# Patient Record
Sex: Female | Born: 1940 | Race: White | Hispanic: No | Smoking: Never smoker
Health system: Southern US, Community
[De-identification: ages and names within clinical notes are randomized; demographics above are authoritative.]

## PROBLEM LIST (undated history)

## (undated) DIAGNOSIS — R112 Nausea with vomiting, unspecified: Secondary | ICD-10-CM

## (undated) DIAGNOSIS — T4145XA Adverse effect of unspecified anesthetic, initial encounter: Secondary | ICD-10-CM

## (undated) DIAGNOSIS — Z8489 Family history of other specified conditions: Secondary | ICD-10-CM

## (undated) DIAGNOSIS — C541 Malignant neoplasm of endometrium: Secondary | ICD-10-CM

## (undated) DIAGNOSIS — E039 Hypothyroidism, unspecified: Secondary | ICD-10-CM

## (undated) DIAGNOSIS — E079 Disorder of thyroid, unspecified: Secondary | ICD-10-CM

## (undated) DIAGNOSIS — T8859XA Other complications of anesthesia, initial encounter: Secondary | ICD-10-CM

## (undated) DIAGNOSIS — G709 Myoneural disorder, unspecified: Secondary | ICD-10-CM

## (undated) DIAGNOSIS — C801 Malignant (primary) neoplasm, unspecified: Secondary | ICD-10-CM

## (undated) DIAGNOSIS — Z9889 Other specified postprocedural states: Secondary | ICD-10-CM

## (undated) DIAGNOSIS — K219 Gastro-esophageal reflux disease without esophagitis: Secondary | ICD-10-CM

## (undated) DIAGNOSIS — M1990 Unspecified osteoarthritis, unspecified site: Secondary | ICD-10-CM

## (undated) DIAGNOSIS — H409 Unspecified glaucoma: Secondary | ICD-10-CM

## (undated) HISTORY — PX: CHOLECYSTECTOMY: SHX55

## (undated) HISTORY — PX: ABDOMINAL HYSTERECTOMY: SHX81

## (undated) HISTORY — DX: Unspecified glaucoma: H40.9

## (undated) HISTORY — PX: BREAST EXCISIONAL BIOPSY: SUR124

## (undated) HISTORY — PX: APPENDECTOMY: SHX54

## (undated) HISTORY — DX: Disorder of thyroid, unspecified: E07.9

## (undated) HISTORY — PX: BREAST SURGERY: SHX581

## (undated) HISTORY — DX: Unspecified osteoarthritis, unspecified site: M19.90

## (undated) HISTORY — DX: Malignant (primary) neoplasm, unspecified: C80.1

---
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
## 2001-02-05 HISTORY — PX: ROTATOR CUFF REPAIR: SHX139

## 2013-06-05 HISTORY — PX: FEMUR SURGERY: SHX943

## 2014-09-29 ENCOUNTER — Encounter: Payer: Self-pay | Admitting: Family Medicine

## 2014-12-13 ENCOUNTER — Encounter: Payer: Self-pay | Admitting: Family Medicine

## 2015-02-21 DIAGNOSIS — I10 Essential (primary) hypertension: Secondary | ICD-10-CM | POA: Diagnosis not present

## 2015-03-24 DIAGNOSIS — M79675 Pain in left toe(s): Secondary | ICD-10-CM | POA: Diagnosis not present

## 2015-03-24 DIAGNOSIS — I70203 Unspecified atherosclerosis of native arteries of extremities, bilateral legs: Secondary | ICD-10-CM | POA: Diagnosis not present

## 2015-03-24 DIAGNOSIS — L84 Corns and callosities: Secondary | ICD-10-CM | POA: Diagnosis not present

## 2015-03-24 DIAGNOSIS — L600 Ingrowing nail: Secondary | ICD-10-CM | POA: Diagnosis not present

## 2015-03-24 DIAGNOSIS — M79674 Pain in right toe(s): Secondary | ICD-10-CM | POA: Diagnosis not present

## 2015-03-24 DIAGNOSIS — L03032 Cellulitis of left toe: Secondary | ICD-10-CM | POA: Diagnosis not present

## 2015-03-24 DIAGNOSIS — B351 Tinea unguium: Secondary | ICD-10-CM | POA: Diagnosis not present

## 2015-05-19 DIAGNOSIS — K1370 Unspecified lesions of oral mucosa: Secondary | ICD-10-CM | POA: Diagnosis not present

## 2015-05-24 DIAGNOSIS — Z9181 History of falling: Secondary | ICD-10-CM | POA: Diagnosis not present

## 2015-05-24 DIAGNOSIS — E785 Hyperlipidemia, unspecified: Secondary | ICD-10-CM | POA: Diagnosis not present

## 2015-05-24 DIAGNOSIS — R197 Diarrhea, unspecified: Secondary | ICD-10-CM | POA: Diagnosis not present

## 2015-05-24 DIAGNOSIS — I10 Essential (primary) hypertension: Secondary | ICD-10-CM | POA: Diagnosis not present

## 2015-05-26 ENCOUNTER — Encounter: Payer: Self-pay | Admitting: Family Medicine

## 2015-05-26 DIAGNOSIS — L84 Corns and callosities: Secondary | ICD-10-CM | POA: Diagnosis not present

## 2015-05-26 DIAGNOSIS — B351 Tinea unguium: Secondary | ICD-10-CM | POA: Diagnosis not present

## 2015-05-26 DIAGNOSIS — I70203 Unspecified atherosclerosis of native arteries of extremities, bilateral legs: Secondary | ICD-10-CM | POA: Diagnosis not present

## 2015-05-26 DIAGNOSIS — M79675 Pain in left toe(s): Secondary | ICD-10-CM | POA: Diagnosis not present

## 2015-05-26 DIAGNOSIS — M79674 Pain in right toe(s): Secondary | ICD-10-CM | POA: Diagnosis not present

## 2015-05-26 DIAGNOSIS — L03032 Cellulitis of left toe: Secondary | ICD-10-CM | POA: Diagnosis not present

## 2015-05-26 DIAGNOSIS — L600 Ingrowing nail: Secondary | ICD-10-CM | POA: Diagnosis not present

## 2015-05-26 DIAGNOSIS — I10 Essential (primary) hypertension: Secondary | ICD-10-CM | POA: Diagnosis not present

## 2015-06-02 DIAGNOSIS — H401132 Primary open-angle glaucoma, bilateral, moderate stage: Secondary | ICD-10-CM | POA: Diagnosis not present

## 2015-07-08 DIAGNOSIS — K1321 Leukoplakia of oral mucosa, including tongue: Secondary | ICD-10-CM | POA: Diagnosis not present

## 2015-07-08 DIAGNOSIS — L439 Lichen planus, unspecified: Secondary | ICD-10-CM | POA: Diagnosis not present

## 2015-07-14 DIAGNOSIS — Y842 Radiological procedure and radiotherapy as the cause of abnormal reaction of the patient, or of later complication, without mention of misadventure at the time of the procedure: Secondary | ICD-10-CM | POA: Diagnosis not present

## 2015-07-14 DIAGNOSIS — N952 Postmenopausal atrophic vaginitis: Secondary | ICD-10-CM | POA: Diagnosis not present

## 2015-07-14 DIAGNOSIS — Z8542 Personal history of malignant neoplasm of other parts of uterus: Secondary | ICD-10-CM | POA: Diagnosis not present

## 2015-07-14 DIAGNOSIS — N393 Stress incontinence (female) (male): Secondary | ICD-10-CM | POA: Diagnosis not present

## 2015-07-18 DIAGNOSIS — Z803 Family history of malignant neoplasm of breast: Secondary | ICD-10-CM | POA: Diagnosis not present

## 2015-07-18 DIAGNOSIS — Z1231 Encounter for screening mammogram for malignant neoplasm of breast: Secondary | ICD-10-CM | POA: Diagnosis not present

## 2015-07-29 DIAGNOSIS — Z13820 Encounter for screening for osteoporosis: Secondary | ICD-10-CM | POA: Diagnosis not present

## 2015-07-29 DIAGNOSIS — M810 Age-related osteoporosis without current pathological fracture: Secondary | ICD-10-CM | POA: Diagnosis not present

## 2015-08-02 DIAGNOSIS — L03032 Cellulitis of left toe: Secondary | ICD-10-CM | POA: Diagnosis not present

## 2015-08-02 DIAGNOSIS — L84 Corns and callosities: Secondary | ICD-10-CM | POA: Diagnosis not present

## 2015-08-02 DIAGNOSIS — I70203 Unspecified atherosclerosis of native arteries of extremities, bilateral legs: Secondary | ICD-10-CM | POA: Diagnosis not present

## 2015-08-02 DIAGNOSIS — B351 Tinea unguium: Secondary | ICD-10-CM | POA: Diagnosis not present

## 2015-08-02 DIAGNOSIS — M79675 Pain in left toe(s): Secondary | ICD-10-CM | POA: Diagnosis not present

## 2015-08-02 DIAGNOSIS — L600 Ingrowing nail: Secondary | ICD-10-CM | POA: Diagnosis not present

## 2015-08-02 DIAGNOSIS — M79674 Pain in right toe(s): Secondary | ICD-10-CM | POA: Diagnosis not present

## 2015-08-16 ENCOUNTER — Encounter: Payer: Self-pay | Admitting: Family Medicine

## 2015-08-16 DIAGNOSIS — C569 Malignant neoplasm of unspecified ovary: Secondary | ICD-10-CM | POA: Diagnosis not present

## 2015-08-16 DIAGNOSIS — E785 Hyperlipidemia, unspecified: Secondary | ICD-10-CM | POA: Diagnosis not present

## 2015-08-16 DIAGNOSIS — E039 Hypothyroidism, unspecified: Secondary | ICD-10-CM | POA: Diagnosis not present

## 2015-08-23 DIAGNOSIS — E039 Hypothyroidism, unspecified: Secondary | ICD-10-CM | POA: Diagnosis not present

## 2015-08-23 DIAGNOSIS — E785 Hyperlipidemia, unspecified: Secondary | ICD-10-CM | POA: Diagnosis not present

## 2015-08-23 DIAGNOSIS — I10 Essential (primary) hypertension: Secondary | ICD-10-CM | POA: Diagnosis not present

## 2015-09-07 DIAGNOSIS — Z23 Encounter for immunization: Secondary | ICD-10-CM | POA: Diagnosis not present

## 2015-10-04 DIAGNOSIS — M79674 Pain in right toe(s): Secondary | ICD-10-CM | POA: Diagnosis not present

## 2015-10-04 DIAGNOSIS — L600 Ingrowing nail: Secondary | ICD-10-CM | POA: Diagnosis not present

## 2015-10-04 DIAGNOSIS — I70203 Unspecified atherosclerosis of native arteries of extremities, bilateral legs: Secondary | ICD-10-CM | POA: Diagnosis not present

## 2015-10-04 DIAGNOSIS — L84 Corns and callosities: Secondary | ICD-10-CM | POA: Diagnosis not present

## 2015-10-04 DIAGNOSIS — B351 Tinea unguium: Secondary | ICD-10-CM | POA: Diagnosis not present

## 2015-10-04 DIAGNOSIS — M79675 Pain in left toe(s): Secondary | ICD-10-CM | POA: Diagnosis not present

## 2015-10-04 DIAGNOSIS — L03032 Cellulitis of left toe: Secondary | ICD-10-CM | POA: Diagnosis not present

## 2015-10-19 DIAGNOSIS — M1711 Unilateral primary osteoarthritis, right knee: Secondary | ICD-10-CM | POA: Diagnosis not present

## 2015-10-26 DIAGNOSIS — M170 Bilateral primary osteoarthritis of knee: Secondary | ICD-10-CM | POA: Diagnosis not present

## 2015-11-02 DIAGNOSIS — M1711 Unilateral primary osteoarthritis, right knee: Secondary | ICD-10-CM | POA: Diagnosis not present

## 2015-11-04 DIAGNOSIS — C541 Malignant neoplasm of endometrium: Secondary | ICD-10-CM | POA: Diagnosis not present

## 2015-11-04 DIAGNOSIS — Z8542 Personal history of malignant neoplasm of other parts of uterus: Secondary | ICD-10-CM | POA: Diagnosis not present

## 2015-11-17 DIAGNOSIS — H524 Presbyopia: Secondary | ICD-10-CM | POA: Diagnosis not present

## 2015-11-17 DIAGNOSIS — H401132 Primary open-angle glaucoma, bilateral, moderate stage: Secondary | ICD-10-CM | POA: Diagnosis not present

## 2015-11-17 DIAGNOSIS — H2513 Age-related nuclear cataract, bilateral: Secondary | ICD-10-CM | POA: Diagnosis not present

## 2015-11-23 ENCOUNTER — Encounter: Payer: Self-pay | Admitting: Family Medicine

## 2015-11-23 DIAGNOSIS — I10 Essential (primary) hypertension: Secondary | ICD-10-CM | POA: Diagnosis not present

## 2015-11-30 DIAGNOSIS — M545 Low back pain: Secondary | ICD-10-CM | POA: Diagnosis not present

## 2015-11-30 DIAGNOSIS — E039 Hypothyroidism, unspecified: Secondary | ICD-10-CM | POA: Diagnosis not present

## 2015-11-30 DIAGNOSIS — Z8543 Personal history of malignant neoplasm of ovary: Secondary | ICD-10-CM | POA: Diagnosis not present

## 2015-11-30 DIAGNOSIS — E785 Hyperlipidemia, unspecified: Secondary | ICD-10-CM | POA: Diagnosis not present

## 2015-11-30 DIAGNOSIS — I10 Essential (primary) hypertension: Secondary | ICD-10-CM | POA: Diagnosis not present

## 2015-12-06 DIAGNOSIS — L84 Corns and callosities: Secondary | ICD-10-CM | POA: Diagnosis not present

## 2015-12-06 DIAGNOSIS — M79675 Pain in left toe(s): Secondary | ICD-10-CM | POA: Diagnosis not present

## 2015-12-06 DIAGNOSIS — L600 Ingrowing nail: Secondary | ICD-10-CM | POA: Diagnosis not present

## 2015-12-06 DIAGNOSIS — L03032 Cellulitis of left toe: Secondary | ICD-10-CM | POA: Diagnosis not present

## 2015-12-06 DIAGNOSIS — I70203 Unspecified atherosclerosis of native arteries of extremities, bilateral legs: Secondary | ICD-10-CM | POA: Diagnosis not present

## 2015-12-06 DIAGNOSIS — B351 Tinea unguium: Secondary | ICD-10-CM | POA: Diagnosis not present

## 2015-12-06 DIAGNOSIS — M79674 Pain in right toe(s): Secondary | ICD-10-CM | POA: Diagnosis not present

## 2015-12-13 DIAGNOSIS — H524 Presbyopia: Secondary | ICD-10-CM | POA: Diagnosis not present

## 2015-12-13 DIAGNOSIS — H2513 Age-related nuclear cataract, bilateral: Secondary | ICD-10-CM | POA: Diagnosis not present

## 2015-12-13 DIAGNOSIS — H401133 Primary open-angle glaucoma, bilateral, severe stage: Secondary | ICD-10-CM | POA: Diagnosis not present

## 2016-01-03 DIAGNOSIS — L439 Lichen planus, unspecified: Secondary | ICD-10-CM | POA: Diagnosis not present

## 2016-01-03 DIAGNOSIS — K1321 Leukoplakia of oral mucosa, including tongue: Secondary | ICD-10-CM | POA: Diagnosis not present

## 2016-02-14 DIAGNOSIS — L84 Corns and callosities: Secondary | ICD-10-CM | POA: Diagnosis not present

## 2016-02-14 DIAGNOSIS — I70203 Unspecified atherosclerosis of native arteries of extremities, bilateral legs: Secondary | ICD-10-CM | POA: Diagnosis not present

## 2016-02-14 DIAGNOSIS — L03032 Cellulitis of left toe: Secondary | ICD-10-CM | POA: Diagnosis not present

## 2016-02-14 DIAGNOSIS — M79674 Pain in right toe(s): Secondary | ICD-10-CM | POA: Diagnosis not present

## 2016-02-14 DIAGNOSIS — B351 Tinea unguium: Secondary | ICD-10-CM | POA: Diagnosis not present

## 2016-02-14 DIAGNOSIS — M79675 Pain in left toe(s): Secondary | ICD-10-CM | POA: Diagnosis not present

## 2016-02-14 DIAGNOSIS — L600 Ingrowing nail: Secondary | ICD-10-CM | POA: Diagnosis not present

## 2016-02-23 ENCOUNTER — Encounter: Payer: Self-pay | Admitting: Family Medicine

## 2016-02-23 DIAGNOSIS — I10 Essential (primary) hypertension: Secondary | ICD-10-CM | POA: Diagnosis not present

## 2016-03-01 ENCOUNTER — Encounter: Payer: Self-pay | Admitting: Family Medicine

## 2016-03-01 DIAGNOSIS — I10 Essential (primary) hypertension: Secondary | ICD-10-CM | POA: Diagnosis not present

## 2016-03-01 DIAGNOSIS — M546 Pain in thoracic spine: Secondary | ICD-10-CM | POA: Diagnosis not present

## 2016-03-01 DIAGNOSIS — Z8543 Personal history of malignant neoplasm of ovary: Secondary | ICD-10-CM | POA: Diagnosis not present

## 2016-03-01 DIAGNOSIS — M545 Low back pain: Secondary | ICD-10-CM | POA: Diagnosis not present

## 2016-03-01 DIAGNOSIS — E785 Hyperlipidemia, unspecified: Secondary | ICD-10-CM | POA: Diagnosis not present

## 2016-03-01 DIAGNOSIS — M542 Cervicalgia: Secondary | ICD-10-CM | POA: Diagnosis not present

## 2016-03-01 DIAGNOSIS — M4313 Spondylolisthesis, cervicothoracic region: Secondary | ICD-10-CM | POA: Diagnosis not present

## 2016-03-01 DIAGNOSIS — M5185 Other intervertebral disc disorders, thoracolumbar region: Secondary | ICD-10-CM | POA: Diagnosis not present

## 2016-03-01 DIAGNOSIS — E039 Hypothyroidism, unspecified: Secondary | ICD-10-CM | POA: Diagnosis not present

## 2016-03-01 DIAGNOSIS — M549 Dorsalgia, unspecified: Secondary | ICD-10-CM | POA: Diagnosis not present

## 2016-04-17 DIAGNOSIS — L84 Corns and callosities: Secondary | ICD-10-CM | POA: Diagnosis not present

## 2016-04-17 DIAGNOSIS — I70203 Unspecified atherosclerosis of native arteries of extremities, bilateral legs: Secondary | ICD-10-CM | POA: Diagnosis not present

## 2016-04-17 DIAGNOSIS — L600 Ingrowing nail: Secondary | ICD-10-CM | POA: Diagnosis not present

## 2016-04-17 DIAGNOSIS — L03032 Cellulitis of left toe: Secondary | ICD-10-CM | POA: Diagnosis not present

## 2016-04-17 DIAGNOSIS — M79674 Pain in right toe(s): Secondary | ICD-10-CM | POA: Diagnosis not present

## 2016-04-17 DIAGNOSIS — B351 Tinea unguium: Secondary | ICD-10-CM | POA: Diagnosis not present

## 2016-04-17 DIAGNOSIS — M79675 Pain in left toe(s): Secondary | ICD-10-CM | POA: Diagnosis not present

## 2016-04-23 DIAGNOSIS — M1711 Unilateral primary osteoarthritis, right knee: Secondary | ICD-10-CM | POA: Diagnosis not present

## 2016-05-30 ENCOUNTER — Encounter: Payer: Self-pay | Admitting: Family Medicine

## 2016-05-30 DIAGNOSIS — Z8543 Personal history of malignant neoplasm of ovary: Secondary | ICD-10-CM | POA: Diagnosis not present

## 2016-05-30 DIAGNOSIS — I10 Essential (primary) hypertension: Secondary | ICD-10-CM | POA: Diagnosis not present

## 2016-06-06 ENCOUNTER — Encounter: Payer: Self-pay | Admitting: Family Medicine

## 2016-06-06 DIAGNOSIS — E039 Hypothyroidism, unspecified: Secondary | ICD-10-CM | POA: Diagnosis not present

## 2016-06-06 DIAGNOSIS — E785 Hyperlipidemia, unspecified: Secondary | ICD-10-CM | POA: Diagnosis not present

## 2016-06-06 DIAGNOSIS — Z8543 Personal history of malignant neoplasm of ovary: Secondary | ICD-10-CM | POA: Diagnosis not present

## 2016-06-06 DIAGNOSIS — I10 Essential (primary) hypertension: Secondary | ICD-10-CM | POA: Diagnosis not present

## 2016-06-06 DIAGNOSIS — R079 Chest pain, unspecified: Secondary | ICD-10-CM | POA: Diagnosis not present

## 2016-06-06 DIAGNOSIS — Z9181 History of falling: Secondary | ICD-10-CM | POA: Diagnosis not present

## 2016-06-14 DIAGNOSIS — H401133 Primary open-angle glaucoma, bilateral, severe stage: Secondary | ICD-10-CM | POA: Diagnosis not present

## 2016-06-21 DIAGNOSIS — Z23 Encounter for immunization: Secondary | ICD-10-CM | POA: Diagnosis not present

## 2016-06-26 DIAGNOSIS — L84 Corns and callosities: Secondary | ICD-10-CM | POA: Diagnosis not present

## 2016-06-26 DIAGNOSIS — M79674 Pain in right toe(s): Secondary | ICD-10-CM | POA: Diagnosis not present

## 2016-06-26 DIAGNOSIS — L03032 Cellulitis of left toe: Secondary | ICD-10-CM | POA: Diagnosis not present

## 2016-06-26 DIAGNOSIS — B351 Tinea unguium: Secondary | ICD-10-CM | POA: Diagnosis not present

## 2016-06-26 DIAGNOSIS — M79675 Pain in left toe(s): Secondary | ICD-10-CM | POA: Diagnosis not present

## 2016-06-26 DIAGNOSIS — L600 Ingrowing nail: Secondary | ICD-10-CM | POA: Diagnosis not present

## 2016-06-26 DIAGNOSIS — I70203 Unspecified atherosclerosis of native arteries of extremities, bilateral legs: Secondary | ICD-10-CM | POA: Diagnosis not present

## 2016-07-05 DIAGNOSIS — Z8542 Personal history of malignant neoplasm of other parts of uterus: Secondary | ICD-10-CM | POA: Diagnosis not present

## 2016-07-05 DIAGNOSIS — N952 Postmenopausal atrophic vaginitis: Secondary | ICD-10-CM | POA: Diagnosis not present

## 2016-07-05 DIAGNOSIS — N393 Stress incontinence (female) (male): Secondary | ICD-10-CM | POA: Diagnosis not present

## 2016-07-23 ENCOUNTER — Encounter: Payer: Self-pay | Admitting: Obstetrics & Gynecology

## 2016-07-23 DIAGNOSIS — R922 Inconclusive mammogram: Secondary | ICD-10-CM | POA: Diagnosis not present

## 2016-07-23 DIAGNOSIS — Z1231 Encounter for screening mammogram for malignant neoplasm of breast: Secondary | ICD-10-CM | POA: Diagnosis not present

## 2016-08-16 ENCOUNTER — Encounter: Payer: Self-pay | Admitting: Family Medicine

## 2016-08-16 DIAGNOSIS — M5134 Other intervertebral disc degeneration, thoracic region: Secondary | ICD-10-CM | POA: Diagnosis not present

## 2016-08-16 DIAGNOSIS — M48061 Spinal stenosis, lumbar region without neurogenic claudication: Secondary | ICD-10-CM | POA: Diagnosis not present

## 2016-08-16 DIAGNOSIS — M5186 Other intervertebral disc disorders, lumbar region: Secondary | ICD-10-CM | POA: Diagnosis not present

## 2016-08-16 DIAGNOSIS — M419 Scoliosis, unspecified: Secondary | ICD-10-CM | POA: Diagnosis not present

## 2016-08-16 DIAGNOSIS — M4802 Spinal stenosis, cervical region: Secondary | ICD-10-CM | POA: Diagnosis not present

## 2016-08-16 DIAGNOSIS — M5124 Other intervertebral disc displacement, thoracic region: Secondary | ICD-10-CM | POA: Diagnosis not present

## 2016-08-16 DIAGNOSIS — M47816 Spondylosis without myelopathy or radiculopathy, lumbar region: Secondary | ICD-10-CM | POA: Diagnosis not present

## 2016-08-28 DIAGNOSIS — M79675 Pain in left toe(s): Secondary | ICD-10-CM | POA: Diagnosis not present

## 2016-08-28 DIAGNOSIS — L600 Ingrowing nail: Secondary | ICD-10-CM | POA: Diagnosis not present

## 2016-08-28 DIAGNOSIS — L03032 Cellulitis of left toe: Secondary | ICD-10-CM | POA: Diagnosis not present

## 2016-08-28 DIAGNOSIS — B351 Tinea unguium: Secondary | ICD-10-CM | POA: Diagnosis not present

## 2016-08-28 DIAGNOSIS — I70203 Unspecified atherosclerosis of native arteries of extremities, bilateral legs: Secondary | ICD-10-CM | POA: Diagnosis not present

## 2016-08-28 DIAGNOSIS — L84 Corns and callosities: Secondary | ICD-10-CM | POA: Diagnosis not present

## 2016-08-28 DIAGNOSIS — M79674 Pain in right toe(s): Secondary | ICD-10-CM | POA: Diagnosis not present

## 2016-09-11 ENCOUNTER — Encounter: Payer: Self-pay | Admitting: Family Medicine

## 2016-09-11 DIAGNOSIS — Z8543 Personal history of malignant neoplasm of ovary: Secondary | ICD-10-CM | POA: Diagnosis not present

## 2016-09-11 DIAGNOSIS — I10 Essential (primary) hypertension: Secondary | ICD-10-CM | POA: Diagnosis not present

## 2016-09-13 DIAGNOSIS — H524 Presbyopia: Secondary | ICD-10-CM | POA: Diagnosis not present

## 2016-09-13 DIAGNOSIS — H401133 Primary open-angle glaucoma, bilateral, severe stage: Secondary | ICD-10-CM | POA: Diagnosis not present

## 2016-09-25 DIAGNOSIS — M542 Cervicalgia: Secondary | ICD-10-CM | POA: Diagnosis not present

## 2016-09-25 DIAGNOSIS — M546 Pain in thoracic spine: Secondary | ICD-10-CM | POA: Diagnosis not present

## 2016-09-28 DIAGNOSIS — Z23 Encounter for immunization: Secondary | ICD-10-CM | POA: Diagnosis not present

## 2016-09-29 DIAGNOSIS — R21 Rash and other nonspecific skin eruption: Secondary | ICD-10-CM | POA: Diagnosis not present

## 2016-10-01 DIAGNOSIS — M542 Cervicalgia: Secondary | ICD-10-CM | POA: Diagnosis not present

## 2016-10-03 DIAGNOSIS — M542 Cervicalgia: Secondary | ICD-10-CM | POA: Diagnosis not present

## 2016-10-09 DIAGNOSIS — M542 Cervicalgia: Secondary | ICD-10-CM | POA: Diagnosis not present

## 2016-10-11 DIAGNOSIS — M542 Cervicalgia: Secondary | ICD-10-CM | POA: Diagnosis not present

## 2016-10-16 DIAGNOSIS — M542 Cervicalgia: Secondary | ICD-10-CM | POA: Diagnosis not present

## 2016-10-18 DIAGNOSIS — M542 Cervicalgia: Secondary | ICD-10-CM | POA: Diagnosis not present

## 2016-10-22 DIAGNOSIS — M542 Cervicalgia: Secondary | ICD-10-CM | POA: Diagnosis not present

## 2016-10-30 DIAGNOSIS — M79675 Pain in left toe(s): Secondary | ICD-10-CM | POA: Diagnosis not present

## 2016-10-30 DIAGNOSIS — L03032 Cellulitis of left toe: Secondary | ICD-10-CM | POA: Diagnosis not present

## 2016-10-30 DIAGNOSIS — M79674 Pain in right toe(s): Secondary | ICD-10-CM | POA: Diagnosis not present

## 2016-10-30 DIAGNOSIS — L600 Ingrowing nail: Secondary | ICD-10-CM | POA: Diagnosis not present

## 2016-10-30 DIAGNOSIS — L84 Corns and callosities: Secondary | ICD-10-CM | POA: Diagnosis not present

## 2016-10-30 DIAGNOSIS — I70203 Unspecified atherosclerosis of native arteries of extremities, bilateral legs: Secondary | ICD-10-CM | POA: Diagnosis not present

## 2016-10-30 DIAGNOSIS — B351 Tinea unguium: Secondary | ICD-10-CM | POA: Diagnosis not present

## 2016-10-30 DIAGNOSIS — M542 Cervicalgia: Secondary | ICD-10-CM | POA: Diagnosis not present

## 2016-11-01 DIAGNOSIS — M542 Cervicalgia: Secondary | ICD-10-CM | POA: Diagnosis not present

## 2016-11-06 DIAGNOSIS — M542 Cervicalgia: Secondary | ICD-10-CM | POA: Diagnosis not present

## 2016-11-08 DIAGNOSIS — M542 Cervicalgia: Secondary | ICD-10-CM | POA: Diagnosis not present

## 2016-11-12 DIAGNOSIS — M542 Cervicalgia: Secondary | ICD-10-CM | POA: Diagnosis not present

## 2016-11-14 DIAGNOSIS — M542 Cervicalgia: Secondary | ICD-10-CM | POA: Diagnosis not present

## 2016-11-14 DIAGNOSIS — M1711 Unilateral primary osteoarthritis, right knee: Secondary | ICD-10-CM | POA: Diagnosis not present

## 2016-11-14 DIAGNOSIS — M25571 Pain in right ankle and joints of right foot: Secondary | ICD-10-CM | POA: Diagnosis not present

## 2016-11-19 DIAGNOSIS — M542 Cervicalgia: Secondary | ICD-10-CM | POA: Diagnosis not present

## 2016-11-21 DIAGNOSIS — M542 Cervicalgia: Secondary | ICD-10-CM | POA: Diagnosis not present

## 2016-11-26 DIAGNOSIS — M542 Cervicalgia: Secondary | ICD-10-CM | POA: Diagnosis not present

## 2016-11-28 ENCOUNTER — Encounter: Payer: Self-pay | Admitting: Family Medicine

## 2016-11-28 DIAGNOSIS — M542 Cervicalgia: Secondary | ICD-10-CM | POA: Diagnosis not present

## 2016-11-28 DIAGNOSIS — C569 Malignant neoplasm of unspecified ovary: Secondary | ICD-10-CM | POA: Diagnosis not present

## 2016-11-28 DIAGNOSIS — E785 Hyperlipidemia, unspecified: Secondary | ICD-10-CM | POA: Diagnosis not present

## 2016-11-28 DIAGNOSIS — E039 Hypothyroidism, unspecified: Secondary | ICD-10-CM | POA: Diagnosis not present

## 2016-12-04 DIAGNOSIS — M542 Cervicalgia: Secondary | ICD-10-CM | POA: Diagnosis not present

## 2016-12-05 DIAGNOSIS — E785 Hyperlipidemia, unspecified: Secondary | ICD-10-CM | POA: Diagnosis not present

## 2016-12-05 DIAGNOSIS — Z6826 Body mass index (BMI) 26.0-26.9, adult: Secondary | ICD-10-CM | POA: Diagnosis not present

## 2016-12-05 DIAGNOSIS — E039 Hypothyroidism, unspecified: Secondary | ICD-10-CM | POA: Diagnosis not present

## 2016-12-05 DIAGNOSIS — I10 Essential (primary) hypertension: Secondary | ICD-10-CM | POA: Diagnosis not present

## 2016-12-06 DIAGNOSIS — M542 Cervicalgia: Secondary | ICD-10-CM | POA: Diagnosis not present

## 2017-01-10 ENCOUNTER — Ambulatory Visit: Payer: Self-pay | Admitting: Family Medicine

## 2017-01-14 ENCOUNTER — Ambulatory Visit: Payer: Self-pay | Admitting: Family Medicine

## 2017-01-23 ENCOUNTER — Encounter: Payer: Self-pay | Admitting: Family Medicine

## 2017-01-23 ENCOUNTER — Ambulatory Visit (INDEPENDENT_AMBULATORY_CARE_PROVIDER_SITE_OTHER): Payer: Medicare Other | Admitting: Family Medicine

## 2017-01-23 VITALS — BP 102/78 | HR 68 | Temp 97.7°F | Ht 60.0 in | Wt 143.4 lb

## 2017-01-23 DIAGNOSIS — Z8542 Personal history of malignant neoplasm of other parts of uterus: Secondary | ICD-10-CM | POA: Insufficient documentation

## 2017-01-23 DIAGNOSIS — E039 Hypothyroidism, unspecified: Secondary | ICD-10-CM

## 2017-01-23 DIAGNOSIS — M810 Age-related osteoporosis without current pathological fracture: Secondary | ICD-10-CM

## 2017-01-23 DIAGNOSIS — I952 Hypotension due to drugs: Secondary | ICD-10-CM

## 2017-01-23 MED ORDER — ALENDRONATE SODIUM 70 MG PO TABS: 70.0000 mg | ORAL_TABLET | ORAL | 11 refills | Status: DC

## 2017-01-23 NOTE — Progress Notes (Signed)
Subjective:  Patient ID: Katie [name], female    DOB: 1940-05-22  Age: 76 y.o. MRN: [id number]  CC: Establish Care   HPI Katie [name] presents for establishment of care.  She had a DEXA scan earlier this year that did show osteoporosis.  She has fallen in the past and broken her left femur and has a sliding screw.  She is not taking a bisphosphonate, calcium or vitamin D.  She was placed on spironolactone a marked tone in the past for swelling in her ankles.  She admits that when she stands she often gets lightheaded.  Her blood pressure typically runs in the low teens over 70s.  She has no history of hypertension.  She has an endometrial cancer survivor.  Her last CA 125 was low normal at 11.  History Katie [name] has a past medical history of Arthritis, Cancer (Katie [name]), Glaucoma, and Thyroid disease.   She has a past surgical history that includes Cholecystectomy; Abdominal hysterectomy; Appendectomy; and Breast surgery.   Her family history includes Cancer in her paternal grandfather and sister; Hearing loss in her maternal grandfather and maternal grandmother; Heart disease in her father.She reports that  has never smoked. she has never used smokeless tobacco. She reports that she does not drink alcohol or use drugs.  Outpatient Medications Prior to Visit  Medication Sig Dispense Refill  . bimatoprost (LUMIGAN) 0.01 % SOLN 1 drop at bedtime.    Marland Kitchen BIOTIN PO Take 1 tablet by mouth daily.    . brimonidine (ALPHAGAN P) 0.1 % SOLN Use in the morning and evening.    Marland Kitchen esomeprazole (NEXIUM) 20 MG capsule Take 20 mg by mouth daily at 12 noon.    Marland Kitchen levothyroxine (SYNTHROID, LEVOTHROID) 50 MCG tablet Take 50 mcg by mouth daily before breakfast.    . pyridOXINE (VITAMIN B-6) 100 MG tablet Take 100 mg by mouth daily.    Marland Kitchen spironolactone (ALDACTONE) 25 MG tablet Take 25 mg by mouth daily.     No facility-administered medications prior to visit.     ROS Review of Systems  Constitutional:  Negative.   HENT: Negative.   Eyes: Negative.   Respiratory: Negative.   Cardiovascular: Negative.   Gastrointestinal: Negative.   Endocrine: Negative for polyphagia and polyuria.  Genitourinary: Negative.   Musculoskeletal: Negative.  Negative for arthralgias and gait problem.  Skin: Negative for rash.  Allergic/Immunologic: Negative for immunocompromised state.  Neurological: Negative for weakness, light-headedness and headaches.  Hematological: Does not bruise/bleed easily.  Psychiatric/Behavioral: Negative for dysphoric mood.    Objective:  BP 102/78 (BP Location: Right Arm, Patient Position: Sitting, Cuff Size: Normal)   Pulse 68   Temp 97.7 F (36.5 C) (Oral)   Ht 5' (1.524 m)   Wt 143 lb 6 oz (65 kg)   SpO2 98%   BMI 28.00 kg/m   Physical Exam  Constitutional: She is oriented to person, place, and time. She appears well-developed and well-nourished. No distress.  HENT:  Head: Normocephalic and atraumatic.  Right Ear: External ear normal.  Left Ear: External ear normal.  Mouth/Throat: Oropharynx is clear and moist. No oropharyngeal exudate.  Eyes: Conjunctivae are normal. Pupils are equal, round, and reactive to light. Right eye exhibits no discharge. Left eye exhibits no discharge. No scleral icterus.  Neck: Normal range of motion. Neck supple. No JVD present. No tracheal deviation present. No thyromegaly present.  Cardiovascular: Normal rate, regular rhythm and normal heart sounds.  Pulmonary/Chest: Effort normal and breath sounds normal. No stridor.  Abdominal: Bowel sounds are normal.  Lymphadenopathy:    She has no cervical adenopathy.  Neurological: She is alert and oriented to person, place, and time.  Skin: Skin is warm and dry. She is not diaphoretic.  Psychiatric: She has a normal mood and affect. Her behavior is normal.      Assessment & Plan:   Katie [name] was seen today for establish care.  Diagnoses and all orders for this visit:  Hypotension due  to drugs  Age-related osteoporosis without current pathological fracture -     alendronate (FOSAMAX) 70 MG tablet; Take 1 tablet (70 mg total) by mouth every 7 (seven) days. Take with a full glass of water on an empty stomach.  Acquired hypothyroidism  History of endometrial cancer   I have discontinued Katie [name]'s spironolactone. I am also having her start on alendronate. Additionally, I am having her maintain her brimonidine, bimatoprost, levothyroxine, esomeprazole, pyridOXINE, and BIOTIN PO.  Meds ordered this encounter  Medications  . alendronate (FOSAMAX) 70 MG tablet    Sig: Take 1 tablet (70 mg total) by mouth every 7 (seven) days. Take with a full glass of water on an empty stomach.    Dispense:  4 tablet    Refill:  11   She will start the past Fosamax and restart calcium citrate.  We discussed the potential side effect of osteonecrosis of the jaw.  She is not worried about this is there are multiple missing teeth.  We both agreed that we need to do everything we can to prevent her falling from breaking her hip.  She will stop the spironolactone.  She will follow-up in 1 month for recheck of her blood pressure.  Follow-up: Return in about 1 month (around 02/23/2017).  Libby Maw, MD

## 2017-02-25 ENCOUNTER — Ambulatory Visit (INDEPENDENT_AMBULATORY_CARE_PROVIDER_SITE_OTHER): Payer: Medicare Other | Admitting: Family Medicine

## 2017-02-25 ENCOUNTER — Ambulatory Visit: Payer: Medicare Other | Admitting: Family Medicine

## 2017-02-25 ENCOUNTER — Encounter: Payer: Self-pay | Admitting: Family Medicine

## 2017-02-25 VITALS — BP 120/62 | HR 67 | Ht 60.0 in | Wt 147.1 lb

## 2017-02-25 DIAGNOSIS — Z8542 Personal history of malignant neoplasm of other parts of uterus: Secondary | ICD-10-CM | POA: Diagnosis not present

## 2017-02-25 DIAGNOSIS — M810 Age-related osteoporosis without current pathological fracture: Secondary | ICD-10-CM | POA: Diagnosis not present

## 2017-02-25 DIAGNOSIS — E039 Hypothyroidism, unspecified: Secondary | ICD-10-CM

## 2017-02-25 LAB — TSH: TSH: 3.61 u[IU]/mL (ref 0.35–4.50)

## 2017-02-25 MED ORDER — ALENDRONATE SODIUM 70 MG PO TABS: 70.0000 mg | ORAL_TABLET | ORAL | 11 refills | Status: DC

## 2017-02-25 NOTE — Progress Notes (Addendum)
Subjective:  Patient ID: Katie [name], female    DOB: 1940-09-03  Age: 77 y.o. MRN: [id number]  CC: Follow-up   HPI [name] presents for blood pressure continues to remain in the normal range after discontinuation of spironolactone.  She is she is taking 2 Os-Cal a day.  She tells me that she was only given a months worth of the Fosamax.  She has done well since her diagnosis of endometrial cancer some years ago.  Her weight is remaining stable and she feels well.  Things are going well for her but her husband continues to annoy her.  Outpatient Medications Prior to Visit  Medication Sig Dispense Refill  . bimatoprost (LUMIGAN) 0.01 % SOLN 1 drop at bedtime.    Marland Kitchen BIOTIN PO Take 1 tablet by mouth daily.    . brimonidine (ALPHAGAN P) 0.1 % SOLN Use in the morning and evening.    Marland Kitchen esomeprazole (NEXIUM) 20 MG capsule Take 20 mg by mouth daily at 12 noon.    . pyridOXINE (VITAMIN B-6) 100 MG tablet Take 100 mg by mouth daily.    Marland Kitchen alendronate (FOSAMAX) 70 MG tablet Take 1 tablet (70 mg total) by mouth every 7 (seven) days. Take with a full glass of water on an empty stomach. 4 tablet 11  . levothyroxine (SYNTHROID, LEVOTHROID) 50 MCG tablet Take 50 mcg by mouth daily before breakfast.     No facility-administered medications prior to visit.     ROS Review of Systems  Constitutional: Negative.  Negative for unexpected weight change.  HENT: Negative.   Eyes: Negative.   Respiratory: Negative.   Cardiovascular: Negative.   Gastrointestinal: Negative.   Endocrine: Negative for polyphagia and polyuria.  Musculoskeletal: Negative for arthralgias and myalgias.  Skin: Negative.   Neurological: Negative for headaches.  Hematological: Negative.   Psychiatric/Behavioral: Negative.     Objective:  BP 120/62 (BP Location: Right Arm, Patient Position: Sitting, Cuff Size: Normal)   Pulse 67   Ht 5' (1.524 m)   Wt 147 lb 2 oz (66.7 kg)   SpO2 93%   BMI 28.73 kg/m   BP  Readings from Last 3 Encounters:  02/25/17 120/62  01/23/17 102/78    Wt Readings from Last 3 Encounters:  02/25/17 147 lb 2 oz (66.7 kg)  01/23/17 143 lb 6 oz (65 kg)    Physical Exam  Constitutional: She is oriented to person, place, and time. She appears well-developed and well-nourished. No distress.  HENT:  Head: Normocephalic and atraumatic.  Right Ear: External ear normal.  Left Ear: External ear normal.  Mouth/Throat: No oropharyngeal exudate.  Eyes: Conjunctivae are normal. Right eye exhibits no discharge. Left eye exhibits no discharge. No scleral icterus.  Neck: Neck supple. No JVD present. No tracheal deviation present.  Pulmonary/Chest: Effort normal. No stridor.  Neurological: She is alert and oriented to person, place, and time.  Skin: She is not diaphoretic.  Psychiatric: She has a normal mood and affect. Her behavior is normal.    Lab Results  Component Value Date   TSH 3.61 02/25/2017    Patient was never admitted.  Assessment & Plan:   [name] was seen today for follow-up.  Diagnoses and all orders for this visit:  Acquired hypothyroidism -     TSH -     Discontinue: levothyroxine (SYNTHROID, LEVOTHROID) 50 MCG tablet; Take 1 tablet (50 mcg total) by mouth daily before breakfast. -     levothyroxine (SYNTHROID, LEVOTHROID) 50 MCG tablet; Take  1 tablet (50 mcg total) by mouth daily before breakfast.  Age-related osteoporosis without current pathological fracture -     alendronate (FOSAMAX) 70 MG tablet; Take 1 tablet (70 mg total) by mouth every 7 (seven) days. Take with a full glass of water on an empty stomach.  History of endometrial cancer   I have discontinued [name]'s levothyroxine and levothyroxine. I am also having her start on levothyroxine. Additionally, I am having her maintain her brimonidine, bimatoprost, esomeprazole, pyridOXINE, BIOTIN PO, and alendronate.  Meds ordered this encounter  Medications  . alendronate  (FOSAMAX) 70 MG tablet    Sig: Take 1 tablet (70 mg total) by mouth every 7 (seven) days. Take with a full glass of water on an empty stomach.    Dispense:  4 tablet    Refill:  11  . DISCONTD: levothyroxine (SYNTHROID, LEVOTHROID) 50 MCG tablet    Sig: Take 1 tablet (50 mcg total) by mouth daily before breakfast.    Dispense:  100 tablet    Refill:  1  . levothyroxine (SYNTHROID, LEVOTHROID) 50 MCG tablet    Sig: Take 1 tablet (50 mcg total) by mouth daily before breakfast.    Dispense:  100 tablet    Refill:  0     Follow-up: Return in about 6 months (around 08/25/2017).  Libby Maw, MD

## 2017-02-26 MED ORDER — LEVOTHYROXINE SODIUM 50 MCG PO TABS: 50.0000 ug | ORAL_TABLET | Freq: Every day | ORAL | 1 refills | Status: DC

## 2017-02-26 NOTE — Addendum Note (Signed)
Addended by: Jon Billings on: 02/26/2017 11:53 AM   Modules accepted: Orders

## 2017-02-27 ENCOUNTER — Telehealth: Payer: Self-pay

## 2017-02-27 MED ORDER — LEVOTHYROXINE SODIUM 50 MCG PO TABS: 50.0000 ug | ORAL_TABLET | Freq: Every day | ORAL | 0 refills | Status: DC

## 2017-02-27 NOTE — Telephone Encounter (Signed)
Received a fax from [facility] stating that patient's insurance does not cover Levothyroxine any longer. They will cover: Levoxyl tab mcg, unithroid tab, or levot tab.

## 2017-02-27 NOTE — Telephone Encounter (Signed)
Patient is aware Rx was sent in - see lab note, follow up scheduled.

## 2017-02-27 NOTE — Telephone Encounter (Signed)
rx for generic was sent to pharmacy. Fu in 3 mos.

## 2017-02-27 NOTE — Addendum Note (Signed)
Addended by: Jon Billings on: 02/27/2017 12:52 PM   Modules accepted: Orders

## 2017-02-28 ENCOUNTER — Telehealth: Payer: Self-pay | Admitting: Family Medicine

## 2017-02-28 NOTE — Telephone Encounter (Signed)
Spoke with Katie [name] regarding AWV. She stated that she will make wellness appt after her visit with her PCP. SF

## 2017-03-05 ENCOUNTER — Telehealth: Payer: Self-pay

## 2017-03-05 DIAGNOSIS — Z419 Encounter for procedure for purposes other than remedying health state, unspecified: Secondary | ICD-10-CM

## 2017-03-05 NOTE — Telephone Encounter (Signed)
Copied from [place] [id number]. Topic: Referral - Request >> Mar 05, 2017 10:29 AM Valla Leaver wrote: Reason for CRM: Patient calling for referral to podiatry.  Okay to refer?

## 2017-03-06 NOTE — Telephone Encounter (Signed)
Referral entered  

## 2017-03-06 NOTE — Telephone Encounter (Signed)
Yes, okay to refer. Not sure why though.

## 2017-03-06 NOTE — Addendum Note (Signed)
Addended by: Kateri Mc E on: 03/06/2017 11:21 AM   Modules accepted: Orders

## 2017-03-29 DIAGNOSIS — H401133 Primary open-angle glaucoma, bilateral, severe stage: Secondary | ICD-10-CM | POA: Diagnosis not present

## 2017-04-09 ENCOUNTER — Ambulatory Visit (INDEPENDENT_AMBULATORY_CARE_PROVIDER_SITE_OTHER): Payer: Medicare Other | Admitting: Podiatry

## 2017-04-09 ENCOUNTER — Encounter: Payer: Self-pay | Admitting: Podiatry

## 2017-04-09 VITALS — BP 167/99 | HR 70

## 2017-04-09 DIAGNOSIS — B351 Tinea unguium: Secondary | ICD-10-CM | POA: Diagnosis not present

## 2017-04-09 DIAGNOSIS — M79675 Pain in left toe(s): Secondary | ICD-10-CM

## 2017-04-09 DIAGNOSIS — M79674 Pain in right toe(s): Secondary | ICD-10-CM | POA: Diagnosis not present

## 2017-04-09 NOTE — Progress Notes (Signed)
   Subjective:    Patient ID: Katie [name], female    DOB: 1940-03-12, 77 y.o.   MRN: [id number]  HPIthis patient presents to the office with chief complaint of long painful ingrowing toenails, both great toes.  She says her toes are painful walking and  wearing her shoes.  She says that she is unable to self treat.  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  She says she is also removed from [place] down to [name] and this is her initial office visit.   She presents the office for preventative foot care services    Review of Systems  All other systems reviewed and are negative.      Objective:   Physical Exam General Appearance  Alert, conversant and in no acute stress.  Vascular  Dorsalis pedis and posterior tibial  pulses are palpable  bilaterally.  Capillary return is within normal limits  bilaterally. Temperature is within normal limits  bilaterally.  Neurologic  Senn-Weinstein monofilament wire test within normal limits  bilaterally. Muscle power within normal limits bilaterally.  Nails Thick disfigured discolored nails with subungual debris  from hallux  bilaterally. No evidence of bacterial infection or drainage bilaterally.  Orthopedic  No limitations of motion of motion feet .  No crepitus or effusions noted.  HAV 1st MPJ left foot  Skin  normotropic skin with no porokeratosis noted bilaterally.  No signs of infections or ulcers noted.          Assessment & Plan:  Onychomycosis  Hallux  B/L  Debridement and grinding of long mycotic nails.  RTC 3 months   Gardiner Barefoot DPM

## 2017-04-17 DIAGNOSIS — R42 Dizziness and giddiness: Secondary | ICD-10-CM | POA: Diagnosis not present

## 2017-04-18 ENCOUNTER — Encounter: Payer: Self-pay | Admitting: Family Medicine

## 2017-04-18 ENCOUNTER — Ambulatory Visit (INDEPENDENT_AMBULATORY_CARE_PROVIDER_SITE_OTHER): Payer: Medicare Other | Admitting: Family Medicine

## 2017-04-18 VITALS — BP 138/72 | HR 62 | Wt 146.0 lb

## 2017-04-18 DIAGNOSIS — H8309 Labyrinthitis, unspecified ear: Secondary | ICD-10-CM

## 2017-04-18 DIAGNOSIS — R2689 Other abnormalities of gait and mobility: Secondary | ICD-10-CM

## 2017-04-18 NOTE — Progress Notes (Signed)
Subjective:  Patient ID: Katie [name], female    DOB: 06-10-1940  Age: 77 y.o. MRN: [id number]  CC: Dizziness   HPI [name] presents for follow-up of an acute episode of vertigo 4 days ago.  She felt a spinning sensation when she went to get up to go to bed.  EMS was notified and responded.  EK EKG taken in her home showed first-degree heart block but was otherwise normal.  She has had no further episodes of spinning sensations.  She has had this happen before.  She has had no recent URI symptoms.  She expresses ongoing frustration and caring for her husband.  She feels unsteady on her feet and uses her cane constantly.  She is rightfully concerned to think that her husband may not be able to help her if she were to fall and could not get back up on her own.  She requests physical therapy to improve her strength, conditioning and ability to ambulate.  Outpatient Medications Prior to Visit  Medication Sig Dispense Refill  . alendronate (FOSAMAX) 70 MG tablet Take 1 tablet (70 mg total) by mouth every 7 (seven) days. Take with a full glass of water on an empty stomach. 4 tablet 11  . bimatoprost (LUMIGAN) 0.01 % SOLN 1 drop at bedtime.    Marland Kitchen BIOTIN PO Take 1 tablet by mouth daily.    . brimonidine (ALPHAGAN P) 0.1 % SOLN Use in the morning and evening.    . Calcium Citrate-Vitamin D (CALCIUM CITRATE + D PO) Take by mouth 2 (two) times daily.    Marland Kitchen esomeprazole (NEXIUM) 20 MG capsule Take 20 mg by mouth daily at 12 noon.    Marland Kitchen levothyroxine (SYNTHROID, LEVOTHROID) 50 MCG tablet Take 1 tablet (50 mcg total) by mouth daily before breakfast. 100 tablet 0  . pyridOXINE (VITAMIN B-6) 100 MG tablet Take 100 mg by mouth daily.     No facility-administered medications prior to visit.     ROS Review of Systems  Constitutional: Negative for chills, fatigue, fever and unexpected weight change.  HENT: Positive for hearing loss. Negative for congestion, ear discharge, ear pain, postnasal  drip, rhinorrhea and sore throat.   Eyes: Negative for photophobia and visual disturbance.  Respiratory: Negative.   Cardiovascular: Negative.   Gastrointestinal: Negative.   Endocrine: Negative for cold intolerance and heat intolerance.  Musculoskeletal: Positive for arthralgias.  Neurological: Positive for dizziness and weakness. Negative for speech difficulty and headaches.  Hematological: Does not bruise/bleed easily.  Psychiatric/Behavioral: Negative.     Objective:  BP 138/72 (BP Location: Left Arm, Patient Position: Sitting, Cuff Size: Normal)   Pulse 62   Wt 146 lb (66.2 kg)   BMI 28.51 kg/m   BP Readings from Last 3 Encounters:  04/18/17 138/72  04/09/17 (!) 167/99  02/25/17 120/62    Wt Readings from Last 3 Encounters:  04/18/17 146 lb (66.2 kg)  02/25/17 147 lb 2 oz (66.7 kg)  01/23/17 143 lb 6 oz (65 kg)    Physical Exam  Constitutional: She is oriented to person, place, and time. She appears well-developed and well-nourished. No distress.  HENT:  Head: Normocephalic and atraumatic.  Right Ear: External ear normal.  Left Ear: External ear normal.  Mouth/Throat: Oropharynx is clear and moist. No oropharyngeal exudate.  Eyes: Conjunctivae are normal. Pupils are equal, round, and reactive to light. Right eye exhibits no discharge. Left eye exhibits no discharge. No scleral icterus.  Neck: Neck supple. No JVD present. No  tracheal deviation present. No thyromegaly present.  Cardiovascular: Normal rate, regular rhythm and normal heart sounds.  Pulmonary/Chest: Effort normal and breath sounds normal. No stridor.  Abdominal: Bowel sounds are normal.  Musculoskeletal:       Right shoulder: She exhibits decreased range of motion.       Left shoulder: She exhibits normal range of motion.  Lymphadenopathy:    She has no cervical adenopathy.  Neurological: She is alert and oriented to person, place, and time.  Skin: Skin is warm and dry. She is not diaphoretic.    Psychiatric: She has a normal mood and affect. Her behavior is normal.   General musculoskeletal exam shows limited range of motion to her right shoulder.  Range of motion of the left shoulder is intact.  I would grade her at 4 out of 5 strength in her upper extremities.  With regards to her lower extremities.  She is unable to walk on her heels.  She was able to stand up on her toes but could not walk on her toes.  She was unable to squat. Lab Results  Component Value Date   TSH 3.61 02/25/2017    Patient was never admitted.  Assessment & Plan:   [name] was seen today for dizziness.  Diagnoses and all orders for this visit:  Labyrinthitis, unspecified laterality  Need for assistance due to unsteady gait -     Ambulatory referral to Physical Therapy   I am having [name] maintain her brimonidine, bimatoprost, esomeprazole, pyridOXINE, BIOTIN PO, alendronate, levothyroxine, and Calcium Citrate-Vitamin D (CALCIUM CITRATE + D PO).  No orders of the defined types were placed in this encounter.    Follow-up: No Follow-up on file.  Libby Maw, MD

## 2017-04-18 NOTE — Patient Instructions (Addendum)
Dizziness Dizziness is a common problem. It is a feeling of unsteadiness or light-headedness. You may feel like you are about to faint. Dizziness can lead to injury if you stumble or fall. Anyone can become dizzy, but dizziness is more common in older adults. This condition can be caused by a number of things, including medicines, dehydration, or illness. Follow these instructions at home: Eating and drinking  Drink enough fluid to keep your urine clear or pale yellow. This helps to keep you from becoming dehydrated. Try to drink more clear fluids, such as water.  Do not drink alcohol.  Limit your caffeine intake if told to do so by your health care provider. Check ingredients and nutrition facts to see if a food or beverage contains caffeine.  Limit your salt (sodium) intake if told to do so by your health care provider. Check ingredients and nutrition facts to see if a food or beverage contains sodium. Activity  Avoid making quick movements. ? Rise slowly from chairs and steady yourself until you feel okay. ? In the morning, first sit up on the side of the bed. When you feel okay, stand slowly while you hold onto something until you know that your balance is fine.  If you need to stand in one place for a long time, move your legs often. Tighten and relax the muscles in your legs while you are standing.  Do not drive or use heavy machinery if you feel dizzy.  Avoid bending down if you feel dizzy. Place items in your home so that they are easy for you to reach without leaning over. Lifestyle  Do not use any products that contain nicotine or tobacco, such as cigarettes and e-cigarettes. If you need help quitting, ask your health care provider.  Try to reduce your stress level by using methods such as yoga or meditation. Talk with your health care provider if you need help to manage your stress. General instructions  Watch your dizziness for any changes.  Take over-the-counter and  prescription medicines only as told by your health care provider. Talk with your health care provider if you think that your dizziness is caused by a medicine that you are taking.  Tell a friend or a family member that you are feeling dizzy. If he or she notices any changes in your behavior, have this person call your health care provider.  Keep all follow-up visits as told by your health care provider. This is important. Contact a health care provider if:  Your dizziness does not go away.  Your dizziness or light-headedness gets worse.  You feel nauseous.  You have reduced hearing.  You have new symptoms.  You are unsteady on your feet or you feel like the room is spinning. Get help right away if:  You vomit or have diarrhea and are unable to eat or drink anything.  You have problems talking, walking, swallowing, or using your arms, hands, or legs.  You feel generally weak.  You are not thinking clearly or you have trouble forming sentences. It may take a friend or family member to notice this.  You have chest pain, abdominal pain, shortness of breath, or sweating.  Your vision changes.  You have any bleeding.  You have a severe headache.  You have neck pain or a stiff neck.  You have a fever. These symptoms may represent a serious problem that is an emergency. Do not wait to see if the symptoms will go away. Get medical help   right away. Call your local emergency services (911 in the [place] Do not drive yourself to the hospital. Summary  Dizziness is a feeling of unsteadiness or light-headedness. This condition can be caused by a number of things, including medicines, dehydration, or illness.  Anyone can become dizzy, but dizziness is more common in older adults.  Drink enough fluid to keep your urine clear or pale yellow. Do not drink alcohol.  Avoid making quick movements if you feel dizzy. Monitor your dizziness for any changes. This information is not intended to  replace advice given to you by your health care provider. Make sure you discuss any questions you have with your health care provider. Document Released: 07/18/2000 Document Revised: 02/25/2016 Document Reviewed: 02/25/2016 Elsevier Interactive Patient Education  2018 [place] Prevention in the Home Falls can cause injuries and can affect people from all age groups. There are many simple things that you can do to make your home safe and to help prevent falls. What can I do on the outside of my home?  Regularly repair the edges of walkways and driveways and fix any cracks.  Remove high doorway thresholds.  Trim any shrubbery on the main path into your home.  Use bright outdoor lighting.  Clear walkways of debris and clutter, including tools and rocks.  Regularly check that handrails are securely fastened and in good repair. Both sides of any steps should have handrails.  Install guardrails along the edges of any raised decks or porches.  Have leaves, snow, and ice cleared regularly.  Use sand or salt on walkways during winter months.  In the garage, clean up any spills right away, including grease or oil spills. What can I do in the bathroom?  Use night lights.  Install grab bars by the toilet and in the tub and shower. Do not use towel bars as grab bars.  Use non-skid mats or decals on the floor of the tub or shower.  If you need to sit down while you are in the shower, use a plastic, non-slip stool.  Keep the floor dry. Immediately clean up any water that spills on the floor.  Remove soap buildup in the tub or shower on a regular basis.  Attach bath mats securely with double-sided non-slip rug tape.  Remove throw rugs and other tripping hazards from the floor. What can I do in the bedroom?  Use night lights.  Make sure that a bedside light is easy to reach.  Do not use oversized bedding that drapes onto the floor.  Have a firm chair that has side arms to  use for getting dressed.  Remove throw rugs and other tripping hazards from the floor. What can I do in the kitchen?  Clean up any spills right away.  Avoid walking on wet floors.  Place frequently used items in easy-to-reach places.  If you need to reach for something above you, use a sturdy step stool that has a grab bar.  Keep electrical cables out of the way.  Do not use floor polish or wax that makes floors slippery. If you have to use wax, make sure that it is non-skid floor wax.  Remove throw rugs and other tripping hazards from the floor. What can I do in the stairways?  Do not leave any items on the stairs.  Make sure that there are handrails on both sides of the stairs. Fix handrails that are broken or loose. Make sure that handrails are as long as the  stairways.  Check any carpeting to make sure that it is firmly attached to the stairs. Fix any carpet that is loose or worn.  Avoid having throw rugs at the top or bottom of stairways, or secure the rugs with carpet tape to prevent them from moving.  Make sure that you have a light switch at the top of the stairs and the bottom of the stairs. If you do not have them, have them installed. What are some other fall prevention tips?  Wear closed-toe shoes that fit well and support your feet. Wear shoes that have rubber soles or low heels.  When you use a stepladder, make sure that it is completely opened and that the sides are firmly locked. Have someone hold the ladder while you are using it. Do not climb a closed stepladder.  Add color or contrast paint or tape to grab bars and handrails in your home. Place contrasting color strips on the first and last steps.  Use mobility aids as needed, such as canes, walkers, scooters, and crutches.  Turn on lights if it is dark. Replace any light bulbs that burn out.  Set up furniture so that there are clear paths. Keep the furniture in the same spot.  Fix any uneven floor  surfaces.  Choose a carpet design that does not hide the edge of steps of a stairway.  Be aware of any and all pets.  Review your medicines with your healthcare provider. Some medicines can cause dizziness or changes in blood pressure, which increase your risk of falling. Talk with your health care provider about other ways that you can decrease your risk of falls. This may include working with a physical therapist or trainer to improve your strength, balance, and endurance. This information is not intended to replace advice given to you by your health care provider. Make sure you discuss any questions you have with your health care provider. Document Released: 01/12/2002 Document Revised: 06/21/2015 Document Reviewed: 02/26/2014 Elsevier Interactive Patient Education  2018 [employer].  [employer] are used to help with walking. Using a cane makes you more stable, reduces pain, and eases strain on certain muscle groups. It is important to use a cane that fits properly. A cane fits properly if the top of the cane comes to your wrist joint when you are standing upright with your arm relaxed at your side. How to use your cane Hold your cane in the hand opposite the injured or weaker side. Move the cane with the weaker foot at all times. Walking  Put as much weight on the cane as necessary to make walking comfortable, stable, and smooth.  Stand tall with good posture and look ahead, not down at your feet. Going Up Steps  Step first with your stronger foot.  Move the cane and the weaker foot up the step at the same time. Going Down Steps  Step first with the cane and your weaker foot.  Always use the railing with your free hand. Contact a health care provider if:  You still feel unsteady on your feet.  You develop new pain, for example in your back, shoulder, wrist, or hip.  You develop any numbness or tingling. Get help right away if: You fall. This information is not  intended to replace advice given to you by your health care provider. Make sure you discuss any questions you have with your health care provider. Document Released: 01/22/2005 Document Revised: 07/06/2015 Document Reviewed: 09/25/2012 Elsevier Interactive Patient  Education  2017 Else[employer] Inc[employer]  Labyrinthitis Labyrinthitis is an infection of the inner ear. Your inner ear is a system of tubes and canals (labyrinth). These are filled with fluid. Nerve cells in your inner ear send signals for hearing and balance to your brain. When tiny germs get inside the tubes and canals, they harm the cells that send messages to the brain. This can cause changes in hearing and balance. Follow these instructions at home:  Take medicines only as told by your doctor.  If you were prescribed an antibiotic medicine, finish all of it even if you start to feel better.  Rest as much as possible.  Avoid loud noises and bright lights.  Do not make sudden movements until any dizziness goes away.  Do not drive until your doctor says that you can.  Drink enough fluid to keep your pee (urine) clear or pale yellow.  Work with a physical therapist if you still feel dizzy after several weeks. A therapist can teach you exercises to help you deal with your dizziness.  Keep all follow-up visits as told by your doctor. This is important. Contact a doctor if:  Your symptoms do not get better with medicines.  You do not get better after two weeks.  You have a fever. Get help right away if:  You are very dizzy.  You keep throwing up (vomiting) or keep feeling sick to your stomach (nauseous).  Your hearing gets a lot worse very quickly. This information is not intended to replace advice given to you by your health care provider. Make sure you discuss any questions you have with your health care provider. Document Released: 01/22/2005 Document Revised: 06/30/2015 Document Reviewed: 11/03/2013 Elsevier Interactive  Patient Education  [employer].

## 2017-05-07 ENCOUNTER — Ambulatory Visit: Payer: Medicare Other | Admitting: Physical Therapy

## 2017-05-09 ENCOUNTER — Encounter: Payer: Self-pay | Admitting: Physical Therapy

## 2017-05-09 ENCOUNTER — Ambulatory Visit: Payer: Medicare Other | Attending: Family Medicine | Admitting: Physical Therapy

## 2017-05-09 ENCOUNTER — Other Ambulatory Visit: Payer: Self-pay

## 2017-05-09 DIAGNOSIS — R2689 Other abnormalities of gait and mobility: Secondary | ICD-10-CM | POA: Diagnosis not present

## 2017-05-09 DIAGNOSIS — R296 Repeated falls: Secondary | ICD-10-CM | POA: Diagnosis not present

## 2017-05-09 DIAGNOSIS — R2681 Unsteadiness on feet: Secondary | ICD-10-CM | POA: Insufficient documentation

## 2017-05-09 DIAGNOSIS — R208 Other disturbances of skin sensation: Secondary | ICD-10-CM | POA: Insufficient documentation

## 2017-05-09 DIAGNOSIS — M6281 Muscle weakness (generalized): Secondary | ICD-10-CM | POA: Insufficient documentation

## 2017-05-09 DIAGNOSIS — R42 Dizziness and giddiness: Secondary | ICD-10-CM | POA: Insufficient documentation

## 2017-05-09 NOTE — Therapy (Signed)
[facility] [address] [place], [place], [address] Phone: [phone]   Fax:  [phone]  Physical Therapy Evaluation  Patient Details  Name: Katie [name] MRN: [id number] Date of Birth: [birth year] Referring Provider: Libby Maw, MD   Encounter Date: 05/09/2017  PT End of Session - 05/09/17 1652    Visit Number  1    Number of Visits  17    Date for PT Re-Evaluation  08/07/17    Authorization Type  Medicare and [place]    PT Start Time  6967    PT Stop Time  1620    PT Time Calculation (min)  45 min    Activity Tolerance  Patient tolerated treatment well    Behavior During Therapy  [facility] for tasks assessed/performed       Past Medical History:  Diagnosis Date  . Arthritis   . Cancer ([place])   . Glaucoma   . Thyroid disease     Past Surgical History:  Procedure Laterality Date  . ABDOMINAL HYSTERECTOMY    . APPENDECTOMY    . BREAST SURGERY    . CHOLECYSTECTOMY      There were no vitals filed for this visit.   Subjective Assessment - 05/09/17 1545    Subjective  Pt moved to [place] from [place] in November to be closer to her daughter after her husband had a CVA.  Primary caregiver for husband.  Pt's daughter requested for pt to have PT because she is "wobbly and weak".  Has a cane but does not use it in the house.  One major fall with L hip fracture; one minor fall when pulling refrigerator door open too fast and fell backwards onto buttocks.        Pertinent History  hypothyroidism, vertigo with questionable labyrinthitis, osteoporosis, endometrial cancer with chemo and radiation, peripheral neuropathy, OA, chronic back pain and fall with L hip fracture requiring fixation    Limitations  Standing;Walking    How long can you stand comfortably?  has to sit on stool in the kitchen    Patient Stated Goals  get a little stronger and decrease back pain    Currently in Pain?  Yes also has neuropathic pain  in bilat LE    Pain Location  Back    Pain Orientation  Lower    Pain Descriptors / Indicators  Sharp;Discomfort    Pain Type  Chronic pain         [facility] PT Assessment - 05/09/17 1553      Assessment   Medical Diagnosis  balance and gait abnormality    Referring Provider  Libby Maw, MD    Onset Date/Surgical Date  04/18/17    Prior Therapy  yes      Precautions   Precautions  Fall;Other (comment)    Precaution Comments  hypothyroidism, vertigo with questionable labyrinthitis, osteoporosis, endometrial cancer with chemo and radiation, peripheral neuropathy, OA, chronic back pain and fall with L hip fracture requiring fixation      Balance Screen   Has the patient fallen in the past 6 months  Yes    How many times?  2    Has the patient had a decrease in activity level because of a fear of falling?   No    Is the patient reluctant to leave their home because of a fear of falling?   No      Home Film/video editor  residence    Living Arrangements  Spouse/significant other    Type of [place] to enter    Entrance Stairs-Number of Steps  1    [place]  One level    [place] - single point;Tub bench    Additional Comments  one step/curb      Prior Function   Level of Independence  Requires assistive device for independence;Independent with transfers;Independent with gait;Independent with household mobility with device;Independent with community mobility with device      Observation/Other Assessments   Focus on Therapeutic Outcomes (FOTO)   Not indicated      Observation/Other Assessments-Edema    Edema  -- bilat ankle dependent edema      Sensation   Light Touch  Impaired Detail    Light Touch Impaired Details  Impaired RLE;Impaired LLE      ROM / Strength   AROM / PROM / Strength  Strength      Strength   Overall Strength  Deficits    Overall Strength Comments  R ankle instability due to  severe sprain when younger; severe ligament damage.  LLE: 3/5 hip flexion, 4-/5 knee flexion, extension and ankle DF.  Wears knee brace on R knee due to OA.  3/5 hip flexion, knee extension.  4-/5 knee flexion and ankle DF      Ambulation/Gait   Ambulation/Gait  Yes    Ambulation/Gait Assistance  6: Modified independent (Device/Increase time)    Ambulation Distance (Feet)  100 Feet    Assistive device  Straight cane    Gait Pattern  Step-through pattern;Lateral hip instability;Antalgic    Ambulation Surface  Level;Indoor      Standardized Balance Assessment   Standardized Balance Assessment  Five Times Sit to Stand;Timed Up and Go Test;Berg Balance Test    Five times sit to stand comments   21.25 seconds with use of UE      Berg Balance Test   Sit to Stand  Able to stand  independently using hands    Standing Unsupported  Able to stand 2 minutes with supervision    Sitting with Back Unsupported but Feet Supported on Floor or Stool  Able to sit safely and securely 2 minutes    Stand to Sit  Controls descent by using hands    Transfers  Able to transfer safely, minor use of hands    Standing Unsupported with Eyes Closed  Able to stand 10 seconds with supervision    Standing Ubsupported with Feet Together  Able to place feet together independently and stand for 1 minute with supervision    From Standing, Reach Forward with Outstretched Arm  Can reach confidently >25 cm (10")    From Standing Position, Pick up Object from [place] to pick up shoe safely and easily    From Standing Position, Turn to Look Behind Over each Shoulder  Looks behind one side only/other side shows less weight shift    Turn 360 Degrees  Able to turn 360 degrees safely but slowly    Standing Unsupported, Alternately Place Feet on Step/Stool  Able to complete >2 steps/needs minimal assist    Standing Unsupported, One Foot in Front  Able to plae foot ahead of the other independently and hold 30 seconds    Standing on  One Leg  Tries to lift leg/unable to hold 3 seconds but remains standing independently    Total Score  41  Berg comment:  41/56      Timed Up and Go Test   TUG  Normal TUG    Normal TUG (seconds)  14.1    TUG Comments  without cane and knee brace         Objective measurements completed on examination: See above findings.        PT Education - 05/09/17 1652    Education provided  Yes    Education Details  clinical findings, PT POC and goals    Person(s) Educated  Patient    Methods  Explanation    Comprehension  Verbalized understanding       PT Short Term Goals - 05/09/17 1659      PT SHORT TERM GOAL #1   Title  Pt will demonstrate independence with initial HEP    Time  4    Period  Weeks    Status  New    Target Date  06/08/17      PT SHORT TERM GOAL #2   Title  Pt will improve five time sit to stand to </= 17 seconds with use of UE    Baseline  21.25    Time  4    Period  Weeks    Status  New    Target Date  06/08/17      PT SHORT TERM GOAL #3   Title  Pt will improve BERG score to >/= 46/56    Baseline  41/56 significant falls risk    Time  4    Period  Weeks    Status  New    Target Date  06/08/17      PT SHORT TERM GOAL #4   Title  Pt will improve TUG, without use of cane) to </= 12 seconds    Baseline  14.1 seconds without cane    Time  4    Period  Weeks    Status  New    Target Date  06/08/17      PT SHORT TERM GOAL #5   Title  Pt will negotiate curb with cane and supervision    Time  4    Period  Weeks    Status  New    Target Date  06/08/17        PT Long Term Goals - 05/09/17 1705      PT LONG TERM GOAL #1   Title  Pt will demonstrate independence with balance, strengthening HEP    Time  12    Period  Weeks    Status  New    Target Date  08/07/17      PT LONG TERM GOAL #2   Title  Pt will improve BERG to >/= 50/56    Time  12    Period  Weeks    Status  New    Target Date  08/07/17      PT LONG TERM GOAL #3   Title   Pt will improve five time sit to stand </= 13 seconds with UE support    Time  12    Period  Weeks    Status  New    Target Date  08/07/17      PT LONG TERM GOAL #4   Title  Pt will ambulate x 200' over paved surfaces and up/down community curb with cane MOD I    Time  12    Period  Weeks    Status  New  Target Date  08/07/17      PT LONG TERM GOAL #5   Title  Pt will report being able to stand in kitchen to fix a meal x 10-15 minutes without using stool    Time  12    Period  Weeks    Status  New    Target Date  08/07/17             Plan - 05/09/17 1653    Clinical Impression Statement  Pt is a 77 year old female referred to Neuro OPPT for evaluation of LE weakness and gait impairments.  Pt's PMH is significant for the following: hypothyroidism, vertigo with questionable labyrinthitis, osteoporosis, endometrial cancer with chemo and radiation, peripheral neuropathy, OA, chronic back pain and fall with L hip fracture requiring fixation. The following deficits were noted during pt's exam: impaired sensation, impaired LE strength, low back pain, impaired balance and gait.   Pt's five time sit to stand, BERG and TUG scores indicate pt is at risk for falls. Pt reports recent episode of acute vertigo which resolved spontaneously but due to increased falls risk with vertigo, will incorporate into treatment plan in case of reoccurence of vertigo symptoms.  Pt would benefit from skilled PT to address these impairments and functional limitations to maximize functional mobility independence and reduce falls risk.    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, hypothyroidism, vertigo with questionable labyrinthitis, osteoporosis, endometrial cancer with chemo and radiation, peripheral neuropathy, OA, chronic back pain and fall with L hip fracture requiring fixation    Clinical Decision Making  Moderate    Rehab Potential  Good    PT Frequency  2x / week    PT Duration  12 weeks will be on hold for a few weeks, return to [place]    PT Treatment/Interventions  ADLs/Self Care Home Management;Canalith Repostioning;Moist Heat;Cryotherapy;Electrical Stimulation;DME Instruction;Gait training;Stair training;Functional mobility training;Therapeutic activities;Therapeutic exercise;Balance training;Neuromuscular re-education;Patient/family education;Manual techniques;Passive range of motion;Taping;Vestibular    PT Next Visit Plan  initiate strengthening HEP: L proximal hip strengthening (ABD/Glute), standing OTAGO with UE support, balance focusing on compensation due to peripheral neuropathy    PT Home Exercise Plan  also needs core/back stabilization exercises, decompression exercises    Consulted and Agree with Plan of Care  Patient       Patient will benefit from skilled therapeutic intervention in order to improve the following deficits and impairments:  Abnormal gait, Decreased balance, Decreased strength, Difficulty walking, Dizziness, Increased edema, Impaired sensation, Pain  Visit Diagnosis: Muscle weakness (generalized)  Other abnormalities of gait and mobility  Unsteadiness on feet  Other disturbances of skin sensation  Repeated falls  Dizziness and giddiness     Problem List Patient Active Problem List   Diagnosis Date Noted  . Labyrinthitis 04/18/2017  . Need for assistance due to unsteady gait 04/18/2017  . Age-related osteoporosis without current pathological fracture 01/23/2017  . Acquired hypothyroidism 01/23/2017  . History of endometrial cancer  01/23/2017   Rico Junker, PT, DPT 05/09/17    5:11 PM    [facility] [phone]  [place], [place], [address] Phone: [phone]   Fax:  [phone]  Name: Katie [name] MRN: [id number] Date of Birth: [birth year]

## 2017-05-10 ENCOUNTER — Ambulatory Visit: Payer: Medicare Other | Admitting: Physical Therapy

## 2017-05-10 ENCOUNTER — Telehealth: Payer: Self-pay | Admitting: Family Medicine

## 2017-05-10 ENCOUNTER — Encounter: Payer: Self-pay | Admitting: Physical Therapy

## 2017-05-10 DIAGNOSIS — R296 Repeated falls: Secondary | ICD-10-CM | POA: Diagnosis not present

## 2017-05-10 DIAGNOSIS — R2689 Other abnormalities of gait and mobility: Secondary | ICD-10-CM | POA: Diagnosis not present

## 2017-05-10 DIAGNOSIS — R42 Dizziness and giddiness: Secondary | ICD-10-CM | POA: Diagnosis not present

## 2017-05-10 DIAGNOSIS — R2681 Unsteadiness on feet: Secondary | ICD-10-CM | POA: Diagnosis not present

## 2017-05-10 DIAGNOSIS — M6281 Muscle weakness (generalized): Secondary | ICD-10-CM | POA: Diagnosis not present

## 2017-05-10 DIAGNOSIS — R208 Other disturbances of skin sensation: Secondary | ICD-10-CM | POA: Diagnosis not present

## 2017-05-10 NOTE — Telephone Encounter (Signed)
Pt came in on 4/3 concerning the bill she received. I called Dawn with [place] coding to look into patient account. Dawn discovered that our office/ billing department do have all 3 insurances for her. Dawn also stated [place] ins is waiting on the patient for more info before they pay the claim. I spoke with patient to make her aware to call [employer] to ask what information do they need to get the claim process. Pt will call.

## 2017-05-10 NOTE — Patient Instructions (Addendum)
Pelvic Tilt: Posterior - Legs Bent (Supine)    Tighten stomach and flatten back by rolling pelvis down. Hold __5__ seconds. Relax. Repeat _10_ times per set. Do _1_ sets per session. Do _1-2_ sessions per day.  http://orth.exer.us/203   Copyright  VHI. All rights reserved.     Hip fallout  With knees both knees bent and holding the one knee in position, rotate one leg out to an angle and come back to the neutral position. Perform 10 reps with each leg. 1-2 times a day.  Bent Leg Lift (Hook-Lying)    With red band around legs above knees: tighten stomach and slowly raise one leg 3-4 inches from floor then slowly lower back down, repeat with other leg. Keep trunk rigid.  Repeat _10_ times per set. Do _1_ sets per session. Do _1-2_ sessions per day.  http://orth.exer.us/1090   Copyright  VHI. All rights reserved.   Perform these at the counter for balance assistance as needed:  Side-Stepping    Walk to left side with eyes open. Take even steps, leading with same foot. Make sure each foot lifts off the floor. Repeat in opposite direction. Repeat for 3 laps each way per session. Do _1-2_ sessions per day. Copyright  VHI. All rights reserved.   Feet Heel-Toe "Tandem"    Holding onto the counter, walk a straight line bringing one foot directly in front of the other along the length of the counter top. Turn around and walk a straight line back to starting position. Repeat for another lap down and back walking the straight line.   Do _1-2_ sessions per day.  Copyright  VHI. All rights reserved.

## 2017-05-11 NOTE — Therapy (Signed)
[facility] [address] [place], [place], [address] Phone: [phone]   Fax:  [phone]  Physical Therapy Treatment  Patient Details  Name: Katie [name] MRN: [id number] Date of Birth: [birth year] Referring Provider: Libby Maw, MD   Encounter Date: 05/10/2017  PT End of Session - 05/10/17 1627    Visit Number  2    Number of Visits  17    Date for PT Re-Evaluation  08/07/17    Authorization Type  Medicare and [employer]    PT Start Time  1620    PT Stop Time  1702    PT Time Calculation (min)  42 min    Activity Tolerance  Patient tolerated treatment well    Behavior During Therapy  [facility] for tasks assessed/performed       Past Medical History:  Diagnosis Date  . Arthritis   . Cancer ([place])   . Glaucoma   . Thyroid disease     Past Surgical History:  Procedure Laterality Date  . ABDOMINAL HYSTERECTOMY    . APPENDECTOMY    . BREAST SURGERY    . CHOLECYSTECTOMY      There were no vitals filed for this visit.  Subjective Assessment - 05/10/17 1625    Subjective  No new complaints. No falls. "Always" with pain in back.     Pertinent History  hypothyroidism, vertigo with questionable labyrinthitis, osteoporosis, endometrial cancer with chemo and radiation, peripheral neuropathy, OA, chronic back pain and fall with L hip fracture requiring fixation    Limitations  Standing;Walking    How long can you stand comfortably?  has to sit on stool in the kitchen    Patient Stated Goals  get a little stronger and decrease back pain    Currently in Pain?  Yes    Pain Score  6     Pain Location  Back    Pain Orientation  Lower    Pain Descriptors / Indicators  Aching    Pain Type  Chronic pain    Pain Onset  More than a month ago    Pain Frequency  Constant    Aggravating Factors   standing, doing things    Pain Relieving Factors  nothing      treatment: issued the following to HEP today with  min guard assist for balance. Cues needed for ex form and technique.  Pelvic Tilt: Posterior - Legs Bent (Supine)    Tighten stomach and flatten back by rolling pelvis down. Hold __5__ seconds. Relax. Repeat _10_ times per set. Do _1_ sets per session. Do _1-2_ sessions per day.  http://orth.exer.us/203   Copyright  VHI. All rights reserved.     Hip fallout  With knees both knees bent and holding the one knee in position, rotate one leg out to an angle and come back to the neutral position. Perform 10 reps with each leg. 1-2 times a day.  Bent Leg Lift (Hook-Lying)    With red band around legs above knees: tighten stomach and slowly raise one leg 3-4 inches from floor then slowly lower back down, repeat with other leg. Keep trunk rigid.  Repeat _10_ times per set. Do _1_ sets per session. Do _1-2_ sessions per day.  http://orth.exer.us/1090   Copyright  VHI. All rights reserved.   Perform these at the counter for balance assistance as needed:  Side-Stepping    Walk to left side with eyes open. Take even steps, leading with same foot.  Make sure each foot lifts off the floor. Repeat in opposite direction. Repeat for 3 laps each way per session. Do _1-2_ sessions per day. Copyright  VHI. All rights reserved.   Feet Heel-Toe "Tandem"    Holding onto the counter, walk a straight line bringing one foot directly in front of the other along the length of the counter top. Turn around and walk a straight line back to starting position. Repeat for another lap down and back walking the straight line.   Do _1-2_ sessions per day.  Copyright  VHI. All rights reserved.       PT Education - 05/10/17 1655    Education provided  Yes    Education Details  HEP for strenthening and balance    Person(s) Educated  Patient    Methods  Explanation;Demonstration;Verbal cues;Handout    Comprehension  Verbalized understanding;Returned demonstration;Verbal cues required;Need further  instruction       PT Short Term Goals - 05/09/17 1659      PT SHORT TERM GOAL #1   Title  Pt will demonstrate independence with initial HEP    Time  4    Period  Weeks    Status  New    Target Date  06/08/17      PT SHORT TERM GOAL #2   Title  Pt will improve five time sit to stand to </= 17 seconds with use of UE    Baseline  21.25    Time  4    Period  Weeks    Status  New    Target Date  06/08/17      PT SHORT TERM GOAL #3   Title  Pt will improve BERG score to >/= 46/56    Baseline  41/56 significant falls risk    Time  4    Period  Weeks    Status  New    Target Date  06/08/17      PT SHORT TERM GOAL #4   Title  Pt will improve TUG, without use of cane) to </= 12 seconds    Baseline  14.1 seconds without cane    Time  4    Period  Weeks    Status  New    Target Date  06/08/17      PT SHORT TERM GOAL #5   Title  Pt will negotiate curb with cane and supervision    Time  4    Period  Weeks    Status  New    Target Date  06/08/17        PT Long Term Goals - 05/09/17 1705      PT LONG TERM GOAL #1   Title  Pt will demonstrate independence with balance, strengthening HEP    Time  12    Period  Weeks    Status  New    Target Date  08/07/17      PT LONG TERM GOAL #2   Title  Pt will improve BERG to >/= 50/56    Time  12    Period  Weeks    Status  New    Target Date  08/07/17      PT LONG TERM GOAL #3   Title  Pt will improve five time sit to stand </= 13 seconds with UE support    Time  12    Period  Weeks    Status  New    Target Date  08/07/17  PT LONG TERM GOAL #4   Title  Pt will ambulate x 200' over paved surfaces and up/down community curb with cane MOD I    Time  12    Period  Weeks    Status  New    Target Date  08/07/17      PT LONG TERM GOAL #5   Title  Pt will report being able to stand in kitchen to fix a meal x 10-15 minutes without using stool    Time  12    Period  Weeks    Status  New    Target Date  08/07/17          Plan - 05/10/17 1627    Clinical Impression Statement  Today's skilled session focused on establishment of an HEP for strengthening and balance. No issues reported with performance in session except with bridging which increased back pain, therefore did not send home and stopped after 3 reps. Pt is progressing toward goals and should benefit from continued PT to progress toward unmet goals.     Rehab Potential  Good    PT Frequency  2x / week    PT Duration  12 weeks will be on hold for a few weeks, return to [place]    PT Treatment/Interventions  ADLs/Self Care Home Management;Canalith Repostioning;Moist Heat;Cryotherapy;Electrical Stimulation;DME Instruction;Gait training;Stair training;Functional mobility training;Therapeutic activities;Therapeutic exercise;Balance training;Neuromuscular re-education;Patient/family education;Manual techniques;Passive range of motion;Taping;Vestibular    PT Next Visit Plan   balance focusing on compensation due to peripheral neuropathy, strengthening of core/LE's (avoid extension due to stenosis/disc bulging)    PT Home Exercise Plan  also needs core/back stabilization exercises, decompression exercises    Consulted and Agree with Plan of Care  Patient       Patient will benefit from skilled therapeutic intervention in order to improve the following deficits and impairments:  Abnormal gait, Decreased balance, Decreased strength, Difficulty walking, Dizziness, Increased edema, Impaired sensation, Pain  Visit Diagnosis: Muscle weakness (generalized)  Other abnormalities of gait and mobility  Unsteadiness on feet     Problem List Patient Active Problem List   Diagnosis Date Noted  . Labyrinthitis 04/18/2017  . Need for assistance due to unsteady gait 04/18/2017  . Age-related osteoporosis without current pathological fracture 01/23/2017  . Acquired hypothyroidism 01/23/2017  . History of endometrial cancer 01/23/2017   Willow Ora, PTA,  [facility] [address], [place], [place] [address] [phone] 05/11/17, 4:21 PM   Name: Katie [name] MRN: [id number] Date of Birth: 19-Apr-1940

## 2017-05-14 ENCOUNTER — Ambulatory Visit: Payer: Medicare Other

## 2017-05-14 DIAGNOSIS — R2689 Other abnormalities of gait and mobility: Secondary | ICD-10-CM | POA: Diagnosis not present

## 2017-05-14 DIAGNOSIS — R296 Repeated falls: Secondary | ICD-10-CM | POA: Diagnosis not present

## 2017-05-14 DIAGNOSIS — R42 Dizziness and giddiness: Secondary | ICD-10-CM | POA: Diagnosis not present

## 2017-05-14 DIAGNOSIS — R2681 Unsteadiness on feet: Secondary | ICD-10-CM

## 2017-05-14 DIAGNOSIS — M6281 Muscle weakness (generalized): Secondary | ICD-10-CM | POA: Diagnosis not present

## 2017-05-14 DIAGNOSIS — R208 Other disturbances of skin sensation: Secondary | ICD-10-CM

## 2017-05-14 NOTE — Therapy (Signed)
[facility] [address] [place], [place], [address] Phone: [phone]   Fax:  [phone]  Physical Therapy Treatment  Patient Details  Name: Katie [name] MRN: [id number] Date of Birth: September 27, 1940 Referring Provider: Libby Maw, MD   Encounter Date: 05/14/2017  PT End of Session - 05/14/17 1553    Visit Number  3    Number of Visits  17    Date for PT Re-Evaluation  08/07/17    Authorization Type  Medicare and [employer]    PT Start Time  1448    PT Stop Time  1530    PT Time Calculation (min)  42 min    Equipment Utilized During Treatment  -- min guard to S prn    Activity Tolerance  Patient tolerated treatment well    Behavior During Therapy  [facility] for tasks assessed/performed       Past Medical History:  Diagnosis Date  . Arthritis   . Cancer ([place])   . Glaucoma   . Thyroid disease     Past Surgical History:  Procedure Laterality Date  . ABDOMINAL HYSTERECTOMY    . APPENDECTOMY    . BREAST SURGERY    . CHOLECYSTECTOMY      There were no vitals filed for this visit.  Subjective Assessment - 05/14/17 1453    Subjective  Pt denied falls or changes since last visit. pt brought her tablet to show [place] her "hardware" in her L hip. Pt reports she's performing HEP at home. Pt thinks she's allergic to the pollen.     Pertinent History  hypothyroidism, vertigo with questionable labyrinthitis, osteoporosis, endometrial cancer with chemo and radiation, peripheral neuropathy, OA, chronic back pain and fall with L hip fracture requiring fixation    Patient Stated Goals  get a little stronger and decrease back pain    Currently in Pain?  Yes    Pain Score  4     Pain Location  Back    Pain Orientation  Lower    Pain Descriptors / Indicators  Aching;Dull    Pain Type  Chronic pain    Pain Onset  More than a month ago    Pain Frequency  Constant    Aggravating Factors   standing for prolonged periods  of time    Pain Relieving Factors  sitting                        [facility] Adult PT Treatment/Exercise - 05/14/17 1543      High Level Balance   High Level Balance Activities  Side stepping;Other (comment) tandem gait    High Level Balance Comments  Performed in // bars with S and intermittent UE support for safety. 4x10'/activity. Cues for improve weight shifting. Reviewed as pt wanted to review standing balance HEP.           Balance Exercises - 05/14/17 1541      Balance Exercises: Standing   Standing Eyes Opened  Head turns;Wide (BOA);Narrow base of support (BOS);Foam/compliant surface;Other reps (comment);3 reps;10 secs;30 secs 10 reps    Standing Eyes Closed  Narrow base of support (BOS);Wide (BOA);Foam/compliant surface;3 reps;10 secs    Tandem Stance  Eyes open;Intermittent upper extremity support;Foam/compliant surface;2 reps;30 secs    SLS  Eyes open;Solid surface;Intermittent upper extremity support;4 reps;10 secs    Other Standing Exercises  Pt performed balance activities in // bars with min guard to S to ensure safety.  Cues and demo for technique. Please see pt instructions for HEP details.         PT Education - 05/14/17 1540    Education provided  Yes    Education Details  PT reviewed balance HEP and added static standing balance activities to improve visual and vestibular input. PT discussed purchase of a body pillow, as pt sleeps on her side and stated a throw pillow was too thick.     Person(s) Educated  Patient    Methods  Explanation;Demonstration;Verbal cues;Handout    Comprehension  Returned demonstration;Verbalized understanding       PT Short Term Goals - 05/09/17 1659      PT SHORT TERM GOAL #1   Title  Pt will demonstrate independence with initial HEP    Time  4    Period  Weeks    Status  New    Target Date  06/08/17      PT SHORT TERM GOAL #2   Title  Pt will improve five time sit to stand to </= 17 seconds with use of UE     Baseline  21.25    Time  4    Period  Weeks    Status  New    Target Date  06/08/17      PT SHORT TERM GOAL #3   Title  Pt will improve BERG score to >/= 46/56    Baseline  41/56 significant falls risk    Time  4    Period  Weeks    Status  New    Target Date  06/08/17      PT SHORT TERM GOAL #4   Title  Pt will improve TUG, without use of cane) to </= 12 seconds    Baseline  14.1 seconds without cane    Time  4    Period  Weeks    Status  New    Target Date  06/08/17      PT SHORT TERM GOAL #5   Title  Pt will negotiate curb with cane and supervision    Time  4    Period  Weeks    Status  New    Target Date  06/08/17        PT Long Term Goals - 05/09/17 1705      PT LONG TERM GOAL #1   Title  Pt will demonstrate independence with balance, strengthening HEP    Time  12    Period  Weeks    Status  New    Target Date  08/07/17      PT LONG TERM GOAL #2   Title  Pt will improve BERG to >/= 50/56    Time  12    Period  Weeks    Status  New    Target Date  08/07/17      PT LONG TERM GOAL #3   Title  Pt will improve five time sit to stand </= 13 seconds with UE support    Time  12    Period  Weeks    Status  New    Target Date  08/07/17      PT LONG TERM GOAL #4   Title  Pt will ambulate x 200' over paved surfaces and up/down community curb with cane MOD I    Time  12    Period  Weeks    Status  New    Target Date  08/07/17  PT LONG TERM GOAL #5   Title  Pt will report being able to stand in kitchen to fix a meal x 10-15 minutes without using stool    Time  12    Period  Weeks    Status  New    Target Date  08/07/17            Plan - 05/14/17 1554    Clinical Impression Statement  Today's skilled session focused on improving balance and compensation for peripheral neuropathy. Pt experienced incr. postural sway during activities which require incr. vestibular input, therefore, PT incorporated balance activities to improve vestibular input. Pt  would continue to benefit from skilled PT to improve safety during functional mobilty.     Rehab Potential  Good    PT Frequency  2x / week    PT Duration  12 weeks will be on hold for a few weeks, return to [place]    PT Treatment/Interventions  ADLs/Self Care Home Management;Canalith Repostioning;Moist Heat;Cryotherapy;Electrical Stimulation;DME Instruction;Gait training;Stair training;Functional mobility training;Therapeutic activities;Therapeutic exercise;Balance training;Neuromuscular re-education;Patient/family education;Manual techniques;Passive range of motion;Taping;Vestibular    PT Next Visit Plan  Continue balance training focusing on compensation due to peripheral neuropathy, strengthening of core/LE's (avoid extension due to stenosis/disc bulging)    PT Home Exercise Plan  also needs core/back stabilization exercises, decompression exercises    Consulted and Agree with Plan of Care  Patient       Patient will benefit from skilled therapeutic intervention in order to improve the following deficits and impairments:  Abnormal gait, Decreased balance, Decreased strength, Difficulty walking, Dizziness, Increased edema, Impaired sensation, Pain  Visit Diagnosis: Unsteadiness on feet  Other abnormalities of gait and mobility  Repeated falls  Other disturbances of skin sensation     Problem List Patient Active Problem List   Diagnosis Date Noted  . Labyrinthitis 04/18/2017  . Need for assistance due to unsteady gait 04/18/2017  . Age-related osteoporosis without current pathological fracture 01/23/2017  . Acquired hypothyroidism 01/23/2017  . History of endometrial cancer 01/23/2017    Tareka Jhaveri L 05/14/2017, 4:03 PM  [facility] [address] [place], [place], [address] Phone: [phone]   Fax:  [phone]  Name: Katie [name] MRN: [id number] Date of Birth: 1940-02-23  Geoffry Paradise,  PT,DPT 05/14/17 4:04 PM Phone: [phone] Fax: [phone]

## 2017-05-14 NOTE — Patient Instructions (Addendum)
Perform in corner with a chair in front of you for OR at kitchen sink with chair behind you:  Feet Together (Compliant Surface) Varied Arm Positions - Eyes Closed    Stand on compliant surface: ___pillow/cushion_____ with feet together and arms at your side. Close eyes and visualize upright position. Hold__10-30__ seconds. Repeat _3___ times per session. Do ___1_ sessions per day.  Copyright  VHI. All rights reserved.   Feet Together (Compliant Surface) Head Motion - Eyes Open    With eyes open, standing on compliant surface: _pillow/cushion_______, feet together, move head slowly: up and down 10 times and side to side 10 times. Repeat __3__ times per session. Do __1__ sessions per day.  Copyright  VHI. All rights reserved.  Single Leg - Eyes Open    Holding support, lift right leg while maintaining balance over other leg. Progress to removing hands from support surface for longer periods of time. Hold__10__ seconds. Repeat with other leg.  Repeat __3__ times per session per leg. Do __1__ sessions per day.  Copyright  VHI. All rights reserved.

## 2017-05-15 ENCOUNTER — Ambulatory Visit: Payer: Self-pay | Admitting: *Deleted

## 2017-05-15 NOTE — Telephone Encounter (Signed)
Pt given home remedy advice .Advised to  speak to the  Pharmacist when getting the meds . Protocall reviewed with patient who verbalizes knowledge.    Reason for Disposition . [1] Nasal allergies AND [2] only certain times of year (hay fever)  Answer Assessment - Initial Assessment Questions 1. SYMPTOM: "What's the main symptom you're concerned about?" (e.g., runny nose, stuffiness, sneezing, itching)     Runny  Nose  Itchy  Eyes   Sneezing   2. SEVERITY: "How bad is it?" "What does it keep you from doing?" (e.g., sleeping, working)        Mild    3. EYES: "Are the eyes also red, watery, and itchy?"         Red  Watery  And itchy   4. TRIGGER: "What pollen or other allergic substance do you think is causing the symptoms?"           Pollen    5. TREATMENT: "What medicine are you using?" "What medicine worked best in the past?"           No   6. OTHER SYMPTOMS: "Do you have any other symptoms?" (e.g., coughing, difficulty breathing, wheezing)            no 7. PREGNANCY: "Is there any chance you are pregnant?" "When was your last menstrual period?"     n/a  Protocols used: NASAL ALLERGIES (HAY FEVER)-A-AH

## 2017-05-21 ENCOUNTER — Ambulatory Visit: Payer: Medicare Other | Admitting: Physical Therapy

## 2017-05-21 ENCOUNTER — Encounter: Payer: Self-pay | Admitting: Physical Therapy

## 2017-05-21 DIAGNOSIS — R2689 Other abnormalities of gait and mobility: Secondary | ICD-10-CM

## 2017-05-21 DIAGNOSIS — R42 Dizziness and giddiness: Secondary | ICD-10-CM

## 2017-05-21 DIAGNOSIS — R2681 Unsteadiness on feet: Secondary | ICD-10-CM | POA: Diagnosis not present

## 2017-05-21 DIAGNOSIS — R296 Repeated falls: Secondary | ICD-10-CM | POA: Diagnosis not present

## 2017-05-21 DIAGNOSIS — M6281 Muscle weakness (generalized): Secondary | ICD-10-CM

## 2017-05-21 DIAGNOSIS — R208 Other disturbances of skin sensation: Secondary | ICD-10-CM | POA: Diagnosis not present

## 2017-05-21 NOTE — Therapy (Signed)
[facility] [address] [place], [place], [address] Phone: [phone]   Fax:  [phone]  Physical Therapy Treatment  Patient Details  Name: Katie [name] MRN: [id number] Date of Birth: [birth year] Referring Provider: Libby Maw, MD   Encounter Date: 05/21/2017  PT End of Session - 05/21/17 1159    Visit Number  4    Number of Visits  17    Date for PT Re-Evaluation  08/07/17    Authorization Type  Medicare and [employer]    PT Start Time  1149    PT Stop Time  1230    PT Time Calculation (min)  41 min    Equipment Utilized During Treatment  Gait belt    Activity Tolerance  Patient tolerated treatment well    Behavior During Therapy  WFL for tasks assessed/performed       Past Medical History:  Diagnosis Date  . Arthritis   . Cancer ([place])   . Glaucoma   . Thyroid disease     Past Surgical History:  Procedure Laterality Date  . ABDOMINAL HYSTERECTOMY    . APPENDECTOMY    . BREAST SURGERY    . CHOLECYSTECTOMY      There were no vitals filed for this visit.  Subjective Assessment - 05/21/17 1154    Subjective  No new falls. Does need a new copy of HEP issued as last session, has misplaced her's.     Limitations  Standing;Walking    How long can you stand comfortably?  has to sit on stool in the kitchen    Patient Stated Goals  get a little stronger and decrease back pain    Currently in Pain?  Yes    Pain Score  4     Pain Location  Back    Pain Orientation  Lower    Pain Descriptors / Indicators  Aching;Dull    Pain Type  Chronic pain    Pain Onset  More than a month ago    Pain Frequency  Constant    Aggravating Factors   increased standing for long periods of time    Pain Relieving Factors  sitting              [facility] Adult PT Treatment/Exercise - 05/21/17 1200      Neuro Re-ed    Neuro Re-ed Details   reviewed HEP issued at previous session with min cues needed on  correct performance, min guard assist with occasional touch to walls/chair. most balance losses were anteior needing chair support.           Balance Exercises - 05/21/17 1213      Balance Exercises: Standing   Balance Beam  standing across blue foam beam: alternating forward stepping to floor/back onto beam, progressing to alternating backward stepping to floor/back onto beam, 10 reps each with no UE support. min guard to min assist for balance with cues for step length, step height and weight shifitng     Tandem Gait  Forward;Retro;Foam/compliant surface;3 reps;Limitations    Sidestepping  Foam/compliant support;3 reps;Limitations      Balance Exercises: Standing   Tandem Gait Limitations  on blue foam beam x 3 laps fwd/bwd with light UE support, cues on posture and step placement for heel to toe stepping    Sidestepping Limitations  on blue foam beam x 3 laps with light UE support on bars, cues on posture and ex form/technique  PT Short Term Goals - 05/09/17 1659      PT SHORT TERM GOAL #1   Title  Pt will demonstrate independence with initial HEP    Time  4    Period  Weeks    Status  New    Target Date  06/08/17      PT SHORT TERM GOAL #2   Title  Pt will improve five time sit to stand to </= 17 seconds with use of UE    Baseline  21.25    Time  4    Period  Weeks    Status  New    Target Date  06/08/17      PT SHORT TERM GOAL #3   Title  Pt will improve BERG score to >/= 46/56    Baseline  41/56 significant falls risk    Time  4    Period  Weeks    Status  New    Target Date  06/08/17      PT SHORT TERM GOAL #4   Title  Pt will improve TUG, without use of cane) to </= 12 seconds    Baseline  14.1 seconds without cane    Time  4    Period  Weeks    Status  New    Target Date  06/08/17      PT SHORT TERM GOAL #5   Title  Pt will negotiate curb with cane and supervision    Time  4    Period  Weeks    Status  New    Target Date  06/08/17         PT Long Term Goals - 05/09/17 1705      PT LONG TERM GOAL #1   Title  Pt will demonstrate independence with balance, strengthening HEP    Time  12    Period  Weeks    Status  New    Target Date  08/07/17      PT LONG TERM GOAL #2   Title  Pt will improve BERG to >/= 50/56    Time  12    Period  Weeks    Status  New    Target Date  08/07/17      PT LONG TERM GOAL #3   Title  Pt will improve five time sit to stand </= 13 seconds with UE support    Time  12    Period  Weeks    Status  New    Target Date  08/07/17      PT LONG TERM GOAL #4   Title  Pt will ambulate x 200' over paved surfaces and up/down community curb with cane MOD I    Time  12    Period  Weeks    Status  New    Target Date  08/07/17      PT LONG TERM GOAL #5   Title  Pt will report being able to stand in kitchen to fix a meal x 10-15 minutes without using stool    Time  12    Period  Weeks    Status  New    Target Date  08/07/17            Plan - 05/21/17 1159    Clinical Impression Statement  Today's skilled session focused on review (and re-issue of) HEP from last session. Remainder of session continued to address balance reactions and stepping strategies. Pt contineus to  be challenged with complaint surfaces and/or with vision removed. Pt is progressing toward goals and should benefit from continued PT to progress toward unmet goals.     Rehab Potential  Good    PT Frequency  2x / week    PT Duration  12 weeks will be on hold for a few weeks, return to [place]    PT Treatment/Interventions  ADLs/Self Care Home Management;Canalith Repostioning;Moist Heat;Cryotherapy;Electrical Stimulation;DME Instruction;Gait training;Stair training;Functional mobility training;Therapeutic activities;Therapeutic exercise;Balance training;Neuromuscular re-education;Patient/family education;Manual techniques;Passive range of motion;Taping;Vestibular    PT Next Visit Plan  Continue balance training focusing on  compensation due to peripheral neuropathy, strengthening of core/LE's (avoid extension due to stenosis/disc bulging)    PT Home Exercise Plan  also needs core/back stabilization exercises, decompression exercises    Consulted and Agree with Plan of Care  Patient       Patient will benefit from skilled therapeutic intervention in order to improve the following deficits and impairments:  Abnormal gait, Decreased balance, Decreased strength, Difficulty walking, Dizziness, Increased edema, Impaired sensation, Pain  Visit Diagnosis: Unsteadiness on feet  Other abnormalities of gait and mobility  Repeated falls  Muscle weakness (generalized)  Dizziness and giddiness     Problem List Patient Active Problem List   Diagnosis Date Noted  . Labyrinthitis 04/18/2017  . Need for assistance due to unsteady gait 04/18/2017  . Age-related osteoporosis without current pathological fracture 01/23/2017  . Acquired hypothyroidism 01/23/2017  . History of endometrial cancer 01/23/2017    Willow Ora, PTA, [facility] [address] [place], [place] [address] [phone] 05/21/17, 9:16 PM   Name: Katie [name] MRN: [id number] Date of Birth: 01/30/1941

## 2017-05-21 NOTE — Patient Instructions (Signed)
Perform in corner with a chair in front of you for OR at kitchen sink with chair behind you:  Feet Together (Compliant Surface) Varied Arm Positions - Eyes Closed    Stand on compliant surface: ___pillow/cushion_____ with feet together and arms at your side. Close eyes and visualize upright position. Hold__10-30__ seconds. Repeat _3___ times per session. Do ___1_ sessions per day.  Copyright  VHI. All rights reserved.   Feet Together (Compliant Surface) Head Motion - Eyes Open    With eyes open, standing on compliant surface: _pillow/cushion_______, feet together, move head slowly: up and down 10 times and side to side 10 times. Repeat __3__ times per session. Do __1__ sessions per day.  Copyright  VHI. All rights reserved.  Single Leg - Eyes Open    Holding support, lift right leg while maintaining balance over other leg. Progress to removing hands from support surface for longer periods of time. Hold__10__ seconds. Repeat with other leg.  Repeat __3__ times per session per leg. Do __1__ sessions per day.  Copyright  VHI. All rights reserved.

## 2017-05-23 ENCOUNTER — Encounter: Payer: Self-pay | Admitting: Physical Therapy

## 2017-05-23 ENCOUNTER — Ambulatory Visit: Payer: Medicare Other | Admitting: Physical Therapy

## 2017-05-23 DIAGNOSIS — R296 Repeated falls: Secondary | ICD-10-CM

## 2017-05-23 DIAGNOSIS — R2689 Other abnormalities of gait and mobility: Secondary | ICD-10-CM

## 2017-05-23 DIAGNOSIS — R2681 Unsteadiness on feet: Secondary | ICD-10-CM | POA: Diagnosis not present

## 2017-05-23 DIAGNOSIS — R42 Dizziness and giddiness: Secondary | ICD-10-CM | POA: Diagnosis not present

## 2017-05-23 DIAGNOSIS — R208 Other disturbances of skin sensation: Secondary | ICD-10-CM

## 2017-05-23 DIAGNOSIS — M6281 Muscle weakness (generalized): Secondary | ICD-10-CM | POA: Diagnosis not present

## 2017-05-23 NOTE — Therapy (Signed)
[facility] [address] [place], [place], [address] Phone: [phone]   Fax:  [phone]  Physical Therapy Treatment  Patient Details  Name: Katie [name] MRN: [id number] Date of Birth: 22-Apr-1940 Referring Provider: Libby Maw, MD   Encounter Date: 05/23/2017  PT End of Session - 05/23/17 2027    Visit Number  5    Number of Visits  17    Date for PT Re-Evaluation  08/07/17    Authorization Type  Medicare and [employer]    PT Start Time  1324 patient arrived late    PT Stop Time  1400    PT Time Calculation (min)  36 min    Equipment Utilized During Treatment  Gait belt    Activity Tolerance  Patient tolerated treatment well    Behavior During Therapy  WFL for tasks assessed/performed       Past Medical History:  Diagnosis Date  . Arthritis   . Cancer ([place])   . Glaucoma   . Thyroid disease     Past Surgical History:  Procedure Laterality Date  . ABDOMINAL HYSTERECTOMY    . APPENDECTOMY    . BREAST SURGERY    . CHOLECYSTECTOMY      There were no vitals filed for this visit.  Subjective Assessment - 05/23/17 1328    Subjective  Has been busy and wasn't able to perform HEP multiple times.  Brought in body pillow she began using last Thursday to review placement at night.    Limitations  Standing;Walking    How long can you stand comfortably?  has to sit on stool in the kitchen    Patient Stated Goals  get a little stronger and decrease back pain    Currently in Pain?  Yes    Pain Score  4     Pain Location  Back    Pain Orientation  Lower    Pain Descriptors / Indicators  Aching    Pain Type  Chronic pain    Pain Onset  More than a month ago                       [facility] Adult PT Treatment/Exercise - 05/23/17 1332      Self-Care   Self-Care  Other Self-Care Comments    Other Self-Care Comments   Reviewed pt positioning in bed with body pillow between LE and UE;  reports low back pain in the AM.  Recommended tucking a small pillow under mid and low back > pelvis for increased support to allow muscles to relax during sleep.  Pt reported improvement in comfort      Exercises   Exercises  Lumbar      Lumbar Exercises: Supine   Ab Set  10 reps;5 seconds    AB Set Limitations  supine on wedge due to back pain; TA activation training with diaphragmatic breathing; maintaining activation isometrically with breath and then adding in unilateral dynamic UE flexion<>extension to neutral, alternating UE with cues for sequencing             PT Education - 05/23/17 2026    Education provided  Yes    Education Details  placement of body pillow and extra pillow at low back for support    Person(s) Educated  Patient    Methods  Explanation;Demonstration    Comprehension  Verbalized understanding;Returned demonstration       PT Short Term Goals - 05/09/17  [place] #1   Title  Pt will demonstrate independence with initial HEP    Time  4    Period  Weeks    Status  New    Target Date  06/08/17      PT SHORT TERM GOAL #2   Title  Pt will improve five time sit to stand to </= 17 seconds with use of UE    Baseline  21.25    Time  4    Period  Weeks    Status  New    Target Date  06/08/17      PT SHORT TERM GOAL #3   Title  Pt will improve BERG score to >/= 46/56    Baseline  41/56 significant falls risk    Time  4    Period  Weeks    Status  New    Target Date  06/08/17      PT SHORT TERM GOAL #4   Title  Pt will improve TUG, without use of cane) to </= 12 seconds    Baseline  14.1 seconds without cane    Time  4    Period  Weeks    Status  New    Target Date  06/08/17      PT SHORT TERM GOAL #5   Title  Pt will negotiate curb with cane and supervision    Time  4    Period  Weeks    Status  New    Target Date  06/08/17        PT Long Term Goals - 05/09/17 1705      PT LONG TERM GOAL #1   Title  Pt will  demonstrate independence with balance, strengthening HEP    Time  12    Period  Weeks    Status  New    Target Date  08/07/17      PT LONG TERM GOAL #2   Title  Pt will improve BERG to >/= 50/56    Time  12    Period  Weeks    Status  New    Target Date  08/07/17      PT LONG TERM GOAL #3   Title  Pt will improve five time sit to stand </= 13 seconds with UE support    Time  12    Period  Weeks    Status  New    Target Date  08/07/17      PT LONG TERM GOAL #4   Title  Pt will ambulate x 200' over paved surfaces and up/down community curb with cane MOD I    Time  12    Period  Weeks    Status  New    Target Date  08/07/17      PT LONG TERM GOAL #5   Title  Pt will report being able to stand in kitchen to fix a meal x 10-15 minutes without using stool    Time  12    Period  Weeks    Status  New    Target Date  08/07/17            Plan - 05/23/17 2028    Clinical Impression Statement  Continued to discuss use of body pillow for improved hip and lumbar spine alignment during side sleeping; also advised placing small pillow tucked under low back and pelvis to provide further support and  decrease pain.  With pt in supine on wedge, initiated isometric core activation and TA activation training.  Will continue to address and incorporate with seated and standing activities as pt progresses.    Rehab Potential  Good    PT Frequency  2x / week    PT Duration  12 weeks will be on hold for a few weeks, return to [place]    PT Treatment/Interventions  ADLs/Self Care Home Management;Canalith Repostioning;Moist Heat;Cryotherapy;Electrical Stimulation;DME Instruction;Gait training;Stair training;Functional mobility training;Therapeutic activities;Therapeutic exercise;Balance training;Neuromuscular re-education;Patient/family education;Manual techniques;Passive range of motion;Taping;Vestibular    PT Next Visit Plan  Continue balance training focusing on compensation due to peripheral  neuropathy, strengthening of core/LE's (avoid extension due to stenosis/disc bulging), curb with cane    PT Home Exercise Plan  also needs core/back stabilization exercises, decompression exercises    Consulted and Agree with Plan of Care  Patient       Patient will benefit from skilled therapeutic intervention in order to improve the following deficits and impairments:  Abnormal gait, Decreased balance, Decreased strength, Difficulty walking, Dizziness, Increased edema, Impaired sensation, Pain  Visit Diagnosis: Unsteadiness on feet  Other abnormalities of gait and mobility  Repeated falls  Muscle weakness (generalized)  Other disturbances of skin sensation     Problem List Patient Active Problem List   Diagnosis Date Noted  . Labyrinthitis 04/18/2017  . Need for assistance due to unsteady gait 04/18/2017  . Age-related osteoporosis without current pathological fracture 01/23/2017  . Acquired hypothyroidism 01/23/2017  . History of endometrial cancer 01/23/2017    Rico Junker, PT, DPT 05/23/17    8:35 PM    [facility] [address] [place], [place], [address] Phone: [phone]   Fax:  [phone]  Name: Katie [name] MRN: [id number] Date of Birth: Dec 07, 1940

## 2017-05-27 ENCOUNTER — Encounter: Payer: Self-pay | Admitting: Family Medicine

## 2017-05-27 ENCOUNTER — Ambulatory Visit (INDEPENDENT_AMBULATORY_CARE_PROVIDER_SITE_OTHER): Payer: Medicare Other | Admitting: Family Medicine

## 2017-05-27 VITALS — BP 140/78 | HR 77 | Ht 60.0 in | Wt 145.4 lb

## 2017-05-27 DIAGNOSIS — M1711 Unilateral primary osteoarthritis, right knee: Secondary | ICD-10-CM | POA: Diagnosis not present

## 2017-05-27 DIAGNOSIS — Z8542 Personal history of malignant neoplasm of other parts of uterus: Secondary | ICD-10-CM

## 2017-05-27 DIAGNOSIS — K520 Gastroenteritis and colitis due to radiation: Secondary | ICD-10-CM

## 2017-05-27 NOTE — Progress Notes (Signed)
Subjective:  Patient ID: Katie [name], female    DOB: [birth year]  Age: 77 y.o. MRN: [id number]  CC: Follow-up   HPI Katie [name] presents for for a referral for Synvisc injections.  She has a history of severe osteoarthritis involving her right knee.  She is the sole caregiver of her husband who is in the early stages of dementia and is reluctant to have her knee replaced as has been recommended.  She has had 3 series of Synvisc injections and requests another series.  She has had an ongoing history of disruption of her bowel habits status post extensive radiation therapy for her endometrial cancer that was completed back in 2009.  She denies crampy abdominal pain relieved with stooling.  She denies weight loss blood blood or pus in her stool.  Her colonoscopies have been without polyps she says.  She tells that it takes her almost an hour to complete stooling each morning.  She adamantly denies constipation and tells that when she defecates and believe that she is finished, she then has to come back to the toilet to defecate again.  This is an ongoing pattern for her.  She wonders if there is anything to do about this.  Her stools are formed first but then turned loose by the time that she is finally finished stooling.  She is under a great deal of perceived stress in dealing with her husband's memory issues.  She is compliant with her Synthroid and her last TSH measured few weeks ago was 3.6.  Outpatient Medications Prior to Visit  Medication Sig Dispense Refill  . alendronate (FOSAMAX) 70 MG tablet Take 1 tablet (70 mg total) by mouth every 7 (seven) days. Take with a full glass of water on an empty stomach. 4 tablet 11  . bimatoprost (LUMIGAN) 0.01 % SOLN 1 drop at bedtime.    Marland Kitchen BIOTIN PO Take 1 tablet by mouth daily.    . brimonidine (ALPHAGAN P) 0.1 % SOLN Use in the morning and evening.    . Calcium Citrate-Vitamin D (CALCIUM CITRATE + D PO) Take by mouth 2 (two) times daily.    Marland Kitchen  esomeprazole (NEXIUM) 20 MG capsule Take 20 mg by mouth daily at 12 noon.    Marland Kitchen levothyroxine (SYNTHROID, LEVOTHROID) 50 MCG tablet Take 1 tablet (50 mcg total) by mouth daily before breakfast. 100 tablet 0  . pyridOXINE (VITAMIN B-6) 100 MG tablet Take 100 mg by mouth daily.     No facility-administered medications prior to visit.     ROS Review of Systems  Constitutional: Negative.   HENT: Negative.   Eyes: Negative.   Respiratory: Negative.   Cardiovascular: Negative.   Gastrointestinal: Negative for abdominal distention, abdominal pain, anal bleeding, blood in stool, constipation, diarrhea, nausea, rectal pain and vomiting.  Skin: Negative.   Neurological: Negative.   Hematological: Negative.   Psychiatric/Behavioral: Negative.     Objective:  BP 140/78 (BP Location: Right Arm, Patient Position: Sitting, Cuff Size: Normal)   Pulse 77   Ht 5' (1.524 m)   Wt 145 lb 6 oz (65.9 kg)   SpO2 96%   BMI 28.39 kg/m   BP Readings from Last 3 Encounters:  05/27/17 140/78  04/18/17 138/72  04/09/17 (!) 167/99    Wt Readings from Last 3 Encounters:  05/27/17 145 lb 6 oz (65.9 kg)  04/18/17 146 lb (66.2 kg)  02/25/17 147 lb 2 oz (66.7 kg)    Physical Exam  Constitutional: She is oriented  to person, place, and time. She appears well-developed and well-nourished. No distress.  HENT:  Head: Normocephalic and atraumatic.  Right Ear: External ear normal.  Left Ear: External ear normal.  Eyes: Right eye exhibits no discharge. Left eye exhibits no discharge. No scleral icterus.  Neck: No JVD present. No tracheal deviation present.  Pulmonary/Chest: Effort normal.  Neurological: She is alert and oriented to person, place, and time.  Skin: She is not diaphoretic.  Psychiatric: She has a normal mood and affect. Her behavior is normal.    Lab Results  Component Value Date   TSH 3.61 02/25/2017    Patient was never admitted.  Assessment & Plan:   Katie [name] was seen today for  follow-up.  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  No orders of the defined types were placed in this encounter.    Follow-up: Return in about 3 months (around 08/26/2017), or if symptoms worsen or fail to improve.  Katie Maw, MD

## 2017-05-28 ENCOUNTER — Encounter: Payer: Self-pay | Admitting: Nurse Practitioner

## 2017-05-30 ENCOUNTER — Encounter: Payer: Self-pay | Admitting: Physical Therapy

## 2017-05-30 ENCOUNTER — Encounter: Payer: Self-pay | Admitting: Gastroenterology

## 2017-05-30 ENCOUNTER — Ambulatory Visit (INDEPENDENT_AMBULATORY_CARE_PROVIDER_SITE_OTHER): Payer: Medicare Other | Admitting: Gastroenterology

## 2017-05-30 ENCOUNTER — Ambulatory Visit: Payer: Medicare Other | Admitting: Physical Therapy

## 2017-05-30 VITALS — BP 124/80 | HR 74 | Ht 60.0 in | Wt 145.0 lb

## 2017-05-30 DIAGNOSIS — K529 Noninfective gastroenteritis and colitis, unspecified: Secondary | ICD-10-CM | POA: Insufficient documentation

## 2017-05-30 DIAGNOSIS — R195 Other fecal abnormalities: Secondary | ICD-10-CM

## 2017-05-30 DIAGNOSIS — M6281 Muscle weakness (generalized): Secondary | ICD-10-CM | POA: Diagnosis not present

## 2017-05-30 DIAGNOSIS — R2681 Unsteadiness on feet: Secondary | ICD-10-CM

## 2017-05-30 DIAGNOSIS — R208 Other disturbances of skin sensation: Secondary | ICD-10-CM

## 2017-05-30 DIAGNOSIS — R296 Repeated falls: Secondary | ICD-10-CM

## 2017-05-30 DIAGNOSIS — R42 Dizziness and giddiness: Secondary | ICD-10-CM | POA: Diagnosis not present

## 2017-05-30 DIAGNOSIS — R2689 Other abnormalities of gait and mobility: Secondary | ICD-10-CM

## 2017-05-30 MED ORDER — CHOLESTYRAMINE 4 G PO PACK: 4.0000 g | PACK | Freq: Two times a day (BID) | ORAL | 2 refills | Status: DC

## 2017-05-30 NOTE — Patient Instructions (Signed)
Sit to Stand: Easy    SIT IN DINING ROOM CHAIR, BOTH HANDS ON CANE IN FRONT OF YOU, erect posture, feet on floor. Leaning at hips, shift weight forward. Push on CANE to stand. LEAN FORWARD TO SIT BACK DOWN.  Repeat _10-12__ times. Do __2_ sessions per day.

## 2017-05-30 NOTE — Progress Notes (Signed)
05/30/2017 Katie [name] [id number] 02-17-1940   HISTORY OF PRESENT ILLNESS:  This is a pleasant 77 year old female who is new to our practice.  She was referred here by her PCP, Dr. Ethelene Hal, for evaluation regarding chronic loose stools.  She really has no chronic medical problems, but did have endometrial cancer for which she underwent extensive surgery, intense radiation therapy, and chemotherapy in 2010-2011.  She said that starting in 2011 she began having loose stools with urgency and intermittent incontinence.  She blames the symptoms on consequences of her radiation, chemo, and surgery.  She says that she had a colonoscopy in 2010 that was normal and that she actually asked for repeat in 2011 when she started having the symptoms and they told her that there was some "damage" from her radiation.  She has just lived with the symptoms since that time.  She tells me that she has actually had 3 or 4 colonoscopies total and has never had any polyps.  She had a cholecystectomy at the same time as her gynecologic surgery.  Anyway, they just relocated to [facility] in November 2018 from [place], [place] area.  She just wanted to see a gastroenterologist and see if there is anything else that we could do to help her manage her symptoms.  She was questioning if she does have irritable bowel syndrome.  She had a recent TSH that was normal.  She brought her labs to show myself and her new PCP from October 2018 at which time CBC and CMP were normal.  She tells me that her symptoms are not really diarrhea, just loose stools sometimes just very soft stools.  This is usually mostly in the mornings and it will start out with a formed stool and then be very loose or soft.  Most the time then she does not have any bowel movements the rest of the day, but other times she will have a handful of very soft or loose stools during the day.  She has rare nocturnal stools.  She does have urgency when she has to go and  has incontinence a lot of times and does not even know she has had incontinence at times.  She knows that her pelvic muscles and pelvic floor along the sphincter tone are probably nonexistent.  She denies any blood in her stool.  She denies any significant pain or discomfort.    Past Medical History:  Diagnosis Date  . Arthritis   . Cancer ([place])   . Glaucoma   . Thyroid disease    Past Surgical History:  Procedure Laterality Date  . ABDOMINAL HYSTERECTOMY    . APPENDECTOMY    . BREAST SURGERY    . CHOLECYSTECTOMY      reports that she has never smoked. She has never used smokeless tobacco. She reports that she does not drink alcohol or use drugs. family history includes Cancer in her paternal grandfather and sister; Hearing loss in her maternal grandfather and maternal grandmother; Heart disease in her father. Allergies  Allergen Reactions  . Gabapentin   . Lyrica [Pregabalin]   . Sulfa Antibiotics       Outpatient Encounter Medications as of 05/30/2017  Medication Sig  . alendronate (FOSAMAX) 70 MG tablet Take 1 tablet (70 mg total) by mouth every 7 (seven) days. Take with a full glass of water on an empty stomach.  . bimatoprost (LUMIGAN) 0.01 % SOLN 1 drop at bedtime.  Marland Kitchen BIOTIN PO Take 1 tablet by  mouth daily.  . brimonidine (ALPHAGAN P) 0.1 % SOLN Use in the morning and evening.  . Calcium Citrate-Vitamin D (CALCIUM CITRATE + D PO) Take by mouth 2 (two) times daily.  . cetirizine (ZYRTEC) 10 MG chewable tablet Chew 10 mg by mouth daily.  Marland Kitchen esomeprazole (NEXIUM) 20 MG capsule Take 20 mg by mouth daily at 12 noon.  Marland Kitchen levothyroxine (SYNTHROID, LEVOTHROID) 50 MCG tablet Take 1 tablet (50 mcg total) by mouth daily before breakfast.  . pyridOXINE (VITAMIN B-6) 100 MG tablet Take 100 mg by mouth daily.   No facility-administered encounter medications on file as of 05/30/2017.      REVIEW OF SYSTEMS  : All other systems reviewed and negative except where noted in the History of  Present Illness.   PHYSICAL EXAM: BP 124/80   Pulse 74   Ht 5' (1.524 m)   Wt 145 lb (65.8 kg)   BMI 28.32 kg/m  General: Well developed white female in no acute distress Head: Normocephalic and atraumatic Eyes:  Sclerae anicteric, conjunctiva pink. Ears: Normal auditory acuity Lungs: Clear throughout to auscultation; no increased WOB. Heart: Regular rate and rhythm; no M/R/G. Abdomen: Soft, non-distended.  BS present.  Non-tender. Musculoskeletal: Symmetrical with no gross deformities  Skin: No lesions on visible extremities Extremities: No edema  Neurological: Alert oriented x 4, grossly non-focal Psychological:  Alert and cooperative. Normal mood and affect  ASSESSMENT AND PLAN: *Chronic loose stools:  Not diarrhea, just loose and usually with urgency and sometimes incontinence.  She had extensive surgery, chemo, and radiation for endometrial cancer in 2010-2011.  Symptoms have been present since 2011 and she plans all of that for causing damage to her intestines and pelvic muscles/pelvic floor.  I think that is very likely contributing (may have radiation colitis or enteritis).  May also have a component of bile salt related diarrhea.  Will obtain previous colonoscopy records form 2010 and 2011.  Will try questran one packet daily and increase to twice daily if needed.  Regardless of the diagnosis I think that it will just be a matter of trying to help her manage her symptoms.  She will call back in about 4 weeks with an update on her symptoms.  Will obtain previous colonoscopy records.   CC:  Libby Maw,*

## 2017-05-30 NOTE — Therapy (Signed)
[facility] [address] [place], [place], [address] Phone: [phone]   Fax:  [phone]  Physical Therapy Treatment  Patient Details  Name: Katie [name] MRN: [id number] Date of Birth: [birth year] Referring Provider: Libby Maw, MD   Encounter Date: 05/30/2017  PT End of Session - 05/30/17 1648    Visit Number  6    Number of Visits  17    Date for PT Re-Evaluation  08/07/17    Authorization Type  Medicare and [employer]    PT Start Time  1449    PT Stop Time  1530    PT Time Calculation (min)  41 min    Activity Tolerance  Patient tolerated treatment well    Behavior During Therapy  [facility] for tasks assessed/performed       Past Medical History:  Diagnosis Date  . Arthritis   . Cancer (HCC)    Endometrial  . Glaucoma   . Thyroid disease     Past Surgical History:  Procedure Laterality Date  . ABDOMINAL HYSTERECTOMY    . APPENDECTOMY    . BREAST SURGERY    . CHOLECYSTECTOMY      There were no vitals filed for this visit.  Subjective Assessment - 05/30/17 1454    Subjective  Body pillow is working well, no discomfort when she wakes up now.  Has had more opportunities to perform exercises    Pertinent History  hypothyroidism, vertigo with questionable labyrinthitis, osteoporosis, endometrial cancer with chemo and radiation, peripheral neuropathy, OA, chronic back pain and fall with L hip fracture requiring fixation    Limitations  Standing;Walking    How long can you stand comfortably?  has to sit on stool in the kitchen    Patient Stated Goals  get a little stronger and decrease back pain    Pain Score  4     Pain Location  Back    Pain Orientation  Lower    Pain Descriptors / Indicators  Aching    Pain Onset  More than a month ago                            Balance Exercises - 05/30/17 1508      Balance Exercises: Standing   Wall Bumps  Hip    Wall  Bumps-Hips  Eyes opened;Anterior/posterior;10 reps solid surface, ankle and hip    Rockerboard  Anterior/posterior;EO;EC;UE support Ankle/hip strategy; perturbations without UE support    Sit to Stand Time  10 reps from chair with UE support on cane in front of her leaning cane forwards during sit > stand >sit for increased forward lean and weight shift        PT Education - 05/30/17 1648    Education provided  Yes    Education Details  added sit <> stand to HEP, ankle and hip balance reactions    Person(s) Educated  Patient    Methods  Explanation;Demonstration;Handout    Comprehension  Verbalized understanding;Returned demonstration       PT Short Term Goals - 05/09/17 1659      PT SHORT TERM GOAL #1   Title  Pt will demonstrate independence with initial HEP    Time  4    Period  Weeks    Status  New    Target Date  06/08/17      PT SHORT TERM GOAL #2   Title  Pt will  improve five time sit to stand to </= 17 seconds with use of UE    Baseline  21.25    Time  4    Period  Weeks    Status  New    Target Date  06/08/17      PT SHORT TERM GOAL #3   Title  Pt will improve BERG score to >/= 46/56    Baseline  41/56 significant falls risk    Time  4    Period  Weeks    Status  New    Target Date  06/08/17      PT SHORT TERM GOAL #4   Title  Pt will improve TUG, without use of cane) to </= 12 seconds    Baseline  14.1 seconds without cane    Time  4    Period  Weeks    Status  New    Target Date  06/08/17      PT SHORT TERM GOAL #5   Title  Pt will negotiate curb with cane and supervision    Time  4    Period  Weeks    Status  New    Target Date  06/08/17        PT Long Term Goals - 05/09/17 1705      PT LONG TERM GOAL #1   Title  Pt will demonstrate independence with balance, strengthening HEP    Time  12    Period  Weeks    Status  New    Target Date  08/07/17      PT LONG TERM GOAL #2   Title  Pt will improve BERG to >/= 50/56    Time  12    Period   Weeks    Status  New    Target Date  08/07/17      PT LONG TERM GOAL #3   Title  Pt will improve five time sit to stand </= 13 seconds with UE support    Time  12    Period  Weeks    Status  New    Target Date  08/07/17      PT LONG TERM GOAL #4   Title  Pt will ambulate x 200' over paved surfaces and up/down community curb with cane MOD I    Time  12    Period  Weeks    Status  New    Target Date  08/07/17      PT LONG TERM GOAL #5   Title  Pt will report being able to stand in kitchen to fix a meal x 10-15 minutes without using stool    Time  12    Period  Weeks    Status  New    Target Date  08/07/17            Plan - 05/30/17 1649    Clinical Impression Statement  To improve functional weight shift and LE strength educated pt on sit <> stand exercise with bilat UE support on cane rocking cane forwards to facilitate increased forward weight shift.  Added to HEP.  Continued with dynamic balance and ankl;e/hip strategy training for balance reactions on unstable surface and then at wall for pt to practice at home.  Pt tolerated well with no increase in knee pain.  Will continue to progress towards LTG.    Rehab Potential  Good    PT Frequency  2x / week    PT  Duration  12 weeks will be on hold for a few weeks, return to [place]    PT Treatment/Interventions  ADLs/Self Care Home Management;Canalith Repostioning;Moist Heat;Cryotherapy;Electrical Stimulation;DME Instruction;Gait training;Stair training;Functional mobility training;Therapeutic activities;Therapeutic exercise;Balance training;Neuromuscular re-education;Patient/family education;Manual techniques;Passive range of motion;Taping;Vestibular    PT Next Visit Plan  progress wall bumps to eyes closed/compliant surface, balance beam/compliant surfaces, progress corner balance, strengthening of core/LE's (avoid extension due to stenosis/disc bulging), curb with cane    PT Home Exercise Plan  also needs core/back stabilization  exercises, decompression exercises    Consulted and Agree with Plan of Care  Patient       Patient will benefit from skilled therapeutic intervention in order to improve the following deficits and impairments:  Abnormal gait, Decreased balance, Decreased strength, Difficulty walking, Dizziness, Increased edema, Impaired sensation, Pain  Visit Diagnosis: Unsteadiness on feet  Other abnormalities of gait and mobility  Repeated falls  Other disturbances of skin sensation  Muscle weakness (generalized)     Problem List Patient Active Problem List   Diagnosis Date Noted  . Loose stools 05/30/2017  . Chronic diarrhea 05/30/2017  . 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 Osteoarthritis of right knee 05/27/2017  . Labyrinthitis 04/18/2017  . Need for assistance due to unsteady gait 04/18/2017  . Age-related osteoporosis without current pathological fracture 01/23/2017  . Acquired hypothyroidism 01/23/2017  . History of endometrial cancer 01/23/2017    Rico Junker, PT, DPT 05/30/17    4:55 PM    [facility] [address] [place], [place], [address] Phone: [phone]   Fax:  [phone]  Name: Katie [name] MRN: [id number] Date of Birth: 04-May-1940

## 2017-05-30 NOTE — Patient Instructions (Signed)
We have sent the following medications to your pharmacy for you to pick up at your convenience: Questran 4 mg once a day and build up to twice a day.   Please call the office with an update of your symptoms in the next four week and ask for Katina Degree, RN.

## 2017-05-30 NOTE — Progress Notes (Signed)
Reviewed and agree with documentation and assessment and plan. K. Veena Mariya Mottley , MD   

## 2017-05-31 ENCOUNTER — Ambulatory Visit: Payer: Medicare Other | Admitting: Physical Therapy

## 2017-05-31 DIAGNOSIS — R2689 Other abnormalities of gait and mobility: Secondary | ICD-10-CM

## 2017-05-31 DIAGNOSIS — M6281 Muscle weakness (generalized): Secondary | ICD-10-CM | POA: Diagnosis not present

## 2017-05-31 DIAGNOSIS — R208 Other disturbances of skin sensation: Secondary | ICD-10-CM

## 2017-05-31 DIAGNOSIS — R296 Repeated falls: Secondary | ICD-10-CM | POA: Diagnosis not present

## 2017-05-31 DIAGNOSIS — R2681 Unsteadiness on feet: Secondary | ICD-10-CM

## 2017-05-31 DIAGNOSIS — R42 Dizziness and giddiness: Secondary | ICD-10-CM | POA: Diagnosis not present

## 2017-05-31 NOTE — Patient Instructions (Signed)
Weight Shift: Anterior / Posterior (Righting / Equilibrium)    With feet closer to wall; using ankles lean slowly away from the wall and then back to the wall.  10 times.  Move feet further from wall: Slowly shift weight forward, arms back and hips forward over toes, until heels rise off floor. Return to starting position. Shift weight backward, arms forward and hips back over heels, until toes rise off floor. Repeat __10__ times per session. Do _2___ sessions per day.  Copyright  VHI. All rights reserved.

## 2017-05-31 NOTE — Therapy (Signed)
[facility] [address] [place], [place], [address] Phone: [phone]   Fax:  [phone]  Physical Therapy Treatment  Patient Details  Name: Katie [name] MRN: [id number] Date of Birth: Aug 21, 1940 Referring Provider: Libby Maw, MD   Encounter Date: 05/31/2017  PT End of Session - 05/31/17 1558    Visit Number  7    Number of Visits  17    Date for PT Re-Evaluation  08/07/17    Authorization Type  Medicare and [employer]    PT Start Time  1452    PT Stop Time  1537    PT Time Calculation (min)  45 min    Activity Tolerance  Patient tolerated treatment well    Behavior During Therapy  [facility] for tasks assessed/performed       Past Medical History:  Diagnosis Date  . Arthritis   . Cancer (HCC)    Endometrial  . Glaucoma   . Thyroid disease     Past Surgical History:  Procedure Laterality Date  . ABDOMINAL HYSTERECTOMY    . APPENDECTOMY    . BREAST SURGERY    . CHOLECYSTECTOMY      There were no vitals filed for this visit.  Subjective Assessment - 05/31/17 1500    Subjective  Gets injection in knee on Monday.  Cane exercise for sit <> stand is working well.    Pertinent History  hypothyroidism, vertigo with questionable labyrinthitis, osteoporosis, endometrial cancer with chemo and radiation, peripheral neuropathy, OA, chronic back pain and fall with L hip fracture requiring fixation    Limitations  Standing;Walking    How long can you stand comfortably?  has to sit on stool in the kitchen    Patient Stated Goals  get a little stronger and decrease back pain    Currently in Pain?  Yes    Pain Score  4     Pain Location  Back    Pain Orientation  Lower    Pain Onset  More than a month ago                            Balance Exercises - 05/31/17 1521      Balance Exercises: Standing   Wall Bumps  Hip    Wall Bumps-Hips  Eyes opened;Eyes  closed;Anterior/posterior;Foam/compliant surface;10 reps EC on solid surface, EO on compliant surface, ankle and hip    Balance Beam  standing across blue foam beam side stepping to L and R x 2 reps each direction; forwards step up onto beam, down forwards.  Stepping retro back up onto beam and then retro back down x 10 reps with min A        PT Education - 05/31/17 1558    Education provided  Yes    Education Details  wall bumps added to HEP    Person(s) Educated  Patient    Methods  Explanation    Comprehension  Verbalized understanding       PT Short Term Goals - 05/09/17 1659      PT SHORT TERM GOAL #1   Title  Pt will demonstrate independence with initial HEP    Time  4    Period  Weeks    Status  New    Target Date  06/08/17      PT SHORT TERM GOAL #2   Title  Pt will improve five time sit to stand  to </= 17 seconds with use of UE    Baseline  21.25    Time  4    Period  Weeks    Status  New    Target Date  06/08/17      PT SHORT TERM GOAL #3   Title  Pt will improve BERG score to >/= 46/56    Baseline  41/56 significant falls risk    Time  4    Period  Weeks    Status  New    Target Date  06/08/17      PT SHORT TERM GOAL #4   Title  Pt will improve TUG, without use of cane) to </= 12 seconds    Baseline  14.1 seconds without cane    Time  4    Period  Weeks    Status  New    Target Date  06/08/17      PT SHORT TERM GOAL #5   Title  Pt will negotiate curb with cane and supervision    Time  4    Period  Weeks    Status  New    Target Date  06/08/17        PT Long Term Goals - 05/09/17 1705      PT LONG TERM GOAL #1   Title  Pt will demonstrate independence with balance, strengthening HEP    Time  12    Period  Weeks    Status  New    Target Date  08/07/17      PT LONG TERM GOAL #2   Title  Pt will improve BERG to >/= 50/56    Time  12    Period  Weeks    Status  New    Target Date  08/07/17      PT LONG TERM GOAL #3   Title  Pt will  improve five time sit to stand </= 13 seconds with UE support    Time  12    Period  Weeks    Status  New    Target Date  08/07/17      PT LONG TERM GOAL #4   Title  Pt will ambulate x 200' over paved surfaces and up/down community curb with cane MOD I    Time  12    Period  Weeks    Status  New    Target Date  08/07/17      PT LONG TERM GOAL #5   Title  Pt will report being able to stand in kitchen to fix a meal x 10-15 minutes without using stool    Time  12    Period  Weeks    Status  New    Target Date  08/07/17            Plan - 05/31/17 1558    Clinical Impression Statement  Continued to focus on ankle and hip strategies for balance reaction training with wall bumps adding in compliant surface and removing visual input.  With eyes closed and on compliant surface pt presented with veering to the R.  Continued balance training on compliant blue beam during side stepping and forwards and retro stepping.  Pt tolerated well with no increase in pain.  Will continue to progress towards LTG.    Rehab Potential  Good    PT Frequency  2x / week    PT Duration  12 weeks will be on hold for a few weeks,  return to [place]    PT Treatment/Interventions  ADLs/Self Care Home Management;Canalith Repostioning;Moist Heat;Cryotherapy;Electrical Stimulation;DME Instruction;Gait training;Stair training;Functional mobility training;Therapeutic activities;Therapeutic exercise;Balance training;Neuromuscular re-education;Patient/family education;Manual techniques;Passive range of motion;Taping;Vestibular    PT Next Visit Plan  STG due 5/4; multi-sensory balance training on balance beam/compliant surfaces, progress corner balance, strengthening of core/LE's (avoid extension due to stenosis/disc bulging), curb with cane    PT Home Exercise Plan  also needs core/back stabilization exercises, decompression exercises    Consulted and Agree with Plan of Care  Patient       Patient will benefit from skilled  therapeutic intervention in order to improve the following deficits and impairments:  Abnormal gait, Decreased balance, Decreased strength, Difficulty walking, Dizziness, Increased edema, Impaired sensation, Pain  Visit Diagnosis: Unsteadiness on feet  Other abnormalities of gait and mobility  Repeated falls  Other disturbances of skin sensation  Muscle weakness (generalized)     Problem List Patient Active Problem List   Diagnosis Date Noted  . Loose stools 05/30/2017  . Chronic diarrhea 05/30/2017  . 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 Osteoarthritis of right knee 05/27/2017  . Labyrinthitis 04/18/2017  . Need for assistance due to unsteady gait 04/18/2017  . Age-related osteoporosis without current pathological fracture 01/23/2017  . Acquired hypothyroidism 01/23/2017  . History of endometrial cancer 01/23/2017    Rico Junker, PT, DPT 05/31/17    4:09 PM    [facility] [address] [place], [place], [address] Phone: [phone]   Fax:  [phone]  Name: Katie [name] MRN: [id number] Date of Birth: 02-Dec-1940

## 2017-06-03 ENCOUNTER — Ambulatory Visit: Payer: Medicare Other | Admitting: Physical Therapy

## 2017-06-03 ENCOUNTER — Ambulatory Visit (INDEPENDENT_AMBULATORY_CARE_PROVIDER_SITE_OTHER): Payer: Medicare Other

## 2017-06-03 ENCOUNTER — Encounter: Payer: Self-pay | Admitting: Family Medicine

## 2017-06-03 ENCOUNTER — Ambulatory Visit (INDEPENDENT_AMBULATORY_CARE_PROVIDER_SITE_OTHER): Payer: Medicare Other | Admitting: Family Medicine

## 2017-06-03 VITALS — BP 128/64 | HR 66 | Temp 97.8°F | Ht 60.0 in | Wt 145.0 lb

## 2017-06-03 DIAGNOSIS — M25561 Pain in right knee: Secondary | ICD-10-CM

## 2017-06-03 DIAGNOSIS — M179 Osteoarthritis of knee, unspecified: Secondary | ICD-10-CM | POA: Insufficient documentation

## 2017-06-03 DIAGNOSIS — M1710 Unilateral primary osteoarthritis, unspecified knee: Secondary | ICD-10-CM | POA: Insufficient documentation

## 2017-06-03 NOTE — Progress Notes (Signed)
Katie [name] - 77 y.o. female MRN [id number]  Date of birth: 07/13/1940  SUBJECTIVE:  Including CC & ROS.  Chief Complaint  Patient presents with  . Right knee pain    Katie [name] is a 77 y.o. female that is presenting with right knee pain. Pain has been acute on chronic. She has been receiving Synvisc injections previously in [place]. She did have improvement in her pain. She moved here recently from [place] in November. Flexion and extension trigger mild to severe pain. She has been wearing a knee brace daily. She uses a cane to ambulate. She has been completing physical therapy for balance issues. She has not been taking anything for the pain.  Denies injury or surgeries. Admits to swelling and tenderness. Denies any injury or surgery. Pain is localized to the knee.   She finished her Synvisc injections of October of last year.  Reports that her x-rays have shown bone-on-bone arthritis.  She is a candidate for total knee arthroplasty but is putting that off while she takes care of her husband.   Review of Systems  Constitutional: Negative for fever.  HENT: Negative for congestion.   Respiratory: Negative for cough.   Cardiovascular: Negative for chest pain.  Gastrointestinal: Negative for abdominal pain.  Musculoskeletal: Positive for back pain and gait problem.  Skin: Negative for color change.  Neurological: Negative for weakness.  Hematological: Negative for adenopathy.  Psychiatric/Behavioral: Negative for agitation.    HISTORY: Past Medical, Surgical, Social, and Family History Reviewed & Updated per EMR.   Pertinent Historical Findings include:  Past Medical History:  Diagnosis Date  . Arthritis   . Cancer (HCC)    Endometrial  . Glaucoma   . Thyroid disease     Past Surgical History:  Procedure Laterality Date  . ABDOMINAL HYSTERECTOMY    . APPENDECTOMY    . BREAST SURGERY    . CHOLECYSTECTOMY    . FEMUR SURGERY  06/2013    Allergies    Allergen Reactions  . Gabapentin   . Lyrica [Pregabalin]   . Sulfa Antibiotics     Family History  Problem Relation Age of Onset  . Heart disease Father   . Hearing loss Maternal Grandmother   . Hearing loss Maternal Grandfather   . Cancer Paternal Grandfather   . Cancer Sister      Social History   Socioeconomic History  . Marital status: Married    Spouse name: Not on file  . Number of children: Not on file  . Years of education: Not on file  . Highest education level: Not on file  Occupational History  . Not on file  Social Needs  . Financial resource strain: Not on file  . Food insecurity:    Worry: Not on file    Inability: Not on file  . Transportation needs:    Medical: Not on file    Non-medical: Not on file  Tobacco Use  . Smoking status: Never Smoker  . Smokeless tobacco: Never Used  Substance and Sexual Activity  . Alcohol use: No    Frequency: Never  . Drug use: No  . Sexual activity: Not on file  Lifestyle  . Physical activity:    Days per week: Not on file    Minutes per session: Not on file  . Stress: Not on file  Relationships  . Social connections:    Talks on phone: Not on file    Gets together: Not on file    Attends  religious service: Not on file    Active member of club or organization: Not on file    Attends meetings of clubs or organizations: Not on file    Relationship status: Not on file  . Intimate partner violence:    Fear of current or ex partner: Not on file    Emotionally abused: Not on file    Physically abused: Not on file    Forced sexual activity: Not on file  Other Topics Concern  . Not on file  Social History Narrative  . Not on file     PHYSICAL EXAM:  VS: BP 128/64 (BP Location: Left Arm, Patient Position: Sitting, Cuff Size: Normal)   Pulse 66   Temp 97.8 F (36.6 C) (Oral)   Ht 5' (1.524 m)   Wt 145 lb (65.8 kg)   SpO2 97%   BMI 28.32 kg/m  Physical Exam Gen: NAD, alert, cooperative with exam,  well-appearing ENT: normal lips, normal nasal mucosa,  Eye: normal EOM, normal conjunctiva and lids CV:  no edema, +2 pedal pulses   Resp: no accessory muscle use, non-labored,  Skin: no rashes, no areas of induration  Neuro: normal tone, normal sensation to touch Psych:  normal insight, alert and oriented MSK:  Right knee   Mild effusion  Normal knee flexion and extension  TTP of the medial and lateral joint line  No instability  Negative McMurray's test  Neurovascularly intact    Aspiration/Injection Procedure Note Katie [name] October 21, 1940  Procedure: Injection Indications: right knee pain   Procedure Details Consent: Risks of procedure as well as the alternatives and risks of each were explained to the (patient/caregiver).  Consent for procedure obtained. Time Out: Verified patient identification, verified procedure, site/side was marked, verified correct patient position, special equipment/implants available, medications/allergies/relevent history reviewed, required imaging and test results available.  Performed.  The area was cleaned with iodine and alcohol swabs.    The right knee superiolateral SPP was injected using 1 cc's of 40 mg/mL Kenalog and 4 cc's of 0.25% benzocaine with a 25 1 1/2" needle.  Ultrasound was used. Images were obtained in Transverse and Long views showing the injection.    A sterile dressing was applied.  Patient did tolerate procedure well.      ASSESSMENT & PLAN:   Acute pain of right knee Pain likely related to OA. She reports a history of bone on bone.  - injection today  - pennsaid samples - counseled on HEP  - if no improvement consider medial brace unloader, gel injections and imaging.

## 2017-06-03 NOTE — Assessment & Plan Note (Signed)
Pain likely related to OA. She reports a history of bone on bone.  - injection today  - pennsaid samples - counseled on HEP  - if no improvement consider medial brace unloader, gel injections and imaging.

## 2017-06-03 NOTE — Patient Instructions (Signed)
Good to see you!  Please try to ice and rub pennsaid your knee  Take tylenol 650 mg three times a day is the best evidence based medicine we have for arthritis.  Glucosamine sulfate 750mg  twice a day is a supplement that has been shown to help moderate to severe arthritis. Vitamin D 2000 IU daily Fish oil 2 grams daily.  Tumeric 500mg  twice daily.  Capsaicin topically up to four times a day may also help with pain.  It's important that you continue to stay active. Controlling your weight is important.  Consider physical therapy to strengthen muscles around the joint that hurts to take pressure off of the joint itself. Please follow up with me in 4-6 weeks if her symptoms don't improve

## 2017-06-04 ENCOUNTER — Ambulatory Visit: Payer: Private Health Insurance - Indemnity | Admitting: Nurse Practitioner

## 2017-06-04 ENCOUNTER — Encounter: Payer: Self-pay | Admitting: Rehabilitation

## 2017-06-04 ENCOUNTER — Ambulatory Visit: Payer: Medicare Other | Admitting: Rehabilitation

## 2017-06-04 DIAGNOSIS — R296 Repeated falls: Secondary | ICD-10-CM

## 2017-06-04 DIAGNOSIS — R2681 Unsteadiness on feet: Secondary | ICD-10-CM

## 2017-06-04 DIAGNOSIS — R42 Dizziness and giddiness: Secondary | ICD-10-CM | POA: Diagnosis not present

## 2017-06-04 DIAGNOSIS — R2689 Other abnormalities of gait and mobility: Secondary | ICD-10-CM | POA: Diagnosis not present

## 2017-06-04 DIAGNOSIS — M6281 Muscle weakness (generalized): Secondary | ICD-10-CM

## 2017-06-04 DIAGNOSIS — R208 Other disturbances of skin sensation: Secondary | ICD-10-CM | POA: Diagnosis not present

## 2017-06-04 NOTE — Therapy (Signed)
[facility] [name] [place], [place], [address] Phone: [phone]   Fax:  [phone]  Physical Therapy Treatment  Patient Details  Name: Katie [name] MRN: [id number] Date of Birth: 09/12/1940 Referring Provider: Libby Maw, MD   Encounter Date: 06/04/2017  PT End of Session - 06/04/17 1412    Visit Number  8    Number of Visits  17    Date for PT Re-Evaluation  08/07/17    Authorization Type  Medicare and [employer]    PT Start Time  1405    PT Stop Time  1445    PT Time Calculation (min)  40 min    Activity Tolerance  Patient tolerated treatment well    Behavior During Therapy  [facility] for tasks assessed/performed       Past Medical History:  Diagnosis Date  . Arthritis   . Cancer (HCC)    Endometrial  . Glaucoma   . Thyroid disease     Past Surgical History:  Procedure Laterality Date  . ABDOMINAL HYSTERECTOMY    . APPENDECTOMY    . BREAST SURGERY    . CHOLECYSTECTOMY    . FEMUR SURGERY  06/2013    There were no vitals filed for this visit.  Subjective Assessment - 06/04/17 1409    Subjective  Got gel injection yesterday, not wearing brace today due to feeling better.     Pertinent History  hypothyroidism, vertigo with questionable labyrinthitis, osteoporosis, endometrial cancer with chemo and radiation, peripheral neuropathy, OA, chronic back pain and fall with L hip fracture requiring fixation    Limitations  Standing;Walking    How long can you stand comfortably?  has to sit on stool in the kitchen    Patient Stated Goals  get a little stronger and decrease back pain    Currently in Pain?  Yes    Pain Score  2     Pain Location  Back    Pain Orientation  Lower    Pain Descriptors / Indicators  Aching    Pain Type  Chronic pain    Pain Onset  More than a month ago    Pain Frequency  Constant    Aggravating Factors   increased standing, walking    Pain Relieving Factors   sitting                        [facility] Adult PT Treatment/Exercise - 06/04/17 1420      Transfers   Transfers  Sit to Stand;Stand to Sit    Sit to Stand  7: Independent    Five time sit to stand comments   11.90 secs without UE support    Stand to Sit  7: Independent      Standardized Balance Assessment   Standardized Balance Assessment  Berg Balance Test;Timed Up and Go Test      Berg Balance Test   Sit to Stand  Able to stand without using hands and stabilize independently    Standing Unsupported  Able to stand safely 2 minutes    Sitting with Back Unsupported but Feet Supported on Floor or Stool  Able to sit safely and securely 2 minutes    Stand to Sit  Sits safely with minimal use of hands    Transfers  Able to transfer safely, minor use of hands    Standing Unsupported with Eyes Closed  Able to stand 10 seconds with supervision  Standing Ubsupported with Feet Together  Able to place feet together independently and stand 1 minute safely    From Standing, Reach Forward with Outstretched Arm  Can reach forward >12 cm safely (5")    From Standing Position, Pick up Object from Zeb to pick up shoe safely and easily    From Standing Position, Turn to Look Behind Over each Shoulder  Needs supervision when turning    Turn 360 Degrees  Able to turn 360 degrees safely but slowly    Standing Unsupported, Alternately Place Feet on Step/Stool  Able to complete >2 steps/needs minimal assist    Standing Unsupported, One Foot in Front  Able to plae foot ahead of the other independently and hold 30 seconds    Standing on One Leg  Tries to lift leg/unable to hold 3 seconds but remains standing independently    Total Score  42      Timed Up and Go Test   TUG  Normal TUG    Normal TUG (seconds)  12.84 w/o cane first trial, 12.13 w/o cane 2nd trial      Self-Care   Self-Care  Other Self-Care Comments    Other Self-Care Comments   Educated on re-assessment results and  importance of compliance with HEP in order to see bigger gains with balance.  Pt verbalized understanding.        Neuro Re-ed    Neuro Re-ed Details   Performed lateral and forward stepping to targets on floor with emphasis on large step for modified SLS and cues for return to midline with single step.  Performed x 5 reps each side with min A due to intermittent LOB>              PT Education - 06/04/17 1411    Education provided  Yes    Person(s) Educated  Patient    Methods  Explanation    Comprehension  Verbalized understanding       PT Short Term Goals - 06/04/17 1412      PT SHORT TERM GOAL #1   Title  Pt will demonstrate independence with initial HEP    Time  4    Period  Weeks    Status  New      PT SHORT TERM GOAL #2   Title  Pt will improve five time sit to stand to </= 17 seconds with use of UE    Baseline  21.25 to 11.90 secs without UE support on 06/04/17    Time  4    Period  Weeks    Status  Achieved      PT SHORT TERM GOAL #3   Title  Pt will improve BERG score to >/= 46/56    Baseline  41/56 significant falls risk to 42/56 on 06/04/17    Time  4    Period  Weeks    Status  Partially Met      PT SHORT TERM GOAL #4   Title  Pt will improve TUG, without use of cane) to </= 12 seconds    Baseline  14.1 seconds without cane, 12.84 without cane 12.18 secs without cane with cues for improved turning on 06/04/17    Time  4    Period  Weeks    Status  Partially Met      PT SHORT TERM GOAL #5   Title  Pt will negotiate curb with cane and supervision    Time  4  Period  Weeks    Status  New        PT Long Term Goals - 05/09/17 1705      PT LONG TERM GOAL #1   Title  Pt will demonstrate independence with balance, strengthening HEP    Time  12    Period  Weeks    Status  New    Target Date  08/07/17      PT LONG TERM GOAL #2   Title  Pt will improve BERG to >/= 50/56    Time  12    Period  Weeks    Status  New    Target Date  08/07/17       PT LONG TERM GOAL #3   Title  Pt will improve five time sit to stand </= 13 seconds with UE support    Time  12    Period  Weeks    Status  New    Target Date  08/07/17      PT LONG TERM GOAL #4   Title  Pt will ambulate x 200' over paved surfaces and up/down community curb with cane MOD I    Time  12    Period  Weeks    Status  New    Target Date  08/07/17      PT LONG TERM GOAL #5   Title  Pt will report being able to stand in kitchen to fix a meal x 10-15 minutes without using stool    Time  12    Period  Weeks    Status  New    Target Date  08/07/17            Plan - 06/04/17 1501    Clinical Impression Statement  Skilled session began to look at [place].  Pt has met 1/5 STGs, partially meeting STG 3 with one point improvement on BERG balance test and STG 4 improving TUG to 12.13 secs without cane.  Pt reports she has not been very compliant with HEP esp balance exercises.  Provided education on importance of exercises and how they will improve mobility and decrease fall risk.  Pt verbalized understanding.     Rehab Potential  Good    PT Frequency  2x / week    PT Duration  12 weeks will be on hold for a few weeks, return to [place]    PT Treatment/Interventions  ADLs/Self Care Home Management;Canalith Repostioning;Moist Heat;Cryotherapy;Electrical Stimulation;DME Instruction;Gait training;Stair training;Functional mobility training;Therapeutic activities;Therapeutic exercise;Balance training;Neuromuscular re-education;Patient/family education;Manual techniques;Passive range of motion;Taping;Vestibular    PT Next Visit Plan  Finish STGs (esp HEP, does she have counter top marching? she may benefit from this) ,multi-sensory balance training on balance beam/compliant surfaces, progress corner balance, strengthening of core/LE's (avoid extension due to stenosis/disc bulging), curb with cane    PT Home Exercise Plan  also needs core/back stabilization exercises, decompression exercises     Consulted and Agree with Plan of Care  Patient       Patient will benefit from skilled therapeutic intervention in order to improve the following deficits and impairments:  Abnormal gait, Decreased balance, Decreased strength, Difficulty walking, Dizziness, Increased edema, Impaired sensation, Pain  Visit Diagnosis: Unsteadiness on feet  Other abnormalities of gait and mobility  Repeated falls  Other disturbances of skin sensation  Muscle weakness (generalized)     Problem List Patient Active Problem List   Diagnosis Date Noted  . Acute pain of right knee 06/03/2017  . Loose  stools 05/30/2017  . Chronic diarrhea 05/30/2017  . 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 Osteoarthritis of right knee 05/27/2017  . Labyrinthitis 04/18/2017  . Need for assistance due to unsteady gait 04/18/2017  . Age-related osteoporosis without current pathological fracture 01/23/2017  . Acquired hypothyroidism 01/23/2017  . History of endometrial cancer 01/23/2017   Cameron Sprang, PT, MPT [facility] [address] [place], [place], [address] Phone: [phone]   Fax:  [phone] 06/04/17, 3:04 PM  Name: Katie [name] MRN: [id number] Date of Birth: 18-Apr-1940

## 2017-06-05 ENCOUNTER — Encounter: Payer: Self-pay | Admitting: Physical Therapy

## 2017-06-05 ENCOUNTER — Ambulatory Visit: Payer: Medicare Other | Attending: Family Medicine | Admitting: Physical Therapy

## 2017-06-05 DIAGNOSIS — R208 Other disturbances of skin sensation: Secondary | ICD-10-CM | POA: Diagnosis not present

## 2017-06-05 DIAGNOSIS — R296 Repeated falls: Secondary | ICD-10-CM | POA: Diagnosis not present

## 2017-06-05 DIAGNOSIS — M6281 Muscle weakness (generalized): Secondary | ICD-10-CM | POA: Diagnosis not present

## 2017-06-05 DIAGNOSIS — R42 Dizziness and giddiness: Secondary | ICD-10-CM | POA: Diagnosis not present

## 2017-06-05 DIAGNOSIS — R2689 Other abnormalities of gait and mobility: Secondary | ICD-10-CM | POA: Diagnosis not present

## 2017-06-05 DIAGNOSIS — R2681 Unsteadiness on feet: Secondary | ICD-10-CM | POA: Insufficient documentation

## 2017-06-05 NOTE — Patient Instructions (Addendum)
At counter top for balance assistance as needed:   While seated in chair put tied red band around both legs, just above knees. Stand up and face counter top with hand support as needed on counter. Step sideways along counter stretching the band. Lift foot each time and control the steps. When at the end, side step the other direction back to starting position. Perform 3-5 laps toward each direction. 1 time a day.   "[employer] love a [employer] worker onto counter for balance: high knee marching forward along counter, then backwards along counter. Hold the knee up in air for 5 seconds each time.  Perform 3-5 laps each way along counter top.  1 time a day  http://gt2.exer.us/345  Copyright  VHI. All rights reserved.    Feet Heel-Toe "Tandem"    Arms holding counter as needed: walk a straight line forward by bringing one foot directly in front of the other, then walk a straight line backwards by bringing one foot directly behind the other one.  Perform 3-5 laps each way. 1 time a day   Perform these in the corner with a chair in front for safety as needed:  Copyright  VHI. All rights reserved.  Feet Together (Compliant Surface) Varied Arm Positions - Eyes Closed    Stand on compliant surface: _pillow/s_ with feet together and arms at sides. Close eyes and visualize upright position. Hold_30_ seconds. Repeat _2-3_ times per session. Do _1_ sessions per day.  Copyright  VHI. All rights reserved.      Feet Apart (Compliant Surface) Head Motion - Eyes Closed    Stand on compliant surface: _pillow/s_ with feet shoulder width apart. Close eyes and move head slowly: 1. Up and down x 10 reps 2. Left and right x 10 reps  Do _1-2_ sessions per day.  Copyright  VHI. All rights reserved.

## 2017-06-06 NOTE — Therapy (Signed)
[facility] [address] [place], [place], [address] Phone: [phone]   Fax:  [phone]  Physical Therapy Treatment  Patient Details  Name: Katie [name] MRN: [id number] Date of Birth: 03-21-1940 Referring Provider: Libby Maw, MD   Encounter Date: 06/05/2017  PT End of Session - 06/05/17 1453    Visit Number  9    Number of Visits  17    Date for PT Re-Evaluation  08/07/17    Authorization Type  Medicare and [employer]    PT Start Time  1450    PT Stop Time  1530    PT Time Calculation (min)  40 min    Activity Tolerance  Patient tolerated treatment well    Behavior During Therapy  [facility] for tasks assessed/performed       Past Medical History:  Diagnosis Date  . Arthritis   . Cancer (HCC)    Endometrial  . Glaucoma   . Thyroid disease     Past Surgical History:  Procedure Laterality Date  . ABDOMINAL HYSTERECTOMY    . APPENDECTOMY    . BREAST SURGERY    . CHOLECYSTECTOMY    . FEMUR SURGERY  06/2013    There were no vitals filed for this visit.  Subjective Assessment - 06/05/17 1451    Subjective  Still not using brace after gel injection, however does report some increase in discomfort. No falls.     Pertinent History  hypothyroidism, vertigo with questionable labyrinthitis, osteoporosis, endometrial cancer with chemo and radiation, peripheral neuropathy, OA, chronic back pain and fall with L hip fracture requiring fixation    Limitations  Standing;Walking    How long can you stand comfortably?  has to sit on stool in the kitchen    Patient Stated Goals  get a little stronger and decrease back pain    Currently in Pain?  Yes    Pain Score  4     Pain Location  Back    Pain Orientation  Lower;Left    Pain Descriptors / Indicators  Aching    Pain Type  Chronic pain    Pain Frequency  Constant    Aggravating Factors   increased standing, walking    Pain Relieving Factors  sitting  down      treatment Issued the following to HEP today with min guard assist for balance. Cues on ex form and technique.  At counter top for balance assistance as needed:   While seated in chair put tied red band around both legs, just above knees. Stand up and face counter top with hand support as needed on counter. Step sideways along counter stretching the band. Lift foot each time and control the steps. When at the end, side step the other direction back to starting position. Perform 3-5 laps toward each direction. 1 time a day.   "[employer] love a [employer] worker onto counter for balance: high knee marching forward along counter, then backwards along counter. Hold the knee up in air for 5 seconds each time.  Perform 3-5 laps each way along counter top.  1 time a day  http://gt2.exer.us/345  Copyright  VHI. All rights reserved.    Feet Heel-Toe "Tandem"    Arms holding counter as needed: walk a straight line forward by bringing one foot directly in front of the other, then walk a straight line backwards by bringing one foot directly behind the other one.  Perform 3-5 laps  each way. 1 time a day   Perform these in the corner with a chair in front for safety as needed:  Copyright  VHI. All rights reserved.  Feet Together (Compliant Surface) Varied Arm Positions - Eyes Closed    Stand on compliant surface: _pillow/s_ with feet together and arms at sides. Close eyes and visualize upright position. Hold_30_ seconds. Repeat _2-3_ times per session. Do _1_ sessions per day.  Copyright  VHI. All rights reserved.      Feet Apart (Compliant Surface) Head Motion - Eyes Closed    Stand on compliant surface: _pillow/s_ with feet shoulder width apart. Close eyes and move head slowly: 1. Up and down x 10 reps 2. Left and right x 10 reps  Do _1-2_ sessions per day.  Copyright  VHI. All rights reserved.       PT Education - 06/06/17 1253    Education  provided  Yes    Education Details  consolidated and advanced HEP    Person(s) Educated  Patient    Methods  Explanation;Demonstration;Verbal cues;Handout    Comprehension  Verbalized understanding;Returned demonstration;Verbal cues required;Need further instruction       PT Short Term Goals - 06/06/17 1257      PT SHORT TERM GOAL #1   Title  Pt will demonstrate independence with initial HEP    Baseline  06/05/17: HEP advanced today     Status  Achieved      PT SHORT TERM GOAL #2   Title  Pt will improve five time sit to stand to </= 17 seconds with use of UE    Baseline  21.25 to 11.90 secs without UE support on 06/04/17    Status  Achieved      PT SHORT TERM GOAL #3   Title  Pt will improve BERG score to >/= 46/56    Baseline  41/56 significant falls risk to 42/56 on 06/04/17    Status  Partially Met      PT SHORT TERM GOAL #4   Title  Pt will improve TUG, without use of cane) to </= 12 seconds    Baseline  14.1 seconds without cane, 12.84 without cane 12.18 secs without cane with cues for improved turning on 06/04/17    Status  Partially Met      PT SHORT TERM GOAL #5   Title  Pt will negotiate curb with cane and supervision    Time  4    Period  Weeks    Status  On-going        PT Long Term Goals - 05/09/17 1705      PT LONG TERM GOAL #1   Title  Pt will demonstrate independence with balance, strengthening HEP    Time  12    Period  Weeks    Status  New    Target Date  08/07/17      PT LONG TERM GOAL #2   Title  Pt will improve BERG to >/= 50/56    Time  12    Period  Weeks    Status  New    Target Date  08/07/17      PT LONG TERM GOAL #3   Title  Pt will improve five time sit to stand </= 13 seconds with UE support    Time  12    Period  Weeks    Status  New    Target Date  08/07/17      PT LONG  TERM GOAL #4   Title  Pt will ambulate x 200' over paved surfaces and up/down community curb with cane MOD I    Time  12    Period  Weeks    Status  New     Target Date  08/07/17      PT LONG TERM GOAL #5   Title  Pt will report being able to stand in kitchen to fix a meal x 10-15 minutes without using stool    Time  12    Period  Weeks    Status  New    Target Date  08/07/17          Plan - 06/05/17 1453    Clinical Impression Statement  Today's skilled session focused on remaining STG regarding pt's HEP. Pt reports intermittent complaince with HEP, she does some a lot and some not at all. Does report not enough time to do them all. Today's session focused on narrowing down to 5 exercies that address strengthening and balance combined. Pt able to perform then today without any issues. Pt is progressing toward goals and should benefit from continued PT to progress toward unmet goals.     Rehab Potential  Good    PT Frequency  2x / week    PT Duration  12 weeks will be on hold for a few weeks, return to [place]    PT Treatment/Interventions  ADLs/Self Care Home Management;Canalith Repostioning;Moist Heat;Cryotherapy;Electrical Stimulation;DME Instruction;Gait training;Stair training;Functional mobility training;Therapeutic activities;Therapeutic exercise;Balance training;Neuromuscular re-education;Patient/family education;Manual techniques;Passive range of motion;Taping;Vestibular    PT Next Visit Plan  multi-sensory balance training on balance beam/compliant surfaces, progress corner balance, strengthening of core/LE's (avoid extension due to stenosis/disc bulging), curb with cane    Consulted and Agree with Plan of Care  Patient       Patient will benefit from skilled therapeutic intervention in order to improve the following deficits and impairments:  Abnormal gait, Decreased balance, Decreased strength, Difficulty walking, Dizziness, Increased edema, Impaired sensation, Pain  Visit Diagnosis: Unsteadiness on feet  Other abnormalities of gait and mobility  Repeated falls  Muscle weakness (generalized)  Dizziness and  giddiness     Problem List Patient Active Problem List   Diagnosis Date Noted  . Acute pain of right knee 06/03/2017  . Loose stools 05/30/2017  . Chronic diarrhea 05/30/2017  . 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 Osteoarthritis of right knee 05/27/2017  . Labyrinthitis 04/18/2017  . Need for assistance due to unsteady gait 04/18/2017  . Age-related osteoporosis without current pathological fracture 01/23/2017  . Acquired hypothyroidism 01/23/2017  . History of endometrial cancer 01/23/2017    Willow Ora, PTA, [place] [address] [place], [place] [address] [phone] 06/06/17, 12:59 PM   Name: Katie [name] MRN: [id number] Date of Birth: Dec 17, 1940

## 2017-06-07 ENCOUNTER — Ambulatory Visit: Payer: Private Health Insurance - Indemnity | Admitting: Physical Therapy

## 2017-06-12 ENCOUNTER — Ambulatory Visit: Payer: Private Health Insurance - Indemnity | Admitting: Physical Therapy

## 2017-06-14 ENCOUNTER — Ambulatory Visit: Payer: Private Health Insurance - Indemnity | Admitting: Physical Therapy

## 2017-06-19 ENCOUNTER — Encounter: Payer: Self-pay | Admitting: Physical Therapy

## 2017-06-19 ENCOUNTER — Ambulatory Visit: Payer: Medicare Other | Admitting: Physical Therapy

## 2017-06-19 ENCOUNTER — Encounter: Payer: Self-pay | Admitting: Podiatry

## 2017-06-19 ENCOUNTER — Ambulatory Visit (INDEPENDENT_AMBULATORY_CARE_PROVIDER_SITE_OTHER): Payer: Medicare Other | Admitting: Podiatry

## 2017-06-19 DIAGNOSIS — R296 Repeated falls: Secondary | ICD-10-CM | POA: Diagnosis not present

## 2017-06-19 DIAGNOSIS — M79675 Pain in left toe(s): Secondary | ICD-10-CM

## 2017-06-19 DIAGNOSIS — M6281 Muscle weakness (generalized): Secondary | ICD-10-CM | POA: Diagnosis not present

## 2017-06-19 DIAGNOSIS — M79674 Pain in right toe(s): Secondary | ICD-10-CM | POA: Diagnosis not present

## 2017-06-19 DIAGNOSIS — B351 Tinea unguium: Secondary | ICD-10-CM

## 2017-06-19 DIAGNOSIS — R2689 Other abnormalities of gait and mobility: Secondary | ICD-10-CM | POA: Diagnosis not present

## 2017-06-19 DIAGNOSIS — R208 Other disturbances of skin sensation: Secondary | ICD-10-CM | POA: Diagnosis not present

## 2017-06-19 DIAGNOSIS — R2681 Unsteadiness on feet: Secondary | ICD-10-CM

## 2017-06-19 DIAGNOSIS — R42 Dizziness and giddiness: Secondary | ICD-10-CM | POA: Diagnosis not present

## 2017-06-19 NOTE — Progress Notes (Signed)
Complaint:  Visit Type: Patient returns to my office for continued preventative foot care services. Complaint: Patient states" my nails have grown long and thick and become painful to walk and wear shoes" Patient has been diagnosed with neuropathy due to chemotherapy. The patient presents for preventative foot care services. No changes to ROS  Podiatric Exam: Vascular: dorsalis pedis and posterior tibial pulses are palpable bilateral. Capillary return is immediate. Temperature gradient is WNL. Skin turgor WNL  Sensorium: Normal Semmes Weinstein monofilament test. Normal tactile sensation bilaterally. Nail Exam: Pt has thick disfigured discolored nails with subungual debris noted bilateral entire nail hallux through fifth toenails Ulcer Exam: There is no evidence of ulcer or pre-ulcerative changes or infection. Orthopedic Exam: Muscle tone and strength are WNL. No limitations in general ROM. No crepitus or effusions noted. Foot type and digits show no abnormalities. HAV 1st MPJ left foot.  Skin: No Porokeratosis. No infection or ulcers  Diagnosis:  Onychomycosis, , Pain in right toe, pain in left toes  Treatment & Plan Procedures and Treatment: Consent by patient was obtained for treatment procedures.   Debridement of mycotic and hypertrophic toenails, 1 through 5 bilateral and clearing of subungual debris. No ulceration, no infection noted.  Return Visit-Office Procedure: Patient instructed to return to the office for a follow up visit 3 months for continued evaluation and treatment.    Gardiner Barefoot DPM

## 2017-06-19 NOTE — Therapy (Signed)
[facility] [address] [place], [place], [address] Phone: [phone]   Fax:  [phone]  Physical Therapy Treatment  Patient Details  Name: Katie [name] MRN: [id number] Date of Birth: 1940-05-24 Referring Provider: Libby Maw, MD   Encounter Date: 06/19/2017  PT End of Session - 06/19/17 2004    Visit Number  10    Number of Visits  17    Date for PT Re-Evaluation  08/07/17    Authorization Type  Medicare and [employer]    PT Start Time  1448    PT Stop Time  1532    PT Time Calculation (min)  44 min    Activity Tolerance  Patient tolerated treatment well    Behavior During Therapy  [facility] for tasks assessed/performed       Past Medical History:  Diagnosis Date  . Arthritis   . Cancer (HCC)    Endometrial  . Glaucoma   . Thyroid disease     Past Surgical History:  Procedure Laterality Date  . ABDOMINAL HYSTERECTOMY    . APPENDECTOMY    . BREAST SURGERY    . CHOLECYSTECTOMY    . FEMUR SURGERY  06/2013    There were no vitals filed for this visit.  Subjective Assessment - 06/19/17 1451    Subjective  Flew to [place] to bring her car back; drove for 2 days back.  Fatigued today and has some neck stiff.  Pt is back to wearing the brace on the right knee.      Pertinent History  hypothyroidism, vertigo with questionable labyrinthitis, osteoporosis, endometrial cancer with chemo and radiation, peripheral neuropathy, OA, chronic back pain and fall with L hip fracture requiring fixation    Limitations  Standing;Walking    How long can you stand comfortably?  has to sit on stool in the kitchen    Patient Stated Goals  get a little stronger and decrease back pain    Currently in Pain?  Yes    Pain Score  5     Pain Location  Back    Pain Orientation  Left;Lower    Pain Descriptors / Indicators  Aching    Pain Type  Chronic pain                       [facility] Adult PT  Treatment/Exercise - 06/19/17 1508      Ambulation/Gait   Curb  5: Supervision    Curb Details (indicate cue type and reason)  verbal cues for sequence when ascending leading with LLE and cane, descending with RLE and cane x 3 reps      FOR THE MARCHING EXERCISE:  - At the end of your counter top hold one leg up for 10 seconds.  At the other end of the counter hold the other leg up for 10 seconds   Trunk Rotation    Stand with both hands touching the counter.  Twist trunk and hips to one side reaching back with one hand. Slowly return to starting position. Twist to other side reaching with other hand. Repeat __5__ times, each side.        PT Education - 06/19/17 2003    Education provided  Yes    Education Details  areas to continue to focus on in therapy    Person(s) Educated  Patient    Methods  Explanation    Comprehension  Verbalized understanding  PT Short Term Goals - 06/19/17 2017      PT SHORT TERM GOAL #1   Title  Pt will demonstrate independence with initial HEP    Baseline  06/05/17: HEP advanced today     Status  Achieved      PT SHORT TERM GOAL #2   Title  Pt will improve five time sit to stand to </= 17 seconds with use of UE    Baseline  21.25 to 11.90 secs without UE support on 06/04/17    Status  Achieved      PT SHORT TERM GOAL #3   Title  Pt will improve BERG score to >/= 46/56    Baseline  41/56 significant falls risk to 42/56 on 06/04/17    Status  Partially Met      PT SHORT TERM GOAL #4   Title  Pt will improve TUG, without use of cane) to </= 12 seconds    Baseline  12.18 secs without cane with cues for improved turning on 06/04/17    Status  Partially Met      PT SHORT TERM GOAL #5   Title  Pt will negotiate curb with cane and supervision    Time  4    Period  Weeks    Status  Achieved        PT Long Term Goals - 06/19/17 1458      PT LONG TERM GOAL #1   Title  Pt will demonstrate independence with balance, strengthening HEP     Time  12    Period  Weeks    Status  New    Target Date  08/07/17      PT LONG TERM GOAL #2   Title  Pt will improve BERG to >/= 50/56    Time  12    Period  Weeks    Status  New    Target Date  08/07/17      PT LONG TERM GOAL #3   Title  Pt will improve five time sit to stand </= 13 seconds with UE support    Baseline  11    Time  12    Period  Weeks    Status  Achieved      PT LONG TERM GOAL #4   Title  Pt will ambulate x 200' over paved surfaces and up/down community curb with cane MOD I    Time  12    Period  Weeks    Status  New    Target Date  08/07/17      PT LONG TERM GOAL #5   Title  Pt will report being able to stand in kitchen to fix a meal x 10-15 minutes without using stool    Time  12    Period  Weeks    Status  New    Target Date  08/07/17            Plan - 06/19/17 2004    Clinical Impression Statement  Treatment session today focused on assessment of final STG - curb negotiation with cane.  Pt initially ascended with RLE and required min A for balance due to R knee pain and weakness; provided verbal cues for safer stepping sequence ascending with LLE, descending with RLE.  Pt able to progress to safe supervision to negotiate.  Reviewed most recent balance assessments from STG and discussed areas of continued impairment.  Added to current HEP: SLS with high knee marching  and upper trunk rotation while reaching behind.  Pt tolerated well despite increased fatigue today.  Will continue to address to progress towards LTG.    Rehab Potential  Good    PT Frequency  2x / week    PT Duration  12 weeks will be on hold for a few weeks, return to [place]    PT Treatment/Interventions  ADLs/Self Care Home Management;Canalith Repostioning;Moist Heat;Cryotherapy;Electrical Stimulation;DME Instruction;Gait training;Stair training;Functional mobility training;Therapeutic activities;Therapeutic exercise;Balance training;Neuromuscular re-education;Patient/family  education;Manual techniques;Passive range of motion;Taping;Vestibular    PT Next Visit Plan  SLS, pivoting/changing direction, multi-sensory balance training on balance beam/compliant surfaces, progress corner balance, strengthening of core/LE's (avoid extension due to stenosis/disc bulging)    Consulted and Agree with Plan of Care  Patient       Patient will benefit from skilled therapeutic intervention in order to improve the following deficits and impairments:  Abnormal gait, Decreased balance, Decreased strength, Difficulty walking, Dizziness, Increased edema, Impaired sensation, Pain  Visit Diagnosis: Unsteadiness on feet  Other abnormalities of gait and mobility  Repeated falls  Muscle weakness (generalized)  Other disturbances of skin sensation     Problem List Patient Active Problem List   Diagnosis Date Noted  . Acute pain of right knee 06/03/2017  . Loose stools 05/30/2017  . Chronic diarrhea 05/30/2017  . 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 Osteoarthritis of right knee 05/27/2017  . Labyrinthitis 04/18/2017  . Need for assistance due to unsteady gait 04/18/2017  . Age-related osteoporosis without current pathological fracture 01/23/2017  . Acquired hypothyroidism 01/23/2017  . History of endometrial cancer 01/23/2017    Rico Junker, PT, DPT 06/19/17    8:20 PM    [facility] [address] [place], [place], [address] Phone: [phone]   Fax:  [phone]  Name: Katie [name] MRN: [id number] Date of Birth: 1940-11-21

## 2017-06-19 NOTE — Patient Instructions (Signed)
FOR THE MARCHING EXERCISE:  - At the end of your counter top hold one leg up for 10 seconds.  At the other end of the counter hold the other leg up for 10 seconds   Trunk Rotation    Stand with both hands touching the counter.  Twist trunk and hips to one side reaching back with one hand. Slowly return to starting position. Twist to other side reaching with other hand. Repeat __5__ times, each side.

## 2017-06-21 ENCOUNTER — Encounter: Payer: Self-pay | Admitting: Physical Therapy

## 2017-06-21 ENCOUNTER — Ambulatory Visit: Payer: Medicare Other | Admitting: Physical Therapy

## 2017-06-21 DIAGNOSIS — R2689 Other abnormalities of gait and mobility: Secondary | ICD-10-CM

## 2017-06-21 DIAGNOSIS — M6281 Muscle weakness (generalized): Secondary | ICD-10-CM

## 2017-06-21 DIAGNOSIS — R296 Repeated falls: Secondary | ICD-10-CM

## 2017-06-21 DIAGNOSIS — R208 Other disturbances of skin sensation: Secondary | ICD-10-CM

## 2017-06-21 DIAGNOSIS — R2681 Unsteadiness on feet: Secondary | ICD-10-CM | POA: Diagnosis not present

## 2017-06-21 DIAGNOSIS — R42 Dizziness and giddiness: Secondary | ICD-10-CM | POA: Diagnosis not present

## 2017-06-23 NOTE — Therapy (Signed)
[facility] [address] [place], [place], [address] Phone: [phone]   Fax:  [phone]  Physical Therapy Treatment  Patient Details  Name: Katie [name] MRN: [id number] Date of Birth: 1941-01-18 Referring Provider: Libby Maw, MD   Encounter Date: 06/21/2017   06/21/17 1452  PT Visits / Re-Eval  Visit Number 11  Number of Visits 17  Date for PT Re-Evaluation 08/07/17  Authorization  Authorization Type Medicare and [place]  PT Time Calculation  PT Start Time 1448  PT Stop Time 1530  PT Time Calculation (min) 42 min  PT - End of Session  Equipment Utilized During Treatment Gait belt  Activity Tolerance Patient tolerated treatment well  Behavior During Therapy [facility] for tasks assessed/performed     Past Medical History:  Diagnosis Date  . Arthritis   . Cancer (HCC)    Endometrial  . Glaucoma   . Thyroid disease     Past Surgical History:  Procedure Laterality Date  . ABDOMINAL HYSTERECTOMY    . APPENDECTOMY    . BREAST SURGERY    . CHOLECYSTECTOMY    . FEMUR SURGERY  06/2013    There were no vitals filed for this visit.     06/21/17 1450  Symptoms/Limitations  Subjective Knee is better today, does not have her brace on. Does report some back and neck pain today.   Pertinent History hypothyroidism, vertigo with questionable labyrinthitis, osteoporosis, endometrial cancer with chemo and radiation, peripheral neuropathy, OA, chronic back pain and fall with L hip fracture requiring fixation  Limitations Standing;Walking  How long can you stand comfortably? has to sit on stool in the kitchen  Patient Stated Goals get a little stronger and decrease back pain  Pain Assessment  Currently in Pain? Yes  Pain Score 5  Pain Location Generalized (neck and back)  Pain Orientation Lower;Left  Pain Descriptors / Indicators Aching  Pain Type Chronic pain  Pain Onset More than a month ago   Pain Frequency Constant  Aggravating Factors  increased standing, walking  Pain Relieving Factors sitting down      06/21/17 1453  High Level Balance  High Level Balance Activities Side stepping;Tandem walking;Marching backwards;Marching forwards (tandem fwd/bwd)  High Level Balance Comments red mats next to counter: intermittent touch to counter for balance, 3 laps each with min guard to min assist for balance.        06/21/17 1509  Balance Exercises: Standing  Rockerboard Anterior/posterior;Lateral;Head turns;EO;EC;30 seconds;10 reps  Balance Exercises: Standing  Rebounder Limitations performed both ways on balance board with no UE support, occasional touch to bars for balance: min guard assist to min assist for rocking board with emphasis on tall posture with EO, progressing to EC; then holding board steady for EC no head movements, progressing to EC head movements left<>right, up<>down. min to mod assist for balance with cues on posture/weight shifting to assist with balance correction.                            PT Short Term Goals - 06/19/17 2017      PT SHORT TERM GOAL #1   Title  Pt will demonstrate independence with initial HEP    Baseline  06/05/17: HEP advanced today     Status  Achieved      PT SHORT TERM GOAL #2   Title  Pt will improve five time sit to stand to </= 17 seconds with  use of UE    Baseline  21.25 to 11.90 secs without UE support on 06/04/17    Status  Achieved      PT SHORT TERM GOAL #3   Title  Pt will improve BERG score to >/= 46/56    Baseline  41/56 significant falls risk to 42/56 on 06/04/17    Status  Partially Met      PT SHORT TERM GOAL #4   Title  Pt will improve TUG, without use of cane) to </= 12 seconds    Baseline  12.18 secs without cane with cues for improved turning on 06/04/17    Status  Partially Met      PT SHORT TERM GOAL #5   Title  Pt will negotiate curb with cane and supervision    Time  4    Period  Weeks    Status   Achieved        PT Long Term Goals - 06/19/17 1458      PT LONG TERM GOAL #1   Title  Pt will demonstrate independence with balance, strengthening HEP    Time  12    Period  Weeks    Status  New    Target Date  08/07/17      PT LONG TERM GOAL #2   Title  Pt will improve BERG to >/= 50/56    Time  12    Period  Weeks    Status  New    Target Date  08/07/17      PT LONG TERM GOAL #3   Title  Pt will improve five time sit to stand </= 13 seconds with UE support    Baseline  11    Time  12    Period  Weeks    Status  Achieved      PT LONG TERM GOAL #4   Title  Pt will ambulate x 200' over paved surfaces and up/down community curb with cane MOD I    Time  12    Period  Weeks    Status  New    Target Date  08/07/17      PT LONG TERM GOAL #5   Title  Pt will report being able to stand in kitchen to fix a meal x 10-15 minutes without using stool    Time  12    Period  Weeks    Status  New    Target Date  08/07/17         06/21/17 1453  Plan  Clinical Impression Statement Today's skilled session focused on balance reactions with an emphasis on complaint surfaces and/or vision removed with no issued reported by patient. Pt continues to progress toward goals with less assistance needed as activities progressed. Pt should benefit from continued PT to progress toward unmet goals.   Pt will benefit from skilled therapeutic intervention in order to improve on the following deficits Abnormal gait;Decreased balance;Decreased strength;Difficulty walking;Dizziness;Increased edema;Impaired sensation;Pain  Rehab Potential Good  PT Frequency 2x / week  PT Duration 12 weeks (will be on hold for a few weeks, return to [place])  PT Treatment/Interventions ADLs/Self Care Home Management;Canalith Repostioning;Moist Heat;Cryotherapy;Electrical Stimulation;DME Instruction;Gait training;Stair training;Functional mobility training;Therapeutic activities;Therapeutic exercise;Balance  training;Neuromuscular re-education;Patient/family education;Manual techniques;Passive range of motion;Taping;Vestibular  PT Next Visit Plan SLS, pivoting/changing direction, multi-sensory balance training on balance beam/compliant surfaces, progress corner balance, strengthening of core/LE's (avoid extension due to stenosis/disc bulging)  Consulted and Agree with Plan of Care Patient  Patient will benefit from skilled therapeutic intervention in order to improve the following deficits and impairments:  Abnormal gait, Decreased balance, Decreased strength, Difficulty walking, Dizziness, Increased edema, Impaired sensation, Pain  Visit Diagnosis: Unsteadiness on feet  Other abnormalities of gait and mobility  Repeated falls  Muscle weakness (generalized)  Dizziness and giddiness  Other disturbances of skin sensation     Problem List Patient Active Problem List   Diagnosis Date Noted  . Acute pain of right knee 06/03/2017  . Loose stools 05/30/2017  . Chronic diarrhea 05/30/2017  . 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 Osteoarthritis of right knee 05/27/2017  . Labyrinthitis 04/18/2017  . Need for assistance due to unsteady gait 04/18/2017  . Age-related osteoporosis without current pathological fracture 01/23/2017  . Acquired hypothyroidism 01/23/2017  . History of endometrial cancer 01/23/2017    Willow Ora, PTA, [facility] [address], [place], [place] [address] [phone] 06/23/17, 3:07 PM   Name: Katie [name] MRN: [id number] Date of Birth: May 17, 1940

## 2017-06-24 ENCOUNTER — Encounter: Payer: Self-pay | Admitting: Physical Therapy

## 2017-06-24 ENCOUNTER — Ambulatory Visit: Payer: Medicare Other | Admitting: Physical Therapy

## 2017-06-24 DIAGNOSIS — R2681 Unsteadiness on feet: Secondary | ICD-10-CM | POA: Diagnosis not present

## 2017-06-24 DIAGNOSIS — R296 Repeated falls: Secondary | ICD-10-CM | POA: Diagnosis not present

## 2017-06-24 DIAGNOSIS — R42 Dizziness and giddiness: Secondary | ICD-10-CM | POA: Diagnosis not present

## 2017-06-24 DIAGNOSIS — M6281 Muscle weakness (generalized): Secondary | ICD-10-CM

## 2017-06-24 DIAGNOSIS — R2689 Other abnormalities of gait and mobility: Secondary | ICD-10-CM | POA: Diagnosis not present

## 2017-06-24 DIAGNOSIS — R208 Other disturbances of skin sensation: Secondary | ICD-10-CM | POA: Diagnosis not present

## 2017-06-24 NOTE — Therapy (Signed)
[facility] [address] [place], [place], [address] Phone: [phone]   Fax:  [phone]  Physical Therapy Treatment  Patient Details  Name: Katie [name] MRN: [id number] Date of Birth: 1941-01-01 Referring Provider: Libby Maw, MD   Encounter Date: 06/24/2017  PT End of Session - 06/24/17 1152    Visit Number  12    Number of Visits  17    Date for PT Re-Evaluation  08/07/17    Authorization Type  Medicare and [employer]    PT Start Time  1150    PT Stop Time  1230    PT Time Calculation (min)  40 min    Equipment Utilized During Treatment  Gait belt    Activity Tolerance  Patient tolerated treatment well    Behavior During Therapy  WFL for tasks assessed/performed       Past Medical History:  Diagnosis Date  . Arthritis   . Cancer (HCC)    Endometrial  . Glaucoma   . Thyroid disease     Past Surgical History:  Procedure Laterality Date  . ABDOMINAL HYSTERECTOMY    . APPENDECTOMY    . BREAST SURGERY    . CHOLECYSTECTOMY    . FEMUR SURGERY  06/2013    There were no vitals filed for this visit.  Subjective Assessment - 06/24/17 1150    Subjective  No new complaints. No falls. Some back pain today. No knee pain, no brace on a this time (in the car "just in case").    Pertinent History  hypothyroidism, vertigo with questionable labyrinthitis, osteoporosis, endometrial cancer with chemo and radiation, peripheral neuropathy, OA, chronic back pain and fall with L hip fracture requiring fixation    Limitations  Standing;Walking    How long can you stand comfortably?  has to sit on stool in the kitchen    Patient Stated Goals  get a little stronger and decrease back pain    Currently in Pain?  Yes    Pain Score  4     Pain Location  Back    Pain Orientation  Lower    Pain Descriptors / Indicators  Aching    Pain Type  Chronic pain    Pain Onset  More than a month ago    Pain Frequency   Constant    Aggravating Factors   increased standing, walking    Pain Relieving Factors  sitting down            [facility] Adult PT Treatment/Exercise - 06/24/17 1153      Ambulation/Gait   Ambulation/Gait  Yes    Ambulation/Gait Assistance  4: Min guard    Ambulation/Gait Assistance Details  dynamic gait activities on track: head turns in random direcitons, direction changes at random times and speed changes with min gauard assist    Ambulation Distance (Feet)  -- around track with activity, around gym with activity    Assistive device  Straight cane    Gait Pattern  Step-through pattern;Lateral hip instability;Antalgic    Ambulation Surface  Level;Indoor      High Level Balance   High Level Balance Activities  Side stepping;Tandem walking;Marching backwards;Marching forwards    High Level Balance Comments  red mats next to counter: intermittent touch to counter for balance, 3 laps each with min guard to min assist for balance.           Balance Exercises - 06/24/17 1211      Balance  Exercises: Standing   Standing Eyes Closed  Narrow base of support (BOS);Head turns;Foam/compliant surface;Other reps (comment);30 secs;Limitations    SLS with Vectors  Foam/compliant surface;Other reps (comment);Limitations      Balance Exercises: Standing   Standing Eyes Closed Limitations  on airex with chair in front for safety, feet as close as knees allowed: EC no head movements, progressing to EC head movements left<>right, up<>down and diagonal both ways (limited range due to cervical spine issues), cues on posture and weight shifting to assist balance. min guard to min assist for balance with no UE support.               SLS with Vectors Limitations  on airex with 2 tall cones in front: alternating fwd toe taps to cones x 10 reps, then cross toe taps to cones x 10 reps. min to mod assist for balance with cues on stance position, weight shifting and to slow down for improved control/balance.                       PT Short Term Goals - 06/19/17 2017      PT SHORT TERM GOAL #1   Title  Pt will demonstrate independence with initial HEP    Baseline  06/05/17: HEP advanced today     Status  Achieved      PT SHORT TERM GOAL #2   Title  Pt will improve five time sit to stand to </= 17 seconds with use of UE    Baseline  21.25 to 11.90 secs without UE support on 06/04/17    Status  Achieved      PT SHORT TERM GOAL #3   Title  Pt will improve BERG score to >/= 46/56    Baseline  41/56 significant falls risk to 42/56 on 06/04/17    Status  Partially Met      PT SHORT TERM GOAL #4   Title  Pt will improve TUG, without use of cane) to </= 12 seconds    Baseline  12.18 secs without cane with cues for improved turning on 06/04/17    Status  Partially Met      PT SHORT TERM GOAL #5   Title  Pt will negotiate curb with cane and supervision    Time  4    Period  Weeks    Status  Achieved        PT Long Term Goals - 06/19/17 1458      PT LONG TERM GOAL #1   Title  Pt will demonstrate independence with balance, strengthening HEP    Time  12    Period  Weeks    Status  New    Target Date  08/07/17      PT LONG TERM GOAL #2   Title  Pt will improve BERG to >/= 50/56    Time  12    Period  Weeks    Status  New    Target Date  08/07/17      PT LONG TERM GOAL #3   Title  Pt will improve five time sit to stand </= 13 seconds with UE support    Baseline  11    Time  12    Period  Weeks    Status  Achieved      PT LONG TERM GOAL #4   Title  Pt will ambulate x 200' over paved surfaces and up/down community curb with cane MOD  I    Time  12    Period  Weeks    Status  New    Target Date  08/07/17      PT LONG TERM GOAL #5   Title  Pt will report being able to stand in kitchen to fix a meal x 10-15 minutes without using stool    Time  12    Period  Weeks    Status  New    Target Date  08/07/17            Plan - 06/24/17 1152    Clinical Impression Statement   Today's skilled session continued to focus on dynamic gait and balance reactions. No issues reported with session today. Pt continues to be challenged when having to rely on her vestibular system for balance. Pt is progressing toward goals and should benefit from continued PT to progress toward unmet goals.     Rehab Potential  Good    PT Frequency  2x / week    PT Duration  12 weeks will be on hold for a few weeks, return to [place]    PT Treatment/Interventions  ADLs/Self Care Home Management;Canalith Repostioning;Moist Heat;Cryotherapy;Electrical Stimulation;DME Instruction;Gait training;Stair training;Functional mobility training;Therapeutic activities;Therapeutic exercise;Balance training;Neuromuscular re-education;Patient/family education;Manual techniques;Passive range of motion;Taping;Vestibular    PT Next Visit Plan  SLS, pivoting/changing direction, multi-sensory balance training on balance beam/compliant surfaces, progress corner balance, strengthening of core/LE's (avoid extension due to stenosis/disc bulging)    Consulted and Agree with Plan of Care  Patient       Patient will benefit from skilled therapeutic intervention in order to improve the following deficits and impairments:  Abnormal gait, Decreased balance, Decreased strength, Difficulty walking, Dizziness, Increased edema, Impaired sensation, Pain  Visit Diagnosis: Unsteadiness on feet  Other abnormalities of gait and mobility  Repeated falls  Muscle weakness (generalized)  Dizziness and giddiness     Problem List Patient Active Problem List   Diagnosis Date Noted  . Acute pain of right knee 06/03/2017  . Loose stools 05/30/2017  . Chronic diarrhea 05/30/2017  . 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 Osteoarthritis of right knee 05/27/2017  . Labyrinthitis 04/18/2017  . Need for assistance due to unsteady gait 04/18/2017  . Age-related osteoporosis without current pathological fracture 01/23/2017  . Acquired  hypothyroidism 01/23/2017  . History of endometrial cancer 01/23/2017    Willow Ora, PTA, [facility] [address] [place], [place] [address] [phone] 06/25/17, 7:29 PM   Name: Quentina Fronek MRN: [id number] Date of Birth: 1940/10/15

## 2017-06-28 ENCOUNTER — Ambulatory Visit: Payer: Medicare Other

## 2017-06-28 DIAGNOSIS — R296 Repeated falls: Secondary | ICD-10-CM | POA: Diagnosis not present

## 2017-06-28 DIAGNOSIS — R42 Dizziness and giddiness: Secondary | ICD-10-CM | POA: Diagnosis not present

## 2017-06-28 DIAGNOSIS — R2681 Unsteadiness on feet: Secondary | ICD-10-CM

## 2017-06-28 DIAGNOSIS — R208 Other disturbances of skin sensation: Secondary | ICD-10-CM | POA: Diagnosis not present

## 2017-06-28 DIAGNOSIS — R2689 Other abnormalities of gait and mobility: Secondary | ICD-10-CM

## 2017-06-28 DIAGNOSIS — M6281 Muscle weakness (generalized): Secondary | ICD-10-CM | POA: Diagnosis not present

## 2017-06-28 NOTE — Therapy (Signed)
[facility] [address] [place], [place], [address] Phone: [phone]   Fax:  [phone]  Physical Therapy Treatment  Patient Details  Name: Katie [name] MRN: [id number] Date of Birth: 12/24/1940 Referring Provider: Libby Maw, MD   Encounter Date: 06/28/2017  PT End of Session - 06/28/17 1544    Visit Number  13    Number of Visits  17    Date for PT Re-Evaluation  08/07/17    Authorization Type  Medicare and [employer]    PT Start Time  1454    PT Stop Time  1534    PT Time Calculation (min)  40 min    Equipment Utilized During Treatment  Gait belt    Activity Tolerance  Patient tolerated treatment well    Behavior During Therapy  WFL for tasks assessed/performed       Past Medical History:  Diagnosis Date  . Arthritis   . Cancer (HCC)    Endometrial  . Glaucoma   . Thyroid disease     Past Surgical History:  Procedure Laterality Date  . ABDOMINAL HYSTERECTOMY    . APPENDECTOMY    . BREAST SURGERY    . CHOLECYSTECTOMY    . FEMUR SURGERY  06/2013    There were no vitals filed for this visit.  Subjective Assessment - 06/28/17 1456    Subjective  Pt denied falls or changes since last visit. Pt did not sleep well last night, as she was upset she missed an appt. yesterday.     Pertinent History  hypothyroidism, vertigo with questionable labyrinthitis, osteoporosis, endometrial cancer with chemo and radiation, peripheral neuropathy, OA, chronic back pain and fall with L hip fracture requiring fixation    Patient Stated Goals  get a little stronger and decrease back pain    Currently in Pain?  Yes    Pain Score  5     Pain Location  -- general aches all over    Pain Descriptors / Indicators  Aching    Pain Type  Chronic pain    Pain Onset  More than a month ago    Pain Frequency  Constant    Aggravating Factors   not sure    Pain Relieving Factors  rest                        [facility] Adult PT Treatment/Exercise - 06/28/17 1539      Ambulation/Gait   Ambulation/Gait  Yes    Ambulation/Gait Assistance  4: Min guard;5: Supervision    Ambulation/Gait Assistance Details  Min guard during 2 LOB episodes, which pt was able to self correct with stepping strategy.     Ambulation Distance (Feet)  75 Feet x3    Assistive device  Straight cane    Gait Pattern  Step-through pattern;Lateral hip instability;Antalgic    Ambulation Surface  Level;Indoor      High Level Balance   High Level Balance Activities  Side stepping;Tandem walking tandem amb. backwards    High Level Balance Comments  In // bars with 0-2 UE support, performed 4-6x/activity. Cues and demo for improved weight shifting.           Balance Exercises - 06/28/17 1542      Balance Exercises: Standing   Rockerboard  Head turns;EO;EC;10 reps;Intermittent UE support in // bars, min guard to min A for safety.    Other Standing Exercises  In // bars on  blue foam: SLS cone taps 4x5 cones/LE along blue foam beam with min guard to min A with cues to improve lateral weigth shifting and for technique. pt also performed cone taps with B LEs on rockerboard x20 reps with intermittent UE support. PT provided external perbuations with pt standing on blue foam beam to improve stepping strategy in ant/lat/post directions x30 reps.           PT Short Term Goals - 06/19/17 2017      PT SHORT TERM GOAL #1   Title  Pt will demonstrate independence with initial HEP    Baseline  06/05/17: HEP advanced today     Status  Achieved      PT SHORT TERM GOAL #2   Title  Pt will improve five time sit to stand to </= 17 seconds with use of UE    Baseline  21.25 to 11.90 secs without UE support on 06/04/17    Status  Achieved      PT SHORT TERM GOAL #3   Title  Pt will improve BERG score to >/= 46/56    Baseline  41/56 significant falls risk to 42/56 on 06/04/17    Status  Partially Met      PT  SHORT TERM GOAL #4   Title  Pt will improve TUG, without use of cane) to </= 12 seconds    Baseline  12.18 secs without cane with cues for improved turning on 06/04/17    Status  Partially Met      PT SHORT TERM GOAL #5   Title  Pt will negotiate curb with cane and supervision    Time  4    Period  Weeks    Status  Achieved        PT Long Term Goals - 06/19/17 1458      PT LONG TERM GOAL #1   Title  Pt will demonstrate independence with balance, strengthening HEP    Time  12    Period  Weeks    Status  New    Target Date  08/07/17      PT LONG TERM GOAL #2   Title  Pt will improve BERG to >/= 50/56    Time  12    Period  Weeks    Status  New    Target Date  08/07/17      PT LONG TERM GOAL #3   Title  Pt will improve five time sit to stand </= 13 seconds with UE support    Baseline  11    Time  12    Period  Weeks    Status  Achieved      PT LONG TERM GOAL #4   Title  Pt will ambulate x 200' over paved surfaces and up/down community curb with cane MOD I    Time  12    Period  Weeks    Status  New    Target Date  08/07/17      PT LONG TERM GOAL #5   Title  Pt will report being able to stand in kitchen to fix a meal x 10-15 minutes without using stool    Time  12    Period  Weeks    Status  New    Target Date  08/07/17            Plan - 06/28/17 1545    Clinical Impression Statement  Today's skilled session focused on high level  balance activities to improve vestibular and visual input, and stepping strategies. Pt continues to experience incr. postural sway and LOB during activities which require incr. vestibular input. Continue with POC.     Rehab Potential  Good    PT Frequency  2x / week    PT Duration  12 weeks will be on hold for a few weeks, return to [place]    PT Treatment/Interventions  ADLs/Self Care Home Management;Canalith Repostioning;Moist Heat;Cryotherapy;Electrical Stimulation;DME Instruction;Gait training;Stair training;Functional mobility  training;Therapeutic activities;Therapeutic exercise;Balance training;Neuromuscular re-education;Patient/family education;Manual techniques;Passive range of motion;Taping;Vestibular    PT Next Visit Plan  Continue SLS, pivoting/changing direction, multi-sensory balance training on balance beam/compliant surfaces, progress corner balance, strengthening of core/LE's (avoid extension due to stenosis/disc bulging)    Consulted and Agree with Plan of Care  Patient       Patient will benefit from skilled therapeutic intervention in order to improve the following deficits and impairments:  Abnormal gait, Decreased balance, Decreased strength, Difficulty walking, Dizziness, Increased edema, Impaired sensation, Pain  Visit Diagnosis: Unsteadiness on feet  Other abnormalities of gait and mobility  Repeated falls     Problem List Patient Active Problem List   Diagnosis Date Noted  . Acute pain of right knee 06/03/2017  . Loose stools 05/30/2017  . Chronic diarrhea 05/30/2017  . 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 Osteoarthritis of right knee 05/27/2017  . Labyrinthitis 04/18/2017  . Need for assistance due to unsteady gait 04/18/2017  . Age-related osteoporosis without current pathological fracture 01/23/2017  . Acquired hypothyroidism 01/23/2017  . History of endometrial cancer 01/23/2017    Sienna Stonehocker L 06/28/2017, 3:47 PM  [facility] [address] [place], [place], [address] Phone: [phone]   Fax:  [phone]  Name: Bobbijo Holst MRN: [id number] Date of Birth: 1940-11-22  Geoffry Paradise, PT,DPT 06/28/17 3:47 PM Phone: [phone] Fax: [phone]

## 2017-07-02 ENCOUNTER — Encounter: Payer: Self-pay | Admitting: Physical Therapy

## 2017-07-02 ENCOUNTER — Ambulatory Visit: Payer: Medicare Other | Admitting: Physical Therapy

## 2017-07-02 DIAGNOSIS — M6281 Muscle weakness (generalized): Secondary | ICD-10-CM | POA: Diagnosis not present

## 2017-07-02 DIAGNOSIS — R2681 Unsteadiness on feet: Secondary | ICD-10-CM

## 2017-07-02 DIAGNOSIS — R208 Other disturbances of skin sensation: Secondary | ICD-10-CM

## 2017-07-02 DIAGNOSIS — R2689 Other abnormalities of gait and mobility: Secondary | ICD-10-CM

## 2017-07-02 DIAGNOSIS — R42 Dizziness and giddiness: Secondary | ICD-10-CM | POA: Diagnosis not present

## 2017-07-02 DIAGNOSIS — R296 Repeated falls: Secondary | ICD-10-CM | POA: Diagnosis not present

## 2017-07-02 NOTE — Therapy (Signed)
[facility] [address] [place], [place], [address] Phone: [phone]   Fax:  [phone]  Physical Therapy Treatment  Patient Details  Name: Katie [name] MRN: [id number] Date of Birth: 12-Sep-1940 Referring Provider: Libby Maw, MD   Encounter Date: 07/02/2017  PT End of Session - 07/02/17 2236    Visit Number  14    Number of Visits  17    Date for PT Re-Evaluation  08/07/17    Authorization Type  Medicare and [place]    PT Start Time  1538    PT Stop Time  1625    PT Time Calculation (min)  47 min    Activity Tolerance  Patient tolerated treatment well    Behavior During Therapy  [facility] for tasks assessed/performed       Past Medical History:  Diagnosis Date  . Arthritis   . Cancer (HCC)    Endometrial  . Glaucoma   . Thyroid disease     Past Surgical History:  Procedure Laterality Date  . ABDOMINAL HYSTERECTOMY    . APPENDECTOMY    . BREAST SURGERY    . CHOLECYSTECTOMY    . FEMUR SURGERY  06/2013    There were no vitals filed for this visit.  Subjective Assessment - 07/02/17 1541    Subjective  Developed severe low back pain (left side). Started 2 days ago. Getting out of the car. ?s if it is due to newer medicine that helps control her diarrhea (notes this is the only recent change she has made and recalls no movement or injury to this area)    Pertinent History  hypothyroidism, vertigo with questionable labyrinthitis, osteoporosis, endometrial cancer with chemo and radiation, peripheral neuropathy, OA, chronic back pain and fall with L hip fracture requiring fixation    Patient Stated Goals  get a little stronger and decrease back pain    Currently in Pain?  Yes    Pain Score  3     Pain Location  Back    Pain Orientation  Lower;Left    Pain Descriptors / Indicators  Aching    Pain Type  -- acute on chronic    Pain Onset  In the past 7 days    Pain Frequency  Intermittent     Aggravating Factors   not sure, certain movements but has not been able to pinpoint                       [facility] Adult PT Treatment/Exercise - 07/02/17 2230      Ambulation/Gait   Ambulation/Gait Assistance  5: Supervision;6: Modified independent (Device/Increase time)    Ambulation Distance (Feet)  230 Feet plus distances between areas of gym    Assistive device  Straight cane    Gait Pattern  Step-through pattern;Lateral hip instability;Antalgic    Ambulation Surface  Indoor          Balance Exercises - 07/02/17 2232      Balance Exercises: Standing   Standing Eyes Opened  Narrow base of support (BOS);Head turns;Foam/compliant surface on green theraband oval foam    Standing Eyes Closed  Narrow base of support (BOS);Head turns;Foam/compliant surface on green theraband oval foam    SLS with Vectors  Foam/compliant surface;Upper extremity assist 1 heel taps, toe taps to foam bubbles on top of blue mat    Rockerboard  Anterior/posterior;Lateral;Head turns;EO;EC;Intermittent UE support    Balance Beam  blue foam beam walking  tandem fwd, backward; tandem with tap to floor then step fwd, backward; sidestepping    Tandem Gait  Forward;Retro;Upper extremity support on blue mat    Sidestepping  Foam/compliant support blue mat    Step Over Hurdles / Cones  6" hurdles forward, sideways on blue mat          PT Short Term Goals - 06/19/17 2017      PT SHORT TERM GOAL #1   Title  Pt will demonstrate independence with initial HEP    Baseline  06/05/17: HEP advanced today     Status  Achieved      PT SHORT TERM GOAL #2   Title  Pt will improve five time sit to stand to </= 17 seconds with use of UE    Baseline  21.25 to 11.90 secs without UE support on 06/04/17    Status  Achieved      PT SHORT TERM GOAL #3   Title  Pt will improve BERG score to >/= 46/56    Baseline  41/56 significant falls risk to 42/56 on 06/04/17    Status  Partially Met      PT SHORT TERM GOAL #4    Title  Pt will improve TUG, without use of cane) to </= 12 seconds    Baseline  12.18 secs without cane with cues for improved turning on 06/04/17    Status  Partially Met      PT SHORT TERM GOAL #5   Title  Pt will negotiate curb with cane and supervision    Time  4    Period  Weeks    Status  Achieved        PT Long Term Goals - 06/19/17 1458      PT LONG TERM GOAL #1   Title  Pt will demonstrate independence with balance, strengthening HEP    Time  12    Period  Weeks    Status  New    Target Date  08/07/17      PT LONG TERM GOAL #2   Title  Pt will improve BERG to >/= 50/56    Time  12    Period  Weeks    Status  New    Target Date  08/07/17      PT LONG TERM GOAL #3   Title  Pt will improve five time sit to stand </= 13 seconds with UE support    Baseline  11    Time  12    Period  Weeks    Status  Achieved      PT LONG TERM GOAL #4   Title  Pt will ambulate x 200' over paved surfaces and up/down community curb with cane MOD I    Time  12    Period  Weeks    Status  New    Target Date  08/07/17      PT LONG TERM GOAL #5   Title  Pt will report being able to stand in kitchen to fix a meal x 10-15 minutes without using stool    Time  12    Period  Weeks    Status  New    Target Date  08/07/17            Plan - 07/02/17 2237    Clinical Impression Statement  Session focused on balance training for improved use of hip and ankle strategies, hip and ankle strengthening, and use of vestibular  system to maintain balance. Patient with continued imbalance with visual input removed and somatosensory input diminished through use of compliant surfaces. Continue with POC to work towards achieving LTGs    Rehab Potential  Good    PT Frequency  2x / week    PT Duration  12 weeks will be on hold for a few weeks, return to [place]    PT Treatment/Interventions  ADLs/Self Care Home Management;Canalith Repostioning;Moist Heat;Cryotherapy;Electrical Stimulation;DME  Instruction;Gait training;Stair training;Functional mobility training;Therapeutic activities;Therapeutic exercise;Balance training;Neuromuscular re-education;Patient/family education;Manual techniques;Passive range of motion;Taping;Vestibular    PT Next Visit Plan  Continue SLS, pivoting/changing direction, multi-sensory balance training on balance beam/compliant surfaces, progress corner balance, strengthening of core/LE's (avoid extension due to stenosis/disc bulging)    Consulted and Agree with Plan of Care  Patient       Patient will benefit from skilled therapeutic intervention in order to improve the following deficits and impairments:  Abnormal gait, Decreased balance, Decreased strength, Difficulty walking, Dizziness, Increased edema, Impaired sensation, Pain  Visit Diagnosis: Unsteadiness on feet  Other abnormalities of gait and mobility  Muscle weakness (generalized)  Other disturbances of skin sensation     Problem List Patient Active Problem List   Diagnosis Date Noted  . Acute pain of right knee 06/03/2017  . Loose stools 05/30/2017  . Chronic diarrhea 05/30/2017  . 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 Osteoarthritis of right knee 05/27/2017  . Labyrinthitis 04/18/2017  . Need for assistance due to unsteady gait 04/18/2017  . Age-related osteoporosis without current pathological fracture 01/23/2017  . Acquired hypothyroidism 01/23/2017  . History of endometrial cancer 01/23/2017    Rexanne Mano, PT 07/02/2017, 10:41 PM  [facility] [address] [place], [place], [address] Phone: [phone]   Fax:  [phone]  Name: Katie [name] MRN: [id number] Date of Birth: 1940/06/30

## 2017-07-03 ENCOUNTER — Ambulatory Visit: Payer: Self-pay | Admitting: *Deleted

## 2017-07-03 ENCOUNTER — Telehealth: Payer: Self-pay | Admitting: Gastroenterology

## 2017-07-03 NOTE — Telephone Encounter (Signed)
The pt is calling to update on her condition.  The pt states she has improved on cholestyramine once daily.  Stools are formed.

## 2017-07-03 NOTE — Telephone Encounter (Addendum)
Pt is taking  Fosamax  And  Questran  Pt has been fosamax  For  Several month and questrian for about 1  Month.This pain came with a sudden onset 3  Days ago  And had one subsequent pain - but at this time she has no pain. Appointment made for tomorrow with Dr Ethelene Hal.    Reason for Disposition . MODERATE pain (e.g., interferes with normal activities or awakens from sleep)  Answer Assessment - Initial Assessment Questions 1. LOCATION: "Where does it hurt?" (e.g., left, right)     Left flank   2. ONSET: "When did the pain start?"      3  Days ago   3. SEVERITY: "How bad is the pain?" (e.g., Scale 1-10; mild, moderate, or severe)   - MILD (1-3): doesn't interfere with normal activities    - MODERATE (4-7): interferes with normal activities or awakens from sleep    - SEVERE (8-10): excruciating pain and patient unable to do normal activities (stays in bed)        No pain at this time  - when the pain occurred it was  Severe   4. PATTERN: "Does the pain come and go, or is it constant?"       Comes  And  Goes   5. CAUSE: "What do you think is causing the pain?"       Possible medication   6. OTHER SYMPTOMS:  "Do you have any other symptoms?" (e.g., fever, abdominal pain, vomiting, leg weakness, burning with urination, blood in urine)      no 7. PREGNANCY:  "Is there any chance you are pregnant?" "When was your last menstrual period?"     n/a  Protocols used: FLANK PAIN-A-AH

## 2017-07-03 NOTE — Telephone Encounter (Signed)
Great to hear!

## 2017-07-03 NOTE — Telephone Encounter (Signed)
The pt was on the other line and will call back to discuss condition.

## 2017-07-04 ENCOUNTER — Ambulatory Visit: Payer: Medicare Other | Admitting: Physical Therapy

## 2017-07-04 ENCOUNTER — Encounter: Payer: Self-pay | Admitting: Family Medicine

## 2017-07-04 ENCOUNTER — Ambulatory Visit (INDEPENDENT_AMBULATORY_CARE_PROVIDER_SITE_OTHER): Payer: Medicare Other | Admitting: Family Medicine

## 2017-07-04 ENCOUNTER — Encounter: Payer: Self-pay | Admitting: Physical Therapy

## 2017-07-04 VITALS — BP 128/80 | HR 71 | Ht 60.0 in | Wt 143.2 lb

## 2017-07-04 DIAGNOSIS — R2681 Unsteadiness on feet: Secondary | ICD-10-CM | POA: Diagnosis not present

## 2017-07-04 DIAGNOSIS — R2689 Other abnormalities of gait and mobility: Secondary | ICD-10-CM

## 2017-07-04 DIAGNOSIS — E039 Hypothyroidism, unspecified: Secondary | ICD-10-CM

## 2017-07-04 DIAGNOSIS — R296 Repeated falls: Secondary | ICD-10-CM | POA: Diagnosis not present

## 2017-07-04 DIAGNOSIS — Z8542 Personal history of malignant neoplasm of other parts of uterus: Secondary | ICD-10-CM

## 2017-07-04 DIAGNOSIS — R1012 Left upper quadrant pain: Secondary | ICD-10-CM

## 2017-07-04 DIAGNOSIS — M6281 Muscle weakness (generalized): Secondary | ICD-10-CM

## 2017-07-04 DIAGNOSIS — R109 Unspecified abdominal pain: Secondary | ICD-10-CM | POA: Diagnosis not present

## 2017-07-04 DIAGNOSIS — R208 Other disturbances of skin sensation: Secondary | ICD-10-CM | POA: Diagnosis not present

## 2017-07-04 DIAGNOSIS — R42 Dizziness and giddiness: Secondary | ICD-10-CM | POA: Diagnosis not present

## 2017-07-04 LAB — URINALYSIS, ROUTINE W REFLEX MICROSCOPIC
Bilirubin Urine: NEGATIVE
Hgb urine dipstick: NEGATIVE
Ketones, ur: NEGATIVE
Leukocytes, UA: NEGATIVE
Nitrite: NEGATIVE
RBC / HPF: NONE SEEN (ref 0–?)
Specific Gravity, Urine: 1.025 (ref 1.000–1.030)
Total Protein, Urine: NEGATIVE
Urine Glucose: NEGATIVE
Urobilinogen, UA: 0.2 (ref 0.0–1.0)
WBC, UA: NONE SEEN (ref 0–?)
pH: 5.5 (ref 5.0–8.0)

## 2017-07-04 LAB — BASIC METABOLIC PANEL
BUN: 15 mg/dL (ref 6–23)
CO2: 27 mEq/L (ref 19–32)
Calcium: 9.4 mg/dL (ref 8.4–10.5)
Chloride: 100 mEq/L (ref 96–112)
Creatinine, Ser: 0.84 mg/dL (ref 0.40–1.20)
GFR: 69.57 mL/min (ref >60.00)
Glucose, Bld: 96 mg/dL (ref 70–99)
Potassium: 4.8 mEq/L (ref 3.5–5.1)
Sodium: 134 mEq/L — ABNORMAL LOW (ref 135–145)

## 2017-07-04 LAB — CBC
HCT: 37 % (ref 36.0–46.0)
Hemoglobin: 12.3 g/dL (ref 12.0–15.0)
MCHC: 33.3 g/dL (ref 30.0–36.0)
MCV: 90.5 fl (ref 78.0–100.0)
Platelets: 315 10*3/uL (ref 150.0–400.0)
RBC: 4.09 Mil/uL (ref 3.87–5.11)
RDW: 14.5 % (ref 11.5–15.5)
WBC: 4.5 10*3/uL (ref 4.0–10.5)

## 2017-07-04 LAB — TSH: TSH: 4.97 u[IU]/mL — ABNORMAL HIGH (ref 0.35–4.50)

## 2017-07-04 MED ORDER — TAMSULOSIN HCL 0.4 MG PO CAPS: 0.4000 mg | ORAL_CAPSULE | Freq: Every day | ORAL | 3 refills | Status: DC

## 2017-07-04 MED ORDER — KETOROLAC TROMETHAMINE 10 MG PO TABS: 10.0000 mg | ORAL_TABLET | Freq: Four times a day (QID) | ORAL | 0 refills | Status: DC | PRN

## 2017-07-04 NOTE — Progress Notes (Addendum)
Subjective:  Patient ID: Katie [name], female    DOB: [birth year]  Age: 77 y.o. MRN: [id number]  CC: Flank Pain   HPI Katie [name] presents for evaluation of 2 brief episodes of sharp stabbing left lower flank pain that was nonradiating.  She denies a history of kidney stones.  There is been no hematuria.  Denies fevers chills nausea vomiting weight loss or night sweats.  He has a past medical history of endometrial cancer and was released from care in 2010.  She is status post cholecystectomy for single large gallstone she tells me.  She is compliant with her thyroid medicine.  She is taking the Fosamax without side effects.  Outpatient Medications Prior to Visit  Medication Sig Dispense Refill  . alendronate (FOSAMAX) 70 MG tablet Take 1 tablet (70 mg total) by mouth every 7 (seven) days. Take with a full glass of water on an empty stomach. 4 tablet 11  . bimatoprost (LUMIGAN) 0.01 % SOLN 1 drop at bedtime.    Marland Kitchen BIOTIN PO Take 1 tablet by mouth daily.    . brimonidine (ALPHAGAN P) 0.1 % SOLN Use in the morning and evening.    . Calcium Citrate-Vitamin D (CALCIUM CITRATE + D PO) Take by mouth 2 (two) times daily.    . cholestyramine (QUESTRAN) 4 g packet Take 1 packet (4 g total) by mouth 2 (two) times daily. 60 each 2  . esomeprazole (NEXIUM) 20 MG capsule Take 20 mg by mouth daily at 12 noon.    . pyridOXINE (VITAMIN B-6) 100 MG tablet Take 100 mg by mouth daily.    . cetirizine (ZYRTEC) 10 MG chewable tablet Chew 10 mg by mouth daily.    Marland Kitchen levothyroxine (SYNTHROID, LEVOTHROID) 50 MCG tablet Take 1 tablet (50 mcg total) by mouth daily before breakfast. 100 tablet 0   No facility-administered medications prior to visit.     ROS Review of Systems  Constitutional: Negative for activity change, appetite change, chills, fatigue, fever and unexpected weight change.  HENT: Negative.   Eyes: Negative.   Respiratory: Negative.   Cardiovascular: Negative.   Gastrointestinal:  Positive for abdominal pain. Negative for abdominal distention, anal bleeding, blood in stool, constipation, diarrhea, nausea, rectal pain and vomiting.  Endocrine: Negative for polyphagia and polyuria.  Genitourinary: Positive for flank pain. Negative for difficulty urinating, dysuria, frequency, hematuria and urgency.  Musculoskeletal: Negative for arthralgias and myalgias.  Skin: Negative for rash.  Allergic/Immunologic: Negative for immunocompromised state.  Neurological: Negative for weakness and headaches.  Hematological: Does not bruise/bleed easily.  Psychiatric/Behavioral: Negative.     Objective:  BP 128/80   Pulse 71   Ht 5' (1.524 m)   Wt 143 lb 4 oz (65 kg)   SpO2 98%   BMI 27.98 kg/m   BP Readings from Last 3 Encounters:  07/04/17 128/80  06/03/17 128/64  05/30/17 124/80    Wt Readings from Last 3 Encounters:  07/04/17 143 lb 4 oz (65 kg)  06/03/17 145 lb (65.8 kg)  05/30/17 145 lb (65.8 kg)    Physical Exam  Constitutional: She is oriented to person, place, and time. She appears well-developed and well-nourished. No distress.  HENT:  Head: Normocephalic and atraumatic.  Right Ear: External ear normal.  Left Ear: External ear normal.  Nose: Nose normal.  Mouth/Throat: Oropharynx is clear and moist. No oropharyngeal exudate.  Eyes: Pupils are equal, round, and reactive to light. Conjunctivae and EOM are normal. Right eye exhibits no discharge. Left eye  exhibits no discharge. No scleral icterus.  Neck: Normal range of motion. Neck supple. No JVD present. No tracheal deviation present. No thyromegaly present.  Cardiovascular: Normal rate, regular rhythm and normal heart sounds.  Pulmonary/Chest: Effort normal and breath sounds normal.  Abdominal: Soft. Bowel sounds are normal. She exhibits no distension and no mass. There is no hepatosplenomegaly, splenomegaly or hepatomegaly. There is no tenderness. There is CVA tenderness (mild left lower flank.). There is no  rebound and no guarding.  Musculoskeletal: She exhibits no edema.  Lymphadenopathy:    She has no cervical adenopathy.  Neurological: She is alert and oriented to person, place, and time.  Skin: Skin is warm and dry. She is not diaphoretic.  Psychiatric: She has a normal mood and affect. Her behavior is normal.    Lab Results  Component Value Date   WBC 4.5 07/04/2017   HGB 12.3 07/04/2017   HCT 37.0 07/04/2017   PLT 315.0 07/04/2017   GLUCOSE 96 07/04/2017   NA 134 (L) 07/04/2017   K 4.8 07/04/2017   CL 100 07/04/2017   CREATININE 0.84 07/04/2017   BUN 15 07/04/2017   CO2 27 07/04/2017   TSH 4.97 (H) 07/04/2017    Patient was never admitted.  Assessment & Plan:   Katie was seen today for flank pain.  Diagnoses and all orders for this visit:  Left flank pain -     Basic metabolic panel -     CBC -     Urinalysis, Routine w reflex microscopic -     CT Abdomen Pelvis W Contrast; Future -     Discontinue: tamsulosin (FLOMAX) 0.4 MG CAPS capsule; Take 1 capsule (0.4 mg total) by mouth daily. -     ketorolac (TORADOL) 10 MG tablet; Take 1 tablet (10 mg total) by mouth every 6 (six) hours as needed.  History of endometrial cancer -     CT Abdomen Pelvis W Contrast; Future  Acquired hypothyroidism -     TSH -     levothyroxine (SYNTHROID, LEVOTHROID) 75 MCG tablet; Take 1 tablet (75 mcg total) by mouth daily.  Left upper quadrant pain -     CT Abdomen Pelvis W Contrast; Future   I have discontinued Katie [name]'s levothyroxine, cetirizine, and tamsulosin. I am also having her start on ketorolac and levothyroxine. Additionally, I am having her maintain her brimonidine, bimatoprost, esomeprazole, pyridOXINE, BIOTIN PO, alendronate, Calcium Citrate-Vitamin D (CALCIUM CITRATE + D PO), and cholestyramine.  Meds ordered this encounter  Medications  . DISCONTD: tamsulosin (FLOMAX) 0.4 MG CAPS capsule    Sig: Take 1 capsule (0.4 mg total) by mouth daily.     Dispense:  30 capsule    Refill:  3  . ketorolac (TORADOL) 10 MG tablet    Sig: Take 1 tablet (10 mg total) by mouth every 6 (six) hours as needed.    Dispense:  20 tablet    Refill:  0  . levothyroxine (SYNTHROID, LEVOTHROID) 75 MCG tablet    Sig: Take 1 tablet (75 mcg total) by mouth daily.    Dispense:  90 tablet    Refill:  1     Follow-up: Return if symptoms worsen or fail to improve.  Libby Maw, MD

## 2017-07-04 NOTE — Patient Instructions (Signed)
Flank Pain, Adult Flank pain is pain that is located on the side of the body between the upper abdomen and the back. This area is called the flank. The pain may occur over a short period of time (acute), or it may be long-term or recurring (chronic). It may be mild or severe. Flank pain can be caused by many things, including:  Muscle soreness or injury.  Kidney stones or kidney disease.  Stress.  A disease of the spine (vertebral disk disease).  A lung infection (pneumonia).  Fluid around the lungs (pulmonary edema).  A skin rash caused by the chickenpox virus (shingles).  Tumors that affect the back of the abdomen.  Gallbladder disease.  Follow these instructions at home:  Drink enough fluid to keep your urine clear or pale yellow.  Rest as told by your health care provider.  Take over-the-counter and prescription medicines only as told by your health care provider.  Keep a journal to track what has caused your flank pain and what has made it feel better.  Keep all follow-up visits as told by your health care provider. This is important. Contact a health care provider if:  Your pain is not controlled with medicine.  You have new symptoms.  Your pain gets worse.  You have a fever.  Your symptoms last longer than 2-3 days.  You have trouble urinating or you are urinating very frequently. Get help right away if:  You have trouble breathing or you are short of breath.  Your abdomen hurts or it is swollen or red.  You have nausea or vomiting.  You feel faint or you pass out.  You have blood in your urine. Summary  Flank pain is pain that is located on the side of the body between the upper abdomen and the back.  The pain may occur over a short period of time (acute), or it may be long-term or recurring (chronic). It may be mild or severe.  Flank pain can be caused by many things.  Contact your health care provider if your symptoms get worse or they last  longer than 2-3 days. This information is not intended to replace advice given to you by your health care provider. Make sure you discuss any questions you have with your health care provider. Document Released: 03/15/2005 Document Revised: 04/06/2016 Document Reviewed: 04/06/2016 Elsevier Interactive Patient Education  2018 [employer]  

## 2017-07-04 NOTE — Addendum Note (Signed)
Addended by: Kateri Mc E on: 07/04/2017 10:13 AM   Modules accepted: Orders

## 2017-07-05 DIAGNOSIS — H401133 Primary open-angle glaucoma, bilateral, severe stage: Secondary | ICD-10-CM | POA: Diagnosis not present

## 2017-07-05 MED ORDER — LEVOTHYROXINE SODIUM 75 MCG PO TABS
75.0000 ug | ORAL_TABLET | Freq: Every day | ORAL | 1 refills | Status: DC
Start: 2017-07-05 — End: 2017-08-26

## 2017-07-05 NOTE — Therapy (Signed)
[facility] [address] [place], [place], [address] Phone: [phone]   Fax:  [phone]  Physical Therapy Treatment  Patient Details  Name: Katie [name] MRN: [id number] Date of Birth: [birth year] Referring Provider: Libby Maw, MD   Encounter Date: 07/04/2017  PT End of Session - 07/04/17 1623    Visit Number  15    Number of Visits  17    Date for PT Re-Evaluation  08/07/17    Authorization Type  Medicare and [employer]    PT Start Time  1620    PT Stop Time  1700    PT Time Calculation (min)  40 min    Equipment Utilized During Treatment  Gait belt    Activity Tolerance  Patient tolerated treatment well    Behavior During Therapy  [facility] for tasks assessed/performed       Past Medical History:  Diagnosis Date  . Arthritis   . Cancer (HCC)    Endometrial  . Glaucoma   . Thyroid disease     Past Surgical History:  Procedure Laterality Date  . ABDOMINAL HYSTERECTOMY    . APPENDECTOMY    . BREAST SURGERY    . CHOLECYSTECTOMY    . FEMUR SURGERY  06/2013    There were no vitals filed for this visit.  Subjective Assessment - 07/04/17 1622    Subjective  Still having the flank tenderness, no pain at this time, on her left side. MD is going to get a CAT scan to ensure all is good. No falls.     Pertinent History  hypothyroidism, vertigo with questionable labyrinthitis, osteoporosis, endometrial cancer with chemo and radiation, peripheral neuropathy, OA, chronic back pain and fall with L hip fracture requiring fixation    Limitations  Standing;Walking    How long can you stand comfortably?  has to sit on stool in the kitchen    Patient Stated Goals  get a little stronger and decrease back pain    Currently in Pain?  No/denies    Pain Score  0-No pain          [facility] Adult PT Treatment/Exercise - 07/04/17 1637      High Level Balance   High Level Balance Activities  Marching  forwards;Marching backwards;Tandem walking;Side stepping tandem fwd/bwd,     High Level Balance Comments  blue/red mats next to counter: light to no UE support, 3 laps each/each way. min guard to min assist for balance with cues on posture, ex form and weight shifting needed.          Balance Exercises - 07/04/17 1625      Balance Exercises: Standing   Standing Eyes Closed  Wide (BOA);Head turns;Foam/compliant surface;Other reps (comment);30 secs;Limitations    Balance Beam  standing across red beam: alternating fwd stepping to floor/back onto beam, then alternating bwd stepping to floor/back onto beam. min to mod assist for balance with cues on step length, step height and weight shifitng.       Balance Exercises: Standing   Standing Eyes Closed Limitations  on open dense blue foam in hip width stance: EC no head movements, progressing to EC head movements in all directions with up to mod assist for balance.           PT Short Term Goals - 06/19/17 2017      PT SHORT TERM GOAL #1   Title  Pt will demonstrate independence with initial HEP    Baseline  06/05/17: HEP advanced today     Status  Achieved      PT SHORT TERM GOAL #2   Title  Pt will improve five time sit to stand to </= 17 seconds with use of UE    Baseline  21.25 to 11.90 secs without UE support on 06/04/17    Status  Achieved      PT SHORT TERM GOAL #3   Title  Pt will improve BERG score to >/= 46/56    Baseline  41/56 significant falls risk to 42/56 on 06/04/17    Status  Partially Met      PT SHORT TERM GOAL #4   Title  Pt will improve TUG, without use of cane) to </= 12 seconds    Baseline  12.18 secs without cane with cues for improved turning on 06/04/17    Status  Partially Met      PT SHORT TERM GOAL #5   Title  Pt will negotiate curb with cane and supervision    Time  4    Period  Weeks    Status  Achieved        PT Long Term Goals - 06/19/17 1458      PT LONG TERM GOAL #1   Title  Pt will  demonstrate independence with balance, strengthening HEP    Time  12    Period  Weeks    Status  New    Target Date  08/07/17      PT LONG TERM GOAL #2   Title  Pt will improve BERG to >/= 50/56    Time  12    Period  Weeks    Status  New    Target Date  08/07/17      PT LONG TERM GOAL #3   Title  Pt will improve five time sit to stand </= 13 seconds with UE support    Baseline  11    Time  12    Period  Weeks    Status  Achieved      PT LONG TERM GOAL #4   Title  Pt will ambulate x 200' over paved surfaces and up/down community curb with cane MOD I    Time  12    Period  Weeks    Status  New    Target Date  08/07/17      PT LONG TERM GOAL #5   Title  Pt will report being able to stand in kitchen to fix a meal x 10-15 minutes without using stool    Time  12    Period  Weeks    Status  New    Target Date  08/07/17            Plan - 07/04/17 1623    Clinical Impression Statement  Today's skilled session continued to focus on high level balance reactions with pt continuing to be challenged on complaint surfaces. Pt is making slow progress toward goals and should benefit from continued PT to progress toward unmet goals.     Rehab Potential  Good    PT Frequency  2x / week    PT Duration  12 weeks will be on hold for a few weeks, return to [place]    PT Treatment/Interventions  ADLs/Self Care Home Management;Canalith Repostioning;Moist Heat;Cryotherapy;Electrical Stimulation;DME Instruction;Gait training;Stair training;Functional mobility training;Therapeutic activities;Therapeutic exercise;Balance training;Neuromuscular re-education;Patient/family education;Manual techniques;Passive range of motion;Taping;Vestibular    PT Next Visit Plan  Continue SLS, pivoting/changing direction,  multi-sensory balance training on balance beam/compliant surfaces, progress corner balance, strengthening of core/LE's (avoid extension due to stenosis/disc bulging)    Consulted and Agree with Plan  of Care  Patient       Patient will benefit from skilled therapeutic intervention in order to improve the following deficits and impairments:  Abnormal gait, Decreased balance, Decreased strength, Difficulty walking, Dizziness, Increased edema, Impaired sensation, Pain  Visit Diagnosis: Unsteadiness on feet  Other abnormalities of gait and mobility  Muscle weakness (generalized)  Dizziness and giddiness     Problem List Patient Active Problem List   Diagnosis Date Noted  . Acute pain of right knee 06/03/2017  . Loose stools 05/30/2017  . Chronic diarrhea 05/30/2017  . 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 Osteoarthritis of right knee 05/27/2017  . Labyrinthitis 04/18/2017  . Need for assistance due to unsteady gait 04/18/2017  . Age-related osteoporosis without current pathological fracture 01/23/2017  . Acquired hypothyroidism 01/23/2017  . History of endometrial cancer 01/23/2017    Willow Ora, PTA, [facility] Outpatient Neuro [facility] [address] [place], [place] [address] [phone] 07/05/17, 9:08 PM   Name: Katie [name] MRN: [id number] Date of Birth: [birth year]

## 2017-07-05 NOTE — Addendum Note (Signed)
Addended by: Abelino Derrick A on: 07/05/2017 02:07 PM   Modules accepted: Orders

## 2017-07-09 ENCOUNTER — Encounter: Payer: Self-pay | Admitting: Physical Therapy

## 2017-07-09 ENCOUNTER — Ambulatory Visit: Payer: Medicare Other | Attending: Family Medicine | Admitting: Physical Therapy

## 2017-07-09 DIAGNOSIS — M6281 Muscle weakness (generalized): Secondary | ICD-10-CM | POA: Insufficient documentation

## 2017-07-09 DIAGNOSIS — R296 Repeated falls: Secondary | ICD-10-CM | POA: Diagnosis not present

## 2017-07-09 DIAGNOSIS — R2681 Unsteadiness on feet: Secondary | ICD-10-CM | POA: Insufficient documentation

## 2017-07-09 DIAGNOSIS — R2689 Other abnormalities of gait and mobility: Secondary | ICD-10-CM | POA: Insufficient documentation

## 2017-07-09 DIAGNOSIS — R208 Other disturbances of skin sensation: Secondary | ICD-10-CM | POA: Diagnosis not present

## 2017-07-10 ENCOUNTER — Ambulatory Visit: Payer: Medicare Other | Admitting: Podiatry

## 2017-07-10 NOTE — Therapy (Signed)
[facility] [address] [place], [place], [address] Phone: [phone]   Fax:  [phone]  Physical Therapy Treatment  Patient Details  Name: Katie [name] MRN: [id number] Date of Birth: [birth year] Referring Provider: Libby Maw, MD   Encounter Date: 07/09/2017  PT End of Session - 07/09/17 2329    Visit Number  16    Number of Visits  25    Date for PT Re-Evaluation  08/07/17    Authorization Type  Medicare and [employer]    PT Start Time  6384    PT Stop Time  1623    PT Time Calculation (min)  45 min    Equipment Utilized During Treatment  Gait belt    Activity Tolerance  Patient tolerated treatment well    Behavior During Therapy  [facility] for tasks assessed/performed       Past Medical History:  Diagnosis Date  . Arthritis   . Cancer (HCC)    Endometrial  . Glaucoma   . Thyroid disease     Past Surgical History:  Procedure Laterality Date  . ABDOMINAL HYSTERECTOMY    . APPENDECTOMY    . BREAST SURGERY    . CHOLECYSTECTOMY    . FEMUR SURGERY  06/2013    There were no vitals filed for this visit.  Subjective Assessment - 07/09/17 1540    Subjective  No falls. She has been half way doing her exercises.     Pertinent History  hypothyroidism, vertigo with questionable labyrinthitis, osteoporosis, endometrial cancer with chemo and radiation, peripheral neuropathy, OA, chronic back pain and fall with L hip fracture requiring fixation    Limitations  Standing;Walking    How long can you stand comfortably?  has to sit on stool in the kitchen    Patient Stated Goals  get a little stronger and decrease back pain    Currently in Pain?  No/denies         [facility] PT Assessment - 07/09/17 1540      Ambulation/Gait   Gait velocity  2.55 ft/sec      Berg Balance Test   Sit to Stand  Able to stand without using hands and stabilize independently    Standing Unsupported  Able to stand safely 2  minutes    Sitting with Back Unsupported but Feet Supported on Floor or Stool  Able to sit safely and securely 2 minutes    Stand to Sit  Sits safely with minimal use of hands    Transfers  Able to transfer safely, minor use of hands    Standing Unsupported with Eyes Closed  Able to stand 10 seconds safely    Standing Ubsupported with Feet Together  Able to place feet together independently and stand 1 minute safely    From Standing, Reach Forward with Outstretched Arm  Can reach confidently >25 cm (10")    From Standing Position, Pick up Object from Floor  Able to pick up shoe safely and easily    From Standing Position, Turn to Look Behind Over each Shoulder  Looks behind from both sides and weight shifts well    Turn 360 Degrees  Able to turn 360 degrees safely but slowly    Standing Unsupported, Alternately Place Feet on Step/Stool  Able to complete 4 steps without aid or supervision    Standing Unsupported, One Foot in Front  Able to plae foot ahead of the other independently and hold 30 seconds  Standing on One Leg  Tries to lift leg/unable to hold 3 seconds but remains standing independently    Total Score  48      Functional Gait  Assessment   Gait assessed   Yes    Gait Level Surface  Walks 20 ft, slow speed, abnormal gait pattern, evidence for imbalance or deviates 10-15 in outside of the 12 in walkway width. Requires more than 7 sec to ambulate 20 ft.    Change in Gait Speed  Makes only minor adjustments to walking speed, or accomplishes a change in speed with significant gait deviations, deviates 10-15 in outside the 12 in walkway width, or changes speed but loses balance but is able to recover and continue walking.    Gait with Horizontal Head Turns  Performs head turns with moderate changes in gait velocity, slows down, deviates 10-15 in outside 12 in walkway width but recovers, can continue to walk.    Gait with Vertical Head Turns  Performs task with moderate change in gait  velocity, slows down, deviates 10-15 in outside 12 in walkway width but recovers, can continue to walk.    Gait and Pivot Turn  Turns slowly, requires verbal cueing, or requires several small steps to catch balance following turn and stop    Step Over Obstacle  Cannot perform without assistance.    Gait with Narrow Base of Support  Ambulates less than 4 steps heel to toe or cannot perform without assistance.    Gait with Eyes Closed  Cannot walk 20 ft without assistance, severe gait deviations or imbalance, deviates greater than 15 in outside 12 in walkway width or will not attempt task.    Ambulating Backwards  Walks 20 ft, slow speed, abnormal gait pattern, evidence for imbalance, deviates 10-15 in outside 12 in walkway width.    Steps  Two feet to a stair, must use rail.    Total Score  7                   [facility] Adult PT Treatment/Exercise - 07/09/17 1540      Ambulation/Gait   Ambulation/Gait  Yes    Ambulation/Gait Assistance  4: Min assist;5: Supervision MinA FGA tasks    Ambulation Distance (Feet)  250 Feet    Assistive device  Straight cane;None    Ambulation Surface  Indoor;Level    Ramp  5: Supervision cane    Curb  5: Supervision cane      High Level Balance   High Level Balance Activities  Head turns    High Level Balance Comments  gait in hall for visual reference to maintain path & tactile cues.             PT Education - 07/09/17 1610    Education provided  Yes    Education Details  elevation of LE for edema with heart / trunk supine and LEs elevated for gravity assist & ankle activities. PT recommended 15-20 min 2x/day    Person(s) Educated  Patient    Methods  Explanation;Verbal cues    Comprehension  Verbalized understanding       PT Short Term Goals - 07/09/17 2016      PT SHORT TERM GOAL #1   Title  Berg Balance >45/56    Baseline  MET 07/09/2017    Status  Achieved    Target Date  07/09/17      PT SHORT TERM GOAL #2   Title  Gait  Velocity >  2.50 ft/sec    Baseline  MET 07/09/2017  Gait Velocity 2.55 ft/sec    Status  Achieved    Target Date  07/09/17        PT Long Term Goals - 06/19/17 1458      PT LONG TERM GOAL #1   Title  Pt will demonstrate independence with balance, strengthening HEP    Time  12    Period  Weeks    Status  New    Target Date  08/07/17      PT LONG TERM GOAL #2   Title  Pt will improve BERG to >/= 50/56    Time  12    Period  Weeks    Status  New    Target Date  08/07/17      PT LONG TERM GOAL #3   Title  Pt will improve five time sit to stand </= 13 seconds with UE support    Baseline  11    Time  12    Period  Weeks    Status  Achieved      PT LONG TERM GOAL #4   Title  Pt will ambulate x 200' over paved surfaces and up/down community curb with cane MOD I    Time  12    Period  Weeks    Status  New    Target Date  08/07/17      PT LONG TERM GOAL #5   Title  Pt will report being able to stand in kitchen to fix a meal x 10-15 minutes without using stool    Time  12    Period  Weeks    Status  New    Target Date  08/07/17            Plan - 07/09/17 2023    Clinical Impression Statement  Patient met STGs. Functional Gait Assessment 7/30 indicates high risk for falls with gait activities including scanning & negotiating obstacles.     Rehab Potential  Good    PT Frequency  2x / week    PT Duration  12 weeks will be on hold for a few weeks, return to [place]    PT Treatment/Interventions  ADLs/Self Care Home Management;Canalith Repostioning;Moist Heat;Cryotherapy;Electrical Stimulation;DME Instruction;Gait training;Stair training;Functional mobility training;Therapeutic activities;Therapeutic exercise;Balance training;Neuromuscular re-education;Patient/family education;Manual techniques;Passive range of motion;Taping;Vestibular    PT Next Visit Plan  Continue SLS, pivoting/changing direction, multi-sensory balance training on balance beam/compliant surfaces, progress corner  balance, strengthening of core/LE's (avoid extension due to stenosis/disc bulging)    Consulted and Agree with Plan of Care  Patient       Patient will benefit from skilled therapeutic intervention in order to improve the following deficits and impairments:  Abnormal gait, Decreased balance, Decreased strength, Difficulty walking, Dizziness, Increased edema, Impaired sensation, Pain  Visit Diagnosis: Unsteadiness on feet  Other abnormalities of gait and mobility  Muscle weakness (generalized)  Other disturbances of skin sensation     Problem List Patient Active Problem List   Diagnosis Date Noted  . Acute pain of right knee 06/03/2017  . Loose stools 05/30/2017  . Chronic diarrhea 05/30/2017  . 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 Osteoarthritis of right knee 05/27/2017  . Labyrinthitis 04/18/2017  . Need for assistance due to unsteady gait 04/18/2017  . Age-related osteoporosis without current pathological fracture 01/23/2017  . Acquired hypothyroidism 01/23/2017  . History of endometrial cancer 01/23/2017    Aneth Schlagel PT, DPT  07/10/2017, 8:25 PM  [facility] [address] [place], [place], [address] Phone: [phone]   Fax:  [phone]  Name: Katie [name] MRN: [id number] Date of Birth: 1940/06/20

## 2017-07-11 ENCOUNTER — Encounter (HOSPITAL_COMMUNITY): Payer: Self-pay

## 2017-07-11 ENCOUNTER — Ambulatory Visit (HOSPITAL_COMMUNITY)
Admission: RE | Admit: 2017-07-11 | Discharge: 2017-07-11 | Disposition: A | Discharge status: Home / Self Care | Payer: Medicare Other | Source: Ambulatory Visit | Attending: Family Medicine | Admitting: Family Medicine

## 2017-07-11 DIAGNOSIS — R1012 Left upper quadrant pain: Secondary | ICD-10-CM | POA: Insufficient documentation

## 2017-07-11 DIAGNOSIS — Z8542 Personal history of malignant neoplasm of other parts of uterus: Secondary | ICD-10-CM | POA: Diagnosis not present

## 2017-07-11 DIAGNOSIS — K5730 Diverticulosis of large intestine without perforation or abscess without bleeding: Secondary | ICD-10-CM | POA: Insufficient documentation

## 2017-07-11 DIAGNOSIS — R109 Unspecified abdominal pain: Secondary | ICD-10-CM | POA: Diagnosis not present

## 2017-07-11 DIAGNOSIS — K4090 Unilateral inguinal hernia, without obstruction or gangrene, not specified as recurrent: Secondary | ICD-10-CM | POA: Diagnosis not present

## 2017-07-11 MED ORDER — IOPAMIDOL (ISOVUE-300) INJECTION 61%
INTRAVENOUS | Status: AC
  Administered 2017-07-11: 100 mL
  Filled 2017-07-11: qty 100

## 2017-07-12 ENCOUNTER — Encounter: Payer: Self-pay | Admitting: Physical Therapy

## 2017-07-12 ENCOUNTER — Ambulatory Visit: Payer: Medicare Other | Admitting: Physical Therapy

## 2017-07-12 DIAGNOSIS — R2681 Unsteadiness on feet: Secondary | ICD-10-CM

## 2017-07-12 DIAGNOSIS — R2689 Other abnormalities of gait and mobility: Secondary | ICD-10-CM | POA: Diagnosis not present

## 2017-07-12 DIAGNOSIS — R208 Other disturbances of skin sensation: Secondary | ICD-10-CM | POA: Diagnosis not present

## 2017-07-12 DIAGNOSIS — M6281 Muscle weakness (generalized): Secondary | ICD-10-CM

## 2017-07-12 DIAGNOSIS — R296 Repeated falls: Secondary | ICD-10-CM | POA: Diagnosis not present

## 2017-07-12 NOTE — Therapy (Signed)
[facility] [address] [place], [place], [address] Phone: [phone]   Fax:  [phone]  Physical Therapy Treatment  Patient Details  Name: Katie [name] MRN: [id number] Date of Birth: [birth year] Referring Provider: Libby Maw, MD   Encounter Date: 07/12/2017  PT End of Session - 07/12/17 1418    Visit Number  17    Number of Visits  25    Date for PT Re-Evaluation  08/07/17    Authorization Type  Medicare and [employer]    PT Start Time  1320    PT Stop Time  1408    PT Time Calculation (min)  48 min    Activity Tolerance  Patient tolerated treatment well    Behavior During Therapy  [facility] for tasks assessed/performed       Past Medical History:  Diagnosis Date  . Arthritis   . Cancer (HCC)    Endometrial  . Glaucoma   . Thyroid disease     Past Surgical History:  Procedure Laterality Date  . ABDOMINAL HYSTERECTOMY    . APPENDECTOMY    . BREAST SURGERY    . CHOLECYSTECTOMY    . FEMUR SURGERY  06/2013    There were no vitals filed for this visit.  Subjective Assessment - 07/12/17 1326    Subjective  L flank pain is still sore, had CT yesterday of abdominal region.    Pertinent History  hypothyroidism, vertigo with questionable labyrinthitis, osteoporosis, endometrial cancer with chemo and radiation, peripheral neuropathy, OA, chronic back pain and fall with L hip fracture requiring fixation    Limitations  Standing;Walking    How long can you stand comfortably?  has to sit on stool in the kitchen    Patient Stated Goals  get a little stronger and decrease back pain    Currently in Pain?  Yes                            Balance Exercises - 07/12/17 1412      Balance Exercises: Standing   SLS with Vectors  Solid surface;Upper extremity assist 2;Other reps (comment) 10 reps, green theraband, PNF patterns R/LLE    Balance Beam  blue foam beam performing anterior  cross overs to R and L x 4 reps each direction with one UE support    Turning  Right;Left;10 reps on compliant foam, 90 deg turns, UE > no UE support    Cone Rotation  Solid surface;Right turn;Left turn    Cone Rotation Limitations  forwards x 8; no UE support but with min A from therapist for stability and cues for technique and full step through.  On last repetition added resistance of green theraband around pelvis to facilitate increased forward weight shift        PT Education - 07/12/17 1417    Education provided  Yes    Education Details  turning/pivoting safely; added more visits through end of POC    Person(s) Educated  Patient    Methods  Tactile cues;Verbal cues    Comprehension  Need further instruction       PT Short Term Goals - 07/09/17 2016      PT SHORT TERM GOAL #1   Title  Berg Balance >45/56    Baseline  MET 07/09/2017    Status  Achieved    Target Date  07/09/17      PT SHORT TERM GOAL #2  Title  Gait Velocity >2.50 ft/sec    Baseline  MET 07/09/2017  Gait Velocity 2.55 ft/sec    Status  Achieved    Target Date  07/09/17        PT Long Term Goals - 06/19/17 1458      PT LONG TERM GOAL #1   Title  Pt will demonstrate independence with balance, strengthening HEP    Time  12    Period  Weeks    Status  New    Target Date  08/07/17      PT LONG TERM GOAL #2   Title  Pt will improve BERG to >/= 50/56    Time  12    Period  Weeks    Status  New    Target Date  08/07/17      PT LONG TERM GOAL #3   Title  Pt will improve five time sit to stand </= 13 seconds with UE support    Baseline  11    Time  12    Period  Weeks    Status  Achieved      PT LONG TERM GOAL #4   Title  Pt will ambulate x 200' over paved surfaces and up/down community curb with cane MOD I    Time  12    Period  Weeks    Status  New    Target Date  08/07/17      PT LONG TERM GOAL #5   Title  Pt will report being able to stand in kitchen to fix a meal x 10-15 minutes without  using stool    Time  12    Period  Weeks    Status  New    Target Date  08/07/17            Plan - 07/12/17 1418    Clinical Impression Statement  Following assessment of STG pt continues to demonstrate increased difficulty with single limb stance and stability during pivoting/changes in direction.  Treatment session focused on balance and strength training in transverse plane with rotation on solid and compliant surfaces.  At end of session performed weaving and stepping around cones to determine carryover of pivoting technique and overall balance.  Without AD pt continues to require min A for safety due to intermittent lateral LOB, decreased step length with RLE and narrow BOS.  Visits added through end of POC to continue to work on balance.    Rehab Potential  Good    PT Frequency  2x / week    PT Duration  12 weeks will be on hold for a few weeks, return to [place]    PT Treatment/Interventions  ADLs/Self Care Home Management;Canalith Repostioning;Moist Heat;Cryotherapy;Electrical Stimulation;DME Instruction;Gait training;Stair training;Functional mobility training;Therapeutic activities;Therapeutic exercise;Balance training;Neuromuscular re-education;Patient/family education;Manual techniques;Passive range of motion;Taping;Vestibular    PT Next Visit Plan  Continue SLS, pivoting/changing direction, multi-sensory balance training on balance beam/compliant surfaces, progress corner balance, strengthening of core/LE's (avoid extension due to stenosis/disc bulging)    Consulted and Agree with Plan of Care  Patient       Patient will benefit from skilled therapeutic intervention in order to improve the following deficits and impairments:  Abnormal gait, Decreased balance, Decreased strength, Difficulty walking, Dizziness, Increased edema, Impaired sensation, Pain  Visit Diagnosis: Unsteadiness on feet  Other abnormalities of gait and mobility  Muscle weakness (generalized)  Other  disturbances of skin sensation  Repeated falls     Problem List Patient Active Problem  List   Diagnosis Date Noted  . Acute pain of right knee 06/03/2017  . Loose stools 05/30/2017  . Chronic diarrhea 05/30/2017  . 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 Osteoarthritis of right knee 05/27/2017  . Labyrinthitis 04/18/2017  . Need for assistance due to unsteady gait 04/18/2017  . Age-related osteoporosis without current pathological fracture 01/23/2017  . Acquired hypothyroidism 01/23/2017  . History of endometrial cancer 01/23/2017    Rico Junker, PT, DPT 07/12/17    2:25 PM    [facility] [address] [place], [place], [address] Phone: [phone]   Fax:  [phone]  Name: Katie [name] MRN: [id number] Date of Birth: 07/06/1940

## 2017-07-15 ENCOUNTER — Encounter: Payer: Self-pay | Admitting: Physical Therapy

## 2017-07-15 ENCOUNTER — Ambulatory Visit: Payer: Medicare Other | Admitting: Physical Therapy

## 2017-07-15 DIAGNOSIS — R2681 Unsteadiness on feet: Secondary | ICD-10-CM

## 2017-07-15 DIAGNOSIS — R208 Other disturbances of skin sensation: Secondary | ICD-10-CM | POA: Diagnosis not present

## 2017-07-15 DIAGNOSIS — M6281 Muscle weakness (generalized): Secondary | ICD-10-CM | POA: Diagnosis not present

## 2017-07-15 DIAGNOSIS — R296 Repeated falls: Secondary | ICD-10-CM | POA: Diagnosis not present

## 2017-07-15 DIAGNOSIS — R2689 Other abnormalities of gait and mobility: Secondary | ICD-10-CM

## 2017-07-16 ENCOUNTER — Ambulatory Visit: Payer: Medicare Other | Admitting: Physical Therapy

## 2017-07-16 NOTE — Therapy (Signed)
[facility] [address] [place], [place], [address] Phone: [phone]   Fax:  [phone]  Physical Therapy Treatment  Patient Details  Name: Katie [name] MRN: [id number] Date of Birth: 05/08/1940 Referring Provider: Libby Maw, MD   Encounter Date: 07/15/2017  PT End of Session - 07/15/17 1412    Visit Number  18    Number of Visits  25    Date for PT Re-Evaluation  08/07/17    Authorization Type  Medicare and [employer]    PT Start Time  1405    PT Stop Time  1445    PT Time Calculation (min)  40 min    Equipment Utilized During Treatment  Gait belt    Activity Tolerance  Patient tolerated treatment well    Behavior During Therapy  WFL for tasks assessed/performed       Past Medical History:  Diagnosis Date  . Arthritis   . Cancer (HCC)    Endometrial  . Glaucoma   . Thyroid disease     Past Surgical History:  Procedure Laterality Date  . ABDOMINAL HYSTERECTOMY    . APPENDECTOMY    . BREAST SURGERY    . CHOLECYSTECTOMY    . FEMUR SURGERY  06/2013    There were no vitals filed for this visit.  Subjective Assessment - 07/15/17 1409    Subjective  CT was negative. Now she feels it's related to her back and plan to see the MD about this. No falls.     Pertinent History  hypothyroidism, vertigo with questionable labyrinthitis, osteoporosis, endometrial cancer with chemo and radiation, peripheral neuropathy, OA, chronic back pain and fall with L hip fracture requiring fixation    Limitations  Standing;Walking    How long can you stand comfortably?  has to sit on stool in the kitchen    Patient Stated Goals  get a little stronger and decrease back pain    Pain Location  Back    Pain Orientation  Mid;Left;Lower    Pain Descriptors / Indicators  Aching;Dull    Pain Type  Chronic pain;Acute pain    Pain Onset  1 to 4 weeks ago    Pain Frequency  Intermittent    Aggravating Factors    "nothing really, it's just there"    Pain Relieving Factors  "rest is about the only thing'        [facility] Adult PT Treatment/Exercise - 07/15/17 1434      High Level Balance   High Level Balance Comments  obstacle course of red mat (bean bags scattered underneath)<>3 black bostlers in a row with space between<> 3 cones in row with space between: with cane walking over the mat, stepping over the bolsters and around the stools with a 360 degree turn after last cone to go back through/across x 2 full laps. min gaurd to min assist with cues on cane use/placement to maintain a wide base of support (not to let feet cross) to assist with balance.                                Balance Exercises - 07/15/17 1437      Balance Exercises: Standing   Standing Eyes Closed  Narrow base of support (BOS);Wide (BOA);Head turns;Foam/compliant surface;Other reps (comment);Limitations    SLS with Vectors  Foam/compliant surface;5 reps;Time;Limitations    Balance Beam  blue foam beam: alternating fwd  heel taps to floor/back onto beam, then alternating bwd toe taps to floor/back onto beam x 10 reps each side with min guard to min assist for balance. cues on posture and weight shifting.       Balance Exercises: Standing   Standing Eyes Closed Limitations  standing across blue foam beam: EC no head movements, progressing to EC head movements with min guard to min assist, no UE support and cues on posture/weight shifting to assist with balance.     SLS with Vectors Limitations  on 1 inch foam with 3 foam bubbles in a semi circle pattern: toe taps to each across/back x 3 laps each LE with no UE support. min assist for balance with cues on posture and weight shifting to assist with balance.           PT Short Term Goals - 07/09/17 2016      PT SHORT TERM GOAL #1   Title  Berg Balance >45/56    Baseline  MET 07/09/2017    Status  Achieved    Target Date  07/09/17      PT SHORT TERM GOAL #2   Title  Gait  Velocity >2.50 ft/sec    Baseline  MET 07/09/2017  Gait Velocity 2.55 ft/sec    Status  Achieved    Target Date  07/09/17        PT Long Term Goals - 06/19/17 1458      PT LONG TERM GOAL #1   Title  Pt will demonstrate independence with balance, strengthening HEP    Time  12    Period  Weeks    Status  New    Target Date  08/07/17      PT LONG TERM GOAL #2   Title  Pt will improve BERG to >/= 50/56    Time  12    Period  Weeks    Status  New    Target Date  08/07/17      PT LONG TERM GOAL #3   Title  Pt will improve five time sit to stand </= 13 seconds with UE support    Baseline  11    Time  12    Period  Weeks    Status  Achieved      PT LONG TERM GOAL #4   Title  Pt will ambulate x 200' over paved surfaces and up/down community curb with cane MOD I    Time  12    Period  Weeks    Status  New    Target Date  08/07/17      PT LONG TERM GOAL #5   Title  Pt will report being able to stand in kitchen to fix a meal x 10-15 minutes without using stool    Time  12    Period  Weeks    Status  New    Target Date  08/07/17            Plan - 07/15/17 1412    Clinical Impression Statement  Today's skilled session continued to focus on balance reactions with dynamic gait, turns and on compliant  surfaces. Pt is making steady progress and should benefit from continued PT to progress toward unmet goals.     Rehab Potential  Good    PT Frequency  2x / week    PT Duration  12 weeks will be on hold for a few weeks, return to [place]    PT Treatment/Interventions  ADLs/Self Care Home Management;Canalith Repostioning;Moist Heat;Cryotherapy;Electrical Stimulation;DME Instruction;Gait training;Stair training;Functional mobility training;Therapeutic activities;Therapeutic exercise;Balance training;Neuromuscular re-education;Patient/family education;Manual techniques;Passive range of motion;Taping;Vestibular    PT Next Visit Plan  Continue SLS, pivoting/changing direction, multi-sensory  balance training on balance beam/compliant surfaces, progress corner balance, strengthening of core/LE's (avoid extension due to stenosis/disc bulging)    Consulted and Agree with Plan of Care  Patient       Patient will benefit from skilled therapeutic intervention in order to improve the following deficits and impairments:  Abnormal gait, Decreased balance, Decreased strength, Difficulty walking, Dizziness, Increased edema, Impaired sensation, Pain  Visit Diagnosis: Unsteadiness on feet  Other abnormalities of gait and mobility  Muscle weakness (generalized)     Problem List Patient Active Problem List   Diagnosis Date Noted  . Acute pain of right knee 06/03/2017  . Loose stools 05/30/2017  . Chronic diarrhea 05/30/2017  . 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 Osteoarthritis of right knee 05/27/2017  . Labyrinthitis 04/18/2017  . Need for assistance due to unsteady gait 04/18/2017  . Age-related osteoporosis without current pathological fracture 01/23/2017  . Acquired hypothyroidism 01/23/2017  . History of endometrial cancer 01/23/2017    Willow Ora, PTA, [facility] [address], [place], [place] [address] [phone] 07/16/17, 2:42 PM   Name: Taren Dymek MRN: [id number] Date of Birth: Jun 01, 1940

## 2017-07-19 ENCOUNTER — Ambulatory Visit: Payer: Medicare Other | Admitting: Physical Therapy

## 2017-07-23 ENCOUNTER — Encounter: Payer: Self-pay | Admitting: Physical Therapy

## 2017-07-23 ENCOUNTER — Ambulatory Visit: Payer: Medicare Other | Admitting: Physical Therapy

## 2017-07-23 DIAGNOSIS — R296 Repeated falls: Secondary | ICD-10-CM

## 2017-07-23 DIAGNOSIS — M6281 Muscle weakness (generalized): Secondary | ICD-10-CM

## 2017-07-23 DIAGNOSIS — R2681 Unsteadiness on feet: Secondary | ICD-10-CM | POA: Diagnosis not present

## 2017-07-23 DIAGNOSIS — R2689 Other abnormalities of gait and mobility: Secondary | ICD-10-CM | POA: Diagnosis not present

## 2017-07-23 DIAGNOSIS — R208 Other disturbances of skin sensation: Secondary | ICD-10-CM | POA: Diagnosis not present

## 2017-07-24 NOTE — Therapy (Signed)
[facility] [address] [place], [place], [address] Phone: [phone]   Fax:  [phone]  Physical Therapy Treatment  Patient Details  Name: Katie [name] MRN: [id number] Date of Birth: [birth year] Referring Provider: Libby Maw, MD   Encounter Date: 07/23/2017     07/23/17 1107  PT Visits / Re-Eval  Visit Number 19  Number of Visits 25  Date for PT Re-Evaluation 08/07/17  Authorization  Authorization Type Medicare and [employer]  PT Time Calculation  PT Start Time 1104  PT Stop Time 1145  PT Time Calculation (min) 41 min  PT - End of Session  Equipment Utilized During Treatment Gait belt  Activity Tolerance Patient tolerated treatment well  Behavior During Therapy [facility] for tasks assessed/performed    Past Medical History:  Diagnosis Date  . Arthritis   . Cancer (HCC)    Endometrial  . Glaucoma   . Thyroid disease     Past Surgical History:  Procedure Laterality Date  . ABDOMINAL HYSTERECTOMY    . APPENDECTOMY    . BREAST SURGERY    . CHOLECYSTECTOMY    . FEMUR SURGERY  06/2013    There were no vitals filed for this visit.     07/23/17 1105  Symptoms/Limitations  Subjective Has moved to new apartment across the hallway, "it has a better view". Having issues as everything is on opposite sides of her old apartment and they also have not installed her hand held shower head.   Pertinent History hypothyroidism, vertigo with questionable labyrinthitis, osteoporosis, endometrial cancer with chemo and radiation, peripheral neuropathy, OA, chronic back pain and fall with L hip fracture requiring fixation  Limitations Standing;Walking  How long can you stand comfortably? has to sit on stool in the kitchen  Patient Stated Goals get a little stronger and decrease back pain  Pain Assessment  Currently in Pain? No/denies  Pain Score 0        07/23/17 1109  Transfers  Transfers Sit to  Stand;Stand to Sit  Sit to Stand 7: Independent  Stand to Sit 7: Independent  Ambulation/Gait  Ambulation/Gait Yes  Ambulation/Gait Assistance 4: Min guard;4: Min assist;5: Supervision  Ambulation/Gait Assistance Details cues on posture and cane placemetn. increased assistance needed on complaint surfaces (outdoors) due to unsteadiness (min guard to min assist needed for balance).                             Ambulation Distance (Feet) 350 Feet (x1, plus around gym)  Assistive device Straight cane  Gait Pattern Step-through pattern;Lateral hip instability;Antalgic  Ambulation Surface Level;Indoor;Unlevel;Paved;Outdoor;Gravel;Grass;Other (comment) (rubber mulch)  Stairs Yes  Stairs Assistance 5: Supervision  Stairs Assistance Details (indicate cue type and reason) no cues or assistance needed  Stair Management Technique One rail Right;One rail Left;Step to pattern;Forwards;With cane  Number of Stairs 4 (x 3 reps)  Height of Stairs 6      07/23/17 1121  Balance Exercises: Standing  Standing Eyes Closed Wide (BOA);Head turns;Foam/compliant surface;Other reps (comment);30 secs;Limitations  SLS with Vectors Foam/compliant surface;Other reps (comment);Limitations  Tandem Gait Forward;Retro;Upper extremity support;Foam/compliant surface;3 reps;Limitations  Sidestepping Foam/compliant support;3 reps;Limitations  Balance Exercises: Standing  Standing Eyes Closed Limitations on airex in corner with chair in front for safety: EC no head movements, progressing to EC head movements left<>right, up<>down, then diagonals both ways x 10 reps each. min guard to min assist for balance with cues needed on posture, weight  shifting to assist with balance.                       Sidestepping Limitations on blue foam beam x 3 laps with light UE support on bars, cues on posture and ex form/technique  Tandem Gait Limitations on blue foam beam x 3 laps fwd/bwd with light UE support, cues on posture and step  placement for heel to toe stepping  SLS with Vectors Time on airex with 2 tall cones in front next to counter: single UE support for fwd toe taps x 10 reps each side, min guard assist.          PT Short Term Goals - 07/09/17 2016      PT SHORT TERM GOAL #1   Title  Berg Balance >45/56    Baseline  MET 07/09/2017    Status  Achieved    Target Date  07/09/17      PT SHORT TERM GOAL #2   Title  Gait Velocity >2.50 ft/sec    Baseline  MET 07/09/2017  Gait Velocity 2.55 ft/sec    Status  Achieved    Target Date  07/09/17        PT Long Term Goals - 06/19/17 1458      PT LONG TERM GOAL #1   Title  Pt will demonstrate independence with balance, strengthening HEP    Time  12    Period  Weeks    Status  New    Target Date  08/07/17      PT LONG TERM GOAL #2   Title  Pt will improve BERG to >/= 50/56    Time  12    Period  Weeks    Status  New    Target Date  08/07/17      PT LONG TERM GOAL #3   Title  Pt will improve five time sit to stand </= 13 seconds with UE support    Baseline  11    Time  12    Period  Weeks    Status  Achieved      PT LONG TERM GOAL #4   Title  Pt will ambulate x 200' over paved surfaces and up/down community curb with cane MOD I    Time  12    Period  Weeks    Status  New    Target Date  08/07/17      PT LONG TERM GOAL #5   Title  Pt will report being able to stand in kitchen to fix a meal x 10-15 minutes without using stool    Time  12    Period  Weeks    Status  New    Target Date  08/07/17           07/23/17 1107  Plan  Clinical Impression Statement Today's skilled session continued to address gait on various surfaces and high level balance reactions with no issues reported. Pt is making steady progress toward goals and should benefit from continued PT to progress toward unmet goals.   Pt will benefit from skilled therapeutic intervention in order to improve on the following deficits Abnormal gait;Decreased balance;Decreased  strength;Difficulty walking;Dizziness;Increased edema;Impaired sensation;Pain  Rehab Potential Good  PT Frequency 2x / week  PT Duration 12 weeks (will be on hold for a few weeks, return to [place])  PT Treatment/Interventions ADLs/Self Care Home Management;Canalith Repostioning;Moist Heat;Cryotherapy;Electrical Stimulation;DME Instruction;Gait training;Stair training;Functional mobility training;Therapeutic activities;Therapeutic exercise;Balance training;Neuromuscular re-education;Patient/family  education;Manual techniques;Passive range of motion;Taping;Vestibular  PT Next Visit Plan Continue SLS, pivoting/changing direction, multi-sensory balance training on balance beam/compliant surfaces, progress corner balance, strengthening of core/LE's (avoid extension due to stenosis/disc bulging)  Consulted and Agree with Plan of Care Patient        Patient will benefit from skilled therapeutic intervention in order to improve the following deficits and impairments:  Abnormal gait, Decreased balance, Decreased strength, Difficulty walking, Dizziness, Increased edema, Impaired sensation, Pain  Visit Diagnosis: Unsteadiness on feet  Other abnormalities of gait and mobility  Muscle weakness (generalized)  Other disturbances of skin sensation  Repeated falls     Problem List Patient Active Problem List   Diagnosis Date Noted  . Acute pain of right knee 06/03/2017  . Loose stools 05/30/2017  . Chronic diarrhea 05/30/2017  . 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 Osteoarthritis of right knee 05/27/2017  . Labyrinthitis 04/18/2017  . Need for assistance due to unsteady gait 04/18/2017  . Age-related osteoporosis without current pathological fracture 01/23/2017  . Acquired hypothyroidism 01/23/2017  . History of endometrial cancer 01/23/2017    Willow Ora, PTA, [facility] [address] [place], [place] [address] [phone] 07/24/17, 10:04 PM   Name:  Katie [name] MRN: [id number] Date of Birth: Jul 26, 1940

## 2017-07-25 ENCOUNTER — Encounter: Payer: Self-pay | Admitting: Physical Therapy

## 2017-07-25 ENCOUNTER — Ambulatory Visit: Payer: Medicare Other | Admitting: Physical Therapy

## 2017-07-25 DIAGNOSIS — R296 Repeated falls: Secondary | ICD-10-CM | POA: Diagnosis not present

## 2017-07-25 DIAGNOSIS — R208 Other disturbances of skin sensation: Secondary | ICD-10-CM | POA: Diagnosis not present

## 2017-07-25 DIAGNOSIS — R2681 Unsteadiness on feet: Secondary | ICD-10-CM

## 2017-07-25 DIAGNOSIS — M6281 Muscle weakness (generalized): Secondary | ICD-10-CM

## 2017-07-25 DIAGNOSIS — R2689 Other abnormalities of gait and mobility: Secondary | ICD-10-CM

## 2017-07-26 ENCOUNTER — Encounter

## 2017-07-26 NOTE — Therapy (Signed)
[facility] [address] [place], [place], [address] Phone: [phone]   Fax:  [phone]  Physical Therapy Treatment  Patient Details  Name: Katie [name] MRN: [id number] Date of Birth: July 18, 1940 Referring Provider: Libby Maw, MD   Encounter Date: 07/25/2017     07/25/17 1325  PT Visits / Re-Eval  Visit Number 20  Number of Visits 25  Date for PT Re-Evaluation 08/07/17  Authorization  Authorization Type Medicare and [employer]  PT Time Calculation  PT Start Time 9242 (pt late for apt today)  PT Stop Time 1400  PT Time Calculation (min) 38 min  PT - End of Session  Equipment Utilized During Treatment Gait belt  Activity Tolerance Patient tolerated treatment well  Behavior During Therapy [facility] for tasks assessed/performed    Past Medical History:  Diagnosis Date  . Arthritis   . Cancer (HCC)    Endometrial  . Glaucoma   . Thyroid disease     Past Surgical History:  Procedure Laterality Date  . ABDOMINAL HYSTERECTOMY    . APPENDECTOMY    . BREAST SURGERY    . CHOLECYSTECTOMY    . FEMUR SURGERY  06/2013    There were no vitals filed for this visit.     07/25/17 1323  Symptoms/Limitations  Subjective No new complaints. Still getting settled into the new apartment so very busy at this time. No falls or pain to report, however does feel more off balance today. She also reports she was able to go up/down 18 stairs with rail/cane combo to visit her neighbor with supervision from neighbor only.   Pertinent History hypothyroidism, vertigo with questionable labyrinthitis, osteoporosis, endometrial cancer with chemo and radiation, peripheral neuropathy, OA, chronic back pain and fall with L hip fracture requiring fixation  Limitations Standing;Walking  How long can you stand comfortably? has to sit on stool in the kitchen  Pain Assessment  Currently in Pain? No/denies  Pain Score 0      07/25/17 1326  Transfers  Transfers Sit to Stand;Stand to Sit  Sit to Stand 7: Independent  Stand to Sit 7: Independent  Ambulation/Gait  Ambulation/Gait Yes  Ambulation/Gait Assistance 4: Min guard;5: Supervision  Ambulation/Gait Assistance Details min guard on gravel/grass, supervision otherwise with use of straight cane.   Ambulation Distance (Feet) 500 Feet  Assistive device Straight cane  Gait Pattern Step-through pattern;Lateral hip instability;Antalgic  Ambulation Surface Level;Indoor;Unlevel;Outdoor;Paved;Gravel;Grass  Curb 6: Modified independent (Device/increase time)  Curb Details (indicate cue type and reason) with cane on outdoor curb, no issues noted. Increased time needed.  Neuro Re-ed   Neuro Re-ed Details  for balance/coordination: standing across red beam- forward stepping to floor/back onto beam x 5 reps each leg, cues on step length, step height and weight shifting.      07/25/17 1336  Balance Exercises: Standing  Rockerboard Anterior/posterior;Lateral;Head turns;EO;EC;30 seconds;10 reps  Balance Exercises: Standing  Rebounder Limitations performed both ways on balance board with no UE support, occasional touch to chair/walls for balance: min guard assist to min assist for rocking board with emphasis on tall posture with EO, progressing to EC; then holding board steady for EC no head movements, progressing to EC head movements left<>right, up<>down. min to mod assist for balance with cues on posture/weight shifting to assist with balance correction.                           PT Short Term Goals -  07/09/17 2016      PT SHORT TERM GOAL #1   Title  Berg Balance >45/56    Baseline  MET 07/09/2017    Status  Achieved    Target Date  07/09/17      PT SHORT TERM GOAL #2   Title  Gait Velocity >2.50 ft/sec    Baseline  MET 07/09/2017  Gait Velocity 2.55 ft/sec    Status  Achieved    Target Date  07/09/17        PT Long Term Goals - 06/19/17 1458      PT LONG  TERM GOAL #1   Title  Pt will demonstrate independence with balance, strengthening HEP    Time  12    Period  Weeks    Status  New    Target Date  08/07/17      PT LONG TERM GOAL #2   Title  Pt will improve BERG to >/= 50/56    Time  12    Period  Weeks    Status  New    Target Date  08/07/17      PT LONG TERM GOAL #3   Title  Pt will improve five time sit to stand </= 13 seconds with UE support    Baseline  11    Time  12    Period  Weeks    Status  Achieved      PT LONG TERM GOAL #4   Title  Pt will ambulate x 200' over paved surfaces and up/down community curb with cane MOD I    Time  12    Period  Weeks    Status  New    Target Date  08/07/17      PT LONG TERM GOAL #5   Title  Pt will report being able to stand in kitchen to fix a meal x 10-15 minutes without using stool    Time  12    Period  Weeks    Status  New    Target Date  08/07/17          07/25/17 1325  Plan  Clinical Impression Statement Today's skilled session continued to focus on gait, activity tolerance and higher level balance activities. Pt is making steady progress toward goals and should benefit from continued PT to progress toward unmet goals.   Pt will benefit from skilled therapeutic intervention in order to improve on the following deficits Abnormal gait;Decreased balance;Decreased strength;Difficulty walking;Dizziness;Increased edema;Impaired sensation;Pain  Rehab Potential Good  PT Frequency 2x / week  PT Duration 12 weeks (will be on hold for a few weeks, return to [place])  PT Treatment/Interventions ADLs/Self Care Home Management;Canalith Repostioning;Moist Heat;Cryotherapy;Electrical Stimulation;DME Instruction;Gait training;Stair training;Functional mobility training;Therapeutic activities;Therapeutic exercise;Balance training;Neuromuscular re-education;Patient/family education;Manual techniques;Passive range of motion;Taping;Vestibular  PT Next Visit Plan Continue SLS, pivoting/changing  direction, multi-sensory balance training on balance beam/compliant surfaces, progress corner balance, strengthening of core/LE's (avoid extension due to stenosis/disc bulging)  Consulted and Agree with Plan of Care Patient         Patient will benefit from skilled therapeutic intervention in order to improve the following deficits and impairments:  Abnormal gait, Decreased balance, Decreased strength, Difficulty walking, Dizziness, Increased edema, Impaired sensation, Pain  Visit Diagnosis: Unsteadiness on feet  Other abnormalities of gait and mobility  Muscle weakness (generalized)  Other disturbances of skin sensation     Problem List Patient Active Problem List   Diagnosis Date Noted  . Acute pain of right  knee 06/03/2017  . Loose stools 05/30/2017  . Chronic diarrhea 05/30/2017  . 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 Osteoarthritis of right knee 05/27/2017  . Labyrinthitis 04/18/2017  . Need for assistance due to unsteady gait 04/18/2017  . Age-related osteoporosis without current pathological fracture 01/23/2017  . Acquired hypothyroidism 01/23/2017  . History of endometrial cancer 01/23/2017    Willow Ora, PTA, [facility] [address] [place], [place] [address] [phone] 07/27/17, 10:49 AM   Name: Jaynia Fendley MRN: [id number] Date of Birth: [birth year]

## 2017-07-29 ENCOUNTER — Encounter: Payer: Self-pay | Admitting: Rehabilitation

## 2017-07-29 ENCOUNTER — Ambulatory Visit: Payer: Medicare Other | Admitting: Rehabilitation

## 2017-07-29 DIAGNOSIS — R296 Repeated falls: Secondary | ICD-10-CM | POA: Diagnosis not present

## 2017-07-29 DIAGNOSIS — R2689 Other abnormalities of gait and mobility: Secondary | ICD-10-CM | POA: Diagnosis not present

## 2017-07-29 DIAGNOSIS — R208 Other disturbances of skin sensation: Secondary | ICD-10-CM

## 2017-07-29 DIAGNOSIS — M6281 Muscle weakness (generalized): Secondary | ICD-10-CM

## 2017-07-29 DIAGNOSIS — R2681 Unsteadiness on feet: Secondary | ICD-10-CM

## 2017-07-29 NOTE — Therapy (Signed)
[facility] [address] [place], [place], [address] Phone: [phone]   Fax:  [phone]  Physical Therapy Treatment  Patient Details  Name: Katie [name] MRN: [id number] Date of Birth: [birth year] Referring Provider: Libby Maw, MD   Encounter Date: 07/29/2017  PT End of Session - 07/29/17 1111    Visit Number  21    Number of Visits  25    Date for PT Re-Evaluation  08/07/17    Authorization Type  Medicare and [place]    PT Start Time  1104    PT Stop Time  1145    PT Time Calculation (min)  41 min    Equipment Utilized During Treatment  Gait belt    Activity Tolerance  Patient tolerated treatment well    Behavior During Therapy  WFL for tasks assessed/performed       Past Medical History:  Diagnosis Date  . Arthritis   . Cancer (HCC)    Endometrial  . Glaucoma   . Thyroid disease     Past Surgical History:  Procedure Laterality Date  . ABDOMINAL HYSTERECTOMY    . APPENDECTOMY    . BREAST SURGERY    . CHOLECYSTECTOMY    . FEMUR SURGERY  06/2013    There were no vitals filed for this visit.  Subjective Assessment - 07/29/17 1109    Subjective  Pt reports no changes, no falls.     Pertinent History  hypothyroidism, vertigo with questionable labyrinthitis, osteoporosis, endometrial cancer with chemo and radiation, peripheral neuropathy, OA, chronic back pain and fall with L hip fracture requiring fixation    Limitations  Standing;Walking    How long can you stand comfortably?  has to sit on stool in the kitchen    Patient Stated Goals  get a little stronger and decrease back pain    Currently in Pain?  Yes    Pain Score  6     Pain Location  Back    Pain Orientation  Mid;Lower    Pain Descriptors / Indicators  Aching;Dull    Pain Type  Chronic pain    Pain Onset  More than a month ago    Pain Frequency  Intermittent    Aggravating Factors   doing anything     Pain Relieving  Factors  rest, Tylenol                       [facility] Adult PT Treatment/Exercise - 07/29/17 1126      Ambulation/Gait   Ambulation/Gait  Yes    Ambulation/Gait Assistance  5: Supervision;4: Min guard    Ambulation/Gait Assistance Details  Pt with one instance of mild LOB however she was able to self correct.  Also performed uphill/downhill on grassy surfaces as she reports she has difficulty descending hillls due to knee issues.  Provided cues for slower more controlled stepping when descending small hill. Also recommended she wear knee brace if performing hill and also having close S for safety.  Pt verbalized understanding.  Pt S to mod I on paved surfaces, closer S on grassy surfaces (one instance of min/guard needed as mentioned).  Cues for posture.     Ambulation Distance (Feet)  600 Feet    Assistive device  Straight cane    Gait Pattern  Step-through pattern;Lateral hip instability;Antalgic    Ambulation Surface  Level;Indoor;Outdoor;Unlevel;Paved;Gravel;Grass    Ramp  6: Modified independent (Device) with cane  Curb  6: Modified independent (Device/increase time)    Curb Details (indicate cue type and reason)  with cane      High Level Balance   High Level Balance Comments  Continue to work on high level balance/gait with obstacle course in which pt is having to step up 2" step, tap cones, stepping over barriers, and up to 8" step with SPC.  Performed 30' x 2 reps in this manner and then varried having her side step over obstances, around obstacles with use of cane.  min/guard to close S for all tasks esp larger curb step.  Cues for safe stepping technique during turn instead of pivoting on foot.               PT Education - 07/29/17 1111    Education provided  Yes    Education Details  safety with turning, ambulation up/hill and down/hill    Person(s) Educated  Patient    Methods  Explanation    Comprehension  Verbalized understanding       PT Short Term  Goals - 07/09/17 2016      PT SHORT TERM GOAL #1   Title  Berg Balance >45/56    Baseline  MET 07/09/2017    Status  Achieved    Target Date  07/09/17      PT SHORT TERM GOAL #2   Title  Gait Velocity >2.50 ft/sec    Baseline  MET 07/09/2017  Gait Velocity 2.55 ft/sec    Status  Achieved    Target Date  07/09/17        PT Long Term Goals - 06/19/17 1458      PT LONG TERM GOAL #1   Title  Pt will demonstrate independence with balance, strengthening HEP    Time  12    Period  Weeks    Status  New    Target Date  08/07/17      PT LONG TERM GOAL #2   Title  Pt will improve BERG to >/= 50/56    Time  12    Period  Weeks    Status  New    Target Date  08/07/17      PT LONG TERM GOAL #3   Title  Pt will improve five time sit to stand </= 13 seconds with UE support    Baseline  11    Time  12    Period  Weeks    Status  Achieved      PT LONG TERM GOAL #4   Title  Pt will ambulate x 200' over paved surfaces and up/down community curb with cane MOD I    Time  12    Period  Weeks    Status  New    Target Date  08/07/17      PT LONG TERM GOAL #5   Title  Pt will report being able to stand in kitchen to fix a meal x 10-15 minutes without using stool    Time  12    Period  Weeks    Status  New    Target Date  08/07/17            Plan - 07/29/17 1309    Clinical Impression Statement  Skilled session focused on outdoor gait with SPC (esp grass and uphill/downhill) as well as obstacles for high level balance and making safe turns/pivoting with use of cane.      Rehab Potential  Good  PT Frequency  2x / week    PT Duration  12 weeks will be on hold for a few weeks, return to [place]    PT Treatment/Interventions  ADLs/Self Care Home Management;Canalith Repostioning;Moist Heat;Cryotherapy;Electrical Stimulation;DME Instruction;Gait training;Stair training;Functional mobility training;Therapeutic activities;Therapeutic exercise;Balance training;Neuromuscular  re-education;Patient/family education;Manual techniques;Passive range of motion;Taping;Vestibular    PT Next Visit Plan  work towards [place].     Consulted and Agree with Plan of Care  Patient       Patient will benefit from skilled therapeutic intervention in order to improve the following deficits and impairments:  Abnormal gait, Decreased balance, Decreased strength, Difficulty walking, Dizziness, Increased edema, Impaired sensation, Pain  Visit Diagnosis: Unsteadiness on feet  Other abnormalities of gait and mobility  Muscle weakness (generalized)  Other disturbances of skin sensation  Repeated falls     Problem List Patient Active Problem List   Diagnosis Date Noted  . Acute pain of right knee 06/03/2017  . Loose stools 05/30/2017  . Chronic diarrhea 05/30/2017  . 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 Osteoarthritis of right knee 05/27/2017  . Labyrinthitis 04/18/2017  . Need for assistance due to unsteady gait 04/18/2017  . Age-related osteoporosis without current pathological fracture 01/23/2017  . Acquired hypothyroidism 01/23/2017  . History of endometrial cancer 01/23/2017   Cameron Sprang, PT, MPT [facility] [address] [place], [place], [address] Phone: [phone]   Fax:  [phone] 07/29/17, 1:13 PM  Name: Ayme Short MRN: [id number] Date of Birth: April 06, 1940

## 2017-08-02 ENCOUNTER — Ambulatory Visit: Payer: Medicare Other | Admitting: Physical Therapy

## 2017-08-02 ENCOUNTER — Encounter: Payer: Self-pay | Admitting: Physical Therapy

## 2017-08-02 DIAGNOSIS — R2689 Other abnormalities of gait and mobility: Secondary | ICD-10-CM

## 2017-08-02 DIAGNOSIS — R208 Other disturbances of skin sensation: Secondary | ICD-10-CM | POA: Diagnosis not present

## 2017-08-02 DIAGNOSIS — M6281 Muscle weakness (generalized): Secondary | ICD-10-CM | POA: Diagnosis not present

## 2017-08-02 DIAGNOSIS — R2681 Unsteadiness on feet: Secondary | ICD-10-CM | POA: Diagnosis not present

## 2017-08-02 DIAGNOSIS — R296 Repeated falls: Secondary | ICD-10-CM | POA: Diagnosis not present

## 2017-08-02 NOTE — Therapy (Signed)
[facility] [address] [place], [place], [address] Phone: [phone]   Fax:  [phone]  Physical Therapy Treatment  Patient Details  Name: Katie [name] MRN: [id number] Date of Birth: 1940-11-14 Referring Provider: Libby Maw, MD   Encounter Date: 08/02/2017  PT End of Session - 08/02/17 1403    Visit Number  22    Number of Visits  25    Date for PT Re-Evaluation  08/07/17    Authorization Type  Medicare and [place]    PT Start Time  1319    PT Stop Time  1404    PT Time Calculation (min)  45 min    Activity Tolerance  Patient tolerated treatment well    Behavior During Therapy  [facility] for tasks assessed/performed       Past Medical History:  Diagnosis Date  . Arthritis   . Cancer (HCC)    Endometrial  . Glaucoma   . Thyroid disease     Past Surgical History:  Procedure Laterality Date  . ABDOMINAL HYSTERECTOMY    . APPENDECTOMY    . BREAST SURGERY    . CHOLECYSTECTOMY    . FEMUR SURGERY  06/2013    There were no vitals filed for this visit.  Subjective Assessment - 08/02/17 1323    Subjective  Moved to apartment across the hall, likes it much better but it is a reverse floor plan and gets left and right confused.    Pertinent History  hypothyroidism, vertigo with questionable labyrinthitis, osteoporosis, endometrial cancer with chemo and radiation, peripheral neuropathy, OA, chronic back pain and fall with L hip fracture requiring fixation    Limitations  Standing;Walking    How long can you stand comfortably?  has to sit on stool in the kitchen    Patient Stated Goals  get a little stronger and decrease back pain    Currently in Pain?  Yes    Pain Score  5     Pain Location  Back    Pain Orientation  Mid;Lower    Pain Descriptors / Indicators  Sore    Pain Onset  More than a month ago         [facility] PT Assessment - 08/02/17 1334      Ambulation/Gait   Ambulation/Gait   Yes    Ambulation/Gait Assistance  6: Modified independent (Device/Increase time)    Ambulation/Gait Assistance Details  outdoors over paved sidewalk and black top pavement     Ambulation Distance (Feet)  400 Feet    Assistive device  Straight cane    Gait Pattern  Step-through pattern;Lateral hip instability;Antalgic    Ambulation Surface  Unlevel;Outdoor;Paved    Curb  6: Modified independent (Device/increase time)    Curb Details (indicate cue type and reason)  with cane x 2 reps up/down      Standardized Balance Assessment   Standardized Balance Assessment  Berg Balance Test;Five Times Sit to Stand    Five times sit to stand comments   14 seconds without use of UE.  Met goal previously with 11 seconds five time sit to stand      [employer]   Sit to Stand  Able to stand without using hands and stabilize independently    Standing Unsupported  Able to stand safely 2 minutes    Sitting with Back Unsupported but Feet Supported on Floor or Stool  Able to sit safely and securely 2 minutes  Stand to Sit  Sits safely with minimal use of hands    Transfers  Able to transfer safely, minor use of hands    Standing Unsupported with Eyes Closed  Able to stand 10 seconds with supervision    Standing Ubsupported with Feet Together  Able to place feet together independently and stand 1 minute safely    From Standing, Reach Forward with Outstretched Arm  Can reach confidently >25 cm (10")    From Standing Position, Pick up Object from [place] to pick up shoe safely and easily    From Standing Position, Turn to Look Behind Over each Shoulder  Looks behind from both sides and weight shifts well    Turn 360 Degrees  Able to turn 360 degrees safely one side only in 4 seconds or less    Standing Unsupported, Alternately Place Feet on Step/Stool  Able to complete 4 steps without aid or supervision    Standing Unsupported, One Foot in Front  Able to plae foot ahead of the other independently and  hold 30 seconds    Standing on One Leg  Tries to lift leg/unable to hold 3 seconds but remains standing independently    Total Score  48    Berg comment:  48/56                           PT Education - 08/02/17 1402    Education provided  Yes    Education Details  progress towards goals; plan to D/C after next visit    Person(s) Educated  Patient    Methods  Explanation    Comprehension  Verbalized understanding       PT Short Term Goals - 07/09/17 2016      PT SHORT TERM GOAL #1   Title  Berg Balance >45/56    Baseline  MET 07/09/2017    Status  Achieved    Target Date  07/09/17      PT SHORT TERM GOAL #2   Title  Gait Velocity >2.50 ft/sec    Baseline  MET 07/09/2017  Gait Velocity 2.55 ft/sec    Status  Achieved    Target Date  07/09/17        PT Long Term Goals - 08/02/17 1326      PT LONG TERM GOAL #1   Title  Pt will demonstrate independence with balance, strengthening HEP    Time  12    Period  Weeks    Status  New      PT LONG TERM GOAL #2   Title  Pt will improve BERG to >/= 50/56    Baseline  48/56 improved but not to goal level    Time  12    Period  Weeks    Status  Partially Met      PT LONG TERM GOAL #3   Title  Pt will improve five time sit to stand </= 13 seconds with UE support    Baseline  11    Time  12    Period  Weeks    Status  Achieved      PT LONG TERM GOAL #4   Title  Pt will ambulate x 200' over paved surfaces and up/down community curb with cane MOD I    Baseline  400' over paved sidewalk/inclines and curb with cane MOD I    Time  12    Period  Weeks  Status  Achieved      PT LONG TERM GOAL #5   Title  Pt will report being able to stand in kitchen to fix a meal x 10-15 minutes without using stool    Baseline  can stand for 60 minutes in the kitchen now    Time  12    Period  Weeks    Status  Achieved            Plan - 08/02/17 1400    Clinical Impression Statement  Due to patient's significant  progress, initiated assessement of LTG.  Pt has met 3/5 LTG and partially met BERG goal; goal #1 to be assessed at next visit.  Pt demonstrated improvement in standing balance ongoing impairments are attributed to neuropathy and knee OA pain and instability.  Pt demonstrates improved LE functional strength and improved safety with gait with cane, improved posture and decreased antalgic gait when not wearing knee brace.  Pt requires knee brace less for standing and ambulation and pt can tolerate standing for >60 minutes without needing a seated rest break.  Plan is to finalize HEP at next visit and D/C.  Pt does not currently have options for community wellness classes but is willing to continue with HEP at D/C.    Rehab Potential  Good    PT Frequency  2x / week    PT Duration  12 weeks will be on hold for a few weeks, return to [place]    PT Treatment/Interventions  ADLs/Self Care Home Management;Canalith Repostioning;Moist Heat;Cryotherapy;Electrical Stimulation;DME Instruction;Gait training;Stair training;Functional mobility training;Therapeutic activities;Therapeutic exercise;Balance training;Neuromuscular re-education;Patient/family education;Manual techniques;Passive range of motion;Taping;Vestibular    PT Next Visit Plan  finish LTG #1: finalize HEP: UE and LE strength, balance!!  D/C after Monday, cancel Tuesday appt.    Consulted and Agree with Plan of Care  Patient       Patient will benefit from skilled therapeutic intervention in order to improve the following deficits and impairments:  Abnormal gait, Decreased balance, Decreased strength, Difficulty walking, Dizziness, Increased edema, Impaired sensation, Pain  Visit Diagnosis: Unsteadiness on feet  Other abnormalities of gait and mobility  Muscle weakness (generalized)  Other disturbances of skin sensation  Repeated falls     Problem List Patient Active Problem List   Diagnosis Date Noted  . Acute pain of right knee 06/03/2017   . Loose stools 05/30/2017  . Chronic diarrhea 05/30/2017  . 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 Osteoarthritis of right knee 05/27/2017  . Labyrinthitis 04/18/2017  . Need for assistance due to unsteady gait 04/18/2017  . Age-related osteoporosis without current pathological fracture 01/23/2017  . Acquired hypothyroidism 01/23/2017  . History of endometrial cancer 01/23/2017   Rico Junker, PT, DPT 08/02/17    2:10 PM    [facility] [address] [place], [place], [address] Phone: [phone]   Fax:  [phone]  Name: Katie [name] MRN: [id number] Date of Birth: 04-May-1940

## 2017-08-05 ENCOUNTER — Ambulatory Visit: Payer: Medicare Other | Attending: Family Medicine | Admitting: Physical Therapy

## 2017-08-05 ENCOUNTER — Encounter: Payer: Self-pay | Admitting: Physical Therapy

## 2017-08-05 DIAGNOSIS — M6281 Muscle weakness (generalized): Secondary | ICD-10-CM | POA: Insufficient documentation

## 2017-08-05 DIAGNOSIS — R2681 Unsteadiness on feet: Secondary | ICD-10-CM

## 2017-08-05 DIAGNOSIS — R2689 Other abnormalities of gait and mobility: Secondary | ICD-10-CM | POA: Diagnosis not present

## 2017-08-05 DIAGNOSIS — R208 Other disturbances of skin sensation: Secondary | ICD-10-CM | POA: Diagnosis not present

## 2017-08-05 NOTE — Patient Instructions (Signed)
Trunk Rotation    Stand with both hands touching the counter.  Twist trunk and hips to one side reaching back with one hand. Slowly return to starting position. Twist to other side reaching with other hand. Repeat __5__ times, each side.  Fitness Plan has 4 components.  1. Endurance - Recommend machines that are sitting with back support that uses both your arms and your legs.Or can do walking program Goal is 20-30 minutes. 2. Strength - weight machines enough weight to have resistance but not so much that you have to strain or cheat.  Goal is to do 15 repetitions for 2-3 sets. Rest 30-60 seconds between sets.  Do 2-4 leg machines, 2-4 arm machines & 2-4 trunk machines. Look for pictures to make sure you exercise both sides of arm, leg or trunk.  Leg machines like leg press (can do both legs or each leg by themselves),   At home can do theraband exercises for arms or legs, floor transfers, sit to stand to sit using arms as little as possible, Yoga positions 3.Flexibility - make sure you include arms, legs & trunk. Can do Yoga also 4. Balance- can work in corner with chair in front - head turns, arm motions, eyes closed; place one foot inside cabinet or on 4-6" block to work on one-legged stance,   Try to get to each component 3-5 times per week.  Going to [facility] or fitness center you want do things you can not do at home.  For example use bikes at [facility] if you don't have one at home. Then on days you don't go to [facility], do exercises at home.  At least 150 minutes a week of moderate intensity exercise. Try not to go more than 2 days in a row without being active for at least 30 minutes a day. Any activity where you are up and moving is good- Walking, bicycling, stationary bicycling, dancing.  (moderate intensity means to get  a little out of breath)   Do resistance exercise at least 2 times a week.  This can be yoga poses or strength training where you lift your own weight (think leg lifts or toe  raises)  or light weights like cans of beans with your arms.    Flexibility and balance exercise- safe stretching and practicing balance is very important to health.

## 2017-08-06 ENCOUNTER — Ambulatory Visit: Payer: Medicare Other | Admitting: Physical Therapy

## 2017-08-06 NOTE — Therapy (Signed)
[name] [address] [place], [place], [address] Phone: [phone]   Fax:  [phone]  Physical Therapy Treatment  Patient Details  Name: Katie [name] MRN: [id number] Date of Birth: 26-Dec-1940 Referring Provider: Libby Maw, MD   Encounter Date: 08/05/2017  PT End of Session - 08/05/17 1955    Visit Number  23    Number of Visits  25    Date for PT Re-Evaluation  08/07/17    Authorization Type  Medicare and [employer]    PT Start Time  1447    PT Stop Time  1530    PT Time Calculation (min)  43 min    Activity Tolerance  Patient tolerated treatment well    Behavior During Therapy  [facility] for tasks assessed/performed       Past Medical History:  Diagnosis Date  . Arthritis   . Cancer (HCC)    Endometrial  . Glaucoma   . Thyroid disease     Past Surgical History:  Procedure Laterality Date  . ABDOMINAL HYSTERECTOMY    . APPENDECTOMY    . BREAST SURGERY    . CHOLECYSTECTOMY    . FEMUR SURGERY  06/2013    There were no vitals filed for this visit.  Subjective Assessment - 08/05/17 1445    Subjective  Yesterday was an off day.     Pertinent History  hypothyroidism, vertigo with questionable labyrinthitis, osteoporosis, endometrial cancer with chemo and radiation, peripheral neuropathy, OA, chronic back pain and fall with L hip fracture requiring fixation    Limitations  Standing;Walking    How long can you stand comfortably?  has to sit on stool in the kitchen    Patient Stated Goals  get a little stronger and decrease back pain    Currently in Pain?  Yes    Pain Score  3     Pain Location  Back    Pain Orientation  Mid;Lower    Pain Descriptors / Indicators  Sore    Pain Onset  More than a month ago    Pain Frequency  Intermittent    Aggravating Factors   doing anything    Pain Relieving Factors  rest, tylenol      Therapeutic Exercise: PT instructed in well round fitness plan  includes exercises to address endurance, flexibility, strength & balance. PT recommended dividing exercises into groups by location: bed, chair, corner, countertop, gym. Then set half of exercises for Monday, Wednesday & Friday and other half Tuesday, Thursday & Saturday.  PT reviewed exercises issued during current episode of care. Pt verbalized understanding.  Pt brought exercises from previous therapies to go determine if she should continue. UE program including shoulder flexion, abduction, internal & external rotation, extension and adduction, elbow flexion, wrist extension, pronation / supination, finger extension & abduction. Pt verbalized better understanding after PT review. She also brought hip exercises: hip extension, hip abduction, knee flexion. PT recommended adding to exercises instructed during current episode of care.  Pt verbalized and return demo understanding of ongoing HEP / fitness plan.                            PT Short Term Goals - 07/09/17 2016      PT SHORT TERM GOAL #1   Title  Berg Balance >45/56    Baseline  MET 07/09/2017    Status  Achieved    Target Date  07/09/17      PT SHORT TERM GOAL #2   Title  Gait Velocity >2.50 ft/sec    Baseline  MET 07/09/2017  Gait Velocity 2.55 ft/sec    Status  Achieved    Target Date  07/09/17        PT Long Term Goals - 08/05/17 1957      PT LONG TERM GOAL #1   Title  Pt will demonstrate independence with balance, strengthening HEP    Baseline  MET 08/05/2017    Time  12    Period  Weeks    Status  Achieved      PT LONG TERM GOAL #2   Title  Pt will improve BERG to >/= 50/56    Baseline  48/56 improved but not to goal level    Time  12    Period  Weeks    Status  Partially Met      PT LONG TERM GOAL #3   Title  Pt will improve five time sit to stand </= 13 seconds with UE support    Baseline  11    Time  12    Period  Weeks    Status  Achieved      PT LONG TERM GOAL #4   Title  Pt will  ambulate x 200' over paved surfaces and up/down community curb with cane MOD I    Baseline  400' over paved sidewalk/inclines and curb with cane MOD I    Time  12    Period  Weeks    Status  Achieved      PT LONG TERM GOAL #5   Title  Pt will report being able to stand in kitchen to fix a meal x 10-15 minutes without using stool    Baseline  can stand for 60 minutes in the kitchen now    Time  12    Period  Weeks    Status  Achieved            Plan - 08/05/17 1957    Clinical Impression Statement  Patient met or partially met all LTGs. Her balance and gait has improved to level with low fall risk. Patient reports better understanding of ongoing HEP / fitness plan after today's session focusing on exercise program.     Rehab Potential  Good    PT Frequency  2x / week    PT Duration  12 weeks will be on hold for a few weeks, return to [place]    PT Treatment/Interventions  ADLs/Self Care Home Management;Canalith Repostioning;Moist Heat;Cryotherapy;Electrical Stimulation;DME Instruction;Gait training;Stair training;Functional mobility training;Therapeutic activities;Therapeutic exercise;Balance training;Neuromuscular re-education;Patient/family education;Manual techniques;Passive range of motion;Taping;Vestibular    PT Next Visit Plan  discharge PT    Consulted and Agree with Plan of Care  Patient       Patient will benefit from skilled therapeutic intervention in order to improve the following deficits and impairments:  Abnormal gait, Decreased balance, Decreased strength, Difficulty walking, Dizziness, Increased edema, Impaired sensation, Pain  Visit Diagnosis: Other abnormalities of gait and mobility  Unsteadiness on feet  Muscle weakness (generalized)  Other disturbances of skin sensation     Problem List Patient Active Problem List   Diagnosis Date Noted  . Acute pain of right knee 06/03/2017  . Loose stools 05/30/2017  . Chronic diarrhea 05/30/2017  . Colitis due to  radiation 05/27/2017  . Osteoarthritis of right knee 05/27/2017  . Labyrinthitis 04/18/2017  . Need for assistance due to  unsteady gait 04/18/2017  . Age-related osteoporosis without current pathological fracture 01/23/2017  . Acquired hypothyroidism 01/23/2017  . History of endometrial cancer 01/23/2017      PHYSICAL THERAPY DISCHARGE SUMMARY  Visits from Start of Care: 23  Current functional level related to goals / functional outcomes: See above   Remaining deficits: Berg Balance 48/56 indicates moderate fall risk.    Education / Equipment: Ongoing HEP / fitness plan. Plan: Patient agrees to discharge.  Patient goals were partially met. Patient is being discharged due to meeting the stated rehab goals.  ?????          WALDRON,ROBIN PT, DPT 08/06/2017, 8:00 PM  [facility] [address] [place], [place], [address] Phone: [phone]   Fax:  [phone]  Name: Katie [name] MRN: [id number] Date of Birth: 12/22/1940

## 2017-08-21 ENCOUNTER — Encounter: Payer: Self-pay | Admitting: Podiatry

## 2017-08-21 ENCOUNTER — Ambulatory Visit (INDEPENDENT_AMBULATORY_CARE_PROVIDER_SITE_OTHER): Payer: Medicare Other | Admitting: Podiatry

## 2017-08-21 DIAGNOSIS — M79675 Pain in left toe(s): Secondary | ICD-10-CM | POA: Diagnosis not present

## 2017-08-21 DIAGNOSIS — M79674 Pain in right toe(s): Secondary | ICD-10-CM | POA: Diagnosis not present

## 2017-08-21 DIAGNOSIS — B351 Tinea unguium: Secondary | ICD-10-CM | POA: Diagnosis not present

## 2017-08-21 NOTE — Progress Notes (Signed)
Complaint:  Visit Type: Patient returns to my office for continued preventative foot care services. Complaint: Patient states" my nails have grown long and thick and become painful to walk and wear shoes" Patient has been diagnosed with neuropathy due to chemotherapy. The patient presents for preventative foot care services. No changes to ROS  Podiatric Exam: Vascular: dorsalis pedis and posterior tibial pulses are palpable bilateral. Capillary return is immediate. Temperature gradient is WNL. Skin turgor WNL  Sensorium: Normal Semmes Weinstein monofilament test. Normal tactile sensation bilaterally. Nail Exam: Pt has thick disfigured discolored nails with subungual debris noted bilateral entire nail hallux through fifth toenails Ulcer Exam: There is no evidence of ulcer or pre-ulcerative changes or infection. Orthopedic Exam: Muscle tone and strength are WNL. No limitations in general ROM. No crepitus or effusions noted. Foot type and digits show no abnormalities. HAV 1st MPJ left foot.  Skin: No Porokeratosis. No infection or ulcers  Diagnosis:  Onychomycosis, , Pain in right toe, pain in left toes  Treatment & Plan Procedures and Treatment: Consent by patient was obtained for treatment procedures.   Debridement of mycotic and hypertrophic toenails, 1 through 5 bilateral and clearing of subungual debris. No ulceration, no infection noted.  Return Visit-Office Procedure: Patient instructed to return to the office for a follow up visit 3 months for continued evaluation and treatment.    Gardiner Barefoot DPM

## 2017-08-26 ENCOUNTER — Ambulatory Visit (INDEPENDENT_AMBULATORY_CARE_PROVIDER_SITE_OTHER): Payer: Medicare Other | Admitting: Family Medicine

## 2017-08-26 ENCOUNTER — Encounter: Payer: Self-pay | Admitting: Family Medicine

## 2017-08-26 VITALS — BP 130/70 | HR 75 | Ht 60.0 in | Wt 144.5 lb

## 2017-08-26 DIAGNOSIS — E039 Hypothyroidism, unspecified: Secondary | ICD-10-CM

## 2017-08-26 DIAGNOSIS — R109 Unspecified abdominal pain: Secondary | ICD-10-CM | POA: Diagnosis not present

## 2017-08-26 LAB — URINALYSIS, ROUTINE W REFLEX MICROSCOPIC
Bilirubin Urine: NEGATIVE
Hgb urine dipstick: NEGATIVE
Ketones, ur: NEGATIVE
Leukocytes, UA: NEGATIVE
Nitrite: NEGATIVE
RBC / HPF: NONE SEEN (ref 0–?)
Specific Gravity, Urine: 1.02 (ref 1.000–1.030)
Total Protein, Urine: NEGATIVE
Urine Glucose: NEGATIVE
Urobilinogen, UA: 0.2 (ref 0.0–1.0)
pH: 5.5 (ref 5.0–8.0)

## 2017-08-26 LAB — CBC
HCT: 37.4 % (ref 36.0–46.0)
Hemoglobin: 12.6 g/dL (ref 12.0–15.0)
MCHC: 33.6 g/dL (ref 30.0–36.0)
MCV: 91.3 fl (ref 78.0–100.0)
Platelets: 315 10*3/uL (ref 150.0–400.0)
RBC: 4.1 Mil/uL (ref 3.87–5.11)
RDW: 14.1 % (ref 11.5–15.5)
WBC: 6.2 10*3/uL (ref 4.0–10.5)

## 2017-08-26 LAB — TSH: TSH: 1.59 u[IU]/mL (ref 0.35–4.50)

## 2017-08-26 MED ORDER — LEVOTHYROXINE SODIUM 75 MCG PO TABS: 75.0000 ug | ORAL_TABLET | Freq: Every day | ORAL | 1 refills | Status: DC

## 2017-08-26 NOTE — Progress Notes (Addendum)
Subjective:  Patient ID: Katie [name], female    DOB: [birth year]  Age: 77 y.o. MRN: [id number]  CC: Follow-up   HPI [name] presents for evaluation of discomfort in her left flank area.  Discomfort grabs her time and is relieved when she stretches through her lower back in that same area.  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  Recent CT scan of this area was essentially negative.  She started her higher dose Synthroid immediately after it was written for.  She denies weight gain or cold sensitivity.  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  She is not due for follow-up DEXA scan until January.  She has been taking Os-Cal with vitamin D.  Outpatient Medications Prior to Visit  Medication Sig Dispense Refill  . alendronate (FOSAMAX) 70 MG tablet Take 1 tablet (70 mg total) by mouth every 7 (seven) days. Take with a full glass of water on an empty stomach. 4 tablet 11  . bimatoprost (LUMIGAN) 0.01 % SOLN 1 drop at bedtime.    Marland Kitchen BIOTIN PO Take 1 tablet by mouth daily.    . brimonidine (ALPHAGAN P) 0.1 % SOLN Use in the morning and evening.    . Calcium Citrate-Vitamin D (CALCIUM CITRATE + D PO) Take by mouth 2 (two) times daily.    . cholestyramine (QUESTRAN) 4 g packet Take 1 packet (4 g total) by mouth 2 (two) times daily. 60 each 2  . esomeprazole (NEXIUM) 20 MG capsule Take 20 mg by mouth daily at 12 noon.    . pyridOXINE (VITAMIN B-6) 100 MG tablet Take 100 mg by mouth daily.    Marland Kitchen ketorolac (TORADOL) 10 MG tablet Take 1 tablet (10 mg total) by mouth every 6 (six) hours as needed. 20 tablet 0  . levothyroxine (SYNTHROID, LEVOTHROID) 75 MCG tablet Take 1 tablet (75 mcg total) by mouth daily. 90 tablet 1   No facility-administered medications prior to visit.     ROS Review of Systems  Constitutional:  Negative for chills, fatigue, fever and unexpected weight change.  HENT: Negative.   Eyes: Negative.   Respiratory: Negative.  Negative for chest tightness and shortness of breath.   Cardiovascular: Negative for chest pain and palpitations.  Gastrointestinal: Negative for abdominal pain, blood in stool, constipation and diarrhea.  Endocrine: Negative for cold intolerance and heat intolerance.  Genitourinary: Negative.  Negative for decreased urine volume and frequency.  Musculoskeletal: Positive for arthralgias and gait problem.  Skin: Negative for pallor.  Allergic/Immunologic: Negative for immunocompromised state.  Neurological: Negative for weakness and headaches.  Hematological: Does not bruise/bleed easily.  Psychiatric/Behavioral: Negative.     Objective:  BP 130/70   Pulse 75   Ht 5' (1.524 m)   Wt 144 lb 8 oz (65.5 kg)   SpO2 99%   BMI 28.22 kg/m   BP Readings from Last 3 Encounters:  08/26/17 130/70  07/04/17 128/80  06/03/17 128/64    Wt Readings from Last 3 Encounters:  08/26/17 144 lb 8 oz (65.5 kg)  07/04/17 143 lb 4 oz (65 kg)  06/03/17 145 lb (65.8 kg)    Physical Exam  Constitutional: She is oriented to person, place, and time. She appears well-developed and well-nourished. No distress.  HENT:  Head: Normocephalic and atraumatic.  Right Ear: External ear normal.  Left Ear:  External ear normal.  Mouth/Throat: Oropharynx is clear and moist. No oropharyngeal exudate.  Eyes: Pupils are equal, round, and reactive to light. Conjunctivae and EOM are normal. Right eye exhibits no discharge. Left eye exhibits no discharge. No scleral icterus.  Neck: No JVD present. No tracheal deviation present. No thyromegaly present.  Cardiovascular: Normal rate, regular rhythm and normal heart sounds.  Pulmonary/Chest: Effort normal and breath sounds normal. No stridor. No respiratory distress. She has no wheezes. She has no rales.  Abdominal: Soft. Bowel sounds are normal.  She exhibits no distension and no mass. There is no tenderness. There is no rebound and no guarding. No hernia.  Musculoskeletal:       Lumbar back: She exhibits normal range of motion, no tenderness and no bony tenderness.       Back:  Neurological: She is alert and oriented to person, place, and time. She has normal strength.  Skin: Skin is warm and dry. Capillary refill takes less than 2 seconds. She is not diaphoretic.  Psychiatric: She has a normal mood and affect. Her behavior is normal.    Lab Results  Component Value Date   WBC 6.2 08/26/2017   HGB 12.6 08/26/2017   HCT 37.4 08/26/2017   PLT 315.0 08/26/2017   GLUCOSE 96 07/04/2017   NA 134 (L) 07/04/2017   K 4.8 07/04/2017   CL 100 07/04/2017   CREATININE 0.84 07/04/2017   BUN 15 07/04/2017   CO2 27 07/04/2017   TSH 1.59 08/26/2017    Ct Abdomen Pelvis W Contrast  Result Date: 07/11/2017 CLINICAL DATA:  Abdominal pain, LEFT flank pain and LEFT upper quadrant pain for 2 weeks question renal colic, history of endometrial cancer EXAM: CT ABDOMEN AND PELVIS WITH CONTRAST TECHNIQUE: Multidetector CT imaging of the abdomen and pelvis was performed using the standard protocol following bolus administration of intravenous contrast. Sagittal and coronal MPR images reconstructed from axial data set. Exam degraded by motion artifacts. CONTRAST:  123mL ISOVUE-300 IOPAMIDOL (ISOVUE-300) INJECTION 61% IV. Dilute oral contrast. COMPARISON:  None FINDINGS: Lower chest: Lung bases clear Hepatobiliary: Gallbladder surgically absent.  Liver unremarkable. Pancreas: Normal appearance Spleen: Normal appearance Adrenals/Urinary Tract: Adrenal glands normal appearance. Small RIGHT renal cyst. Kidneys, ureters, and bladder otherwise normal appearance. Stomach/Bowel: Stomach decompressed with small hiatal hernia noted. Prominent duodenal diverticulum. Appendix surgically absent by history. Few uncomplicated sigmoid diverticula. Bowel loops otherwise normal  appearance. Vascular/Lymphatic: Atherosclerotic calcifications aorta without aneurysm. Aorta normal caliber. Single upper normal sized lymph node at RIGHT inguinal canal 11 mm short axis image 59. Reproductive: Uterus surgically absent. Nonvisualization of ovaries. Other: Small RIGHT inguinal hernia containing fat. No free air or free fluid. No definite inflammatory process. Musculoskeletal: Dextroconvex lumbar scoliosis. Osseous demineralization with scattered degenerative disc and facet disease changes. Orthopedic hardware proximal LEFT femur. IMPRESSION: Minimal sigmoid diverticulosis without evidence of diverticulitis. No urinary tract calcification or dilatation. Small RIGHT inguinal hernia containing fat with a small potential lymph node at the RIGHT inguinal canal. No definite acute intra-abdominal or intrapelvic abnormalities. Electronically Signed   By: Lavonia Dana M.D.   On: 07/11/2017 17:55    Assessment & Plan:   Katie was seen today for follow-up.  Diagnoses and all orders for this visit:  Acquired hypothyroidism -     TSH -     levothyroxine (SYNTHROID, LEVOTHROID) 75 MCG tablet; Take 1 tablet (75 mcg total) by mouth daily.  Left flank pain -     Urinalysis, Routine w reflex microscopic -  CBC   I have discontinued Katie [name]'s ketorolac. I am also having her maintain her brimonidine, bimatoprost, esomeprazole, pyridOXINE, BIOTIN PO, alendronate, Calcium Citrate-Vitamin D (CALCIUM CITRATE + D PO), cholestyramine, and levothyroxine.  Meds ordered this encounter  Medications  . levothyroxine (SYNTHROID, LEVOTHROID) 75 MCG tablet    Sig: Take 1 tablet (75 mcg total) by mouth daily.    Dispense:  90 tablet    Refill:  1   Believe that it she has been having intermittent spasms in her abdominal wall.  She will continue stretching to relieve.  Follow-up in 2 weeks for reassessment.  Follow-up: Return in about 2 weeks (around 09/09/2017).  Libby Maw,  MD

## 2017-08-26 NOTE — Addendum Note (Signed)
Addended by: Jon Billings on: 08/26/2017 04:56 PM   Modules accepted: Orders

## 2017-09-09 ENCOUNTER — Ambulatory Visit (INDEPENDENT_AMBULATORY_CARE_PROVIDER_SITE_OTHER): Payer: Medicare Other | Admitting: Family Medicine

## 2017-09-09 ENCOUNTER — Encounter: Payer: Self-pay | Admitting: Family Medicine

## 2017-09-09 VITALS — BP 120/72 | HR 59 | Temp 98.3°F | Ht 60.0 in | Wt 141.8 lb

## 2017-09-09 DIAGNOSIS — E039 Hypothyroidism, unspecified: Secondary | ICD-10-CM

## 2017-09-09 DIAGNOSIS — R109 Unspecified abdominal pain: Secondary | ICD-10-CM | POA: Diagnosis not present

## 2017-09-09 NOTE — Progress Notes (Signed)
Subjective:  Patient ID: Katie [name], female    DOB: [birth year]  Age: 77 y.o. MRN: [id number]  CC: Follow-up (pt sts flank pain has improved , pt sts pain is intermittenly, pt sts no pain currently)   HPI Katie [name] presents for follow-up of her left flank pain that is totally resolved.  She is having no issues taking her levothyroxine every morning on a fasting stomach an hour before eating.  She is having no issues with any of her other medicines.  Outpatient Medications Prior to Visit  Medication Sig Dispense Refill  . alendronate (FOSAMAX) 70 MG tablet Take 1 tablet (70 mg total) by mouth every 7 (seven) days. Take with a full glass of water on an empty stomach. 4 tablet 11  . bimatoprost (LUMIGAN) 0.01 % SOLN 1 drop at bedtime.    Marland Kitchen BIOTIN PO Take 1 tablet by mouth daily.    . brimonidine (ALPHAGAN P) 0.1 % SOLN Use in the morning and evening.    . Calcium Citrate-Vitamin D (CALCIUM CITRATE + D PO) Take by mouth 2 (two) times daily.    . cholestyramine (QUESTRAN) 4 g packet Take 1 packet (4 g total) by mouth 2 (two) times daily. 60 each 2  . esomeprazole (NEXIUM) 20 MG capsule Take 20 mg by mouth daily at 12 noon.    Marland Kitchen levothyroxine (SYNTHROID, LEVOTHROID) 75 MCG tablet Take 1 tablet (75 mcg total) by mouth daily. 90 tablet 1  . pyridOXINE (VITAMIN B-6) 100 MG tablet Take 100 mg by mouth daily.     No facility-administered medications prior to visit.     ROS Review of Systems  Constitutional: Negative for chills, fever and unexpected weight change.  Respiratory: Negative.   Cardiovascular: Negative.   Gastrointestinal: Negative.   Endocrine: Negative for cold intolerance and heat intolerance.  Neurological: Negative.   Hematological: Negative.   Psychiatric/Behavioral: Negative.     Objective:  BP 120/72   Pulse (!) 59   Temp 98.3 F (36.8 C)   Ht 5' (1.524 m)   Wt 141 lb 12.8 oz (64.3 kg)   SpO2 96%   BMI 27.69 kg/m   BP Readings from Last 3  Encounters:  09/09/17 120/72  08/26/17 130/70  07/04/17 128/80    Wt Readings from Last 3 Encounters:  09/09/17 141 lb 12.8 oz (64.3 kg)  08/26/17 144 lb 8 oz (65.5 kg)  07/04/17 143 lb 4 oz (65 kg)    Physical Exam  Constitutional: She is oriented to person, place, and time. She appears well-developed and well-nourished. No distress.  HENT:  Head: Normocephalic and atraumatic.  Right Ear: External ear normal.  Left Ear: External ear normal.  Eyes: Right eye exhibits no discharge. Left eye exhibits no discharge. No scleral icterus.  Neck: No JVD present. No tracheal deviation present.  Cardiovascular: Normal rate, regular rhythm and normal heart sounds.  Pulmonary/Chest: Effort normal and breath sounds normal.  Neurological: She is alert and oriented to person, place, and time.  Skin: She is not diaphoretic.  Psychiatric: She has a normal mood and affect. Her behavior is normal.    Lab Results  Component Value Date   WBC 6.2 08/26/2017   HGB 12.6 08/26/2017   HCT 37.4 08/26/2017   PLT 315.0 08/26/2017   GLUCOSE 96 07/04/2017   NA 134 (L) 07/04/2017   K 4.8 07/04/2017   CL 100 07/04/2017   CREATININE 0.84 07/04/2017   BUN 15 07/04/2017   CO2 27 07/04/2017  TSH 1.59 08/26/2017    Ct Abdomen Pelvis W Contrast  Result Date: 07/11/2017 CLINICAL DATA:  Abdominal pain, LEFT flank pain and LEFT upper quadrant pain for 2 weeks question renal colic, history of endometrial cancer EXAM: CT ABDOMEN AND PELVIS WITH CONTRAST TECHNIQUE: Multidetector CT imaging of the abdomen and pelvis was performed using the standard protocol following bolus administration of intravenous contrast. Sagittal and coronal MPR images reconstructed from axial data set. Exam degraded by motion artifacts. CONTRAST:  161mL ISOVUE-300 IOPAMIDOL (ISOVUE-300) INJECTION 61% IV. Dilute oral contrast. COMPARISON:  None FINDINGS: Lower chest: Lung bases clear Hepatobiliary: Gallbladder surgically absent.  Liver  unremarkable. Pancreas: Normal appearance Spleen: Normal appearance Adrenals/Urinary Tract: Adrenal glands normal appearance. Small RIGHT renal cyst. Kidneys, ureters, and bladder otherwise normal appearance. Stomach/Bowel: Stomach decompressed with small hiatal hernia noted. Prominent duodenal diverticulum. Appendix surgically absent by history. Few uncomplicated sigmoid diverticula. Bowel loops otherwise normal appearance. Vascular/Lymphatic: Atherosclerotic calcifications aorta without aneurysm. Aorta normal caliber. Single upper normal sized lymph node at RIGHT inguinal canal 11 mm short axis image 59. Reproductive: Uterus surgically absent. Nonvisualization of ovaries. Other: Small RIGHT inguinal hernia containing fat. No free air or free fluid. No definite inflammatory process. Musculoskeletal: Dextroconvex lumbar scoliosis. Osseous demineralization with scattered degenerative disc and facet disease changes. Orthopedic hardware proximal LEFT femur. IMPRESSION: Minimal sigmoid diverticulosis without evidence of diverticulitis. No urinary tract calcification or dilatation. Small RIGHT inguinal hernia containing fat with a small potential lymph node at the RIGHT inguinal canal. No definite acute intra-abdominal or intrapelvic abnormalities. Electronically Signed   By: Lavonia Dana M.D.   On: 07/11/2017 17:55    Assessment & Plan:   Katie [name] was seen today for follow-up.  Diagnoses and all orders for this visit:  Acquired hypothyroidism  Left flank pain   I am having Katie [name] maintain her brimonidine, bimatoprost, esomeprazole, pyridOXINE, BIOTIN PO, alendronate, Calcium Citrate-Vitamin D (CALCIUM CITRATE + D PO), cholestyramine, and levothyroxine.  No orders of the defined types were placed in this encounter.    Follow-up: Return in about 6 months (around 03/12/2018).  Katie Maw, MD

## 2017-09-26 ENCOUNTER — Encounter: Payer: Self-pay | Admitting: Family Medicine

## 2017-09-26 ENCOUNTER — Ambulatory Visit (INDEPENDENT_AMBULATORY_CARE_PROVIDER_SITE_OTHER): Payer: Medicare Other | Admitting: Family Medicine

## 2017-09-26 VITALS — BP 122/70 | HR 58 | Ht 60.0 in

## 2017-09-26 DIAGNOSIS — L03113 Cellulitis of right upper limb: Secondary | ICD-10-CM | POA: Diagnosis not present

## 2017-09-26 MED ORDER — CEPHALEXIN 500 MG PO CAPS: 500.0000 mg | ORAL_CAPSULE | Freq: Three times a day (TID) | ORAL | 0 refills | Status: AC

## 2017-09-26 NOTE — Progress Notes (Signed)
Subjective:  Patient ID: Katie [name], female    DOB: [birth year]  Age: 77 y.o. MRN: [id number]  CC: Insect Bite   HPI Katie [name] presents for evaluation of a wasp sting to her right arm 3 days ago.  There was initial swelling but now there is an erythematous rash from the site of the sting that extends towards her elbow.  She never did develop swelling in her throat shortness of breath tightness in her chest or generalized rash.  She applied a baking soda poultice.  Outpatient Medications Prior to Visit  Medication Sig Dispense Refill  . alendronate (FOSAMAX) 70 MG tablet Take 1 tablet (70 mg total) by mouth every 7 (seven) days. Take with a full glass of water on an empty stomach. 4 tablet 11  . bimatoprost (LUMIGAN) 0.01 % SOLN 1 drop at bedtime.    Marland Kitchen BIOTIN PO Take 1 tablet by mouth daily.    . brimonidine (ALPHAGAN P) 0.1 % SOLN Use in the morning and evening.    . Calcium Citrate-Vitamin D (CALCIUM CITRATE + D PO) Take by mouth 2 (two) times daily.    . cholestyramine (QUESTRAN) 4 g packet Take 1 packet (4 g total) by mouth 2 (two) times daily. 60 each 2  . esomeprazole (NEXIUM) 20 MG capsule Take 20 mg by mouth daily at 12 noon.    Marland Kitchen levothyroxine (SYNTHROID, LEVOTHROID) 75 MCG tablet Take 1 tablet (75 mcg total) by mouth daily. 90 tablet 1  . pyridOXINE (VITAMIN B-6) 100 MG tablet Take 100 mg by mouth daily.     No facility-administered medications prior to visit.     ROS Review of Systems  Constitutional: Negative.  Negative for chills, fatigue and fever.  Respiratory: Negative for chest tightness and shortness of breath.   Cardiovascular: Negative.   Gastrointestinal: Negative.  Negative for nausea and vomiting.  Genitourinary: Negative.   Musculoskeletal: Negative for arthralgias and myalgias.  Skin: Positive for color change and rash. Negative for wound.  Neurological: Negative for dizziness, weakness and numbness.  Hematological: Does not bruise/bleed  easily.  Psychiatric/Behavioral: Negative.     Objective:  BP 122/70   Pulse (!) 58   Ht 5' (1.524 m)   SpO2 94%   BMI 27.69 kg/m   BP Readings from Last 3 Encounters:  09/26/17 122/70  09/09/17 120/72  08/26/17 130/70    Wt Readings from Last 3 Encounters:  09/09/17 141 lb 12.8 oz (64.3 kg)  08/26/17 144 lb 8 oz (65.5 kg)  07/04/17 143 lb 4 oz (65 kg)    Physical Exam  Constitutional: She is oriented to person, place, and time. She appears well-developed and well-nourished. No distress.  HENT:  Head: Normocephalic and atraumatic.  Right Ear: External ear normal.  Left Ear: External ear normal.  Mouth/Throat: Oropharynx is clear and moist.  Eyes: Pupils are equal, round, and reactive to light. Conjunctivae and EOM are normal. Right eye exhibits no discharge. Left eye exhibits no discharge. No scleral icterus.  Neck: Neck supple. No JVD present. No tracheal deviation present. No thyromegaly present.  Cardiovascular: Normal rate, regular rhythm and normal heart sounds.  Pulmonary/Chest: Effort normal and breath sounds normal.  Neurological: She is alert and oriented to person, place, and time.  Skin: Skin is warm and dry. Capillary refill takes less than 2 seconds. Rash noted. She is not diaphoretic. There is erythema.     Psychiatric: She has a normal mood and affect. Her behavior is normal.  Lab Results  Component Value Date   WBC 6.2 08/26/2017   HGB 12.6 08/26/2017   HCT 37.4 08/26/2017   PLT 315.0 08/26/2017   GLUCOSE 96 07/04/2017   NA 134 (L) 07/04/2017   K 4.8 07/04/2017   CL 100 07/04/2017   CREATININE 0.84 07/04/2017   BUN 15 07/04/2017   CO2 27 07/04/2017   TSH 1.59 08/26/2017    Ct Abdomen Pelvis W Contrast  Result Date: 07/11/2017 CLINICAL DATA:  Abdominal pain, LEFT flank pain and LEFT upper quadrant pain for 2 weeks question renal colic, history of endometrial cancer EXAM: CT ABDOMEN AND PELVIS WITH CONTRAST TECHNIQUE: Multidetector CT imaging  of the abdomen and pelvis was performed using the standard protocol following bolus administration of intravenous contrast. Sagittal and coronal MPR images reconstructed from axial data set. Exam degraded by motion artifacts. CONTRAST:  134mL ISOVUE-300 IOPAMIDOL (ISOVUE-300) INJECTION 61% IV. Dilute oral contrast. COMPARISON:  None FINDINGS: Lower chest: Lung bases clear Hepatobiliary: Gallbladder surgically absent.  Liver unremarkable. Pancreas: Normal appearance Spleen: Normal appearance Adrenals/Urinary Tract: Adrenal glands normal appearance. Small RIGHT renal cyst. Kidneys, ureters, and bladder otherwise normal appearance. Stomach/Bowel: Stomach decompressed with small hiatal hernia noted. Prominent duodenal diverticulum. Appendix surgically absent by history. Few uncomplicated sigmoid diverticula. Bowel loops otherwise normal appearance. Vascular/Lymphatic: Atherosclerotic calcifications aorta without aneurysm. Aorta normal caliber. Single upper normal sized lymph node at RIGHT inguinal canal 11 mm short axis image 59. Reproductive: Uterus surgically absent. Nonvisualization of ovaries. Other: Small RIGHT inguinal hernia containing fat. No free air or free fluid. No definite inflammatory process. Musculoskeletal: Dextroconvex lumbar scoliosis. Osseous demineralization with scattered degenerative disc and facet disease changes. Orthopedic hardware proximal LEFT femur. IMPRESSION: Minimal sigmoid diverticulosis without evidence of diverticulitis. No urinary tract calcification or dilatation. Small RIGHT inguinal hernia containing fat with a small potential lymph node at the RIGHT inguinal canal. No definite acute intra-abdominal or intrapelvic abnormalities. Electronically Signed   By: Lavonia Dana M.D.   On: 07/11/2017 17:55    Assessment & Plan:   Katie [name] was seen today for insect bite.  Diagnoses and all orders for this visit:  Cellulitis of right upper extremity -     cephALEXin (KEFLEX) 500 MG  capsule; Take 1 capsule (500 mg total) by mouth 3 (three) times daily for 10 days.   I am having Katie [name] start on cephALEXin. I am also having her maintain her brimonidine, bimatoprost, esomeprazole, pyridOXINE, BIOTIN PO, alendronate, Calcium Citrate-Vitamin D (CALCIUM CITRATE + D PO), cholestyramine, and levothyroxine.  Meds ordered this encounter  Medications  . cephALEXin (KEFLEX) 500 MG capsule    Sig: Take 1 capsule (500 mg total) by mouth 3 (three) times daily for 10 days.    Dispense:  30 capsule    Refill:  0     Follow-up: Return in about 4 days (around 09/30/2017), or if symptoms worsen or fail to improve.  Libby Maw, MD

## 2017-09-26 NOTE — Patient Instructions (Signed)

## 2017-10-03 ENCOUNTER — Telehealth: Payer: Self-pay

## 2017-10-03 ENCOUNTER — Ambulatory Visit: Payer: Self-pay

## 2017-10-03 DIAGNOSIS — H401133 Primary open-angle glaucoma, bilateral, severe stage: Secondary | ICD-10-CM | POA: Diagnosis not present

## 2017-10-03 NOTE — Telephone Encounter (Signed)
I spoke with patient and she is still taking the Keflex. She is currently experiencing diarrhea and only has two days left of the medicine. She wants to know if it is okay to stop it, please advise.

## 2017-10-04 ENCOUNTER — Telehealth: Payer: Self-pay

## 2017-10-04 NOTE — Telephone Encounter (Signed)
Copied from [place] [phone]. Topic: General - Other >> Oct 03, 2017  5:06 PM Mcneil, Ja-Kwan wrote: Reason for CRM: Pt states she did not receive a call back and she can not continue to take the medication because it upsets her stomach. Pt just wants to advise that she will not take the remainder of medication

## 2017-10-04 NOTE — Telephone Encounter (Signed)
Diarrhea is a side effect. I gather that she finished most of the course of therapy. RTC if rash returns.

## 2017-10-04 NOTE — Telephone Encounter (Signed)
I called and spoke with patient and went over information below. Patient verbalized understanding.

## 2017-10-16 DIAGNOSIS — Z23 Encounter for immunization: Secondary | ICD-10-CM | POA: Diagnosis not present

## 2017-11-08 ENCOUNTER — Telehealth: Payer: Self-pay | Admitting: Emergency Medicine

## 2017-11-08 DIAGNOSIS — Z8542 Personal history of malignant neoplasm of other parts of uterus: Secondary | ICD-10-CM

## 2017-11-08 NOTE — Telephone Encounter (Signed)
Copied from [place] [phone]. Topic: General - Other >> Nov 08, 2017  9:35 AM Carolyn Stare wrote:  Pt said she need a referral to see a gyn. She had cancer and has to fup every year

## 2017-11-11 NOTE — Telephone Encounter (Signed)
Referral entered, they will contact patient to schedule.

## 2017-11-11 NOTE — Telephone Encounter (Signed)
Okay to refer? 

## 2017-11-11 NOTE — Addendum Note (Signed)
Addended by: Kateri Mc E on: 11/11/2017 08:34 AM   Modules accepted: Orders

## 2017-11-11 NOTE — Telephone Encounter (Signed)
Okay to place referral

## 2017-11-12 ENCOUNTER — Encounter: Payer: Self-pay | Admitting: Podiatry

## 2017-11-12 ENCOUNTER — Ambulatory Visit (INDEPENDENT_AMBULATORY_CARE_PROVIDER_SITE_OTHER): Payer: Medicare Other | Admitting: Podiatry

## 2017-11-12 DIAGNOSIS — M79674 Pain in right toe(s): Secondary | ICD-10-CM | POA: Diagnosis not present

## 2017-11-12 DIAGNOSIS — B351 Tinea unguium: Secondary | ICD-10-CM

## 2017-11-12 DIAGNOSIS — M79675 Pain in left toe(s): Secondary | ICD-10-CM | POA: Diagnosis not present

## 2017-11-12 NOTE — Progress Notes (Signed)
Complaint:  Visit Type: Patient returns to my office for continued preventative foot care services. Complaint: Patient states" my nails have grown long and thick and become painful to walk and wear shoes" Patient has been diagnosed with neuropathy due to chemotherapy. The patient presents for preventative foot care services. No changes to ROS  Podiatric Exam: Vascular: dorsalis pedis and posterior tibial pulses are palpable bilateral. Capillary return is immediate. Temperature gradient is WNL. Skin turgor WNL  Sensorium: Normal Semmes Weinstein monofilament test. Normal tactile sensation bilaterally. Nail Exam: Pt has thick disfigured discolored nails with subungual debris noted bilateral entire nail hallux through fifth toenails Ulcer Exam: There is no evidence of ulcer or pre-ulcerative changes or infection. Orthopedic Exam: Muscle tone and strength are WNL. No limitations in general ROM. No crepitus or effusions noted. Foot type and digits show no abnormalities. HAV 1st MPJ left foot.  Skin: No Porokeratosis. No infection or ulcers  Diagnosis:  Onychomycosis, , Pain in right toe, pain in left toes  Treatment & Plan Procedures and Treatment: Consent by patient was obtained for treatment procedures.   Debridement of mycotic and hypertrophic toenails, 1 through 5 bilateral and clearing of subungual debris. No ulceration, no infection noted.  Return Visit-Office Procedure: Patient instructed to return to the office for a follow up visit 3 months for continued evaluation and treatment.    Gardiner Barefoot DPM

## 2017-11-22 ENCOUNTER — Other Ambulatory Visit: Payer: Self-pay | Admitting: Gastroenterology

## 2017-11-25 ENCOUNTER — Other Ambulatory Visit: Payer: Self-pay | Admitting: Gastroenterology

## 2017-12-09 ENCOUNTER — Encounter: Payer: Self-pay | Admitting: Family Medicine

## 2017-12-09 ENCOUNTER — Ambulatory Visit: Payer: Medicare Other | Admitting: Family Medicine

## 2017-12-09 ENCOUNTER — Ambulatory Visit (INDEPENDENT_AMBULATORY_CARE_PROVIDER_SITE_OTHER): Payer: Medicare Other

## 2017-12-09 ENCOUNTER — Ambulatory Visit (INDEPENDENT_AMBULATORY_CARE_PROVIDER_SITE_OTHER): Payer: Medicare Other | Admitting: Family Medicine

## 2017-12-09 VITALS — Ht 60.0 in | Wt 141.0 lb

## 2017-12-09 DIAGNOSIS — M25561 Pain in right knee: Secondary | ICD-10-CM | POA: Diagnosis not present

## 2017-12-09 DIAGNOSIS — M1711 Unilateral primary osteoarthritis, right knee: Secondary | ICD-10-CM | POA: Diagnosis not present

## 2017-12-09 NOTE — Assessment & Plan Note (Signed)
Acute on chronic exacerbation of her symptoms. Likely degenerative in nature.  - injection  - xray today  -I recommend a medial unloader brace due to instability and due to her thigh to calf ratio. - will try gel injections.

## 2017-12-09 NOTE — Patient Instructions (Signed)
Good to see you  The [employer] rep will give you call  I will call you with the results from today  Please try icing and tylenol for pain  We will call you about setting up the gel injections.

## 2017-12-09 NOTE — Progress Notes (Addendum)
Katie [name] - 77 y.o. female MRN [id number]  Date of birth: 08-08-1940  SUBJECTIVE:  Including CC & ROS.  Chief Complaint  Patient presents with  . knee injection    R knee injection and would like to get another brace for it as well    [name] is a 77 y.o. female that is  Presenting with right knee pain. Pain is localized to the knee. Denies any giving way. No inciting event. Pain is acute on chronic in nature received a steroid injection earlier this year that lasted for a few months. Has been getting worse recently. Is walking with a cane.  Pain is localized to the medial joint line.   Review of Systems  Constitutional: Negative for fever.  HENT: Negative for congestion.   Respiratory: Negative for cough.   Cardiovascular: Negative for chest pain.  Gastrointestinal: Negative for abdominal pain.  Musculoskeletal: Positive for arthralgias and gait problem.  Skin: Negative for color change.  Neurological: Negative for weakness.  Hematological: Negative for adenopathy.  Psychiatric/Behavioral: Negative for agitation.    HISTORY: Past Medical, Surgical, Social, and Family History Reviewed & Updated per EMR.   Pertinent Historical Findings include:  Past Medical History:  Diagnosis Date  . Arthritis   . Cancer (HCC)    Endometrial  . Glaucoma   . Thyroid disease     Past Surgical History:  Procedure Laterality Date  . ABDOMINAL HYSTERECTOMY    . APPENDECTOMY    . BREAST SURGERY    . CHOLECYSTECTOMY    . FEMUR SURGERY  06/2013    Allergies  Allergen Reactions  . Sulfa Antibiotics Rash  . Gabapentin   . Lyrica [Pregabalin]     Family History  Problem Relation Age of Onset  . Heart disease Father   . Hearing loss Maternal Grandmother   . Hearing loss Maternal Grandfather   . Cancer Paternal Grandfather   . Cancer Sister      Social History   Socioeconomic History  . Marital status: Married    Spouse name: Not on file  . Number of children:  Not on file  . Years of education: Not on file  . Highest education level: Not on file  Occupational History  . Not on file  Social Needs  . Financial resource strain: Not on file  . Food insecurity:    Worry: Not on file    Inability: Not on file  . Transportation needs:    Medical: Not on file    Non-medical: Not on file  Tobacco Use  . Smoking status: Never Smoker  . Smokeless tobacco: Never Used  Substance and Sexual Activity  . Alcohol use: No    Frequency: Never  . Drug use: No  . Sexual activity: Not on file  Lifestyle  . Physical activity:    Days per week: Not on file    Minutes per session: Not on file  . Stress: Not on file  Relationships  . Social connections:    Talks on phone: Not on file    Gets together: Not on file    Attends religious service: Not on file    Active member of club or organization: Not on file    Attends meetings of clubs or organizations: Not on file    Relationship status: Not on file  . Intimate partner violence:    Fear of current or ex partner: Not on file    Emotionally abused: Not on file    Physically  abused: Not on file    Forced sexual activity: Not on file  Other Topics Concern  . Not on file  Social History Narrative  . Not on file     PHYSICAL EXAM:  VS: Ht 5' (1.524 m)   Wt 141 lb (64 kg)   BMI 27.54 kg/m  Physical Exam Gen: NAD, alert, cooperative with exam, well-appearing ENT: normal lips, normal nasal mucosa,  Eye: normal EOM, normal conjunctiva and lids CV:  no edema, +2 pedal pulses   Resp: no accessory muscle use, non-labored,  Skin: no rashes, no areas of induration  Neuro: normal tone, normal sensation to touch Psych:  normal insight, alert and oriented MSK:  Right knee: No obvious effusion. Mild tenderness to palpation of the medial joint line. Normal range of motion. Normal strength resistance. Instability with valgus stress testing. Negative McMurray's test. No significant pain with patellar  grind compression. Neurovascular intact   Aspiration/Injection Procedure Note Katie [name] 1940-03-15  Procedure: Injection Indications: right knee pain   Procedure Details Consent: Risks of procedure as well as the alternatives and risks of each were explained to the (patient/caregiver).  Consent for procedure obtained. Time Out: Verified patient identification, verified procedure, site/side was marked, verified correct patient position, special equipment/implants available, medications/allergies/relevent history reviewed, required imaging and test results available.  Performed.  The area was cleaned with iodine and alcohol swabs.    The right knee superior lateral SPP was injected using 1 cc's of 40 mg kenalog and 4 cc's of 0.25% bipivacaine with a 22 1 1/2" needle.  Ultrasound was used. Images were obtained in Long views showing the injection.    A sterile dressing was applied.  Patient did tolerate procedure well.     ASSESSMENT & PLAN:   OA (osteoarthritis) of knee Acute on chronic exacerbation of her symptoms. Likely degenerative in nature.  - injection  - xray today  -I recommend a medial unloader brace due to instability and due to her thigh to calf ratio. - will try gel injections.

## 2017-12-10 ENCOUNTER — Telehealth: Payer: Self-pay | Admitting: Family Medicine

## 2017-12-10 NOTE — Telephone Encounter (Signed)
Informed patient of xray results.   Rosemarie Ax, MD [facility] 12/10/2017, 9:54 AM

## 2017-12-11 ENCOUNTER — Encounter: Payer: Self-pay | Admitting: Obstetrics & Gynecology

## 2017-12-11 ENCOUNTER — Ambulatory Visit (INDEPENDENT_AMBULATORY_CARE_PROVIDER_SITE_OTHER): Payer: Medicare Other | Admitting: Obstetrics & Gynecology

## 2017-12-11 VITALS — BP 149/75 | HR 72 | Ht 62.0 in | Wt 143.0 lb

## 2017-12-11 DIAGNOSIS — Z8542 Personal history of malignant neoplasm of other parts of uterus: Secondary | ICD-10-CM

## 2017-12-11 DIAGNOSIS — Z01419 Encounter for gynecological examination (general) (routine) without abnormal findings: Secondary | ICD-10-CM | POA: Diagnosis not present

## 2017-12-11 DIAGNOSIS — Z1239 Encounter for other screening for malignant neoplasm of breast: Secondary | ICD-10-CM

## 2017-12-11 NOTE — Progress Notes (Addendum)
Subjective:     Katie [name] is a 77 y.o. female here for a routine exam. G2P2 Pt is s/p endometrial ca on her 77th birthday. Pt was told that she needs a CXR annually.s/p lap hyst with BSO. Had cholecystectomy at the same.  Pt had chemo and radiation.  Pt s spouse had a CVA last year and they moved to [place] from [place].  Pt denies GYN problems currently.  Pt had diarrhea after the radiation. That is now improved.         Gynecologic History No LMP recorded. Patient has had a hysterectomy. Contraception: post menopausal status Last Pap: unknown Last mammogram: 07/23/2016 results WNL (results under media)  Obstetric History OB History  Gravida Para Term Preterm AB Living  2 2 2     2   SAB TAB Ectopic Multiple Live Births          2    # Outcome Date GA Lbr Len/2nd Weight Sex Delivery Anes PTL Lv  2 Term 1965 [redacted]w[redacted]d  7 lb 10 oz (3.459 kg) F Vag-Spont EPI  LIV  1 Term 1963 [redacted]w[redacted]d  8 lb 3 oz (3.714 kg) F Vag-Spont EPI  LIV   The following portions of the patient's history were reviewed and updated as appropriate: allergies, current medications, past family history, past medical history, past social history, past surgical history and problem list.  Review of Systems Pertinent items are noted in HPI.    Objective:  BP (!) 149/75   Pulse 72   Ht 5\' 2"  (1.575 m)   Wt 143 lb 0.6 oz (64.9 kg)   BMI 26.16 kg/m   General Appearance:    Alert, cooperative, no distress, appears stated age  Head:    Normocephalic, without obvious abnormality, atraumatic alopecia in areas  Eyes:    conjunctiva/corneas clear, EOM's intact, both eyes  Ears:    Normal external ear canals, both ears  Nose:   Nares normal, septum midline, mucosa normal, no drainage    or sinus tenderness  Throat:   Lips, mucosa, and tongue normal; teeth and gums normal  Neck:   Supple, symmetrical, trachea midline, no adenopathy;    thyroid:  no enlargement/tenderness/nodules  Back:     Symmetric, no curvature, ROM normal,  no CVA tenderness  Lungs:     Clear to auscultation bilaterally, respirations unlabored  Chest Wall:    No tenderness or deformity   Heart:    Regular rate and rhythm, S1 and S2 normal, no murmur, rub   or gallop  Breast Exam:    No tenderness, masses, or nipple abnormality  Abdomen:     Soft, non-tender, bowel sounds active all four quadrants,    no masses, no organomegaly  Genitalia:    Normal female without lesion, discharge or tenderness; the vagina is scared and radiation changes are noted. The vagina is foreshortened       Extremities:   Extremities normal, atraumatic, no cyanosis or edema  Pulses:   2+ and symmetric all extremities  Skin:   Skin color, texture, turgor normal, no rashes or lesions     Assessment:    Healthy female exam.   H/o endometrial cancer Osteoporosis on meds    Plan:    Follow up in: 1 year.    Screening mammogram  CXR as surveillance for endo ca recurrence Need records from [place]- pt has signed release.  Need to ascertain the date of her last Dexa  Rocio Roam L. Harraway-Smith, M.D.,  FACOG .  Addendum: last BMD was 07/2015  Hoyle Sauer L. Harraway-Smith, M.D., [name]

## 2017-12-13 ENCOUNTER — Encounter: Payer: Self-pay | Admitting: Obstetrics & Gynecology

## 2017-12-16 ENCOUNTER — Other Ambulatory Visit (HOSPITAL_BASED_OUTPATIENT_CLINIC_OR_DEPARTMENT_OTHER): Payer: Medicare Other

## 2017-12-16 ENCOUNTER — Ambulatory Visit (HOSPITAL_BASED_OUTPATIENT_CLINIC_OR_DEPARTMENT_OTHER): Payer: Medicare Other

## 2017-12-16 ENCOUNTER — Telehealth: Payer: Self-pay

## 2017-12-16 NOTE — Telephone Encounter (Signed)
Patient called stating that her last bone density was on 07-29-15.   If we want a copy of it the fax number to the facility is [phone]. Kathrene Alu RN

## 2017-12-19 ENCOUNTER — Other Ambulatory Visit: Payer: Self-pay | Admitting: Family Medicine

## 2017-12-19 DIAGNOSIS — E039 Hypothyroidism, unspecified: Secondary | ICD-10-CM

## 2017-12-21 ENCOUNTER — Ambulatory Visit (HOSPITAL_BASED_OUTPATIENT_CLINIC_OR_DEPARTMENT_OTHER)
Admission: RE | Admit: 2017-12-21 | Discharge: 2017-12-21 | Disposition: A | Discharge status: Home / Self Care | Payer: Medicare Other | Source: Ambulatory Visit | Attending: Obstetrics & Gynecology | Admitting: Obstetrics & Gynecology

## 2017-12-21 ENCOUNTER — Encounter (HOSPITAL_BASED_OUTPATIENT_CLINIC_OR_DEPARTMENT_OTHER): Payer: Self-pay

## 2017-12-21 ENCOUNTER — Other Ambulatory Visit: Payer: Self-pay | Admitting: Obstetrics & Gynecology

## 2017-12-21 DIAGNOSIS — Z8542 Personal history of malignant neoplasm of other parts of uterus: Secondary | ICD-10-CM

## 2017-12-21 DIAGNOSIS — Z1239 Encounter for other screening for malignant neoplasm of breast: Secondary | ICD-10-CM | POA: Insufficient documentation

## 2017-12-21 DIAGNOSIS — Z1231 Encounter for screening mammogram for malignant neoplasm of breast: Secondary | ICD-10-CM | POA: Diagnosis not present

## 2017-12-21 DIAGNOSIS — J984 Other disorders of lung: Secondary | ICD-10-CM | POA: Diagnosis not present

## 2017-12-21 DIAGNOSIS — R918 Other nonspecific abnormal finding of lung field: Secondary | ICD-10-CM | POA: Insufficient documentation

## 2017-12-30 ENCOUNTER — Telehealth: Payer: Self-pay

## 2017-12-30 NOTE — Telephone Encounter (Signed)
Copied from [place] [id number]. Topic: Quick Communication - See Telephone Encounter >> Dec 30, 2017 10:07 AM Hewitt Shorts wrote: Pt is calling to check on status for her appt with Dr. Raeford Razor in regards to her right knee injections  Best number [phone]

## 2018-01-07 DIAGNOSIS — H401133 Primary open-angle glaucoma, bilateral, severe stage: Secondary | ICD-10-CM | POA: Diagnosis not present

## 2018-01-08 ENCOUNTER — Telehealth: Payer: Self-pay

## 2018-01-08 NOTE — Telephone Encounter (Signed)
Left VM for patient. If she calls back please have her speak with a nurse/CMA and inform that she can make an appt for the gel injections and she will need to ask the rep about the brace. The [facility] can report results to patient.   If any questions then please take the best time and phone number to call and I will try to call her back.   Rosemarie Ax, MD [facility] 01/08/2018, 11:11 AM

## 2018-01-08 NOTE — Telephone Encounter (Signed)
Copied from [place] [id number]. Topic: General - Inquiry >> Jan 08, 2018 10:32 AM Windy Kalata wrote: Reason for CRM: Pt received her knee brace in the mail and needs directions on how to use it, also would like to know when she will start getting her injections, please advise.  Best call back is [phone] is her cell

## 2018-01-08 NOTE — Telephone Encounter (Signed)
Pt called back/[facility] informed pt of JS's message that was LOVM but pt could not remember the reps number/I spoke with JS and he said that the rep is going to reach out to her/I advised her that Linton Rump will be in contact with her then I scheduled her an appt for the the Gel Injection for 12.4.19 @ 11:30am/thx dmf

## 2018-01-08 NOTE — Telephone Encounter (Signed)
Patient called and explained the results of her chest Xray and the recommendation of a CT of the chest.   Patient states that she does not need CT. She states that it has been present since she had a collapsed lung in 1971 and it is present on her Xray regularly.  Patient does NOT want to do CT of chest.   Will route to provider. Kathrene Alu RN

## 2018-01-09 ENCOUNTER — Ambulatory Visit (INDEPENDENT_AMBULATORY_CARE_PROVIDER_SITE_OTHER): Payer: Medicare Other

## 2018-01-09 ENCOUNTER — Encounter: Payer: Self-pay | Admitting: Family Medicine

## 2018-01-09 ENCOUNTER — Ambulatory Visit (INDEPENDENT_AMBULATORY_CARE_PROVIDER_SITE_OTHER): Payer: Medicare Other | Admitting: Family Medicine

## 2018-01-09 VITALS — BP 140/70 | HR 75 | Temp 97.8°F | Ht 62.0 in | Wt 143.0 lb

## 2018-01-09 DIAGNOSIS — M1711 Unilateral primary osteoarthritis, right knee: Secondary | ICD-10-CM | POA: Diagnosis not present

## 2018-01-09 NOTE — Progress Notes (Signed)
Katie [name] - 77 y.o. female MRN [id number]  Date of birth: 1940/04/13  SUBJECTIVE:  Including CC & ROS.  Chief Complaint  Patient presents with  . Knee Pain    right knee    [name] is a 77 y.o. female that is  Presenting with acute on chronic right knee pain. Pain is localized to the knee. Ranges from moderate to severe. Worse with flexion and going up or down stairs. Has swelling from time to time. Received steroid injection with mild improvement. Has received gel injections in the past.  She is getting fitted for a custom medial unloader brace.  Independent review of the right knee x-ray from 11/4 shows end-stage degenerative changes of the medial joint line.   Review of Systems  Constitutional: Negative for fever.  HENT: Negative for congestion.   Respiratory: Negative for cough.   Cardiovascular: Negative for chest pain.  Gastrointestinal: Negative for abdominal pain.  Musculoskeletal: Positive for arthralgias and joint swelling.  Skin: Negative for color change.  Neurological: Negative for weakness.  Hematological: Negative for adenopathy.  Psychiatric/Behavioral: Negative for agitation.    HISTORY: Past Medical, Surgical, Social, and Family History Reviewed & Updated per EMR.   Pertinent Historical Findings include:  Past Medical History:  Diagnosis Date  . Arthritis   . Cancer (HCC)    Endometrial  . Glaucoma   . Thyroid disease     Past Surgical History:  Procedure Laterality Date  . ABDOMINAL HYSTERECTOMY    . APPENDECTOMY    . BREAST EXCISIONAL BIOPSY Right    benign, was a cyst  . BREAST SURGERY    . CHOLECYSTECTOMY    . FEMUR SURGERY  06/2013    Allergies  Allergen Reactions  . Sulfa Antibiotics Rash  . Gabapentin   . Lyrica [Pregabalin]     Family History  Problem Relation Age of Onset  . Heart disease Father   . Hearing loss Maternal Grandmother   . Hearing loss Maternal Grandfather   . Cancer Paternal Grandfather   .  Cancer Sister      Social History   Socioeconomic History  . Marital status: Married    Spouse name: Not on file  . Number of children: Not on file  . Years of education: Not on file  . Highest education level: Not on file  Occupational History  . Not on file  Social Needs  . Financial resource strain: Not on file  . Food insecurity:    Worry: Not on file    Inability: Not on file  . Transportation needs:    Medical: Not on file    Non-medical: Not on file  Tobacco Use  . Smoking status: Never Smoker  . Smokeless tobacco: Never Used  Substance and Sexual Activity  . Alcohol use: No    Frequency: Never  . Drug use: No  . Sexual activity: Not on file  Lifestyle  . Physical activity:    Days per week: Not on file    Minutes per session: Not on file  . Stress: Not on file  Relationships  . Social connections:    Talks on phone: Not on file    Gets together: Not on file    Attends religious service: Not on file    Active member of club or organization: Not on file    Attends meetings of clubs or organizations: Not on file    Relationship status: Not on file  . Intimate partner violence:  Fear of current or ex partner: Not on file    Emotionally abused: Not on file    Physically abused: Not on file    Forced sexual activity: Not on file  Other Topics Concern  . Not on file  Social History Narrative  . Not on file     PHYSICAL EXAM:  VS: BP 140/70 (BP Location: Left Arm, Patient Position: Sitting, Cuff Size: Normal)   Pulse 75   Temp 97.8 F (36.6 C) (Oral)   Ht 5\' 2"  (1.575 m)   Wt 143 lb (64.9 kg)   SpO2 97%   BMI 26.16 kg/m  Physical Exam Gen: NAD, alert, cooperative with exam, well-appearing ENT: normal lips, normal nasal mucosa,  Eye: normal EOM, normal conjunctiva and lids CV:  no edema, +2 pedal pulses   Resp: no accessory muscle use, non-labored,  Skin: no rashes, no areas of induration  Neuro: normal tone, normal sensation to touch Psych:   normal insight, alert and oriented MSK:  Right knee: No obvious effusion. Tenderness palpation of the medial joint line. Normal flexion and extension. Instability with valgus and varus stress testing. Negative McMurray's test. No pain with patellar grind. Neurovascular intact    Aspiration/Injection Procedure Note Katie [name] 24-Feb-1940  Procedure: Injection Indications: right knee  Procedure Details Consent: Risks of procedure as well as the alternatives and risks of each were explained to the (patient/caregiver).  Consent for procedure obtained. Time Out: Verified patient identification, verified procedure, site/side was marked, verified correct patient position, special equipment/implants available, medications/allergies/relevent history reviewed, required imaging and test results available.  Performed.  The area was cleaned with iodine and alcohol swabs.    The right knee superior lateral suprapatellar pouch was injected using 4 cc's of 1% lidocaine with a 22 1 1/2" needle.  The syringe was switched and a 2 mL 15 mg/mL of Orthovisc was injected. Ultrasound was used. Images were obtained in  Long views showing the injection.    A sterile dressing was applied.  Patient did tolerate procedure well.        ASSESSMENT & PLAN:   Osteoarthritis of right knee Acute on chronic in nature. Started first orthovisc today  - orthovisc injection today  - counseled on supportive care - f/u in one week.

## 2018-01-09 NOTE — Patient Instructions (Signed)
Good to see you  Please ice your knee  Please see me back in one week.

## 2018-01-09 NOTE — Assessment & Plan Note (Signed)
Acute on chronic in nature. Started first orthovisc today  - orthovisc injection today  - counseled on supportive care - f/u in one week.

## 2018-01-14 ENCOUNTER — Ambulatory Visit (INDEPENDENT_AMBULATORY_CARE_PROVIDER_SITE_OTHER): Payer: Medicare Other | Admitting: Sports Medicine

## 2018-01-14 ENCOUNTER — Encounter: Payer: Self-pay | Admitting: Sports Medicine

## 2018-01-14 DIAGNOSIS — B351 Tinea unguium: Secondary | ICD-10-CM

## 2018-01-14 DIAGNOSIS — M79675 Pain in left toe(s): Secondary | ICD-10-CM

## 2018-01-14 DIAGNOSIS — T451X5A Adverse effect of antineoplastic and immunosuppressive drugs, initial encounter: Secondary | ICD-10-CM

## 2018-01-14 DIAGNOSIS — M79674 Pain in right toe(s): Secondary | ICD-10-CM | POA: Diagnosis not present

## 2018-01-14 DIAGNOSIS — G620 Drug-induced polyneuropathy: Secondary | ICD-10-CM

## 2018-01-14 DIAGNOSIS — I739 Peripheral vascular disease, unspecified: Secondary | ICD-10-CM

## 2018-01-14 NOTE — Progress Notes (Signed)
Subjective: Katie [name] is a 77 y.o. female patient seen today in office with complaint of mildly painful thickened and elongated toenails; unable to trim. Patient denies history of Diabetes but has neuropathy secondary to Chemo. Reports ingrowns at 1st toes bilateral that sometimes she has to trim herself because her nails grow out quickly. Patient has no other pedal complaints at this time.   Patient Active Problem List   Diagnosis Date Noted  . Cellulitis of right upper extremity 09/26/2017  . Left flank pain 08/26/2017  . OA (osteoarthritis) of knee 06/03/2017  . Loose stools 05/30/2017  . Chronic diarrhea 05/30/2017  . 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 Osteoarthritis of right knee 05/27/2017  . Labyrinthitis 04/18/2017  . Need for assistance due to unsteady gait 04/18/2017  . Age-related osteoporosis without current pathological fracture 01/23/2017  . Acquired hypothyroidism 01/23/2017  . History of endometrial cancer 01/23/2017    Current Outpatient Medications on File Prior to Visit  Medication Sig Dispense Refill  . alendronate (FOSAMAX) 70 MG tablet Take 1 tablet (70 mg total) by mouth every 7 (seven) days. Take with a full glass of water on an empty stomach. 4 tablet 11  . bimatoprost (LUMIGAN) 0.01 % SOLN 1 drop at bedtime.    Marland Kitchen BIOTIN PO Take 1 tablet by mouth daily.    . brimonidine (ALPHAGAN P) 0.1 % SOLN Use in the morning and evening.    . Calcium Citrate-Vitamin D (CALCIUM CITRATE + D PO) Take by mouth 2 (two) times daily.    . cholestyramine (QUESTRAN) 4 g packet MIX AND DRINK 1 PACKET(4 GRAMS) BY MOUTH TWICE DAILY 180 packet 1  . esomeprazole (NEXIUM) 20 MG capsule Take 20 mg by mouth daily at 12 noon.    Marland Kitchen FLUAD 0.5 ML SUSY ADM 0.5ML IM UTD  0  . levothyroxine (SYNTHROID, LEVOTHROID) 50 MCG tablet   0  . pyridOXINE (VITAMIN B-6) 100 MG tablet Take 100 mg by mouth daily.     No current facility-administered medications on file prior to visit.      Allergies  Allergen Reactions  . Sulfa Antibiotics Rash  . Gabapentin   . Lyrica [Pregabalin]     Objective: Physical Exam  General: Well developed, nourished, no acute distress, awake, alert and oriented x 3  Vascular: Dorsalis pedis artery 1/4 bilateral, Posterior tibial artery 0/4 bilateral due to trace edema at ankles, skin temperature warm to warm proximal to distal bilateral lower extremities, ++ varicosities, no pedal hair present bilateral.  Neurological: Gross sensation present via light touch bilateral.   Dermatological: Skin is warm, dry, and supple bilateral, Nails 1-10 are tender, long, thick, and discolored with mild subungal debris with mild ingrowing with no signs of infection, no webspace macerations present bilateral, no open lesions present bilateral, no callus/corns/hyperkeratotic tissue present bilateral. No signs of infection bilateral.  Musculoskeletal: Asymptomatic bunion on left and  pes planus boney deformities noted bilateral. Muscular strength within normal limits without pain on range of motion. No pain with calf compression bilateral.  Assessment and Plan:  Problem List Items Addressed This Visit    None    Visit Diagnoses    Pain due to onychomycosis of toenails of both feet    -  Primary   PVD (peripheral vascular disease) ([place])       Peripheral neuropathy due to chemotherapy [facility])          -Examined patient.  -Discussed treatment options for painful mycotic nails. -Mechanically debrided and  reduced mycotic nails with sterile nail nipper and dremel nail file without incident. -Patient to return in 9 weeks for follow up evaluation or sooner if symptoms worsen.  Landis Martins, DPM

## 2018-01-16 ENCOUNTER — Ambulatory Visit (INDEPENDENT_AMBULATORY_CARE_PROVIDER_SITE_OTHER): Payer: Medicare Other | Admitting: Family Medicine

## 2018-01-16 ENCOUNTER — Ambulatory Visit (INDEPENDENT_AMBULATORY_CARE_PROVIDER_SITE_OTHER): Payer: Medicare Other

## 2018-01-16 DIAGNOSIS — M1711 Unilateral primary osteoarthritis, right knee: Secondary | ICD-10-CM | POA: Diagnosis not present

## 2018-01-16 NOTE — Patient Instructions (Addendum)
Good to see you  Please try to ice your knee  Please follow up in one week.

## 2018-01-16 NOTE — Progress Notes (Signed)
Katie [name] - 77 y.o. female MRN [id number]  Date of birth: 1940/09/25  SUBJECTIVE:  Including CC & ROS.  No chief complaint on file.   Katie [name] is a 77 y.o. female that is presenting for her second injection of Orthovisc.   Review of Systems  HISTORY: Past Medical, Surgical, Social, and Family History Reviewed & Updated per EMR.   Pertinent Historical Findings include:  Past Medical History:  Diagnosis Date  . Arthritis   . Cancer (HCC)    Endometrial  . Glaucoma   . Thyroid disease     Past Surgical History:  Procedure Laterality Date  . ABDOMINAL HYSTERECTOMY    . APPENDECTOMY    . BREAST EXCISIONAL BIOPSY Right    benign, was a cyst  . BREAST SURGERY    . CHOLECYSTECTOMY    . FEMUR SURGERY  06/2013    Allergies  Allergen Reactions  . Sulfa Antibiotics Rash  . Gabapentin   . Lyrica [Pregabalin]     Family History  Problem Relation Age of Onset  . Heart disease Father   . Hearing loss Maternal Grandmother   . Hearing loss Maternal Grandfather   . Cancer Paternal Grandfather   . Cancer Sister      Social History   Socioeconomic History  . Marital status: Married    Spouse name: Not on file  . Number of children: Not on file  . Years of education: Not on file  . Highest education level: Not on file  Occupational History  . Not on file  Social Needs  . Financial resource strain: Not on file  . Food insecurity:    Worry: Not on file    Inability: Not on file  . Transportation needs:    Medical: Not on file    Non-medical: Not on file  Tobacco Use  . Smoking status: Never Smoker  . Smokeless tobacco: Never Used  Substance and Sexual Activity  . Alcohol use: No    Frequency: Never  . Drug use: No  . Sexual activity: Not on file  Lifestyle  . Physical activity:    Days per week: Not on file    Minutes per session: Not on file  . Stress: Not on file  Relationships  . Social connections:    Talks on phone: Not on file   Gets together: Not on file    Attends religious service: Not on file    Active member of club or organization: Not on file    Attends meetings of clubs or organizations: Not on file    Relationship status: Not on file  . Intimate partner violence:    Fear of current or ex partner: Not on file    Emotionally abused: Not on file    Physically abused: Not on file    Forced sexual activity: Not on file  Other Topics Concern  . Not on file  Social History Narrative  . Not on file     PHYSICAL EXAM:  VS: There were no vitals taken for this visit. Physical Exam Gen: NAD, alert, cooperative with exam, well-appearing   Aspiration/Injection Procedure Note [name] 01/22/1941  Procedure: Injection Indications: Right knee pain  Procedure Details Consent: Risks of procedure as well as the alternatives and risks of each were explained to the (patient/caregiver).  Consent for procedure obtained. Time Out: Verified patient identification, verified procedure, site/side was marked, verified correct patient position, special equipment/implants available, medications/allergies/relevent history reviewed, required imaging and test results available.  Performed.  The area was cleaned with iodine and alcohol swabs.    The right knee superior lateral suprapatellar pouch was injected using 4 cc's of 1% lidocaine with a 22 1 1/2" needle.  The syringe was switched and a 2 mL 15 mg/mL of Orthovisc was injected. Ultrasound was used. Images were obtained in  Long views showing the injection.    A sterile dressing was applied.  Patient did tolerate procedure well.     ASSESSMENT & PLAN:   Osteoarthritis of right knee Completed the second injection of the series of 3 of Orthovisc. -Follow-up in 1 week.

## 2018-01-16 NOTE — Assessment & Plan Note (Signed)
Completed the second injection of the series of 3 of Orthovisc. -Follow-up in 1 week.

## 2018-01-23 ENCOUNTER — Ambulatory Visit (INDEPENDENT_AMBULATORY_CARE_PROVIDER_SITE_OTHER): Payer: Medicare Other

## 2018-01-23 ENCOUNTER — Ambulatory Visit (INDEPENDENT_AMBULATORY_CARE_PROVIDER_SITE_OTHER): Payer: Medicare Other | Admitting: Family Medicine

## 2018-01-23 DIAGNOSIS — M25552 Pain in left hip: Secondary | ICD-10-CM | POA: Diagnosis not present

## 2018-01-23 DIAGNOSIS — M1711 Unilateral primary osteoarthritis, right knee: Secondary | ICD-10-CM | POA: Diagnosis not present

## 2018-01-23 DIAGNOSIS — Z96642 Presence of left artificial hip joint: Secondary | ICD-10-CM

## 2018-01-23 NOTE — Progress Notes (Signed)
Katie [name] - 77 y.o. female MRN [id number]  Date of birth: [birth year]  SUBJECTIVE:  Including CC & ROS.  No chief complaint on file.   Katie [name] is a 77 y.o. female that is here to finish her Orthovisc injections..    Review of Systems  HISTORY: Past Medical, Surgical, Social, and Family History Reviewed & Updated per EMR.   Pertinent Historical Findings include:  Past Medical History:  Diagnosis Date  . Arthritis   . Cancer (HCC)    Endometrial  . Glaucoma   . Thyroid disease     Past Surgical History:  Procedure Laterality Date  . ABDOMINAL HYSTERECTOMY    . APPENDECTOMY    . BREAST EXCISIONAL BIOPSY Right    benign, was a cyst  . BREAST SURGERY    . CHOLECYSTECTOMY    . FEMUR SURGERY  06/2013    Allergies  Allergen Reactions  . Sulfa Antibiotics Rash  . Gabapentin   . Lyrica [Pregabalin]     Family History  Problem Relation Age of Onset  . Heart disease Father   . Hearing loss Maternal Grandmother   . Hearing loss Maternal Grandfather   . Cancer Paternal Grandfather   . Cancer Sister      Social History   Socioeconomic History  . Marital status: Married    Spouse name: Not on file  . Number of children: Not on file  . Years of education: Not on file  . Highest education level: Not on file  Occupational History  . Not on file  Social Needs  . Financial resource strain: Not on file  . Food insecurity:    Worry: Not on file    Inability: Not on file  . Transportation needs:    Medical: Not on file    Non-medical: Not on file  Tobacco Use  . Smoking status: Never Smoker  . Smokeless tobacco: Never Used  Substance and Sexual Activity  . Alcohol use: No    Frequency: Never  . Drug use: No  . Sexual activity: Not on file  Lifestyle  . Physical activity:    Days per week: Not on file    Minutes per session: Not on file  . Stress: Not on file  Relationships  . Social connections:    Talks on phone: Not on file    Gets  together: Not on file    Attends religious service: Not on file    Active member of club or organization: Not on file    Attends meetings of clubs or organizations: Not on file    Relationship status: Not on file  . Intimate partner violence:    Fear of current or ex partner: Not on file    Emotionally abused: Not on file    Physically abused: Not on file    Forced sexual activity: Not on file  Other Topics Concern  . Not on file  Social History Narrative  . Not on file     PHYSICAL EXAM:  VS: There were no vitals taken for this visit. Physical Exam Gen: NAD, alert, cooperative with exam, well-appearing   Aspiration/Injection Procedure Note [name] 1940-09-21  Procedure: Injection Indications: right knee pain   Procedure Details Consent: Risks of procedure as well as the alternatives and risks of each were explained to the (patient/caregiver).  Consent for procedure obtained. Time Out: Verified patient identification, verified procedure, site/side was marked, verified correct patient position, special equipment/implants available, medications/allergies/relevent history reviewed, required imaging and  test results available.  Performed.  The area was cleaned with iodine and alcohol swabs.    The right knee superior lateral suprapatellar pouch was injected using 4 cc's of 1% lidocaine with a 22 1 1/2" needle.  The syringe was switched and a 2 mL 15 mg/mL of Orthovisc was injected. Ultrasound was used. Images were obtained in  Long views showing the injection.    A sterile dressing was applied.  Patient did tolerate procedure well.      ASSESSMENT & PLAN:   Osteoarthritis of right knee Finished her series of Orthovisc injections. -She can follow-up in 1 month to monitor.  History of total hip arthroplasty, left Has not had her hip x-rayed in some time.  Has a history of surgery. -We will obtain x-ray today.

## 2018-01-23 NOTE — Assessment & Plan Note (Signed)
Has not had her hip x-rayed in some time.  Has a history of surgery. -We will obtain x-ray today.

## 2018-01-23 NOTE — Patient Instructions (Signed)
Good to see you  Please try ice on the knee Please see [place] back in one month  Happy Holidays!

## 2018-01-23 NOTE — Assessment & Plan Note (Signed)
Finished her series of Orthovisc injections. -She can follow-up in 1 month to monitor.

## 2018-01-27 ENCOUNTER — Telehealth: Payer: Self-pay | Admitting: Family Medicine

## 2018-01-27 NOTE — Telephone Encounter (Signed)
Spoke with patient about xray.   Rosemarie Ax, MD [facility] 01/27/2018, 8:22 AM

## 2018-02-02 ENCOUNTER — Other Ambulatory Visit: Payer: Self-pay | Admitting: Family Medicine

## 2018-02-02 DIAGNOSIS — M810 Age-related osteoporosis without current pathological fracture: Secondary | ICD-10-CM

## 2018-02-28 ENCOUNTER — Telehealth: Payer: Self-pay

## 2018-02-28 NOTE — Telephone Encounter (Signed)
Hey do you know who I need to contact for this ?  Copied from [place]. Topic: General - Other >> Feb 28, 2018 12:32 PM Alanda Slim E wrote: Reason for CRM: Pt called and right knee brace is sliding down her leg. She needs the representative Lurena Joiner) to come in if possible during the time when she has an OV scheduled with Dr. Ethelene Hal and adjust the brace.

## 2018-03-03 NOTE — Telephone Encounter (Signed)
Spoke to Edith Endave, he will contact pt to schedule a time.

## 2018-03-05 DIAGNOSIS — H906 Mixed conductive and sensorineural hearing loss, bilateral: Secondary | ICD-10-CM | POA: Diagnosis not present

## 2018-03-12 ENCOUNTER — Other Ambulatory Visit: Payer: Self-pay | Admitting: Family Medicine

## 2018-03-13 ENCOUNTER — Ambulatory Visit: Payer: Medicare Other | Admitting: Family Medicine

## 2018-03-17 ENCOUNTER — Encounter: Payer: Self-pay | Admitting: Family Medicine

## 2018-03-17 ENCOUNTER — Ambulatory Visit (INDEPENDENT_AMBULATORY_CARE_PROVIDER_SITE_OTHER): Payer: Medicare Other | Admitting: Family Medicine

## 2018-03-17 VITALS — BP 128/70 | HR 78 | Ht 62.0 in | Wt 145.0 lb

## 2018-03-17 DIAGNOSIS — M810 Age-related osteoporosis without current pathological fracture: Secondary | ICD-10-CM

## 2018-03-17 DIAGNOSIS — E7800 Pure hypercholesterolemia, unspecified: Secondary | ICD-10-CM | POA: Diagnosis not present

## 2018-03-17 DIAGNOSIS — Z0000 Encounter for general adult medical examination without abnormal findings: Secondary | ICD-10-CM

## 2018-03-17 DIAGNOSIS — E039 Hypothyroidism, unspecified: Secondary | ICD-10-CM

## 2018-03-17 DIAGNOSIS — H903 Sensorineural hearing loss, bilateral: Secondary | ICD-10-CM | POA: Diagnosis not present

## 2018-03-17 LAB — COMPREHENSIVE METABOLIC PANEL
ALT: 16 U/L (ref 0–35)
AST: 21 U/L (ref 0–37)
Albumin: 4 g/dL (ref 3.5–5.2)
Alkaline Phosphatase: 36 U/L — ABNORMAL LOW (ref 39–117)
BUN: 21 mg/dL (ref 6–23)
CO2: 24 mEq/L (ref 19–32)
Calcium: 9.4 mg/dL (ref 8.4–10.5)
Chloride: 102 mEq/L (ref 96–112)
Creatinine, Ser: 0.83 mg/dL (ref 0.40–1.20)
GFR: 66.24 mL/min (ref >60.00)
Glucose, Bld: 84 mg/dL (ref 70–99)
Potassium: 4 mEq/L (ref 3.5–5.1)
Sodium: 135 mEq/L (ref 135–145)
Total Bilirubin: 0.5 mg/dL (ref 0.2–1.2)
Total Protein: 6.8 g/dL (ref 6.0–8.3)

## 2018-03-17 LAB — URINALYSIS, ROUTINE W REFLEX MICROSCOPIC
Bilirubin Urine: NEGATIVE
Hgb urine dipstick: NEGATIVE
Ketones, ur: NEGATIVE
Leukocytes, UA: NEGATIVE
Nitrite: NEGATIVE
RBC / HPF: NONE SEEN (ref 0–?)
Specific Gravity, Urine: 1.015 (ref 1.000–1.030)
Total Protein, Urine: NEGATIVE
Urine Glucose: NEGATIVE
Urobilinogen, UA: 0.2 (ref 0.0–1.0)
pH: 5 (ref 5.0–8.0)

## 2018-03-17 LAB — CBC
HCT: 37.8 % (ref 36.0–46.0)
Hemoglobin: 12.5 g/dL (ref 12.0–15.0)
MCHC: 33 g/dL (ref 30.0–36.0)
MCV: 91.1 fl (ref 78.0–100.0)
Platelets: 281 10*3/uL (ref 150.0–400.0)
RBC: 4.16 Mil/uL (ref 3.87–5.11)
RDW: 14 % (ref 11.5–15.5)
WBC: 6.3 10*3/uL (ref 4.0–10.5)

## 2018-03-17 LAB — LDL CHOLESTEROL, DIRECT: Direct LDL: 109 mg/dL

## 2018-03-17 LAB — TSH: TSH: 4.66 u[IU]/mL — ABNORMAL HIGH (ref 0.35–4.50)

## 2018-03-17 LAB — VITAMIN D 25 HYDROXY (VIT D DEFICIENCY, FRACTURES): VITD: 26.94 ng/mL — ABNORMAL LOW (ref 30.00–100.00)

## 2018-03-17 NOTE — Progress Notes (Signed)
Established Patient Office Visit  Subjective:  Patient ID: Katie [name], female    DOB: 1940/07/30  Age: 78 y.o. MRN: [id number]  CC:  Chief Complaint  Patient presents with  . Follow-up    HPI [name] presents for follow-up of her hypothyroidism.  Assures compliance with her Synthroid.  She admits that she does not always remember to take her calcium with vitamin D.  Cannot remember the last time she had a bone scan.  She has swelling in her lower extremities and finds it difficult to use compression hose.  Consumed with the care of her invalid husband.  Patient denies recent falls in the past year.  She does use a cane to stabilize her gait.  She feels lightheaded at times but denies a spinning sensation or palpitations.  Past Medical History:  Diagnosis Date  . Arthritis   . Cancer (HCC)    Endometrial  . Glaucoma   . Thyroid disease     Past Surgical History:  Procedure Laterality Date  . ABDOMINAL HYSTERECTOMY    . APPENDECTOMY    . BREAST EXCISIONAL BIOPSY Right    benign, was a cyst  . BREAST SURGERY    . CHOLECYSTECTOMY    . FEMUR SURGERY  06/2013    Family History  Problem Relation Age of Onset  . Heart disease Father   . Hearing loss Maternal Grandmother   . Hearing loss Maternal Grandfather   . Cancer Paternal Grandfather   . Cancer Sister     Social History   Socioeconomic History  . Marital status: Married    Spouse name: Not on file  . Number of children: Not on file  . Years of education: Not on file  . Highest education level: Not on file  Occupational History  . Not on file  Social Needs  . Financial resource strain: Not on file  . Food insecurity:    Worry: Not on file    Inability: Not on file  . Transportation needs:    Medical: Not on file    Non-medical: Not on file  Tobacco Use  . Smoking status: Never Smoker  . Smokeless tobacco: Never Used  Substance and Sexual Activity  . Alcohol use: No    Frequency: Never    . Drug use: No  . Sexual activity: Not on file  Lifestyle  . Physical activity:    Days per week: Not on file    Minutes per session: Not on file  . Stress: Not on file  Relationships  . Social connections:    Talks on phone: Not on file    Gets together: Not on file    Attends religious service: Not on file    Active member of club or organization: Not on file    Attends meetings of clubs or organizations: Not on file    Relationship status: Not on file  . Intimate partner violence:    Fear of current or ex partner: Not on file    Emotionally abused: Not on file    Physically abused: Not on file    Forced sexual activity: Not on file  Other Topics Concern  . Not on file  Social History Narrative  . Not on file    Outpatient Medications Prior to Visit  Medication Sig Dispense Refill  . alendronate (FOSAMAX) 70 MG tablet TAKE 1 TABLET BY MOUTH EVERY 7 DAYS. TAKE WITH A FULL GLASS OF WATER ON AN EMPTY STOMACH 4 tablet 11  .  bimatoprost (LUMIGAN) 0.01 % SOLN 1 drop at bedtime.    Marland Kitchen BIOTIN PO Take 1 tablet by mouth daily.    . brimonidine (ALPHAGAN P) 0.1 % SOLN Use in the morning and evening.    . Calcium Citrate-Vitamin D (CALCIUM CITRATE + D PO) Take by mouth 2 (two) times daily.    . cholestyramine (QUESTRAN) 4 g packet MIX AND DRINK 1 PACKET(4 GRAMS) BY MOUTH TWICE DAILY 180 packet 1  . esomeprazole (NEXIUM) 20 MG capsule Take 20 mg by mouth daily at 12 noon.    Marland Kitchen levothyroxine (SYNTHROID, LEVOTHROID) 50 MCG tablet TAKE 1 TABLET(50 MCG) BY MOUTH DAILY BEFORE BREAKFAST 90 tablet 1  . pyridOXINE (VITAMIN B-6) 100 MG tablet Take 100 mg by mouth daily.    Marland Kitchen FLUAD 0.5 ML SUSY ADM 0.5ML IM UTD  0   No facility-administered medications prior to visit.     Allergies  Allergen Reactions  . Sulfa Antibiotics Rash  . Gabapentin   . Lyrica [Pregabalin]     ROS Review of Systems  Constitutional: Negative for chills, diaphoresis, fatigue, fever and unexpected weight change.   HENT: Positive for hearing loss. Negative for congestion, postnasal drip, rhinorrhea and sinus pain.   Eyes: Negative for photophobia and visual disturbance.  Respiratory: Negative.   Cardiovascular: Negative.   Gastrointestinal: Negative.   Endocrine: Negative for cold intolerance and heat intolerance.  Genitourinary: Negative.   Musculoskeletal: Negative for joint swelling and myalgias.  Skin: Negative for pallor.  Allergic/Immunologic: Negative for immunocompromised state.  Neurological: Negative for seizures and headaches.  Hematological: Does not bruise/bleed easily.  Psychiatric/Behavioral: Negative.       Objective:    Physical Exam  Constitutional: She is oriented to person, place, and time. She appears well-developed and well-nourished. No distress.  HENT:  Head: Normocephalic and atraumatic.  Right Ear: External ear normal.  Left Ear: External ear normal.  Mouth/Throat: Oropharynx is clear and moist. No oropharyngeal exudate.  Eyes: Pupils are equal, round, and reactive to light. Conjunctivae are normal. Right eye exhibits no discharge. Left eye exhibits no discharge. No scleral icterus.  Neck: Neck supple. No JVD present. No tracheal deviation present. No thyromegaly present.  Cardiovascular: Normal rate, regular rhythm and normal heart sounds.  Pulmonary/Chest: Effort normal and breath sounds normal. No stridor.  Abdominal: Bowel sounds are normal.  Musculoskeletal:        General: No edema.       Legs:  Lymphadenopathy:    She has no cervical adenopathy.  Neurological: She is alert and oriented to person, place, and time.  Skin: Skin is warm and dry. She is not diaphoretic.  Psychiatric: She has a normal mood and affect. Her behavior is normal.    BP 128/70   Pulse 78   Ht 5\' 2"  (1.575 m)   Wt 145 lb (65.8 kg)   SpO2 98%   BMI 26.52 kg/m  Wt Readings from Last 3 Encounters:  03/17/18 145 lb (65.8 kg)  01/09/18 143 lb (64.9 kg)  12/11/17 143 lb 0.6 oz  (64.9 kg)   BP Readings from Last 3 Encounters:  03/17/18 128/70  01/09/18 140/70  12/11/17 (!) 149/75   Guideline developer:  UpToDate (see UpToDate for funding source) Date Released: June 2014  Health Maintenance Due  Topic Date Due  . DEXA SCAN  10/18/2003  . PNA vac Low Risk Adult (2 of 2 - PPSV23) 01/08/2012    There are no preventive care reminders to display for this  patient.  Lab Results  Component Value Date   TSH 1.59 08/26/2017   Lab Results  Component Value Date   WBC 6.2 08/26/2017   HGB 12.6 08/26/2017   HCT 37.4 08/26/2017   MCV 91.3 08/26/2017   PLT 315.0 08/26/2017   Lab Results  Component Value Date   NA 134 (L) 07/04/2017   K 4.8 07/04/2017   CO2 27 07/04/2017   GLUCOSE 96 07/04/2017   BUN 15 07/04/2017   CREATININE 0.84 07/04/2017   CALCIUM 9.4 07/04/2017   GFR 69.57 07/04/2017   No results found for: CHOL No results found for: HDL No results found for: LDLCALC No results found for: TRIG No results found for: CHOLHDL No results found for: HGBA1C    Assessment & Plan:   Problem List Items Addressed This Visit      Endocrine   Acquired hypothyroidism - Primary   Relevant Orders   CBC   TSH     Musculoskeletal and Integument   Osteoporosis without current pathological fracture   Relevant Orders   VITAMIN D 25 Hydroxy (Vit-D Deficiency, Fractures)   DG Bone Density     Other   Healthcare maintenance   Relevant Orders   Comprehensive metabolic panel   Urinalysis, Routine w reflex microscopic   Elevated LDL cholesterol level   Relevant Orders   LDL cholesterol, direct      No orders of the defined types were placed in this encounter.   Follow-up: Return in about 6 months (around 09/15/2018), or if symptoms worsen or fail to improve.   Encourage patient to find a pair of compression stockings that are comfortable for her to apply.  Otherwise ordered a DEXA scan.

## 2018-03-18 ENCOUNTER — Other Ambulatory Visit: Payer: Self-pay

## 2018-03-18 DIAGNOSIS — E039 Hypothyroidism, unspecified: Secondary | ICD-10-CM

## 2018-03-19 ENCOUNTER — Encounter: Payer: Self-pay | Admitting: Podiatry

## 2018-03-19 ENCOUNTER — Ambulatory Visit (INDEPENDENT_AMBULATORY_CARE_PROVIDER_SITE_OTHER): Payer: Medicare Other | Admitting: Podiatry

## 2018-03-19 DIAGNOSIS — I739 Peripheral vascular disease, unspecified: Secondary | ICD-10-CM

## 2018-03-19 DIAGNOSIS — T451X5A Adverse effect of antineoplastic and immunosuppressive drugs, initial encounter: Secondary | ICD-10-CM

## 2018-03-19 DIAGNOSIS — G620 Drug-induced polyneuropathy: Secondary | ICD-10-CM

## 2018-03-19 DIAGNOSIS — M79674 Pain in right toe(s): Secondary | ICD-10-CM

## 2018-03-19 DIAGNOSIS — M79675 Pain in left toe(s): Secondary | ICD-10-CM

## 2018-03-19 DIAGNOSIS — B351 Tinea unguium: Secondary | ICD-10-CM

## 2018-03-19 NOTE — Progress Notes (Signed)
Complaint:  Visit Type: Patient returns to my office for continued preventative foot care services. Complaint: Patient states" my nails have grown long and thick and become painful to walk and wear shoes" Patient has been diagnosed with neuropathy due to chemotherapy. The patient presents for preventative foot care services. No changes to ROS  Podiatric Exam: Vascular: dorsalis pedis and posterior tibial pulses are palpable bilateral. Capillary return is immediate. Temperature gradient is WNL. Skin turgor WNL  Sensorium: Normal Semmes Weinstein monofilament test. Normal tactile sensation bilaterally. Nail Exam: Pt has thick disfigured discolored nails with subungual debris noted bilateral entire nail hallux through fifth toenails Ulcer Exam: There is no evidence of ulcer or pre-ulcerative changes or infection. Orthopedic Exam: Muscle tone and strength are WNL. No limitations in general ROM. No crepitus or effusions noted. Foot type and digits show no abnormalities. HAV 1st MPJ left foot.  Skin: No Porokeratosis. No infection or ulcers  Diagnosis:  Onychomycosis, , Pain in right toe, pain in left toes  Treatment & Plan Procedures and Treatment: Consent by patient was obtained for treatment procedures.   Debridement of mycotic and hypertrophic toenails, 1 through 5 bilateral and clearing of subungual debris. No ulceration, no infection noted.  Return Visit-Office Procedure: Patient instructed to return to the office for a follow up visit 9 weeks  for continued evaluation and treatment.    Gardiner Barefoot DPM

## 2018-03-21 ENCOUNTER — Ambulatory Visit (HOSPITAL_BASED_OUTPATIENT_CLINIC_OR_DEPARTMENT_OTHER)
Admission: RE | Admit: 2018-03-21 | Discharge: 2018-03-21 | Disposition: A | Discharge status: Home / Self Care | Payer: Medicare Other | Source: Ambulatory Visit | Attending: Family Medicine | Admitting: Family Medicine

## 2018-03-21 DIAGNOSIS — M810 Age-related osteoporosis without current pathological fracture: Secondary | ICD-10-CM | POA: Insufficient documentation

## 2018-03-21 DIAGNOSIS — Z780 Asymptomatic menopausal state: Secondary | ICD-10-CM | POA: Diagnosis not present

## 2018-04-10 DIAGNOSIS — H401133 Primary open-angle glaucoma, bilateral, severe stage: Secondary | ICD-10-CM | POA: Diagnosis not present

## 2018-05-13 ENCOUNTER — Other Ambulatory Visit: Payer: Medicare Other

## 2018-05-16 ENCOUNTER — Other Ambulatory Visit: Payer: Medicare Other | Admitting: Family Medicine

## 2018-05-21 ENCOUNTER — Ambulatory Visit: Payer: Medicare Other | Admitting: Podiatry

## 2018-07-02 ENCOUNTER — Ambulatory Visit: Payer: Medicare Other | Admitting: Podiatry

## 2018-07-11 DIAGNOSIS — H401133 Primary open-angle glaucoma, bilateral, severe stage: Secondary | ICD-10-CM | POA: Diagnosis not present

## 2018-08-05 ENCOUNTER — Encounter: Payer: Self-pay | Admitting: Podiatry

## 2018-08-05 ENCOUNTER — Ambulatory Visit (INDEPENDENT_AMBULATORY_CARE_PROVIDER_SITE_OTHER): Payer: Medicare Other | Admitting: Podiatry

## 2018-08-05 ENCOUNTER — Other Ambulatory Visit: Payer: Self-pay

## 2018-08-05 DIAGNOSIS — M79675 Pain in left toe(s): Secondary | ICD-10-CM | POA: Insufficient documentation

## 2018-08-05 DIAGNOSIS — M79674 Pain in right toe(s): Secondary | ICD-10-CM

## 2018-08-05 DIAGNOSIS — B351 Tinea unguium: Secondary | ICD-10-CM | POA: Insufficient documentation

## 2018-08-05 NOTE — Progress Notes (Signed)
Complaint:  Visit Type: Patient returns to my office for continued preventative foot care services. Complaint: Patient states" my nails have grown long and thick and become painful to walk and wear shoes" Patient has been diagnosed with neuropathy due to chemotherapy. The patient presents for preventative foot care services. No changes to ROS  Podiatric Exam: Vascular: dorsalis pedis and posterior tibial pulses are palpable bilateral. Capillary return is immediate. Temperature gradient is WNL. Skin turgor WNL  Sensorium: Normal Semmes Weinstein monofilament test. Normal tactile sensation bilaterally. Nail Exam: Pt has thick disfigured discolored nails with subungual debris noted bilateral entire nail hallux through fifth toenails Ulcer Exam: There is no evidence of ulcer or pre-ulcerative changes or infection. Orthopedic Exam: Muscle tone and strength are WNL. No limitations in general ROM. No crepitus or effusions noted. Foot type and digits show no abnormalities. HAV 1st MPJ left foot.  Skin: No Porokeratosis. No infection or ulcers  Diagnosis:  Onychomycosis, , Pain in right toe, pain in left toes  Treatment & Plan Procedures and Treatment: Consent by patient was obtained for treatment procedures.   Debridement of mycotic and hypertrophic toenails, 1 through 5 bilateral and clearing of subungual debris. No ulceration, no infection noted.  Return Visit-Office Procedure: Patient instructed to return to the office for a follow up visit 9 weeks  for continued evaluation and treatment.    Gardiner Barefoot DPM

## 2018-08-14 ENCOUNTER — Ambulatory Visit: Payer: Self-pay

## 2018-08-14 ENCOUNTER — Other Ambulatory Visit: Payer: Self-pay

## 2018-08-14 ENCOUNTER — Ambulatory Visit (INDEPENDENT_AMBULATORY_CARE_PROVIDER_SITE_OTHER): Payer: Medicare Other | Admitting: Family Medicine

## 2018-08-14 VITALS — Ht 61.0 in | Wt 144.0 lb

## 2018-08-14 DIAGNOSIS — M1711 Unilateral primary osteoarthritis, right knee: Secondary | ICD-10-CM

## 2018-08-14 MED ORDER — TRIAMCINOLONE ACETONIDE 40 MG/ML IJ SUSP
40.0000 mg | Freq: Once | INTRAMUSCULAR | Status: AC
  Administered 2018-08-14: 40 mg via INTRA_ARTICULAR

## 2018-08-14 NOTE — Patient Instructions (Addendum)
Good to see you Please try ice  The [employer] rep will call you  Please use tylenol   We will call you once the gel injection is approved Please send me a message in MyChart with any questions or updates.  Please see me back in 4 weeks.   --Dr. Raeford Razor

## 2018-08-14 NOTE — Assessment & Plan Note (Signed)
Acute exacerbation of underlying arthritis.  Has significant degenerative changes of the medial joint line upon last x-ray. -Injection. -Counseled on supportive care. -Can pursue gel injection going forward. -Could consider surgery.

## 2018-08-14 NOTE — Progress Notes (Signed)
Katie [name] - 78 y.o. female MRN [id number]  Date of birth: 27-Aug-1940  SUBJECTIVE:  Including CC & ROS.  Chief Complaint  Patient presents with  . Knee Pain    right knee    [name] is a 78 y.o. female that is presenting with acute on chronic right knee pain.  It is acutely exacerbated over the past few weeks.  She has a history of underlying arthritis.  She wears a medial unloader brace with some improvement of her symptoms.  She has received steroid injections and gel injections in the past.  These will help for a period of time.  Last injection she received were in December.  She denies any mechanical symptoms today.  Has some pain with ambulation.  The pain is intermittent in nature.  It is mild to severe.  It is throbbing.  It is localized to the knee.   Review of Systems  Constitutional: Negative for fever.  HENT: Negative for congestion.   Respiratory: Negative for cough.   Cardiovascular: Positive for leg swelling. Negative for chest pain.  Gastrointestinal: Negative for abdominal pain.  Musculoskeletal: Positive for arthralgias and joint swelling.  Skin: Negative for color change.  Neurological: Negative for weakness.  Hematological: Negative for adenopathy.    HISTORY: Past Medical, Surgical, Social, and Family History Reviewed & Updated per EMR.   Pertinent Historical Findings include:  Past Medical History:  Diagnosis Date  . Arthritis   . Cancer (HCC)    Endometrial  . Glaucoma   . Thyroid disease     Past Surgical History:  Procedure Laterality Date  . ABDOMINAL HYSTERECTOMY    . APPENDECTOMY    . BREAST EXCISIONAL BIOPSY Right    benign, was a cyst  . BREAST SURGERY    . CHOLECYSTECTOMY    . FEMUR SURGERY  06/2013    Allergies  Allergen Reactions  . Sulfa Antibiotics Rash  . Gabapentin   . Lyrica [Pregabalin]     Family History  Problem Relation Age of Onset  . Heart disease Father   . Hearing loss Maternal Grandmother   .  Hearing loss Maternal Grandfather   . Cancer Paternal Grandfather   . Cancer Sister      Social History   Socioeconomic History  . Marital status: Married    Spouse name: Not on file  . Number of children: Not on file  . Years of education: Not on file  . Highest education level: Not on file  Occupational History  . Not on file  Social Needs  . Financial resource strain: Not on file  . Food insecurity    Worry: Not on file    Inability: Not on file  . Transportation needs    Medical: Not on file    Non-medical: Not on file  Tobacco Use  . Smoking status: Never Smoker  . Smokeless tobacco: Never Used  Substance and Sexual Activity  . Alcohol use: No    Frequency: Never  . Drug use: No  . Sexual activity: Not on file  Lifestyle  . Physical activity    Days per week: Not on file    Minutes per session: Not on file  . Stress: Not on file  Relationships  . Social Herbalist on phone: Not on file    Gets together: Not on file    Attends religious service: Not on file    Active member of club or organization: Not on file  Attends meetings of clubs or organizations: Not on file    Relationship status: Not on file  . Intimate partner violence    Fear of current or ex partner: Not on file    Emotionally abused: Not on file    Physically abused: Not on file    Forced sexual activity: Not on file  Other Topics Concern  . Not on file  Social History Narrative  . Not on file     PHYSICAL EXAM:  VS: Ht 5\' 1"  (1.549 m)   Wt 144 lb (65.3 kg)   BMI 27.21 kg/m  Physical Exam Gen: NAD, alert, cooperative with exam, well-appearing ENT: normal lips, normal nasal mucosa,  Eye: normal EOM, normal conjunctiva and lids CV:  no edema, +2 pedal pulses   Resp: no accessory muscle use, non-labored,  Skin: no rashes, no areas of induration  Neuro: normal tone, normal sensation to touch Psych:  normal insight, alert and oriented MSK:  Right knee:  Mild effusion   Normal ROM  Normal strength to resistance  Instability with valgus and varus testing  Neurovascularly intact    Aspiration/Injection Procedure Note Katie [name] [birth year]  Procedure: Injection Indications: right knee pain   Procedure Details Consent: Risks of procedure as well as the alternatives and risks of each were explained to the (patient/caregiver).  Consent for procedure obtained. Time Out: Verified patient identification, verified procedure, site/side was marked, verified correct patient position, special equipment/implants available, medications/allergies/relevent history reviewed, required imaging and test results available.  Performed.  The area was cleaned with iodine and alcohol swabs.    The right knee superior lateral suprapatellar pouch was injected using 1 cc's of 40 mg Kenalog and 4 cc's of 0.25% bupivacaine with a 22 1 1/2" needle.  Ultrasound was used. Images were obtained in long views showing the injection.     A sterile dressing was applied.  Patient did tolerate procedure well.       ASSESSMENT & PLAN:   Osteoarthritis of right knee Acute exacerbation of underlying arthritis.  Has significant degenerative changes of the medial joint line upon last x-ray. -Injection. -Counseled on supportive care. -Can pursue gel injection going forward. -Could consider surgery.

## 2018-08-24 ENCOUNTER — Other Ambulatory Visit: Payer: Self-pay | Admitting: Family Medicine

## 2018-08-25 ENCOUNTER — Telehealth: Payer: Self-pay | Admitting: Family Medicine

## 2018-08-25 MED ORDER — LEVOTHYROXINE SODIUM 50 MCG PO TABS: ORAL_TABLET | ORAL | 1 refills | Status: DC

## 2018-08-25 NOTE — Telephone Encounter (Signed)
Needs to have ordered TSH drawn prior to visit on the 10th.

## 2018-08-25 NOTE — Telephone Encounter (Signed)
Patient is calling to ask does she need to come in for lab work for her 6 months follow up on 09/15/2018. Please advise [phone]

## 2018-08-25 NOTE — Telephone Encounter (Signed)
Please advise 

## 2018-08-25 NOTE — Telephone Encounter (Signed)
Medication: levothyroxine (SYNTHROID, LEVOTHROID) 50 MCG tablet [[id number]]   Has the patient contacted their pharmacy? Yes  (Agent: If no, request that the patient contact the pharmacy for the refill.) (Agent: If yes, when and what did the pharmacy advise?)  Preferred Pharmacy (with phone number or street name): [id number] - [place], [address] AT [address] [phone] (Phone) [phone] (Fax)    Agent: Please be advised that RX refills may take up to 3 business days. We ask that you follow-up with your pharmacy.

## 2018-08-25 NOTE — Telephone Encounter (Signed)
Rx sent 

## 2018-08-26 NOTE — Telephone Encounter (Signed)
Pt returned vm to schedule lab appt. No answer on scheduling line x3. Please return pt's call to schedule. Also pt stated her husband who is Dr. Bebe Shaggy pt was admitted to hospital on yesterday for falling and hitting his head. Katie [name] [birth year]

## 2018-08-26 NOTE — Telephone Encounter (Signed)
Alright, its done

## 2018-08-26 NOTE — Telephone Encounter (Signed)
Okay to schedule Katie [name]'s lab appt. Will update Dr. Ethelene Hal on husband.

## 2018-08-26 NOTE — Telephone Encounter (Signed)
I left pt a voicemail to call back to schedule a lab appt for her. I advised labs for her husband will be ordered after he is seen if he needs them.

## 2018-08-28 ENCOUNTER — Encounter: Payer: Self-pay | Admitting: Family Medicine

## 2018-08-28 ENCOUNTER — Ambulatory Visit (INDEPENDENT_AMBULATORY_CARE_PROVIDER_SITE_OTHER): Payer: Medicare Other | Admitting: Family Medicine

## 2018-08-28 ENCOUNTER — Telehealth: Payer: Self-pay | Admitting: Behavioral Health

## 2018-08-28 VITALS — BP 128/80 | HR 105 | Ht 61.0 in | Wt 142.4 lb

## 2018-08-28 DIAGNOSIS — W57XXXA Bitten or stung by nonvenomous insect and other nonvenomous arthropods, initial encounter: Secondary | ICD-10-CM | POA: Diagnosis not present

## 2018-08-28 DIAGNOSIS — S60562A Insect bite (nonvenomous) of left hand, initial encounter: Secondary | ICD-10-CM | POA: Diagnosis not present

## 2018-08-28 DIAGNOSIS — S60569A Insect bite (nonvenomous) of unspecified hand, initial encounter: Secondary | ICD-10-CM

## 2018-08-28 MED ORDER — TRIAMCINOLONE ACETONIDE 0.5 % EX OINT: 1.0000 "application " | TOPICAL_OINTMENT | Freq: Two times a day (BID) | CUTANEOUS | 1 refills | Status: DC

## 2018-08-28 NOTE — Progress Notes (Signed)
Established Patient Office Visit  Subjective:  Patient ID: Katie [name], female    DOB: [birth year]  Age: 78 y.o. MRN: [id number]  CC:  Chief Complaint  Patient presents with  . Urticaria    HPI [name] presents for evaluation and treatment of a rash on her hands and left arm.  She thinks that it may have started after an ant bite to her left forearm.  Rash is described as fixed individual bumps that are pruritic.  Some of them staying.  No other body part is affected.  Denies any type of new skin contacts.  She is stressed.  Her husband is home from the hospital after another fall.  He has been negligent of self care, mildly demented and does not always follow his doctor's advice.  She is his primary caregiver  Past Medical History:  Diagnosis Date  . Arthritis   . Cancer (HCC)    Endometrial  . Glaucoma   . Thyroid disease     Past Surgical History:  Procedure Laterality Date  . ABDOMINAL HYSTERECTOMY    . APPENDECTOMY    . BREAST EXCISIONAL BIOPSY Right    benign, was a cyst  . BREAST SURGERY    . CHOLECYSTECTOMY    . FEMUR SURGERY  06/2013    Family History  Problem Relation Age of Onset  . Heart disease Father   . Hearing loss Maternal Grandmother   . Hearing loss Maternal Grandfather   . Cancer Paternal Grandfather   . Cancer Sister     Social History   Socioeconomic History  . Marital status: Married    Spouse name: Not on file  . Number of children: Not on file  . Years of education: Not on file  . Highest education level: Not on file  Occupational History  . Not on file  Social Needs  . Financial resource strain: Not on file  . Food insecurity    Worry: Not on file    Inability: Not on file  . Transportation needs    Medical: Not on file    Non-medical: Not on file  Tobacco Use  . Smoking status: Never Smoker  . Smokeless tobacco: Never Used  Substance and Sexual Activity  . Alcohol use: No    Frequency: Never  . Drug use: No   . Sexual activity: Not on file  Lifestyle  . Physical activity    Days per week: Not on file    Minutes per session: Not on file  . Stress: Not on file  Relationships  . Social Herbalist on phone: Not on file    Gets together: Not on file    Attends religious service: Not on file    Active member of club or organization: Not on file    Attends meetings of clubs or organizations: Not on file    Relationship status: Not on file  . Intimate partner violence    Fear of current or ex partner: Not on file    Emotionally abused: Not on file    Physically abused: Not on file    Forced sexual activity: Not on file  Other Topics Concern  . Not on file  Social History Narrative  . Not on file    Outpatient Medications Prior to Visit  Medication Sig Dispense Refill  . alendronate (FOSAMAX) 70 MG tablet TAKE 1 TABLET BY MOUTH EVERY 7 DAYS. TAKE WITH A FULL GLASS OF WATER ON AN EMPTY STOMACH  4 tablet 11  . bimatoprost (LUMIGAN) 0.01 % SOLN 1 drop at bedtime.    Marland Kitchen BIOTIN PO Take 1 tablet by mouth daily.    . brimonidine (ALPHAGAN P) 0.1 % SOLN Use in the morning and evening.    . Calcium Citrate-Vitamin D (CALCIUM CITRATE + D PO) Take by mouth 2 (two) times daily.    . cholestyramine (QUESTRAN) 4 g packet MIX AND DRINK 1 PACKET(4 GRAMS) BY MOUTH TWICE DAILY 180 packet 1  . esomeprazole (NEXIUM) 20 MG capsule Take 20 mg by mouth daily at 12 noon.    Marland Kitchen levothyroxine (SYNTHROID) 50 MCG tablet TAKE 1 TABLET(50 MCG) BY MOUTH DAILY BEFORE BREAKFAST 90 tablet 1  . pyridOXINE (VITAMIN B-6) 100 MG tablet Take 100 mg by mouth daily.     No facility-administered medications prior to visit.     Allergies  Allergen Reactions  . Sulfa Antibiotics Rash  . Gabapentin   . Lyrica [Pregabalin]     ROS Review of Systems  Constitutional: Negative.   Respiratory: Negative.   Cardiovascular: Negative.   Gastrointestinal: Negative.   Skin: Positive for color change, rash and wound.   Psychiatric/Behavioral: Negative.       Objective:    Physical Exam  Constitutional: She is oriented to person, place, and time. She appears well-developed and well-nourished. No distress.  HENT:  Head: Normocephalic and atraumatic.  Right Ear: External ear normal.  Left Ear: External ear normal.  Eyes: Right eye exhibits no discharge. Left eye exhibits no discharge. No scleral icterus.  Pulmonary/Chest: Effort normal.  Neurological: She is alert and oriented to person, place, and time.  Skin: Skin is warm and dry. She is not diaphoretic.     Psychiatric: She has a normal mood and affect. Her behavior is normal.    BP 128/80   Pulse (!) 105   Ht 5\' 1"  (1.549 m)   Wt 142 lb 6 oz (64.6 kg)   SpO2 96%   BMI 26.90 kg/m  Wt Readings from Last 3 Encounters:  08/28/18 142 lb 6 oz (64.6 kg)  08/14/18 144 lb (65.3 kg)  03/17/18 145 lb (65.8 kg)   BP Readings from Last 3 Encounters:  08/28/18 128/80  03/17/18 128/70  01/09/18 140/70   Guideline developer:  UpToDate (see UpToDate for funding source) Date Released: June 2014  Health Maintenance Due  Topic Date Due  . PNA vac Low Risk Adult (2 of 2 - PPSV23) 01/08/2012    There are no preventive care reminders to display for this patient.  Lab Results  Component Value Date   TSH 4.66 (H) 03/17/2018   Lab Results  Component Value Date   WBC 6.3 03/17/2018   HGB 12.5 03/17/2018   HCT 37.8 03/17/2018   MCV 91.1 03/17/2018   PLT 281.0 03/17/2018   Lab Results  Component Value Date   NA 135 03/17/2018   K 4.0 03/17/2018   CO2 24 03/17/2018   GLUCOSE 84 03/17/2018   BUN 21 03/17/2018   CREATININE 0.83 03/17/2018   BILITOT 0.5 03/17/2018   ALKPHOS 36 (L) 03/17/2018   AST 21 03/17/2018   ALT 16 03/17/2018   PROT 6.8 03/17/2018   ALBUMIN 4.0 03/17/2018   CALCIUM 9.4 03/17/2018   GFR 66.24 03/17/2018   No results found for: CHOL No results found for: HDL No results found for: LDLCALC No results found for:  TRIG No results found for: CHOLHDL No results found for: HGBA1C    Assessment &  Plan:   Problem List Items Addressed This Visit    None    Visit Diagnoses    Insect bite of hand, unspecified laterality, initial encounter    -  Primary   Relevant Medications   triamcinolone ointment (KENALOG) 0.5 %      Meds ordered this encounter  Medications  . triamcinolone ointment (KENALOG) 0.5 %    Sig: Apply 1 application topically 2 (two) times daily. For rash on arms and hands only.    Dispense:  30 g    Refill:  1    Follow-up: Return if symptoms worsen or fail to improve.   Patient is scheduled for blood work on 3 August and to see me on the 10th.

## 2018-08-28 NOTE — Telephone Encounter (Signed)

## 2018-09-11 ENCOUNTER — Telehealth: Payer: Self-pay

## 2018-09-11 NOTE — Telephone Encounter (Signed)

## 2018-09-12 ENCOUNTER — Other Ambulatory Visit (INDEPENDENT_AMBULATORY_CARE_PROVIDER_SITE_OTHER): Payer: Medicare Other

## 2018-09-12 DIAGNOSIS — E039 Hypothyroidism, unspecified: Secondary | ICD-10-CM | POA: Diagnosis not present

## 2018-09-12 NOTE — Addendum Note (Signed)
Addended by: Lynnea Ferrier on: 09/12/2018 07:46 AM   Modules accepted: Orders

## 2018-09-13 LAB — TSH: TSH: 5.12 u[IU]/mL — ABNORMAL HIGH (ref 0.450–4.500)

## 2018-09-15 ENCOUNTER — Encounter: Payer: Self-pay | Admitting: Family Medicine

## 2018-09-15 ENCOUNTER — Other Ambulatory Visit: Payer: Self-pay

## 2018-09-15 ENCOUNTER — Ambulatory Visit (INDEPENDENT_AMBULATORY_CARE_PROVIDER_SITE_OTHER): Payer: Medicare Other | Admitting: Family Medicine

## 2018-09-15 VITALS — BP 128/70 | HR 62 | Ht 61.0 in | Wt 145.0 lb

## 2018-09-15 DIAGNOSIS — E039 Hypothyroidism, unspecified: Secondary | ICD-10-CM | POA: Diagnosis not present

## 2018-09-15 DIAGNOSIS — Z8542 Personal history of malignant neoplasm of other parts of uterus: Secondary | ICD-10-CM | POA: Diagnosis not present

## 2018-09-15 DIAGNOSIS — E559 Vitamin D deficiency, unspecified: Secondary | ICD-10-CM | POA: Diagnosis not present

## 2018-09-15 LAB — VITAMIN D 25 HYDROXY (VIT D DEFICIENCY, FRACTURES): VITD: 22.02 ng/mL — ABNORMAL LOW (ref 30.00–100.00)

## 2018-09-15 MED ORDER — LEVOTHYROXINE SODIUM 75 MCG PO TABS: 75.0000 ug | ORAL_TABLET | Freq: Every day | ORAL | 0 refills | Status: DC
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
## 2018-09-16 MED ORDER — VITAMIN D (ERGOCALCIFEROL) 1.25 MG (50000 UNIT) PO CAPS: 50000.0000 [IU] | ORAL_CAPSULE | ORAL | 6 refills | Status: DC

## 2018-09-16 NOTE — Addendum Note (Signed)
Addended by: Jon Billings on: 09/16/2018 08:40 AM   Modules accepted: Orders

## 2018-09-17 LAB — CA 125: CA 125: 23 U/mL (ref <35)

## 2018-10-07 ENCOUNTER — Telehealth: Payer: Self-pay | Admitting: Family Medicine

## 2018-10-07 NOTE — Telephone Encounter (Signed)

## 2018-10-08 ENCOUNTER — Ambulatory Visit (INDEPENDENT_AMBULATORY_CARE_PROVIDER_SITE_OTHER): Payer: Medicare Other

## 2018-10-08 ENCOUNTER — Other Ambulatory Visit: Payer: Self-pay

## 2018-10-08 ENCOUNTER — Encounter: Payer: Self-pay | Admitting: Family Medicine

## 2018-10-08 DIAGNOSIS — Z23 Encounter for immunization: Secondary | ICD-10-CM | POA: Diagnosis not present

## 2018-10-08 NOTE — Progress Notes (Signed)
Pt came into the office to receive her flu shot. Vaccine given in the left deltoid, pt tolerated injection well. No signs/symptoms of a reaction prior to leaving the office. VIS given to pt.

## 2018-10-14 DIAGNOSIS — H401133 Primary open-angle glaucoma, bilateral, severe stage: Secondary | ICD-10-CM | POA: Diagnosis not present

## 2018-10-17 DIAGNOSIS — M1711 Unilateral primary osteoarthritis, right knee: Secondary | ICD-10-CM | POA: Diagnosis not present

## 2018-10-17 DIAGNOSIS — M25561 Pain in right knee: Secondary | ICD-10-CM | POA: Diagnosis not present

## 2018-11-10 ENCOUNTER — Other Ambulatory Visit (INDEPENDENT_AMBULATORY_CARE_PROVIDER_SITE_OTHER): Payer: Medicare Other

## 2018-11-10 ENCOUNTER — Other Ambulatory Visit: Payer: Self-pay

## 2018-11-10 DIAGNOSIS — E039 Hypothyroidism, unspecified: Secondary | ICD-10-CM | POA: Diagnosis not present

## 2018-11-11 ENCOUNTER — Ambulatory Visit (INDEPENDENT_AMBULATORY_CARE_PROVIDER_SITE_OTHER): Payer: Medicare Other | Admitting: Podiatry

## 2018-11-11 ENCOUNTER — Other Ambulatory Visit: Payer: Self-pay

## 2018-11-11 DIAGNOSIS — I739 Peripheral vascular disease, unspecified: Secondary | ICD-10-CM

## 2018-11-11 DIAGNOSIS — M79674 Pain in right toe(s): Secondary | ICD-10-CM | POA: Diagnosis not present

## 2018-11-11 DIAGNOSIS — B351 Tinea unguium: Secondary | ICD-10-CM | POA: Diagnosis not present

## 2018-11-11 DIAGNOSIS — T451X5A Adverse effect of antineoplastic and immunosuppressive drugs, initial encounter: Secondary | ICD-10-CM

## 2018-11-11 DIAGNOSIS — M79675 Pain in left toe(s): Secondary | ICD-10-CM | POA: Diagnosis not present

## 2018-11-11 DIAGNOSIS — G620 Drug-induced polyneuropathy: Secondary | ICD-10-CM

## 2018-11-11 LAB — TSH: TSH: 2.09 u[IU]/mL (ref 0.35–4.50)

## 2018-11-12 ENCOUNTER — Encounter: Payer: Self-pay | Admitting: Podiatry

## 2018-11-12 NOTE — Progress Notes (Signed)
  Subjective:  Patient ID: Katie [name], female    DOB: 10-19-1940,  MRN: [id number]  Chief Complaint  Patient presents with  . Nail Problem    pt is here for a nail trim    78 y.o. female returns for the above complaint. She states that her toenails have been bothering her Jerold Coombe.  She denies taking care of them.  She is not a diabetic.  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  She does have a mild component of peripheral vascular disease.  Objective:  Podiatric Exam: Vascular: dorsalis pedis and posterior tibial pulses are diminished bilateral. Capillary return is immediate. Temperature gradient is WNL. Skin turgor WNL  Sensorium: Normal Semmes Weinstein monofilament test. Normal tactile sensation bilaterally. Nail Exam: Pt has thick disfigured discolored nails with subungual debris noted bilateral entire nail hallux through fifth toenails Ulcer Exam: There is no evidence of ulcer or pre-ulcerative changes or infection. Orthopedic Exam: Muscle tone and strength are WNL. No limitations in general ROM. No crepitus or effusions noted. Foot type and digits show no abnormalities. HAV 1st MPJ left foot.  Skin: No Porokeratosis. No infection or ulcers  Assessment & Plan:  Patient was evaluated and treated and all questions answered.  Onychomycosis with pain  -Nails palliatively debrided as below. -Educated on self-care  Procedure: Nail Debridement Rationale: pain  Type of Debridement: manual, sharp debridement. Instrumentation: Nail nipper, rotary burr. Number of Nails: 10     No follow-ups on file.

## 2018-11-24 ENCOUNTER — Other Ambulatory Visit (HOSPITAL_BASED_OUTPATIENT_CLINIC_OR_DEPARTMENT_OTHER): Payer: Self-pay | Admitting: Family Medicine

## 2018-11-24 DIAGNOSIS — Z1231 Encounter for screening mammogram for malignant neoplasm of breast: Secondary | ICD-10-CM

## 2018-12-03 DIAGNOSIS — M25561 Pain in right knee: Secondary | ICD-10-CM | POA: Diagnosis not present

## 2018-12-03 DIAGNOSIS — M1711 Unilateral primary osteoarthritis, right knee: Secondary | ICD-10-CM | POA: Diagnosis not present

## 2018-12-03 DIAGNOSIS — M25661 Stiffness of right knee, not elsewhere classified: Secondary | ICD-10-CM | POA: Diagnosis not present

## 2018-12-10 ENCOUNTER — Telehealth: Payer: Self-pay

## 2018-12-10 DIAGNOSIS — Z8542 Personal history of malignant neoplasm of other parts of uterus: Secondary | ICD-10-CM

## 2018-12-10 NOTE — Telephone Encounter (Signed)
Patient called to schedule her yearly chest XRay. Called imaging and patient can arrive at 2pm still and have chest xray and mammogram all done at same time. Kathrene Alu RN  Left message for patient to return call to get her appointment.

## 2018-12-11 NOTE — Telephone Encounter (Signed)
Patient made aware of appointment and that she is able to get xray same day as mammogram. Kathrene Alu RN

## 2018-12-15 ENCOUNTER — Ambulatory Visit: Payer: Medicare Other | Admitting: Family Medicine

## 2018-12-16 ENCOUNTER — Ambulatory Visit (INDEPENDENT_AMBULATORY_CARE_PROVIDER_SITE_OTHER): Payer: Medicare Other | Admitting: Family Medicine

## 2018-12-16 ENCOUNTER — Other Ambulatory Visit: Payer: Self-pay

## 2018-12-16 ENCOUNTER — Encounter: Payer: Self-pay | Admitting: Family Medicine

## 2018-12-16 VITALS — BP 140/78 | HR 71 | Temp 98.5°F | Wt 143.6 lb

## 2018-12-16 DIAGNOSIS — M1711 Unilateral primary osteoarthritis, right knee: Secondary | ICD-10-CM | POA: Diagnosis not present

## 2018-12-16 DIAGNOSIS — E559 Vitamin D deficiency, unspecified: Secondary | ICD-10-CM | POA: Diagnosis not present

## 2018-12-16 DIAGNOSIS — Z01818 Encounter for other preprocedural examination: Secondary | ICD-10-CM | POA: Diagnosis not present

## 2018-12-16 DIAGNOSIS — E039 Hypothyroidism, unspecified: Secondary | ICD-10-CM

## 2018-12-16 DIAGNOSIS — Z23 Encounter for immunization: Secondary | ICD-10-CM | POA: Diagnosis not present

## 2018-12-16 LAB — CBC
HCT: 36.7 % (ref 36.0–46.0)
Hemoglobin: 12.2 g/dL (ref 12.0–15.0)
MCHC: 33.2 g/dL (ref 30.0–36.0)
MCV: 90.8 fl (ref 78.0–100.0)
Platelets: 320 10*3/uL (ref 150.0–400.0)
RBC: 4.04 Mil/uL (ref 3.87–5.11)
RDW: 13.5 % (ref 11.5–15.5)
WBC: 6.1 10*3/uL (ref 4.0–10.5)

## 2018-12-16 LAB — BASIC METABOLIC PANEL
BUN: 17 mg/dL (ref 6–23)
CO2: 27 mEq/L (ref 19–32)
Calcium: 9.3 mg/dL (ref 8.4–10.5)
Chloride: 101 mEq/L (ref 96–112)
Creatinine, Ser: 0.72 mg/dL (ref 0.40–1.20)
GFR: 77.91 mL/min (ref >60.00)
Glucose, Bld: 127 mg/dL — ABNORMAL HIGH (ref 70–99)
Potassium: 3.7 mEq/L (ref 3.5–5.1)
Sodium: 135 mEq/L (ref 135–145)

## 2018-12-16 LAB — VITAMIN D 25 HYDROXY (VIT D DEFICIENCY, FRACTURES): VITD: 41.99 ng/mL (ref 30.00–100.00)

## 2018-12-16 MED ORDER — LEVOTHYROXINE SODIUM 75 MCG PO TABS: 75.0000 ug | ORAL_TABLET | Freq: Every day | ORAL | 3 refills | Status: DC

## 2018-12-16 NOTE — Progress Notes (Signed)
Established Patient Office Visit  Subjective:  Patient ID: Katie [name], female    DOB: 09-Apr-1940  Age: 78 y.o. MRN: [id number]  CC:  Chief Complaint  Patient presents with  . Presurgical Clearance    Pt is here today for a presurgical clearance.  She will need an EKG.  She agrees to get her PNV-23 vaccine. She is having a TKR on right 11.30.20    HPI [place] presents for follow-up of her hypothyroidism, osteoporosis vitamin D deficiency and presurgical clearance.  Patient has been taking her high-dose vitamin D weekly supplementing with 1200 mg of calcium and taking her Fosamax weekly.  She is having no issues taking these medications.  She has been exercising.  Continues to take her Synthroid on a fasting state.  TSH last month was normal.  She is planning on having a right total knee replacement next week and is in the process of placing her husband in assisted living while she convalesces and possibly beyond.  She has been stressed caring for him over these past few years.   Past Medical History:  Diagnosis Date  . Arthritis   . Cancer (HCC)    Endometrial  . Glaucoma   . Thyroid disease     Past Surgical History:  Procedure Laterality Date  . ABDOMINAL HYSTERECTOMY    . APPENDECTOMY    . BREAST EXCISIONAL BIOPSY Right    benign, was a cyst  . BREAST SURGERY    . CHOLECYSTECTOMY    . FEMUR SURGERY  06/2013    Family History  Problem Relation Age of Onset  . Heart disease Father   . Hearing loss Maternal Grandmother   . Hearing loss Maternal Grandfather   . Cancer Paternal Grandfather   . Cancer Sister     Social History   Socioeconomic History  . Marital status: Married    Spouse name: Not on file  . Number of children: Not on file  . Years of education: Not on file  . Highest education level: Not on file  Occupational History  . Not on file  Social Needs  . Financial resource strain: Not on file  . Food insecurity    Worry: Not on file     Inability: Not on file  . Transportation needs    Medical: Not on file    Non-medical: Not on file  Tobacco Use  . Smoking status: Never Smoker  . Smokeless tobacco: Never Used  Substance and Sexual Activity  . Alcohol use: No    Frequency: Never  . Drug use: No  . Sexual activity: Not on file  Lifestyle  . Physical activity    Days per week: Not on file    Minutes per session: Not on file  . Stress: Not on file  Relationships  . Social Herbalist on phone: Not on file    Gets together: Not on file    Attends religious service: Not on file    Active member of club or organization: Not on file    Attends meetings of clubs or organizations: Not on file    Relationship status: Not on file  . Intimate partner violence    Fear of current or ex partner: Not on file    Emotionally abused: Not on file    Physically abused: Not on file    Forced sexual activity: Not on file  Other Topics Concern  . Not on file  Social History Narrative  .  Not on file    Outpatient Medications Prior to Visit  Medication Sig Dispense Refill  . alendronate (FOSAMAX) 70 MG tablet TAKE 1 TABLET BY MOUTH EVERY 7 DAYS. TAKE WITH A FULL GLASS OF WATER ON AN EMPTY STOMACH 4 tablet 11  . bimatoprost (LUMIGAN) 0.01 % SOLN 1 drop at bedtime.    Marland Kitchen BIOTIN PO Take 1 tablet by mouth daily.    . brimonidine (ALPHAGAN P) 0.1 % SOLN Use in the morning and evening.    . Calcium Citrate-Vitamin D (CALCIUM CITRATE + D PO) Take by mouth 2 (two) times daily.    . cholestyramine (QUESTRAN) 4 g packet MIX AND DRINK 1 PACKET(4 GRAMS) BY MOUTH TWICE DAILY 180 packet 1  . esomeprazole (NEXIUM) 20 MG capsule Take 20 mg by mouth daily at 12 noon.    . pyridOXINE (VITAMIN B-6) 100 MG tablet Take 100 mg by mouth daily.    . Vitamin D, Ergocalciferol, (DRISDOL) 1.25 MG (50000 UT) CAPS capsule Take 1 capsule (50,000 Units total) by mouth every 7 (seven) days. 5 capsule 6  . levothyroxine (SYNTHROID) 75 MCG tablet  Take 1 tablet (75 mcg total) by mouth daily. 90 tablet 0  . triamcinolone ointment (KENALOG) 0.5 % Apply 1 application topically 2 (two) times daily. For rash on arms and hands only. 30 g 1   No facility-administered medications prior to visit.     Allergies  Allergen Reactions  . Sulfa Antibiotics Rash  . Gabapentin   . Lyrica [Pregabalin]     ROS Review of Systems  Constitutional: Negative.   HENT: Negative.   Eyes: Negative for photophobia and visual disturbance.  Respiratory: Negative.   Cardiovascular: Negative.   Gastrointestinal: Negative.   Endocrine: Negative for polyphagia and polyuria.  Genitourinary: Negative.   Musculoskeletal: Positive for arthralgias and gait problem.  Skin: Negative for pallor and rash.  Allergic/Immunologic: Negative for immunocompromised state.  Neurological: Negative for light-headedness and headaches.  Hematological: Does not bruise/bleed easily.  Psychiatric/Behavioral: Negative.       Objective:    Physical Exam  Constitutional: She is oriented to person, place, and time. She appears well-developed and well-nourished. No distress.  HENT:  Head: Normocephalic and atraumatic.  Right Ear: External ear normal.  Left Ear: External ear normal.  Eyes: Conjunctivae are normal. Right eye exhibits no discharge. Left eye exhibits no discharge. No scleral icterus.  Neck: No JVD present. No tracheal deviation present.  Cardiovascular: Normal rate, regular rhythm and normal heart sounds.  Pulmonary/Chest: Effort normal and breath sounds normal. No stridor.  Neurological: She is alert and oriented to person, place, and time.  Skin: Skin is warm and dry. She is not diaphoretic.  Psychiatric: She has a normal mood and affect. Her behavior is normal.    BP 140/78 (BP Location: Left Arm, Patient Position: Sitting, Cuff Size: Normal)   Pulse 71   Temp 98.5 F (36.9 C) (Oral)   Wt 143 lb 9.6 oz (65.1 kg)   SpO2 95%   BMI 27.13 kg/m  Wt  Readings from Last 3 Encounters:  12/16/18 143 lb 9.6 oz (65.1 kg)  09/15/18 145 lb (65.8 kg)  08/28/18 142 lb 6 oz (64.6 kg)     Health Maintenance Due  Topic Date Due  . PNA vac Low Risk Adult (2 of 2 - PPSV23) 01/08/2012    There are no preventive care reminders to display for this patient.  Lab Results  Component Value Date   TSH 2.09 11/10/2018  Lab Results  Component Value Date   WBC 6.3 03/17/2018   HGB 12.5 03/17/2018   HCT 37.8 03/17/2018   MCV 91.1 03/17/2018   PLT 281.0 03/17/2018   Lab Results  Component Value Date   NA 135 03/17/2018   K 4.0 03/17/2018   CO2 24 03/17/2018   GLUCOSE 84 03/17/2018   BUN 21 03/17/2018   CREATININE 0.83 03/17/2018   BILITOT 0.5 03/17/2018   ALKPHOS 36 (L) 03/17/2018   AST 21 03/17/2018   ALT 16 03/17/2018   PROT 6.8 03/17/2018   ALBUMIN 4.0 03/17/2018   CALCIUM 9.4 03/17/2018   GFR 66.24 03/17/2018   No results found for: CHOL No results found for: HDL No results found for: LDLCALC No results found for: TRIG No results found for: CHOLHDL No results found for: HGBA1C    Assessment & Plan:   Problem List Items Addressed This Visit      Endocrine   Acquired hypothyroidism   Relevant Medications   levothyroxine (SYNTHROID) 75 MCG tablet   Other Relevant Orders   CBC   Basic Metabolic Panel (BMET)     Musculoskeletal and Integument   Osteoarthritis of right knee     Other   Vitamin D deficiency   Relevant Orders   VITAMIN D 25 Hydroxy (Vit-D Deficiency, Fractures)    Other Visit Diagnoses    Encounter for preoperative examination for general surgical procedure    -  Primary   Relevant Orders   EKG 12-Lead (Completed)   Need for pneumococcal vaccination       Relevant Orders   Pneumococcal polysaccharide vaccine 23-valent greater than or equal to 2yo subcutaneous/IM      Meds ordered this encounter  Medications  . levothyroxine (SYNTHROID) 75 MCG tablet    Sig: Take 1 tablet (75 mcg total) by  mouth daily.    Dispense:  90 tablet    Refill:  3    Follow-up: Return 6 weeks after surgery.   Today looks great.  Patient will follow-up with me about 6 weeks after surgery.  Patient is cleared for surgery.   Libby Maw, MD

## 2018-12-17 NOTE — H&P (Signed)
TOTAL KNEE ADMISSION H&P  Patient is being admitted for right total knee arthroplasty.  Subjective:  Chief Complaint:right knee pain.  HPI: [name], 78 y.o. female, has a history of pain and functional disability in the right knee due to arthritis and has failed non-surgical conservative treatments for greater than 12 weeks to includeNSAID's and/or analgesics, corticosteriod injections, viscosupplementation injections and activity modification.  Onset of symptoms was gradual, starting 10 years ago with gradually worsening course since that time. The patient noted no past surgery on the right knee(s).  Patient currently rates pain in the right knee(s) at 5 out of 10 with activity. Patient has worsening of pain with activity and weight bearing and pain that interferes with activities of daily living.  Patient has evidence of medial and patellofemoral bone-on-bone arthritis with varus deformity by imaging studies.  There is no active infection.  Patient Active Problem List   Diagnosis Date Noted  . Vitamin D deficiency 09/15/2018  . Pain due to onychomycosis of toenails of both feet 08/05/2018  . Osteoporosis without current pathological fracture 03/17/2018  . Healthcare maintenance 03/17/2018  . Elevated LDL cholesterol level 03/17/2018  . History of total hip arthroplasty, left 01/23/2018  . Cellulitis of right upper extremity 09/26/2017  . Left flank pain 08/26/2017  . OA (osteoarthritis) of knee 06/03/2017  . Loose stools 05/30/2017  . Chronic diarrhea 05/30/2017  . 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 Osteoarthritis of right knee 05/27/2017  . Labyrinthitis 04/18/2017  . Need for assistance due to unsteady gait 04/18/2017  . Age-related osteoporosis without current pathological fracture 01/23/2017  . Acquired hypothyroidism 01/23/2017  . History of endometrial cancer 01/23/2017   Past Medical History:  Diagnosis Date  . Arthritis   . Cancer (HCC)    Endometrial  .  Glaucoma   . Thyroid disease     Past Surgical History:  Procedure Laterality Date  . ABDOMINAL HYSTERECTOMY    . APPENDECTOMY    . BREAST EXCISIONAL BIOPSY Right    benign, was a cyst  . BREAST SURGERY    . CHOLECYSTECTOMY    . FEMUR SURGERY  06/2013    No current facility-administered medications for this encounter.    Current Outpatient Medications  Medication Sig Dispense Refill Last Dose  . alendronate (FOSAMAX) 70 MG tablet TAKE 1 TABLET BY MOUTH EVERY 7 DAYS. TAKE WITH A FULL GLASS OF WATER ON AN EMPTY STOMACH 4 tablet 11 Taking  . bimatoprost (LUMIGAN) 0.01 % SOLN 1 drop at bedtime.   Taking  . BIOTIN PO Take 1 tablet by mouth daily.   Taking  . brimonidine (ALPHAGAN P) 0.1 % SOLN Use in the morning and evening.   Taking  . Calcium Citrate-Vitamin D (CALCIUM CITRATE + D PO) Take by mouth 2 (two) times daily.   Taking  . cholestyramine (QUESTRAN) 4 g packet MIX AND DRINK 1 PACKET(4 GRAMS) BY MOUTH TWICE DAILY 180 packet 1 Taking  . esomeprazole (NEXIUM) 20 MG capsule Take 20 mg by mouth daily at 12 noon.   Taking  . levothyroxine (SYNTHROID) 75 MCG tablet Take 1 tablet (75 mcg total) by mouth daily. 90 tablet 3   . pyridOXINE (VITAMIN B-6) 100 MG tablet Take 100 mg by mouth daily.   Taking  . Vitamin D, Ergocalciferol, (DRISDOL) 1.25 MG (50000 UT) CAPS capsule Take 1 capsule (50,000 Units total) by mouth every 7 (seven) days. 5 capsule 6 Taking   Allergies  Allergen Reactions  . Sulfa Antibiotics Rash  .  Gabapentin   . Lyrica [Pregabalin]     Social History   Tobacco Use  . Smoking status: Never Smoker  . Smokeless tobacco: Never Used  Substance Use Topics  . Alcohol use: No    Frequency: Never    Family History  Problem Relation Age of Onset  . Heart disease Father   . Hearing loss Maternal Grandmother   . Hearing loss Maternal Grandfather   . Cancer Paternal Grandfather   . Cancer Sister      Review of Systems  Constitutional: Negative for chills and  fever.  Respiratory: Negative for cough and shortness of breath.   Cardiovascular: Negative for chest pain and palpitations.  Gastrointestinal: Negative for nausea and vomiting.  Musculoskeletal: Positive for joint pain.    Objective:  Physical Exam Patient is an 78 year old female.  Well nourished and well developed. General: Alert and oriented x3, cooperative and pleasant, no acute distress. Head: normocephalic, atraumatic, neck supple. Eyes: EOMI. Respiratory: breath sounds clear in all fields, no wheezing, rales, or rhonchi. Cardiovascular: Regular rate and rhythm, no murmurs, gallops or rubs. Abdomen: non-tender to palpation and soft, normoactive bowel sounds.  Musculoskeletal: Bilateral Hip Exam: ROM: Normal without discomfort. There is no tenderness over the greater trochanter bursa.  Right Knee Exam: No effusion. Range of motion is 5-125 degrees. Marked crepitus on range of motion of the knee. Medial joint line tenderness. No lateral joint line tenderness. Stable knee.  Left Knee Exam: No effusion. Range of motion is normal. No crepitus on range of motion of the knee. No medial joint line tenderness No lateral joint line tenderness. Stable knee.  Calves soft and nontender. Motor function intact in LE. Strength 5/5 LE bilaterally. Neuro: Distal pulses 2+. Sensation to light touch intact in LE.  Vital signs in last 24 hours: Temp:  [98.5 F (36.9 C)] 98.5 F (36.9 C) (11/10 1329) Pulse Rate:  [71] 71 (11/10 1329) BP: (140)/(78) 140/78 (11/10 1329) SpO2:  [95 %] 95 % (11/10 1329) Weight:  [65.1 kg] 65.1 kg (11/10 1329)  Labs:   Estimated body mass index is 27.13 kg/m as calculated from the following:   Height as of 09/15/18: 5\' 1"  (1.549 m).   Weight as of 12/16/18: 65.1 kg.   Imaging Review Plain radiographs demonstrate severe degenerative joint disease of the right knee(s). The overall alignment ismild varus. The bone quality appears to be adequate  for age and reported activity level.  Assessment/Plan:  End stage arthritis, right knee   The patient history, physical examination, clinical judgment of the provider and imaging studies are consistent with end stage degenerative joint disease of the right knee(s) and total knee arthroplasty is deemed medically necessary. The treatment options including medical management, injection therapy arthroscopy and arthroplasty were discussed at length. The risks and benefits of total knee arthroplasty were presented and reviewed. The risks due to aseptic loosening, infection, stiffness, patella tracking problems, thromboembolic complications and other imponderables were discussed. The patient acknowledged the explanation, agreed to proceed with the plan and consent was signed. Patient is being admitted for inpatient treatment for surgery, pain control, PT, OT, prophylactic antibiotics, VTE prophylaxis, progressive ambulation and ADL's and discharge planning. The patient is planning to be discharged home.  Therapy Plans: outpatient therapy at [facility] Disposition: Home with daughters Planned DVT Prophylaxis: aspirin 325mg  BID DME needed: walker PCP: Dr. Abelino Derrick, appointment 11/10 TXA: IV Allergies: sulfa - hives, wasp venom - itching Anesthesia Concerns: slow to wake up, vomiting BMI: 27.3  Other: Prefers minimal narcotic pain medication, gets drowsy. *Daughter Pensions consultant Doylene Canning) is power of attorney.* Hx of endometrial cancer 10 years ago, s/p hysterectomy.  Patient's anticipated LOS is less than 2 midnights, meeting these requirements: - Lives within 1 hour of care - Has a competent adult at home to recover with post-op recover - NO history of  - Chronic pain requiring opiods  - Diabetes  - Coronary Artery Disease  - Heart failure  - Heart attack  - Stroke  - DVT/VTE  - Cardiac arrhythmia  - Respiratory Failure/COPD  - Renal failure  - Anemia  - Advanced Liver disease  - Patient  was instructed on what medications to stop prior to surgery. - Follow-up visit in 2 weeks with Dr. Wynelle Link - Begin physical therapy following surgery - Pre-operative lab work as pre-surgical testing - Prescriptions will be provided in hospital at time of discharge  Griffith Citron, PA-C Orthopedic Surgery [facility] [place] [phone]

## 2018-12-19 ENCOUNTER — Other Ambulatory Visit: Payer: Self-pay | Admitting: Gastroenterology

## 2018-12-23 ENCOUNTER — Ambulatory Visit (HOSPITAL_BASED_OUTPATIENT_CLINIC_OR_DEPARTMENT_OTHER): Payer: Medicare Other

## 2018-12-25 ENCOUNTER — Encounter: Payer: Self-pay | Admitting: Obstetrics & Gynecology

## 2018-12-25 ENCOUNTER — Ambulatory Visit (INDEPENDENT_AMBULATORY_CARE_PROVIDER_SITE_OTHER): Payer: Medicare Other | Admitting: Obstetrics & Gynecology

## 2018-12-25 ENCOUNTER — Ambulatory Visit: Payer: Medicare Other | Admitting: Obstetrics & Gynecology

## 2018-12-25 ENCOUNTER — Other Ambulatory Visit: Payer: Self-pay

## 2018-12-25 ENCOUNTER — Ambulatory Visit (HOSPITAL_BASED_OUTPATIENT_CLINIC_OR_DEPARTMENT_OTHER)
Admission: RE | Admit: 2018-12-25 | Discharge: 2018-12-25 | Disposition: A | Discharge status: Home / Self Care | Payer: Medicare Other | Source: Ambulatory Visit | Attending: Obstetrics & Gynecology | Admitting: Obstetrics & Gynecology

## 2018-12-25 ENCOUNTER — Ambulatory Visit (HOSPITAL_BASED_OUTPATIENT_CLINIC_OR_DEPARTMENT_OTHER)
Admission: RE | Admit: 2018-12-25 | Discharge: 2018-12-25 | Disposition: A | Discharge status: Home / Self Care | Payer: Medicare Other | Source: Ambulatory Visit | Attending: Family Medicine | Admitting: Family Medicine

## 2018-12-25 VITALS — BP 150/81 | HR 67 | Ht 61.0 in | Wt 141.1 lb

## 2018-12-25 DIAGNOSIS — Z8542 Personal history of malignant neoplasm of other parts of uterus: Secondary | ICD-10-CM

## 2018-12-25 DIAGNOSIS — Z1231 Encounter for screening mammogram for malignant neoplasm of breast: Secondary | ICD-10-CM | POA: Insufficient documentation

## 2018-12-25 DIAGNOSIS — C541 Malignant neoplasm of endometrium: Secondary | ICD-10-CM | POA: Diagnosis not present

## 2018-12-25 DIAGNOSIS — Z01419 Encounter for gynecological examination (general) (routine) without abnormal findings: Secondary | ICD-10-CM | POA: Diagnosis not present

## 2018-12-25 NOTE — Progress Notes (Signed)
Subjective:     Katie [name] is a 78 y.o. female here for a routine exam. P2 Current complaints: no GYN problems. Pt is having a right total knee replacement on 01/05/2019. No bleeding or GYN issues.   Her CT scan was neg 07/11/2017    Gynecologic History No LMP recorded. Patient has had a hysterectomy. Contraception: status post hysterectomy Last Pap: s/p hyst Last mammogram: 12/25/2018. Results were: pending  Obstetric History OB History  Gravida Para Term Preterm AB Living  2 2 2     2   SAB TAB Ectopic Multiple Live Births          2    # Outcome Date GA Lbr Len/2nd Weight Sex Delivery Anes PTL Lv  2 Term 1965 [redacted]w[redacted]d  7 lb 10 oz (3.459 kg) F Vag-Spont EPI  LIV  1 Term 1963 [redacted]w[redacted]d  8 lb 3 oz (3.714 kg) F Vag-Spont EPI  LIV   The following portions of the patient's history were reviewed and updated as appropriate: allergies, current medications, past family history, past medical history, past social history, past surgical history and problem list.  Review of Systems Pertinent items are noted in HPI.    Objective:  BP (!) 150/81   Pulse 67   Ht 5\' 1"  (1.549 m)   Wt 141 lb 1.9 oz (64 kg)   BMI 26.66 kg/m  General Appearance:    Alert, cooperative, no distress, appears stated age  Head:    Normocephalic, without obvious abnormality, atraumatic  Eyes:    conjunctiva/corneas clear, EOM's intact, both eyes  Ears:    Normal external ear canals, both ears  Nose:   Nares normal, septum midline, mucosa normal, no drainage    or sinus tenderness  Throat:   Lips, mucosa, and tongue normal; teeth and gums normal  Neck:   Supple, symmetrical, trachea midline, no adenopathy;    thyroid:  no enlargement/tenderness/nodules  Back:     Symmetric, no curvature, ROM normal, no CVA tenderness  Lungs:     respirations unlabored  Chest Wall:    No tenderness or deformity   Heart:    Regular rate and rhythm  Breast Exam:    No tenderness, masses, or nipple abnormality  Abdomen:     Soft,  non-tender, bowel sounds active all four quadrants,    no masses, no organomegaly  Genitalia:    Normal female without lesion, discharge or tenderness   The vagina is foreshortened due to radiation. The Rectovaginal exam is WNL- no masses. The septum is smooth and there are no ant masses.    Extremities:   Extremities normal, atraumatic, no cyanosis or edema  Pulses:   2+ and symmetric all extremities  Skin:   Skin color, texture, turgor normal, no rashes or lesions    Assessment:    Healthy female exam.   H/o uterine cancer   Plan:    Follow up in: 1 year.    Knee surgery on 01/05/2019  [name], M.D., [name]

## 2018-12-26 ENCOUNTER — Other Ambulatory Visit (HOSPITAL_COMMUNITY): Payer: Self-pay | Admitting: *Deleted

## 2018-12-26 NOTE — Patient Instructions (Addendum)
DUE TO COVID-19 ONLY ONE VISITOR IS ALLOWED TO COME WITH YOU AND STAY IN THE WAITING ROOM ONLY DURING PRE OP AND PROCEDURE DAY OF SURGERY. THE 1 VISITOR MAY VISIT WITH YOU AFTER SURGERY IN YOUR PRIVATE ROOM DURING VISITING HOURS ONLY!  YOU NEED TO HAVE A COVID 19 TEST ON 11-27-2020_______ @__0825  am_____, THIS TEST MUST BE DONE BEFORE SURGERY, COME  [address], [place] , [address].  ([place]) ONCE YOUR COVID TEST IS COMPLETED, PLEASE BEGIN THE QUARANTINE INSTRUCTIONS AS OUTLINED IN YOUR HANDOUT.                Katie [name]    Your procedure is scheduled on: 01-05-2019   Report to [facility] Main  Entrance    Report to [place] at  530 AM     Call this number if you have problems the morning of surgery [phone]    Remember:. BRUSH YOUR TEETH MORNING OF SURGERY AND RINSE YOUR MOUTH OUT, NO CHEWING GUM CANDY OR MINTS.   NO SOLID FOOD AFTER MIDNIGHT THE NIGHT PRIOR TO SURGERY. CLEAR LIQUIDS FROM MIDNIGHT UNTIL 415 AM. DRINK ENSURE DRINK AT 415 AM.   CLEAR LIQUID DIET   Foods Allowed                                                                     Foods Excluded  Coffee and tea, regular and decaf                             liquids that you cannot  Plain Jell-O any favor except red or purple                                           see through such as: Fruit ices (not with fruit pulp)                                     milk, soups, orange juice  Iced Popsicles                                    All solid food Carbonated beverages, regular and diet                                    Cranberry, grape and apple juices Sports drinks like Gatorade Lightly seasoned clear broth or consume(fat free) Sugar, honey syrup  Sample Menu Breakfast                                Lunch                                     Supper Cranberry juice  Beef broth                            Chicken broth Jell-O                                      Grape juice                           Apple juice Coffee or tea                        Jell-O                                      Popsicle                                                Coffee or tea                        Coffee or tea  _____________________________________________________________________     Take these medicines the morning of surgery with A SIP OF WATER: EYE DROP, LEVOTHYROXINE (SYNTHROID), NEXIUM                                 You may not have any metal on your body including hair pins and              piercings  Do not wear jewelry, make-up, lotions, powders or perfumes, deodorant             Do not wear nail polish on your fingernails.  Do not shave  48 hours prior to surgery.                Do not bring valuables to the hospital. [place].  Contacts, dentures or bridgework may not be worn into surgery.  Leave suitcase in the car. After surgery it may be brought to your room.                  Please read over the following fact sheets you were given: _____________________________________________________________________             [facility] - Preparing for Surgery Before surgery, you can play an important role.  Because skin is not sterile, your skin needs to be as free of germs as possible.  You can reduce the number of germs on your skin by washing with CHG (chlorahexidine gluconate) soap before surgery.  CHG is an antiseptic cleaner which kills germs and bonds with the skin to continue killing germs even after washing. Please DO NOT use if you have an allergy to CHG or antibacterial soaps.  If your skin becomes reddened/irritated stop using the CHG and inform your nurse when you arrive at Short Stay. Do not shave (including legs and underarms) for at least 48 hours prior to the first CHG shower.  You may shave  your face/neck. Please follow these instructions carefully:  1.  Shower with CHG Soap  the night before surgery and the  morning of Surgery.  2.  If you choose to wash your hair, wash your hair first as usual with your  normal  shampoo.  3.  After you shampoo, rinse your hair and body thoroughly to remove the  shampoo.                           4.  Use CHG as you would any other liquid soap.  You can apply chg directly  to the skin and wash                       Gently with a scrungie or clean washcloth.  5.  Apply the CHG Soap to your body ONLY FROM THE NECK DOWN.   Do not use on face/ open                           Wound or open sores. Avoid contact with eyes, ears mouth and genitals (private parts).                       Wash face,  Genitals (private parts) with your normal soap.             6.  Wash thoroughly, paying special attention to the area where your surgery  will be performed.  7.  Thoroughly rinse your body with warm water from the neck down.  8.  DO NOT shower/wash with your normal soap after using and rinsing off  the CHG Soap.                9.  Pat yourself dry with a clean towel.            10.  Wear clean pajamas.            11.  Place clean sheets on your bed the night of your first shower and do not  sleep with pets. Day of Surgery : Do not apply any lotions/deodorants the morning of surgery.  Please wear clean clothes to the hospital/surgery center.  FAILURE TO FOLLOW THESE INSTRUCTIONS MAY RESULT IN THE CANCELLATION OF YOUR SURGERY PATIENT SIGNATURE_________________________________  NURSE SIGNATURE__________________________________  ________________________________________________________________________   Katie [name]  An incentive spirometer is a tool that can help keep your lungs clear and active. This tool measures how well you are filling your lungs with each breath. Taking long deep breaths may help reverse or decrease the chance of developing breathing (pulmonary) problems (especially infection) following:  A long period of time when  you are unable to move or be active. BEFORE THE PROCEDURE   If the spirometer includes an indicator to show your best effort, your nurse or respiratory therapist will set it to a desired goal.  If possible, sit up straight or lean slightly forward. Try not to slouch.  Hold the incentive spirometer in an upright position. INSTRUCTIONS FOR USE  1. Sit on the edge of your bed if possible, or sit up as far as you can in bed or on a chair. 2. Hold the incentive spirometer in an upright position. 3. Breathe out normally. 4. Place the mouthpiece in your mouth and seal your lips tightly around it. 5. Breathe in slowly and as deeply as possible, raising the piston  or the ball toward the top of the column. 6. Hold your breath for 3-5 seconds or for as long as possible. Allow the piston or ball to fall to the bottom of the column. 7. Remove the mouthpiece from your mouth and breathe out normally. 8. Rest for a few seconds and repeat Steps 1 through 7 at least 10 times every 1-2 hours when you are awake. Take your time and take a few normal breaths between deep breaths. 9. The spirometer may include an indicator to show your best effort. Use the indicator as a goal to work toward during each repetition. 10. After each set of 10 deep breaths, practice coughing to be sure your lungs are clear. If you have an incision (the cut made at the time of surgery), support your incision when coughing by placing a pillow or rolled up towels firmly against it. Once you are able to get out of bed, walk around indoors and cough well. You may stop using the incentive spirometer when instructed by your caregiver.  RISKS AND COMPLICATIONS  Take your time so you do not get dizzy or light-headed.  If you are in pain, you may need to take or ask for pain medication before doing incentive spirometry. It is harder to take a deep breath if you are having pain. AFTER USE  Rest and breathe slowly and easily.  It can be  helpful to keep track of a log of your progress. Your caregiver can provide you with a simple table to help with this. If you are using the spirometer at home, follow these instructions: [place] IF:   You are having difficultly using the spirometer.  You have trouble using the spirometer as often as instructed.  Your pain medication is not giving enough relief while using the spirometer.  You develop fever of 100.5 F (38.1 C) or higher. SEEK IMMEDIATE MEDICAL CARE IF:   You cough up bloody sputum that had not been present before.  You develop fever of 102 F (38.9 C) or greater.  You develop worsening pain at or near the incision site. MAKE SURE YOU:   Understand these instructions.  Will watch your condition.  Will get help right away if you are not doing well or get worse. Document Released: 06/04/2006 Document Revised: 04/16/2011 Document Reviewed: 08/05/2006 Exit[employer] Patient Information 2014 [employer], [place].   ________________________________________________________________________  WHAT IS A BLOOD TRANSFUSION? Blood Transfusion Information  A transfusion is the replacement of blood or some of its parts. Blood is made up of multiple cells which provide different functions.  Red blood cells carry oxygen and are used for blood loss replacement.  White blood cells fight against infection.  Platelets control bleeding.  Plasma helps clot blood.  Other blood products are available for specialized needs, such as hemophilia or other clotting disorders. BEFORE THE TRANSFUSION  Who gives blood for transfusions?   Healthy volunteers who are fully evaluated to make sure their blood is safe. This is blood bank blood. Transfusion therapy is the safest it has ever been in the practice of medicine. Before blood is taken from a donor, a complete history is taken to make sure that person has no history of diseases nor engages in risky social behavior (examples are  intravenous drug use or sexual activity with multiple partners). The donor's travel history is screened to minimize risk of transmitting infections, such as malaria. The donated blood is tested for signs of infectious diseases, such as HIV and hepatitis.  The blood is then tested to be sure it is compatible with you in order to minimize the chance of a transfusion reaction. If you or a relative donates blood, this is often done in anticipation of surgery and is not appropriate for emergency situations. It takes many days to process the donated blood. RISKS AND COMPLICATIONS Although transfusion therapy is very safe and saves many lives, the main dangers of transfusion include:   Getting an infectious disease.  Developing a transfusion reaction. This is an allergic reaction to something in the blood you were given. Every precaution is taken to prevent this. The decision to have a blood transfusion has been considered carefully by your caregiver before blood is given. Blood is not given unless the benefits outweigh the risks. AFTER THE TRANSFUSION  Right after receiving a blood transfusion, you will usually feel much better and more energetic. This is especially true if your red blood cells have gotten low (anemic). The transfusion raises the level of the red blood cells which carry oxygen, and this usually causes an energy increase.  The nurse administering the transfusion will monitor you carefully for complications. HOME CARE INSTRUCTIONS  No special instructions are needed after a transfusion. You may find your energy is better. Speak with your caregiver about any limitations on activity for underlying diseases you may have. SEEK MEDICAL CARE IF:   Your condition is not improving after your transfusion.  You develop redness or irritation at the intravenous (IV) site. SEEK IMMEDIATE MEDICAL CARE IF:  Any of the following symptoms occur over the next 12 hours:  Shaking chills.  You have a  temperature by mouth above 102 F (38.9 C), not controlled by medicine.  Chest, back, or muscle pain.  People around you feel you are not acting correctly or are confused.  Shortness of breath or difficulty breathing.  Dizziness and fainting.  You get a rash or develop hives.  You have a decrease in urine output.  Your urine turns a dark color or changes to pink, red, or brown. Any of the following symptoms occur over the next 10 days:  You have a temperature by mouth above 102 F (38.9 C), not controlled by medicine.  Shortness of breath.  Weakness after normal activity.  The white part of the eye turns yellow (jaundice).  You have a decrease in the amount of urine or are urinating less often.  Your urine turns a dark color or changes to pink, red, or brown. Document Released: 01/20/2000 Document Revised: 04/16/2011 Document Reviewed: 09/08/2007 [facility] Patient Information 2014 [place], [place].  _______________________________________________________________________

## 2018-12-29 ENCOUNTER — Encounter (HOSPITAL_COMMUNITY)
Admission: RE | Admit: 2018-12-29 | Discharge: 2018-12-29 | Disposition: A | Discharge status: Home / Self Care | Payer: Medicare Other | Source: Ambulatory Visit | Attending: Orthopedic Surgery | Admitting: Orthopedic Surgery

## 2018-12-29 ENCOUNTER — Telehealth: Payer: Self-pay

## 2018-12-29 ENCOUNTER — Other Ambulatory Visit: Payer: Self-pay

## 2018-12-29 ENCOUNTER — Encounter (HOSPITAL_COMMUNITY): Payer: Self-pay

## 2018-12-29 DIAGNOSIS — M1711 Unilateral primary osteoarthritis, right knee: Secondary | ICD-10-CM | POA: Diagnosis not present

## 2018-12-29 DIAGNOSIS — Z01812 Encounter for preprocedural laboratory examination: Secondary | ICD-10-CM | POA: Insufficient documentation

## 2018-12-29 HISTORY — DX: Other complications of anesthesia, initial encounter: T88.59XA

## 2018-12-29 HISTORY — DX: Other specified postprocedural states: Z98.890

## 2018-12-29 HISTORY — DX: Family history of other specified conditions: Z84.89

## 2018-12-29 HISTORY — DX: Myoneural disorder, unspecified: G70.9

## 2018-12-29 HISTORY — DX: Nausea with vomiting, unspecified: R11.2

## 2018-12-29 HISTORY — DX: Hypothyroidism, unspecified: E03.9

## 2018-12-29 HISTORY — DX: Gastro-esophageal reflux disease without esophagitis: K21.9

## 2018-12-29 LAB — CBC WITH DIFFERENTIAL/PLATELET
Abs Immature Granulocytes: 0.01 10*3/uL (ref 0.00–0.07)
Basophils Absolute: 0 10*3/uL (ref 0.0–0.1)
Basophils Relative: 0 %
Eosinophils Absolute: 0.2 10*3/uL (ref 0.0–0.5)
Eosinophils Relative: 3 %
HCT: 39.4 % (ref 36.0–46.0)
Hemoglobin: 12.4 g/dL (ref 12.0–15.0)
Immature Granulocytes: 0 %
Lymphocytes Relative: 25 %
Lymphs Abs: 1.5 10*3/uL (ref 0.7–4.0)
MCH: 29.4 pg (ref 26.0–34.0)
MCHC: 31.5 g/dL (ref 30.0–36.0)
MCV: 93.4 fL (ref 80.0–100.0)
Monocytes Absolute: 0.5 10*3/uL (ref 0.1–1.0)
Monocytes Relative: 9 %
Neutro Abs: 3.8 10*3/uL (ref 1.7–7.7)
Neutrophils Relative %: 63 %
Platelets: 334 10*3/uL (ref 150–400)
RBC: 4.22 MIL/uL (ref 3.87–5.11)
RDW: 12.9 % (ref 11.5–15.5)
WBC: 6 10*3/uL (ref 4.0–10.5)
nRBC: 0 % (ref 0.0–0.2)

## 2018-12-29 LAB — ABO/RH: ABO/RH(D): A POS

## 2018-12-29 LAB — COMPREHENSIVE METABOLIC PANEL
ALT: 22 U/L (ref 0–44)
AST: 25 U/L (ref 15–41)
Albumin: 3.8 g/dL (ref 3.5–5.0)
Alkaline Phosphatase: 38 U/L (ref 38–126)
Anion gap: 9 (ref 5–15)
BUN: 13 mg/dL (ref 8–23)
CO2: 24 mmol/L (ref 22–32)
Calcium: 9.4 mg/dL (ref 8.9–10.3)
Chloride: 99 mmol/L (ref 98–111)
Creatinine, Ser: 0.72 mg/dL (ref 0.44–1.00)
GFR calc Af Amer: >60 mL/min (ref >60)
GFR calc non Af Amer: >60 mL/min (ref >60)
Glucose, Bld: 96 mg/dL (ref 70–99)
Potassium: 4.8 mmol/L (ref 3.5–5.1)
Sodium: 132 mmol/L — ABNORMAL LOW (ref 135–145)
Total Bilirubin: 0.6 mg/dL (ref 0.3–1.2)
Total Protein: 6.9 g/dL (ref 6.5–8.1)

## 2018-12-29 LAB — APTT: aPTT: 30 seconds (ref 24–36)

## 2018-12-29 LAB — PROTIME-INR
INR: 1 (ref 0.8–1.2)
Prothrombin Time: 12.7 seconds (ref 11.4–15.2)

## 2018-12-29 LAB — SURGICAL PCR SCREEN
MRSA, PCR: NEGATIVE
Staphylococcus aureus: NEGATIVE

## 2018-12-29 NOTE — Progress Notes (Addendum)
PCP - Dr.William Krystal Clark Cardiologist - N/A  Chest x-ray - 12/25/2018 EKG - 12/16/2018 Stress Test - N/A ECHO - N/A Cardiac Cath - N/A  Sleep Study - 15 years ago had Dx sleep apnea-used CPAP and then lost weight and no longer use it CPAP - no  Fasting Blood Sugar -n/a  Checks Blood Sugar ___0__ times a day  Blood Thinner Instructions:n/a Aspirin Instructions:n/a Last Dose:n/a  Anesthesia review:  Chart given to Konrad Felix, PA to review for elevated Blood pressure.  Patient denies shortness of breath, fever, cough and chest pain at PAT appointment   Patient verbalized understanding of instructions that were given to them at the PAT appointment. Patient was also instructed that they will need to review over the PAT instructions again at home before surgery.

## 2018-12-29 NOTE — Telephone Encounter (Signed)
-----   Message from Lavonia Drafts, MD sent at 12/29/2018  9:58 AM EST ----- Please call pt. Her CXR was WNL.   F/u in 1 year.   Thx,  Clh-S

## 2018-12-29 NOTE — Telephone Encounter (Signed)
Patient made aware that chest xray was normal. Kathrene Alu RN

## 2018-12-31 ENCOUNTER — Other Ambulatory Visit: Payer: Self-pay

## 2018-12-31 ENCOUNTER — Ambulatory Visit: Payer: Medicare Other | Admitting: Podiatry

## 2019-01-02 ENCOUNTER — Other Ambulatory Visit (HOSPITAL_COMMUNITY)
Admission: RE | Admit: 2019-01-02 | Discharge: 2019-01-02 | Disposition: A | Discharge status: Home / Self Care | Payer: Medicare Other | Source: Ambulatory Visit | Attending: Orthopedic Surgery | Admitting: Orthopedic Surgery

## 2019-01-02 DIAGNOSIS — Z20828 Contact with and (suspected) exposure to other viral communicable diseases: Secondary | ICD-10-CM | POA: Diagnosis not present

## 2019-01-02 DIAGNOSIS — Z01812 Encounter for preprocedural laboratory examination: Secondary | ICD-10-CM | POA: Insufficient documentation

## 2019-01-02 LAB — SARS CORONAVIRUS 2 (TAT 6-24 HRS): SARS Coronavirus 2: NEGATIVE

## 2019-01-02 NOTE — Anesthesia Preprocedure Evaluation (Addendum)
Anesthesia Evaluation  Patient identified by MRN, date of birth, ID band Patient awake    Reviewed: Allergy & Precautions, NPO status , Patient's Chart, lab work & pertinent test results  History of Anesthesia Complications (+) PONV and history of anesthetic complications  Airway Mallampati: II  TM Distance: >3 FB Neck ROM: Full    Dental no notable dental hx.    Pulmonary neg pulmonary ROS,    Pulmonary exam normal        Cardiovascular negative cardio ROS Normal cardiovascular exam     Neuro/Psych negative neurological ROS  negative psych ROS   GI/Hepatic Neg liver ROS, GERD  Controlled,  Endo/Other  Hypothyroidism   Renal/GU negative Renal ROS  negative genitourinary   Musculoskeletal  (+) Arthritis ,   Abdominal   Peds  Hematology negative hematology ROS (+)   Anesthesia Other Findings Day of surgery medications reviewed with patient.  Reproductive/Obstetrics negative OB ROS                            Anesthesia Physical Anesthesia Plan  ASA: II  Anesthesia Plan: Spinal   Post-op Pain Management:  Regional for Post-op pain   Induction:   PONV Risk Score and Plan: 4 or greater and Treatment may vary due to age or medical condition, Ondansetron, Propofol infusion and Dexamethasone  Airway Management Planned: Natural Airway and Simple Face Mask  Additional Equipment:   Intra-op Plan:   Post-operative Plan:   Informed Consent: I have reviewed the patients History and Physical, chart, labs and discussed the procedure including the risks, benefits and alternatives for the proposed anesthesia with the patient or authorized representative who has indicated his/her understanding and acceptance.     Dental advisory given  Plan Discussed with: CRNA  Anesthesia Plan Comments:        Anesthesia Quick Evaluation

## 2019-01-04 MED ORDER — BUPIVACAINE LIPOSOME 1.3 % IJ SUSP
20.0000 mL | INTRAMUSCULAR | Status: DC
  Filled 2019-01-04: qty 20

## 2019-01-05 ENCOUNTER — Other Ambulatory Visit: Payer: Self-pay

## 2019-01-05 ENCOUNTER — Encounter (HOSPITAL_COMMUNITY)
Admission: RE | Disposition: A | Discharge status: Home / Self Care | Payer: Self-pay | Source: Home / Self Care | Attending: Orthopedic Surgery

## 2019-01-05 ENCOUNTER — Inpatient Hospital Stay (HOSPITAL_COMMUNITY): Payer: Medicare Other | Admitting: Physician Assistant

## 2019-01-05 ENCOUNTER — Encounter (HOSPITAL_COMMUNITY): Payer: Self-pay | Admitting: *Deleted

## 2019-01-05 ENCOUNTER — Inpatient Hospital Stay (HOSPITAL_COMMUNITY): Payer: Medicare Other | Admitting: Anesthesiology

## 2019-01-05 ENCOUNTER — Observation Stay (HOSPITAL_COMMUNITY)
Admission: RE | Admit: 2019-01-05 | Discharge: 2019-01-06 | Disposition: A | Discharge status: Home / Self Care | Payer: Medicare Other | Attending: Orthopedic Surgery | Admitting: Orthopedic Surgery

## 2019-01-05 DIAGNOSIS — Z9221 Personal history of antineoplastic chemotherapy: Secondary | ICD-10-CM | POA: Diagnosis not present

## 2019-01-05 DIAGNOSIS — Z79899 Other long term (current) drug therapy: Secondary | ICD-10-CM | POA: Diagnosis not present

## 2019-01-05 DIAGNOSIS — Z20828 Contact with and (suspected) exposure to other viral communicable diseases: Secondary | ICD-10-CM | POA: Diagnosis not present

## 2019-01-05 DIAGNOSIS — G709 Myoneural disorder, unspecified: Secondary | ICD-10-CM | POA: Insufficient documentation

## 2019-01-05 DIAGNOSIS — J984 Other disorders of lung: Secondary | ICD-10-CM | POA: Insufficient documentation

## 2019-01-05 DIAGNOSIS — I700 Atherosclerosis of aorta: Secondary | ICD-10-CM | POA: Diagnosis not present

## 2019-01-05 DIAGNOSIS — Z79890 Hormone replacement therapy (postmenopausal): Secondary | ICD-10-CM | POA: Insufficient documentation

## 2019-01-05 DIAGNOSIS — E039 Hypothyroidism, unspecified: Secondary | ICD-10-CM | POA: Diagnosis not present

## 2019-01-05 DIAGNOSIS — M179 Osteoarthritis of knee, unspecified: Secondary | ICD-10-CM | POA: Diagnosis present

## 2019-01-05 DIAGNOSIS — H409 Unspecified glaucoma: Secondary | ICD-10-CM | POA: Diagnosis not present

## 2019-01-05 DIAGNOSIS — M1711 Unilateral primary osteoarthritis, right knee: Secondary | ICD-10-CM | POA: Diagnosis not present

## 2019-01-05 DIAGNOSIS — K219 Gastro-esophageal reflux disease without esophagitis: Secondary | ICD-10-CM | POA: Insufficient documentation

## 2019-01-05 DIAGNOSIS — M1710 Unilateral primary osteoarthritis, unspecified knee: Secondary | ICD-10-CM

## 2019-01-05 DIAGNOSIS — E559 Vitamin D deficiency, unspecified: Secondary | ICD-10-CM | POA: Insufficient documentation

## 2019-01-05 DIAGNOSIS — M810 Age-related osteoporosis without current pathological fracture: Secondary | ICD-10-CM | POA: Insufficient documentation

## 2019-01-05 DIAGNOSIS — G8918 Other acute postprocedural pain: Secondary | ICD-10-CM | POA: Diagnosis not present

## 2019-01-05 HISTORY — PX: TOTAL KNEE ARTHROPLASTY: SHX125

## 2019-01-05 LAB — TYPE AND SCREEN
ABO/RH(D): A POS
Antibody Screen: NEGATIVE

## 2019-01-05 SURGERY — ARTHROPLASTY, KNEE, TOTAL
Anesthesia: Regional | Site: Knee | Laterality: Right

## 2019-01-05 MED ORDER — PROPOFOL 10 MG/ML IV BOLUS
INTRAVENOUS | Status: DC | PRN
  Administered 2019-01-05: 10 mg via INTRAVENOUS
  Administered 2019-01-05: 110 mg via INTRAVENOUS

## 2019-01-05 MED ORDER — MORPHINE SULFATE (PF) 2 MG/ML IV SOLN
1.0000 mg | INTRAVENOUS | Status: DC | PRN
  Administered 2019-01-05: 1 mg via INTRAVENOUS
  Filled 2019-01-05: qty 1

## 2019-01-05 MED ORDER — PROMETHAZINE HCL 25 MG/ML IJ SOLN: 6.2500 mg | INTRAMUSCULAR | Status: DC | PRN

## 2019-01-05 MED ORDER — CEFAZOLIN SODIUM-DEXTROSE 2-4 GM/100ML-% IV SOLN
2.0000 g | INTRAVENOUS | Status: AC
  Administered 2019-01-05: 2 g via INTRAVENOUS
  Filled 2019-01-05: qty 100

## 2019-01-05 MED ORDER — DEXAMETHASONE SODIUM PHOSPHATE 10 MG/ML IJ SOLN: 8.0000 mg | Freq: Once | INTRAMUSCULAR | Status: DC

## 2019-01-05 MED ORDER — FENTANYL CITRATE (PF) 100 MCG/2ML IJ SOLN
25.0000 ug | INTRAMUSCULAR | Status: DC | PRN
  Administered 2019-01-05 (×2): 50 ug via INTRAVENOUS

## 2019-01-05 MED ORDER — LATANOPROST 0.005 % OP SOLN
1.0000 [drp] | Freq: Every day | OPHTHALMIC | Status: DC
  Administered 2019-01-05: 1 [drp] via OPHTHALMIC
  Filled 2019-01-05: qty 2.5

## 2019-01-05 MED ORDER — DEXAMETHASONE SODIUM PHOSPHATE 10 MG/ML IJ SOLN
INTRAMUSCULAR | Status: DC | PRN
  Administered 2019-01-05: 6 mg via INTRAVENOUS

## 2019-01-05 MED ORDER — METOCLOPRAMIDE HCL 5 MG PO TABS: 5.0000 mg | ORAL_TABLET | Freq: Three times a day (TID) | ORAL | Status: DC | PRN

## 2019-01-05 MED ORDER — PROPOFOL 500 MG/50ML IV EMUL
INTRAVENOUS | Status: AC
  Filled 2019-01-05: qty 50

## 2019-01-05 MED ORDER — ONDANSETRON HCL 4 MG/2ML IJ SOLN
INTRAMUSCULAR | Status: DC | PRN
  Administered 2019-01-05: 4 mg via INTRAVENOUS

## 2019-01-05 MED ORDER — PANTOPRAZOLE SODIUM 40 MG PO TBEC
40.0000 mg | DELAYED_RELEASE_TABLET | Freq: Every day | ORAL | Status: DC
  Administered 2019-01-06: 40 mg via ORAL
  Filled 2019-01-05: qty 1

## 2019-01-05 MED ORDER — FLEET ENEMA 7-19 GM/118ML RE ENEM: 1.0000 | ENEMA | Freq: Once | RECTAL | Status: DC | PRN

## 2019-01-05 MED ORDER — FENTANYL CITRATE (PF) 100 MCG/2ML IJ SOLN
INTRAMUSCULAR | Status: AC
  Administered 2019-01-05: 50 ug via INTRAVENOUS
  Filled 2019-01-05: qty 2

## 2019-01-05 MED ORDER — TRAMADOL HCL 50 MG PO TABS: 50.0000 mg | ORAL_TABLET | Freq: Four times a day (QID) | ORAL | Status: DC | PRN

## 2019-01-05 MED ORDER — METHOCARBAMOL 500 MG IVPB - SIMPLE MED
500.0000 mg | Freq: Four times a day (QID) | INTRAVENOUS | Status: DC | PRN
  Administered 2019-01-05: 09:00:00 500 mg via INTRAVENOUS
  Filled 2019-01-05: qty 50

## 2019-01-05 MED ORDER — MIDAZOLAM HCL 2 MG/2ML IJ SOLN
INTRAMUSCULAR | Status: AC
  Filled 2019-01-05: qty 2

## 2019-01-05 MED ORDER — MIDAZOLAM HCL 5 MG/5ML IJ SOLN
INTRAMUSCULAR | Status: DC | PRN
  Administered 2019-01-05 (×2): 1 mg via INTRAVENOUS

## 2019-01-05 MED ORDER — SODIUM CHLORIDE (PF) 0.9 % IJ SOLN
INTRAMUSCULAR | Status: DC | PRN
  Administered 2019-01-05: 60 mL

## 2019-01-05 MED ORDER — METOCLOPRAMIDE HCL 5 MG/ML IJ SOLN: 5.0000 mg | Freq: Three times a day (TID) | INTRAMUSCULAR | Status: DC | PRN

## 2019-01-05 MED ORDER — BUPIVACAINE LIPOSOME 1.3 % IJ SUSP
INTRAMUSCULAR | Status: DC | PRN
  Administered 2019-01-05: 20 mL

## 2019-01-05 MED ORDER — FENTANYL CITRATE (PF) 100 MCG/2ML IJ SOLN
INTRAMUSCULAR | Status: AC
  Filled 2019-01-05: qty 2

## 2019-01-05 MED ORDER — BISACODYL 10 MG RE SUPP: 10.0000 mg | Freq: Every day | RECTAL | Status: DC | PRN

## 2019-01-05 MED ORDER — BRIMONIDINE TARTRATE 0.15 % OP SOLN
1.0000 [drp] | Freq: Two times a day (BID) | OPHTHALMIC | Status: DC
  Administered 2019-01-05: 1 [drp] via OPHTHALMIC
  Filled 2019-01-05: qty 5

## 2019-01-05 MED ORDER — BUPIVACAINE-EPINEPHRINE (PF) 0.5% -1:200000 IJ SOLN
INTRAMUSCULAR | Status: DC | PRN
  Administered 2019-01-05: 15 mL via PERINEURAL

## 2019-01-05 MED ORDER — PHENOL 1.4 % MT LIQD: 1.0000 | OROMUCOSAL | Status: DC | PRN

## 2019-01-05 MED ORDER — DOCUSATE SODIUM 100 MG PO CAPS
100.0000 mg | ORAL_CAPSULE | Freq: Two times a day (BID) | ORAL | Status: DC
  Administered 2019-01-06: 100 mg via ORAL
  Filled 2019-01-05 (×2): qty 1

## 2019-01-05 MED ORDER — POLYETHYLENE GLYCOL 3350 17 G PO PACK: 17.0000 g | PACK | Freq: Every day | ORAL | Status: DC | PRN

## 2019-01-05 MED ORDER — LACTATED RINGERS IV SOLN
INTRAVENOUS | Status: DC
  Administered 2019-01-05 (×2): via INTRAVENOUS

## 2019-01-05 MED ORDER — PHENYLEPHRINE 40 MCG/ML (10ML) SYRINGE FOR IV PUSH (FOR BLOOD PRESSURE SUPPORT)
PREFILLED_SYRINGE | INTRAVENOUS | Status: DC | PRN
  Administered 2019-01-05: 80 ug via INTRAVENOUS

## 2019-01-05 MED ORDER — DIPHENHYDRAMINE HCL 12.5 MG/5ML PO ELIX: 12.5000 mg | ORAL_SOLUTION | ORAL | Status: DC | PRN

## 2019-01-05 MED ORDER — OXYCODONE HCL 5 MG PO TABS
5.0000 mg | ORAL_TABLET | Freq: Once | ORAL | Status: AC | PRN
  Administered 2019-01-05: 09:00:00 5 mg via ORAL

## 2019-01-05 MED ORDER — PROPOFOL 500 MG/50ML IV EMUL
INTRAVENOUS | Status: DC | PRN
  Administered 2019-01-05: 35 ug/kg/min via INTRAVENOUS

## 2019-01-05 MED ORDER — GLYCOPYRROLATE PF 0.2 MG/ML IJ SOSY
PREFILLED_SYRINGE | INTRAMUSCULAR | Status: AC
  Filled 2019-01-05: qty 1

## 2019-01-05 MED ORDER — OXYCODONE HCL 5 MG/5ML PO SOLN: 5.0000 mg | Freq: Once | ORAL | Status: AC | PRN

## 2019-01-05 MED ORDER — OXYCODONE HCL 5 MG PO TABS
5.0000 mg | ORAL_TABLET | ORAL | Status: DC | PRN
  Administered 2019-01-05: 10 mg via ORAL
  Filled 2019-01-05 (×2): qty 2

## 2019-01-05 MED ORDER — CHLORHEXIDINE GLUCONATE 4 % EX LIQD: 60.0000 mL | Freq: Once | CUTANEOUS | Status: DC

## 2019-01-05 MED ORDER — BUPIVACAINE IN DEXTROSE 0.75-8.25 % IT SOLN
INTRATHECAL | Status: DC | PRN
  Administered 2019-01-05: 1.4 mL via INTRATHECAL

## 2019-01-05 MED ORDER — SODIUM CHLORIDE (PF) 0.9 % IJ SOLN
INTRAMUSCULAR | Status: AC
  Filled 2019-01-05: qty 50

## 2019-01-05 MED ORDER — CEFAZOLIN SODIUM-DEXTROSE 2-4 GM/100ML-% IV SOLN
2.0000 g | Freq: Four times a day (QID) | INTRAVENOUS | Status: AC
  Administered 2019-01-05 (×2): 2 g via INTRAVENOUS
  Filled 2019-01-05 (×2): qty 100

## 2019-01-05 MED ORDER — OXYCODONE HCL 5 MG PO TABS
ORAL_TABLET | ORAL | Status: AC
  Administered 2019-01-05: 5 mg via ORAL
  Filled 2019-01-05: qty 1

## 2019-01-05 MED ORDER — MENTHOL 3 MG MT LOZG: 1.0000 | LOZENGE | OROMUCOSAL | Status: DC | PRN

## 2019-01-05 MED ORDER — POVIDONE-IODINE 10 % EX SWAB
2.0000 "application " | Freq: Once | CUTANEOUS | Status: AC
  Administered 2019-01-05: 2 via TOPICAL

## 2019-01-05 MED ORDER — PHENYLEPHRINE HCL-NACL 10-0.9 MG/250ML-% IV SOLN
INTRAVENOUS | Status: DC | PRN
  Administered 2019-01-05: 40 ug/min via INTRAVENOUS

## 2019-01-05 MED ORDER — LEVOTHYROXINE SODIUM 75 MCG PO TABS
75.0000 ug | ORAL_TABLET | Freq: Every day | ORAL | Status: DC
  Administered 2019-01-06: 75 ug via ORAL
  Filled 2019-01-05: qty 1

## 2019-01-05 MED ORDER — ASPIRIN EC 325 MG PO TBEC
325.0000 mg | DELAYED_RELEASE_TABLET | Freq: Two times a day (BID) | ORAL | Status: DC
  Administered 2019-01-06: 325 mg via ORAL
  Filled 2019-01-05: qty 1

## 2019-01-05 MED ORDER — GLYCOPYRROLATE PF 0.2 MG/ML IJ SOSY
PREFILLED_SYRINGE | INTRAMUSCULAR | Status: DC | PRN
  Administered 2019-01-05: .2 mg via INTRAVENOUS

## 2019-01-05 MED ORDER — TRANEXAMIC ACID-NACL 1000-0.7 MG/100ML-% IV SOLN
1000.0000 mg | INTRAVENOUS | Status: AC
  Administered 2019-01-05: 1000 mg via INTRAVENOUS
  Filled 2019-01-05: qty 100

## 2019-01-05 MED ORDER — ONDANSETRON HCL 4 MG PO TABS: 4.0000 mg | ORAL_TABLET | Freq: Four times a day (QID) | ORAL | Status: DC | PRN

## 2019-01-05 MED ORDER — ONDANSETRON HCL 4 MG/2ML IJ SOLN
INTRAMUSCULAR | Status: AC
  Filled 2019-01-05: qty 2

## 2019-01-05 MED ORDER — FENTANYL CITRATE (PF) 100 MCG/2ML IJ SOLN
INTRAMUSCULAR | Status: DC | PRN
  Administered 2019-01-05 (×2): 50 ug via INTRAVENOUS

## 2019-01-05 MED ORDER — ACETAMINOPHEN 500 MG PO TABS: 1000.0000 mg | ORAL_TABLET | Freq: Once | ORAL | Status: DC

## 2019-01-05 MED ORDER — PHENYLEPHRINE 40 MCG/ML (10ML) SYRINGE FOR IV PUSH (FOR BLOOD PRESSURE SUPPORT)
PREFILLED_SYRINGE | INTRAVENOUS | Status: AC
  Filled 2019-01-05: qty 10

## 2019-01-05 MED ORDER — ACETAMINOPHEN 10 MG/ML IV SOLN
1000.0000 mg | Freq: Four times a day (QID) | INTRAVENOUS | Status: DC
  Administered 2019-01-05 (×2): 1000 mg via INTRAVENOUS
  Filled 2019-01-05 (×2): qty 100

## 2019-01-05 MED ORDER — METHOCARBAMOL 500 MG IVPB - SIMPLE MED
INTRAVENOUS | Status: AC
  Administered 2019-01-05: 500 mg via INTRAVENOUS
  Filled 2019-01-05: qty 50

## 2019-01-05 MED ORDER — DEXAMETHASONE SODIUM PHOSPHATE 10 MG/ML IJ SOLN
10.0000 mg | Freq: Once | INTRAMUSCULAR | Status: AC
  Administered 2019-01-06: 10 mg via INTRAVENOUS
  Filled 2019-01-05: qty 1

## 2019-01-05 MED ORDER — SODIUM CHLORIDE 0.9 % IV SOLN
INTRAVENOUS | Status: DC
  Administered 2019-01-05: 19:00:00 via INTRAVENOUS

## 2019-01-05 MED ORDER — SODIUM CHLORIDE 0.9 % IR SOLN
Status: DC | PRN
  Administered 2019-01-05: 1000 mL

## 2019-01-05 MED ORDER — PHENYLEPHRINE HCL (PRESSORS) 10 MG/ML IV SOLN
INTRAVENOUS | Status: AC
  Filled 2019-01-05: qty 1

## 2019-01-05 MED ORDER — METHOCARBAMOL 500 MG PO TABS
500.0000 mg | ORAL_TABLET | Freq: Four times a day (QID) | ORAL | Status: DC | PRN
  Administered 2019-01-05 – 2019-01-06 (×2): 500 mg via ORAL
  Filled 2019-01-05 (×2): qty 1

## 2019-01-05 MED ORDER — ACETAMINOPHEN 500 MG PO TABS
1000.0000 mg | ORAL_TABLET | Freq: Four times a day (QID) | ORAL | Status: AC
  Administered 2019-01-05 – 2019-01-06 (×2): 1000 mg via ORAL
  Filled 2019-01-05 (×2): qty 2

## 2019-01-05 MED ORDER — ONDANSETRON HCL 4 MG/2ML IJ SOLN
4.0000 mg | Freq: Four times a day (QID) | INTRAMUSCULAR | Status: DC | PRN
  Administered 2019-01-05: 4 mg via INTRAVENOUS
  Filled 2019-01-05: qty 2

## 2019-01-05 MED ORDER — DEXAMETHASONE SODIUM PHOSPHATE 10 MG/ML IJ SOLN
INTRAMUSCULAR | Status: AC
  Filled 2019-01-05: qty 1

## 2019-01-05 MED ORDER — SODIUM CHLORIDE (PF) 0.9 % IJ SOLN
INTRAMUSCULAR | Status: AC
  Filled 2019-01-05: qty 10

## 2019-01-05 SURGICAL SUPPLY — 58 items
BAG ZIPLOCK 12X15 (MISCELLANEOUS) ×3 IMPLANT
BLADE SAG 18X100X1.27 (BLADE) ×3 IMPLANT
BLADE SAW SGTL 11.0X1.19X90.0M (BLADE) ×3 IMPLANT
BLADE SURG SZ10 CARB STEEL (BLADE) ×6 IMPLANT
BNDG ELASTIC 6X5.8 VLCR STR LF (GAUZE/BANDAGES/DRESSINGS) ×3 IMPLANT
BOWL SMART MIX CTS (DISPOSABLE) ×3 IMPLANT
CEMENT HV SMART SET (Cement) ×6 IMPLANT
CEMENT TIBIA MBT SIZE 2.5 (Knees) ×1 IMPLANT
CLOSURE WOUND 1/2 X4 (GAUZE/BANDAGES/DRESSINGS) ×1
COVER SURGICAL LIGHT HANDLE (MISCELLANEOUS) ×3 IMPLANT
COVER WAND RF STERILE (DRAPES) IMPLANT
CUFF TOURN SGL QUICK 34 (TOURNIQUET CUFF) ×2
CUFF TRNQT CYL 34X4.125X (TOURNIQUET CUFF) ×1 IMPLANT
DECANTER SPIKE VIAL GLASS SM (MISCELLANEOUS) ×3 IMPLANT
DRAPE U-SHAPE 47X51 STRL (DRAPES) ×3 IMPLANT
DRSG ADAPTIC 3X8 NADH LF (GAUZE/BANDAGES/DRESSINGS) ×3 IMPLANT
DRSG PAD ABDOMINAL 8X10 ST (GAUZE/BANDAGES/DRESSINGS) ×3 IMPLANT
DURAPREP 26ML APPLICATOR (WOUND CARE) ×3 IMPLANT
ELECT REM PT RETURN 15FT ADLT (MISCELLANEOUS) ×3 IMPLANT
EVACUATOR 1/8 PVC DRAIN (DRAIN) ×3 IMPLANT
FEMUR SIGMA PS SZ 2.5 R (Femur) ×3 IMPLANT
GAUZE SPONGE 4X4 12PLY STRL (GAUZE/BANDAGES/DRESSINGS) ×3 IMPLANT
GLOVE BIO SURGEON STRL SZ7 (GLOVE) ×3 IMPLANT
GLOVE BIO SURGEON STRL SZ8 (GLOVE) ×3 IMPLANT
GLOVE BIOGEL PI IND STRL 6.5 (GLOVE) ×1 IMPLANT
GLOVE BIOGEL PI IND STRL 7.0 (GLOVE) ×1 IMPLANT
GLOVE BIOGEL PI IND STRL 8 (GLOVE) ×1 IMPLANT
GLOVE BIOGEL PI INDICATOR 6.5 (GLOVE) ×2
GLOVE BIOGEL PI INDICATOR 7.0 (GLOVE) ×2
GLOVE BIOGEL PI INDICATOR 8 (GLOVE) ×2
GLOVE SURG SS PI 6.5 STRL IVOR (GLOVE) ×3 IMPLANT
GOWN STRL REUS W/TWL LRG LVL3 (GOWN DISPOSABLE) ×9 IMPLANT
HANDPIECE INTERPULSE COAX TIP (DISPOSABLE) ×2
HOLDER FOLEY CATH W/STRAP (MISCELLANEOUS) IMPLANT
IMMOBILIZER KNEE 20 (SOFTGOODS) ×3
IMMOBILIZER KNEE 20 THIGH 36 (SOFTGOODS) ×1 IMPLANT
INSERT PFC SIG STB SZ 2.5 15.5 (Knees) ×3 IMPLANT
KIT TURNOVER KIT A (KITS) IMPLANT
MANIFOLD NEPTUNE II (INSTRUMENTS) ×3 IMPLANT
NS IRRIG 1000ML POUR BTL (IV SOLUTION) ×3 IMPLANT
PACK TOTAL KNEE CUSTOM (KITS) ×3 IMPLANT
PADDING CAST COTTON 6X4 STRL (CAST SUPPLIES) ×3 IMPLANT
PATELLA DOME PFC 35MM (Knees) ×3 IMPLANT
PENCIL SMOKE EVACUATOR (MISCELLANEOUS) IMPLANT
PIN STEINMAN FIXATION KNEE (PIN) ×3 IMPLANT
PROTECTOR NERVE ULNAR (MISCELLANEOUS) ×3 IMPLANT
SET HNDPC FAN SPRY TIP SCT (DISPOSABLE) ×1 IMPLANT
STRIP CLOSURE SKIN 1/2X4 (GAUZE/BANDAGES/DRESSINGS) ×2 IMPLANT
SUT MNCRL AB 4-0 PS2 18 (SUTURE) ×3 IMPLANT
SUT STRATAFIX 0 PDS 27 VIOLET (SUTURE) ×3
SUT VIC AB 2-0 CT1 27 (SUTURE) ×6
SUT VIC AB 2-0 CT1 TAPERPNT 27 (SUTURE) ×3 IMPLANT
SUTURE STRATFX 0 PDS 27 VIOLET (SUTURE) ×1 IMPLANT
TIBIA MBT CEMENT SIZE 2.5 (Knees) ×3 IMPLANT
TRAY FOLEY MTR SLVR 16FR STAT (SET/KITS/TRAYS/PACK) ×3 IMPLANT
WATER STERILE IRR 1000ML POUR (IV SOLUTION) ×6 IMPLANT
WRAP KNEE MAXI GEL POST OP (GAUZE/BANDAGES/DRESSINGS) ×3 IMPLANT
YANKAUER SUCT BULB TIP 10FT TU (MISCELLANEOUS) ×3 IMPLANT

## 2019-01-05 NOTE — Anesthesia Procedure Notes (Signed)
Procedure Name: LMA Insertion Date/Time: 01/05/2019 7:54 AM Performed by: Sharlette Dense, CRNA Preoxygenation: Pre-oxygenation with 100% oxygen Induction Type: IV induction LMA: LMA inserted LMA Size: 3.0 Number of attempts: 1 Placement Confirmation: positive ETCO2 and breath sounds checked- equal and bilateral Tube secured with: Tape Dental Injury: Teeth and Oropharynx as per pre-operative assessment

## 2019-01-05 NOTE — Interval H&P Note (Signed)
History and Physical Interval Note:  01/05/2019 6:52 AM  Katie [name]  has presented today for surgery, with the diagnosis of right knee osteoarthritis.  The various methods of treatment have been discussed with the patient and family. After consideration of risks, benefits and other options for treatment, the patient has consented to  Procedure(s) with comments: TOTAL KNEE ARTHROPLASTY (Right) - 21min as a surgical intervention.  The patient's history has been reviewed, patient examined, no change in status, stable for surgery.  I have reviewed the patient's chart and labs.  Questions were answered to the patient's satisfaction.     Pilar Plate [name]

## 2019-01-05 NOTE — Anesthesia Procedure Notes (Signed)
Date/Time: 01/05/2019 7:13 AM Performed by: Sharlette Dense, CRNA Oxygen Delivery Method: Simple face mask

## 2019-01-05 NOTE — Anesthesia Procedure Notes (Signed)
Anesthesia Regional Block: Adductor canal block   Pre-Anesthetic Checklist: ,, timeout performed, Correct Patient, Correct Site, Correct Laterality, Correct Procedure, Correct Position, site marked, Risks and benefits discussed, pre-op evaluation,  At surgeon's request and post-op pain management  Laterality: Right  Prep: Maximum Sterile Barrier Precautions used, chloraprep       Needles:  Injection technique: Single-shot  Needle Type: Echogenic Stimulator Needle     Needle Length: 9cm  Needle Gauge: 22     Additional Needles:   Procedures:,,,, ultrasound used (permanent image in chart),,,,  Narrative:  Start time: 01/05/2019 7:20 AM End time: 01/05/2019 7:02 AM Injection made incrementally with aspirations every 5 mL.  Performed by: Personally  Anesthesiologist: Brennan Bailey, MD  Additional Notes: Risks, benefits, and alternative discussed. Patient gave consent for procedure. Patient prepped and draped in sterile fashion. Sedation administered, patient remains easily responsive to voice. Relevant anatomy identified with ultrasound guidance. Local anesthetic given in 5cc increments with no signs or symptoms of intravascular injection. No pain or paraesthesias with injection. Patient monitored throughout procedure with signs of LAST or immediate complications. Tolerated well. Ultrasound image placed in chart.  Tawny Asal, MD

## 2019-01-05 NOTE — Evaluation (Signed)
Physical Therapy Evaluation Patient Details Name: Katie [name] MRN: [id number] DOB: 1940/03/15 Today's Date: 01/05/2019   History of Present Illness  78 yo female s/p R TKR on 01/05/19. 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  Clinical Impression   Pt presents with R knee pain, decreased R knee ROM, difficulty performing mobility tasks, unsteadiness in standing, and decreased activity tolerance post-operatively. Pt to benefit from acute PT to address deficits. Pt ambulated 20 ft with RW with min guard assist with use of KI for stability purposes, verbal cuing for form and safety provided throughout. Pt reporting nausea post-ambulation, resolved well with rest. Pt educated on ankle pumps (20/hour) to perform this afternoon/evening to increase circulation, to pt's tolerance and limited by pain. PT to progress mobility as tolerated, and will continue to follow acutely.        Follow Up Recommendations Follow surgeon's recommendation for DC plan and follow-up therapies;Supervision for mobility/OOB(OPPT on 12/4)    Equipment Recommendations  Rolling walker with 5" wheels    Recommendations for Other Services       Precautions / Restrictions Precautions Precautions: Fall Required Braces or Orthoses: Knee Immobilizer - Right Knee Immobilizer - Right: On when out of bed or walking;Discontinue once straight leg raise with < 10 degree lag Restrictions Weight Bearing Restrictions: No Other Position/Activity Restrictions: WBAT      Mobility  Bed Mobility Overal bed mobility: Needs Assistance Bed Mobility: Supine to Sit     Supine to sit: Min assist;HOB elevated     General bed mobility comments: min assist for RLE management, increased time and effort to scoot to EOB.  Transfers Overall transfer level: Needs assistance Equipment used: Rolling walker (2 wheeled) Transfers: Sit to/from Stand Sit to Stand: Min  assist;From elevated surface         General transfer comment: Min assist for power up, pt with heavy anterior leaning to come to standing. Verbal cuing for hand placement when rising x2. First sit to stand attempt with unsuccessful power up, requiring elevated bed height.  Ambulation/Gait Ambulation/Gait assistance: Min guard Gait Distance (Feet): 20 Feet Assistive device: Rolling walker (2 wheeled) Gait Pattern/deviations: Step-to pattern;Decreased step length - right;Decreased step length - left;Trunk flexed;Antalgic;Decreased weight shift to right;Decreased stance time - right Gait velocity: decr   General Gait Details: Min guard for safety, verbal cuing for step-to sequencing, placement in RW, upright posture. Pt fatigued after 20 ft, requiring sit in recliner following along with PT.  Stairs            Wheelchair Mobility    Modified Rankin (Stroke Patients Only)       Balance Overall balance assessment: Needs assistance;History of Falls(most recent fall 1 yr ago) Sitting-balance support: No upper extremity supported;Feet supported Sitting balance-Leahy Scale: Good     Standing balance support: Bilateral upper extremity supported;During functional activity Standing balance-Leahy Scale: Poor Standing balance comment: reliant on external support                             Pertinent Vitals/Pain Pain Assessment: 0-10 Pain Score: 6  Pain Location: R knee, posterior Pain Descriptors / Indicators: Sore;Aching Pain Intervention(s): Repositioned;Limited activity within patient's tolerance;Monitored during session;Premedicated before session    Home Living Family/patient expects to be discharged to:: Private residence Living Arrangements: Alone Available Help at Discharge: Family;Available 24 hours/day(daughter to stay with pt and work from home as long as needed) Type of  Home: Apartment Home Access: Stairs to enter   [employer] of Steps:  1(55 paces to get to entry) Home Layout: One level Home Equipment: Cane - single point;Bedside commode;Toilet riser      Prior Function Level of Independence: Independent with assistive device(s)         Comments: pt using cane for ambulation PTA, otherwise independent. PTA, pt drove.     Hand Dominance   Dominant Hand: Right    Extremity/Trunk Assessment   Upper Extremity Assessment Upper Extremity Assessment: Overall WFL for tasks assessed    Lower Extremity Assessment Lower Extremity Assessment: Generalized weakness;RLE deficits/detail RLE Deficits / Details: post-surgical weakness; able to perform ankle pumps, quad set, heel slide to ~50* limited by pain, SLR with mild quad lag RLE Sensation: history of peripheral neuropathy    Cervical / Trunk Assessment Cervical / Trunk Assessment: Normal  Communication   Communication: No difficulties  Cognition Arousal/Alertness: Awake/alert Behavior During Therapy: WFL for tasks assessed/performed Overall Cognitive Status: Within Functional Limits for tasks assessed                                        General Comments      Exercises Total Joint Exercises Ankle Circles/Pumps: AROM;Both;5 reps;Seated Heel Slides: AAROM;Right;5 reps;Seated   Assessment/Plan    PT Assessment Patient needs continued PT services  PT Problem List Decreased strength;Decreased mobility;Decreased range of motion;Decreased activity tolerance;Decreased knowledge of use of DME;Decreased balance;Pain;Decreased safety awareness       PT Treatment Interventions DME instruction;Therapeutic activities;Gait training;Therapeutic exercise;Patient/family education;Balance training;Stair training;Functional mobility training    PT Goals (Current goals can be found in the Care Plan section)  Acute Rehab PT Goals Patient Stated Goal: go home to my cat PT Goal Formulation: With patient Time For Goal Achievement: 01/12/19 Potential to  Achieve Goals: Good    Frequency 7X/week   Barriers to discharge        Co-evaluation               AM-PAC PT "6 Clicks" Mobility  Outcome Measure Help needed turning from your back to your side while in a flat bed without using bedrails?: A Little Help needed moving from lying on your back to sitting on the side of a flat bed without using bedrails?: A Little Help needed moving to and from a bed to a chair (including a wheelchair)?: A Little Help needed standing up from a chair using your arms (e.g., wheelchair or bedside chair)?: A Little Help needed to walk in hospital room?: A Little Help needed climbing 3-5 steps with a railing? : A Lot 6 Click Score: 17    End of Session Equipment Utilized During Treatment: Gait belt;Right knee immobilizer Activity Tolerance: Patient tolerated treatment well;Patient limited by pain;Patient limited by fatigue Patient left: in chair;with chair alarm set;with call bell/phone within reach;with SCD's reapplied Nurse Communication: Mobility status PT Visit Diagnosis: Other abnormalities of gait and mobility (R26.89);Difficulty in walking, not elsewhere classified (R26.2)    Time: 1115-5208 PT Time Calculation (min) (ACUTE ONLY): 35 min   Charges:   PT Evaluation $PT Eval Low Complexity: 1 Low PT Treatments $Gait Training: 8-22 mins        Aemon Koeller E, PT Acute Rehabilitation Services Pager [phone]  Office [phone]   Abenezer Odonell D Ruthella Kirchman 01/05/2019, 3:04 PM

## 2019-01-05 NOTE — Anesthesia Procedure Notes (Signed)
Date/Time: 01/05/2019 6:53 AM Performed by: Sharlette Dense, CRNA Oxygen Delivery Method: Nasal cannula

## 2019-01-05 NOTE — Discharge Instructions (Signed)
Dr. Gaynelle Arabian Total Joint Specialist [facility] [address] [place], [place] [address] [phone]  TOTAL KNEE REPLACEMENT POSTOPERATIVE DIRECTIONS  Knee Rehabilitation, Guidelines Following Surgery  Results after knee surgery are often greatly improved when you follow the exercise, range of motion and muscle strengthening exercises prescribed by your doctor. Safety measures are also important to protect the knee from further injury. Any time any of these exercises cause you to have increased pain or swelling in your knee joint, decrease the amount until you are comfortable again and slowly increase them. If you have problems or questions, call your caregiver or physical therapist for advice.   HOME CARE INSTRUCTIONS   Remove items at home which could result in a fall. This includes throw rugs or furniture in walking pathways.   ICE to the affected knee every three hours for 30 minutes at a time and then as needed for pain and swelling.  Continue to use ice on the knee for pain and swelling from surgery. You may notice swelling that will progress down to the foot and ankle.  This is normal after surgery.  Elevate the leg when you are not up walking on it.    Continue to use the breathing machine which will help keep your temperature down.  It is common for your temperature to cycle up and down following surgery, especially at night when you are not up moving around and exerting yourself.  The breathing machine keeps your lungs expanded and your temperature down.  Do not place pillow under knee, focus on keeping the knee straight while resting  DIET You may resume your previous home diet once your are discharged from the hospital.  DRESSING / WOUND CARE / SHOWERING You may change your dressing 3-5 days after surgery.  Then change the dressing every day with sterile gauze.  Please use good hand washing techniques before changing the dressing.  Do not use any lotions  or creams on the incision until instructed by your surgeon. You may start showering once you are discharged home but do not submerge the incision under water. Just pat the incision dry and apply a dry gauze dressing on daily. Change the surgical dressing daily and reapply a dry dressing each time.  ACTIVITY Walk with your walker as instructed. Use walker as long as suggested by your caregivers. Avoid periods of inactivity such as sitting longer than an hour when not asleep. This helps prevent blood clots.  You may resume a sexual relationship in one month or when given the OK by your doctor.  You may return to work once you are cleared by your doctor.  Do not drive a car for 6 weeks or until released by you surgeon.  Do not drive while taking narcotics.  WEIGHT BEARING Weight bearing as tolerated with assist device (walker, cane, etc) as directed, use it as long as suggested by your surgeon or therapist, typically at least 4-6 weeks.  POSTOPERATIVE CONSTIPATION PROTOCOL Constipation - defined medically as fewer than three stools per week and severe constipation as less than one stool per week.  One of the most common issues patients have following surgery is constipation.  Even if you have a regular bowel pattern at home, your normal regimen is likely to be disrupted due to multiple reasons following surgery.  Combination of anesthesia, postoperative narcotics, change in appetite and fluid intake all can affect your bowels.  In order to avoid complications following surgery, here are some  recommendations in order to help you during your recovery period.  Colace (docusate) - Pick up an over-the-counter form of Colace or another stool softener and take twice a day as long as you are requiring postoperative pain medications.  Take with a full glass of water daily.  If you experience loose stools or diarrhea, hold the colace until you stool forms back up.  If your symptoms do not get better within 1  week or if they get worse, check with your doctor.  Dulcolax (bisacodyl) - Pick up over-the-counter and take as directed by the product packaging as needed to assist with the movement of your bowels.  Take with a full glass of water.  Use this product as needed if not relieved by Colace only.   MiraLax (polyethylene glycol) - Pick up over-the-counter to have on hand.  MiraLax is a solution that will increase the amount of water in your bowels to assist with bowel movements.  Take as directed and can mix with a glass of water, juice, soda, coffee, or tea.  Take if you go more than two days without a movement. Do not use MiraLax more than once per day. Call your doctor if you are still constipated or irregular after using this medication for 7 days in a row.  If you continue to have problems with postoperative constipation, please contact the office for further assistance and recommendations.  If you experience "the worst abdominal pain ever" or develop nausea or vomiting, please contact the office immediatly for further recommendations for treatment.  ITCHING If you experience itching with your medications, try taking only a single pain pill, or even half a pain pill at a time.  You can also use Benadryl over the counter for itching or also to help with sleep.   TED HOSE STOCKINGS Wear the elastic stockings on both legs for three weeks following surgery during the day but you may remove then at night for sleeping.  MEDICATIONS See your medication summary on the After Visit Summary that the nursing staff will review with you prior to discharge.  You may have some home medications which will be placed on hold until you complete the course of blood thinner medication.  It is important for you to complete the blood thinner medication as prescribed by your surgeon.  Continue your approved medications as instructed at time of discharge.  PRECAUTIONS If you experience chest pain or shortness of breath -  call 911 immediately for transfer to the hospital emergency department.  If you develop a fever greater that 101 F, purulent drainage from wound, increased redness or drainage from wound, foul odor from the wound/dressing, or calf pain - CONTACT YOUR SURGEON.                                                   FOLLOW-UP APPOINTMENTS Make sure you keep all of your appointments after your operation with your surgeon and caregivers. You should call the office at the above phone number and make an appointment for approximately two weeks after the date of your surgery or on the date instructed by your surgeon outlined in the "After Visit Summary".  RANGE OF MOTION AND STRENGTHENING EXERCISES  Rehabilitation of the knee is important following a knee injury or an operation. After just a few days of immobilization, the muscles of  the thigh which control the knee become weakened and shrink (atrophy). Knee exercises are designed to build up the tone and strength of the thigh muscles and to improve knee motion. Often times heat used for twenty to thirty minutes before working out will loosen up your tissues and help with improving the range of motion but do not use heat for the first two weeks following surgery. These exercises can be done on a training (exercise) mat, on the floor, on a table or on a bed. Use what ever works the best and is most comfortable for you Knee exercises include:   Leg Lifts - While your knee is still immobilized in a splint or cast, you can do straight leg raises. Lift the leg to 60 degrees, hold for 3 sec, and slowly lower the leg. Repeat 10-20 times 2-3 times daily. Perform this exercise against resistance later as your knee gets better.   Quad and Hamstring Sets - Tighten up the muscle on the front of the thigh (Quad) and hold for 5-10 sec. Repeat this 10-20 times hourly. Hamstring sets are done by pushing the foot backward against an object and holding for 5-10 sec. Repeat as with quad  sets.   Leg Slides: Lying on your back, slowly slide your foot toward your buttocks, bending your knee up off the floor (only go as far as is comfortable). Then slowly slide your foot back down until your leg is flat on the floor again.  Angel Wings: Lying on your back spread your legs to the side as far apart as you can without causing discomfort.  A rehabilitation program following serious knee injuries can speed recovery and prevent re-injury in the future due to weakened muscles. Contact your doctor or a physical therapist for more information on knee rehabilitation.   IF YOU ARE TRANSFERRED TO A SKILLED REHAB FACILITY If the patient is transferred to a skilled rehab facility following release from the hospital, a list of the current medications will be sent to the facility for the patient to continue.  When discharged from the skilled rehab facility, please have the facility set up the patient's [place] prior to being released. Also, the skilled facility will be responsible for providing the patient with their medications at time of release from the facility to include their pain medication, the muscle relaxants, and their blood thinner medication. If the patient is still at the rehab facility at time of the two week follow up appointment, the skilled rehab facility will also need to assist the patient in arranging follow up appointment in our office and any transportation needs.  MAKE SURE YOU:   Understand these instructions.   Get help right away if you are not doing well or get worse.    Pick up stool softner and laxative for home use following surgery while on pain medications. Do not submerge incision under water. Please use good hand washing techniques while changing dressing each day. May shower starting three days after surgery. Please use a clean towel to pat the incision dry following showers. Continue to use ice for pain and swelling after surgery. Do not  use any lotions or creams on the incision until instructed by your surgeon.

## 2019-01-05 NOTE — Anesthesia Postprocedure Evaluation (Signed)
Anesthesia Post Note  Patient: Katie [name]  Procedure(s) Performed: TOTAL KNEE ARTHROPLASTY (Right Knee)     Patient location during evaluation: PACU Anesthesia Type: General and Spinal Level of consciousness: awake and alert and oriented Pain management: pain level controlled Vital Signs Assessment: post-procedure vital signs reviewed and stable Respiratory status: spontaneous breathing, nonlabored ventilation and respiratory function stable Cardiovascular status: blood pressure returned to baseline Postop Assessment: no apparent nausea or vomiting and spinal receding Anesthetic complications: no    Last Vitals:  Vitals:   01/05/19 0945 01/05/19 1000  BP: 133/69 (!) 143/72  Pulse:  64  Resp: 10 12  Temp:    SpO2:  100%    Last Pain:  Vitals:   01/05/19 1000  TempSrc:   PainSc: [place]

## 2019-01-05 NOTE — Transfer of Care (Signed)
Immediate Anesthesia Transfer of Care Note  Patient: Katie [name]  Procedure(s) Performed: TOTAL KNEE ARTHROPLASTY (Right Knee)  Patient Location: PACU  Anesthesia Type:GA combined with regional for post-op pain  Level of Consciousness: awake, alert  and oriented  Airway & Oxygen Therapy: Patient Spontanous Breathing and Patient connected to face mask oxygen  Post-op Assessment: Report given to RN and Post -op Vital signs reviewed and stable  Post vital signs: Reviewed and stable  Last Vitals:  Vitals Value Taken Time  BP 136/76 01/05/19 0851  Temp    Pulse 65 01/05/19 0853  Resp 9 01/05/19 0853  SpO2 100 % 01/05/19 0853  Vitals shown include unvalidated device data.  Last Pain:  Vitals:   01/05/19 0549  TempSrc: Oral      Patients Stated Pain Goal: 3 (72/90/21 1155)  Complications: No apparent anesthesia complications

## 2019-01-05 NOTE — Op Note (Signed)
OPERATIVE REPORT-TOTAL KNEE ARTHROPLASTY   Pre-operative diagnosis- Osteoarthritis  Right knee(s)  Post-operative diagnosis- Osteoarthritis Right knee(s)  Procedure-  Right  Total Knee Arthroplasty  Surgeon- Dione Plover. Shronda Boeh, MD  Assistant- Theresa Duty, PA-C   Anesthesia-  Adductor canal block and spinal  EBL-150 mL   Drains Hemovac  Tourniquet time-  Total Tourniquet Time Documented: Thigh (Right) - 7 minutes Total: Thigh (Right) - 7 minutes     Complications- None  Condition-PACU - hemodynamically stable.   Brief Clinical Note  Katie [name] is a 78 y.o. year old female with end stage OA of her right knee with progressively worsening pain and dysfunction. She has constant pain, with activity and at rest and significant functional deficits with difficulties even with ADLs. She has had extensive non-op management including analgesics, injections of cortisone and viscosupplements, and home exercise program, but remains in significant pain with significant dysfunction.Radiographs show bone on bone arthritis medial and patellofemoral. She presents now for right Total Knee Arthroplasty.    Procedure in detail---   The patient is brought into the operating room and positioned supine on the operating table. After successful administration of  Adductor canal block and spinal,   a tourniquet is placed high on the  Right thigh(s) and the lower extremity is prepped and draped in the usual sterile fashion. Time out is performed by the operating team and then the  Right lower extremity is wrapped in Esmarch, knee flexed and the tourniquet inflated to 300 mmHg.       A midline incision is made with a ten blade through the subcutaneous tissue to the level of the extensor mechanism. A fresh blade is used to make a medial parapatellar arthrotomy. Soft tissue over the proximal medial tibia is subperiosteally elevated to the joint line with a knife and into the semimembranosus bursa with  a Cobb elevator. Soft tissue over the proximal lateral tibia is elevated with attention being paid to avoiding the patellar tendon on the tibial tubercle. The patella is everted, knee flexed 90 degrees and the ACL and PCL are removed. Findings are bone on bone medial and patellofemoral with large global osteophytes. The tourniquet was not working and was thus released at this time      The drill is used to create a starting hole in the distal femur and the canal is thoroughly irrigated with sterile saline to remove the fatty contents. The 5 degree Right  valgus alignment guide is placed into the femoral canal and the distal femoral cutting block is pinned to remove 10 mm off the distal femur. Resection is made with an oscillating saw.      The tibia is subluxed forward and the menisci are removed. The extramedullary alignment guide is placed referencing proximally at the medial aspect of the tibial tubercle and distally along the second metatarsal axis and tibial crest. The block is pinned to remove 87mm off the more deficient medial  side. Resection is made with an oscillating saw. Size 2.5is the most appropriate size for the tibia and the proximal tibia is prepared with the modular drill and keel punch for that size.      The femoral sizing guide is placed and size 2.5 is most appropriate. Rotation is marked off the epicondylar axis and confirmed by creating a rectangular flexion gap at 90 degrees. The size 2.5 cutting block is pinned in this rotation and the anterior, posterior and chamfer cuts are made with the oscillating saw. The intercondylar block is  then placed and that cut is made.      Trial size 2.5 tibial component, trial size 2.5 posterior stabilized femur and a 15  mm posterior stabilized rotating platform insert trial is placed. Full extension is achieved with excellent varus/valgus and anterior/posterior balance throughout full range of motion. The patella is everted and thickness measured to be  22  mm. Free hand resection is taken to 12 mm, a 35 template is placed, lug holes are drilled, trial patella is placed, and it tracks normally. Osteophytes are removed off the posterior femur with the trial in place. All trials are removed and the cut bone surfaces prepared with pulsatile lavage. Cement is mixed and once ready for implantation, the size 2.5 tibial implant, size  2.5 posterior stabilized femoral component, and the size 35 patella are cemented in place and the patella is held with the clamp. The trial insert is placed and the knee held in full extension. The Exparel (20 ml mixed with 60 ml saline) is injected into the extensor mechanism, posterior capsule, medial and lateral gutters and subcutaneous tissues.  All extruded cement is removed and once the cement is hard the permanent 15 mm posterior stabilized rotating platform insert is placed into the tibial tray.      The wound is copiously irrigated with saline solution and the extensor mechanism closed over a hemovac drain with #1 V-loc suture. Flexion against gravity is 140 degrees and the patella tracks normally. Subcutaneous tissue is closed with 2.0 vicryl and subcuticular with running 4.0 Monocryl. The incision is cleaned and dried and steri-strips and a bulky sterile dressing are applied. The limb is placed into a knee immobilizer and the patient is awakened and transported to recovery in stable condition.      Please note that a surgical assistant was a medical necessity for this procedure in order to perform it in a safe and expeditious manner. Surgical assistant was necessary to retract the ligaments and vital neurovascular structures to prevent injury to them and also necessary for proper positioning of the limb to allow for anatomic placement of the prosthesis.   Dione Plover Mairlyn Tegtmeyer, MD    01/05/2019, 8:18 AM

## 2019-01-05 NOTE — Plan of Care (Signed)
  Problem: Education: Goal: Knowledge of the prescribed therapeutic regimen will improve Outcome: Progressing Goal: Individualized Educational Video(s) Outcome: Progressing   Problem: Activity: Goal: Ability to avoid complications of mobility impairment will improve Outcome: Progressing Goal: Range of joint motion will improve Outcome: Progressing   Problem: Pain Management: Goal: Pain level will decrease with appropriate interventions Outcome: Progressing

## 2019-01-05 NOTE — Anesthesia Procedure Notes (Signed)
Spinal  Patient location during procedure: OR Start time: 01/05/2019 7:15 AM End time: 01/05/2019 7:17 AM Staffing Anesthesiologist: Brennan Bailey, MD Performed: anesthesiologist  Preanesthetic Checklist Completed: patient identified, surgical consent, pre-op evaluation, timeout performed, IV checked, risks and benefits discussed and monitors and equipment checked Spinal Block Patient position: sitting Prep: site prepped and draped and DuraPrep Patient monitoring: continuous pulse ox, blood pressure and heart rate Approach: midline Location: L3-4 Injection technique: single-shot Needle Needle type: Pencan  Needle gauge: 24 G Needle length: 9 cm Additional Notes Risks, benefits, and alternative discussed. Patient gave consent to procedure. Prepped and draped in sitting position. Patient sedated but responsive to voice. Clear CSF obtained after one needle pass. Positive terminal aspiration. No pain or paraesthesias with injection. Patient tolerated procedure well. Vital signs stable. Tawny Asal, MD

## 2019-01-05 NOTE — Care Plan (Signed)
Ortho Bundle Case Management Note  Patient Details  Name: Katie [name] MRN: [id number] Date of Birth: 1940-11-18  R TKA on 01-05-19 DCP:  Home with dtr.  1 story home with 1 ste. DME:  RW ordered through [facility].  Has a 3-in-1. PT:  [facility].  PT eval scheduled on 01-09-19.                  DME Arranged:  Gilford Rile rolling DME Agency:  [employer]  HH Arranged:  NA [place] Agency:  NA  Additional Comments: Please contact me with any questions of if this plan should need to change.  Marianne Sofia, RN,CCM [facility]  [phone] 01/05/2019, 4:00 PM

## 2019-01-06 ENCOUNTER — Encounter (HOSPITAL_COMMUNITY): Payer: Self-pay | Admitting: Orthopedic Surgery

## 2019-01-06 DIAGNOSIS — E039 Hypothyroidism, unspecified: Secondary | ICD-10-CM | POA: Diagnosis not present

## 2019-01-06 DIAGNOSIS — Z20828 Contact with and (suspected) exposure to other viral communicable diseases: Secondary | ICD-10-CM | POA: Diagnosis not present

## 2019-01-06 DIAGNOSIS — M1711 Unilateral primary osteoarthritis, right knee: Secondary | ICD-10-CM | POA: Diagnosis not present

## 2019-01-06 DIAGNOSIS — K219 Gastro-esophageal reflux disease without esophagitis: Secondary | ICD-10-CM | POA: Diagnosis not present

## 2019-01-06 DIAGNOSIS — Z9221 Personal history of antineoplastic chemotherapy: Secondary | ICD-10-CM | POA: Diagnosis not present

## 2019-01-06 DIAGNOSIS — H409 Unspecified glaucoma: Secondary | ICD-10-CM | POA: Diagnosis not present

## 2019-01-06 LAB — CBC
HCT: 29.4 % — ABNORMAL LOW (ref 36.0–46.0)
Hemoglobin: 9.4 g/dL — ABNORMAL LOW (ref 12.0–15.0)
MCH: 29.7 pg (ref 26.0–34.0)
MCHC: 32 g/dL (ref 30.0–36.0)
MCV: 92.7 fL (ref 80.0–100.0)
Platelets: 250 10*3/uL (ref 150–400)
RBC: 3.17 MIL/uL — ABNORMAL LOW (ref 3.87–5.11)
RDW: 12.8 % (ref 11.5–15.5)
WBC: 9.2 10*3/uL (ref 4.0–10.5)
nRBC: 0 % (ref 0.0–0.2)

## 2019-01-06 LAB — BASIC METABOLIC PANEL
Anion gap: 7 (ref 5–15)
BUN: 11 mg/dL (ref 8–23)
CO2: 22 mmol/L (ref 22–32)
Calcium: 8.2 mg/dL — ABNORMAL LOW (ref 8.9–10.3)
Chloride: 99 mmol/L (ref 98–111)
Creatinine, Ser: 0.69 mg/dL (ref 0.44–1.00)
GFR calc Af Amer: >60 mL/min (ref >60)
GFR calc non Af Amer: >60 mL/min (ref >60)
Glucose, Bld: 115 mg/dL — ABNORMAL HIGH (ref 70–99)
Potassium: 4.4 mmol/L (ref 3.5–5.1)
Sodium: 128 mmol/L — ABNORMAL LOW (ref 135–145)

## 2019-01-06 MED ORDER — OXYCODONE HCL 5 MG PO TABS: 5.0000 mg | ORAL_TABLET | Freq: Four times a day (QID) | ORAL | 0 refills | Status: DC | PRN

## 2019-01-06 MED ORDER — ASPIRIN 325 MG PO TBEC: 325.0000 mg | DELAYED_RELEASE_TABLET | Freq: Two times a day (BID) | ORAL | 0 refills | Status: AC

## 2019-01-06 MED ORDER — METHOCARBAMOL 500 MG PO TABS: 500.0000 mg | ORAL_TABLET | Freq: Four times a day (QID) | ORAL | 0 refills | Status: DC | PRN

## 2019-01-06 MED ORDER — TRAMADOL HCL 50 MG PO TABS: 50.0000 mg | ORAL_TABLET | Freq: Four times a day (QID) | ORAL | 0 refills | Status: DC | PRN

## 2019-01-06 NOTE — Progress Notes (Signed)
Physical Therapy Treatment Patient Details Name: Katie [name] MRN: [id number] DOB: 1940/08/12 Today's Date: 01/06/2019    History of Present Illness 78 yo female s/p R TKR on 01/05/19. 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    PT Comments    Pt progressing well with PT, ambulating hallway distance this session and tolerating initial TKR exercises. Pt with mild to moderate pain during ambulation, so pt continues to present with slightly decreased activity tolerance. PT to see pt for second session today to address stair training, gait training, and continuation of TKR exercises.    Follow Up Recommendations  Follow surgeon's recommendation for DC plan and follow-up therapies;Supervision for mobility/OOB(OPPT on 12/4)     Equipment Recommendations  Rolling walker with 5" wheels    Recommendations for Other Services       Precautions / Restrictions Precautions Precautions: Fall Required Braces or Orthoses: (KI d/ced because pt with adequate quad control) Restrictions Weight Bearing Restrictions: No Other Position/Activity Restrictions: WBAT    Mobility  Bed Mobility Overal bed mobility: Needs Assistance Bed Mobility: Supine to Sit     Supine to sit: HOB elevated;Min guard     General bed mobility comments: min guard for safety, verbal cuing for sequencing with increased time and effort to scoot to EOB.  Transfers Overall transfer level: Needs assistance Equipment used: Rolling walker (2 wheeled) Transfers: Sit to/from [facility] Sit to Stand: Min assist;From elevated surface Stand pivot transfers: Min assist;From elevated surface       General transfer comment: Min assist for power up, steadying, guiding pt to [facility] at EOB. Sit to stand x2 during session.  Ambulation/Gait Ambulation/Gait assistance: Min guard;+2 safety/equipment(chair follow) Gait Distance (Feet): 75  Feet Assistive device: Rolling walker (2 wheeled) Gait Pattern/deviations: Step-to pattern;Decreased step length - right;Decreased step length - left;Trunk flexed;Antalgic;Decreased weight shift to right;Decreased stance time - right;Step-through pattern Gait velocity: decr   General Gait Details: Min guard for safety, verbal cuing for sequencing, placement in RW, upright posture, step-through gait.   Stairs             Wheelchair Mobility    Modified Rankin (Stroke Patients Only)       Balance Overall balance assessment: Needs assistance;History of Falls(most recent fall 1 yr ago) Sitting-balance support: No upper extremity supported;Feet supported Sitting balance-Leahy Scale: Good     Standing balance support: Bilateral upper extremity supported;During functional activity Standing balance-Leahy Scale: Poor Standing balance comment: reliant on external support                            Cognition Arousal/Alertness: Awake/alert Behavior During Therapy: WFL for tasks assessed/performed Overall Cognitive Status: Within Functional Limits for tasks assessed                                        Exercises Total Joint Exercises Ankle Circles/Pumps: AROM;Both;Seated;10 reps Quad Sets: AROM;Right;10 reps;Seated Heel Slides: AAROM;Right;Seated;10 reps Hip ABduction/ADduction: AAROM;Right;10 reps;Seated Straight Leg Raises: AAROM;Right;5 reps;Seated    General Comments        Pertinent Vitals/Pain Pain Assessment: 0-10 Pain Score: 3  Pain Location: R knee, posterior Pain Descriptors / Indicators: Sore;Aching Pain Intervention(s): Limited activity within patient's tolerance;Monitored during session;Repositioned;Premedicated before session;Ice applied    Home Living  Prior Function            PT Goals (current goals can now be found in the care plan section) Acute Rehab PT Goals Patient Stated Goal: go  home to my cat PT Goal Formulation: With patient Time For Goal Achievement: 01/12/19 Potential to Achieve Goals: Good Progress towards PT goals: Progressing toward goals    Frequency    7X/week      PT Plan Current plan remains appropriate    Co-evaluation              AM-PAC PT "6 Clicks" Mobility   Outcome Measure  Help needed turning from your back to your side while in a flat bed without using bedrails?: A Little Help needed moving from lying on your back to sitting on the side of a flat bed without using bedrails?: A Little Help needed moving to and from a bed to a chair (including a wheelchair)?: A Little Help needed standing up from a chair using your arms (e.g., wheelchair or bedside chair)?: A Little Help needed to walk in hospital room?: A Little Help needed climbing 3-5 steps with a railing? : A Little 6 Click Score: 18    End of Session Equipment Utilized During Treatment: Gait belt Activity Tolerance: Patient tolerated treatment well;Patient limited by pain;Patient limited by fatigue Patient left: in chair;with chair alarm set;with call bell/phone within reach Nurse Communication: Mobility status PT Visit Diagnosis: Other abnormalities of gait and mobility (R26.89);Difficulty in walking, not elsewhere classified (R26.2)     Time: 6568-1275 PT Time Calculation (min) (ACUTE ONLY): 36 min  Charges:  $Gait Training: 8-22 mins $Therapeutic Exercise: 8-22 mins                     Celene Pippins E, PT [place] Pager [phone]  Office [phone]    [place] 01/06/2019, 11:45 AM

## 2019-01-06 NOTE — Progress Notes (Signed)
Physical Therapy Treatment Patient Details Name: Katie Katie [name] MRN: [id number] DOB: [birth year] Today's Date: 01/06/2019    History of Present Illness 78 yo female s/p R TKR on 01/05/19. 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    PT Comments    Pt with progressed ambulation distance to 125 ft with RW this session, requires increased time to perform but performs ambulation safely without LOB noted. Pt completed stair training without difficulty this session, and engaged in x2 repeated step practice to ensure pt comfort with it. PT administered, reviewed, and practiced TKR exercise handout with pt, pt with no further questions. Pt appropriate to Katie [name]/c from acute setting from PT standpoint.    Follow Up Recommendations  Follow surgeon's recommendation for DC plan and follow-up therapies;Supervision for mobility/OOB(OPPT on 12/4)     Equipment Recommendations  Rolling walker with 5" wheels    Recommendations for Other Services       Precautions / Restrictions Precautions Precautions: Fall Required Braces or Orthoses: (KI Katie [name]/ced because pt with adequate quad control) Restrictions Weight Bearing Restrictions: No Other Position/Activity Restrictions: WBAT    Mobility  Bed Mobility Overal bed mobility: Needs Assistance             General bed mobility comments: pt in chair upon arrival to room, requesting stay in chair after PT exit.  Transfers Overall transfer level: Needs assistance Equipment used: Rolling walker (2 wheeled) Transfers: Sit to/from Stand Sit to Stand: From elevated surface;Min guard         General transfer comment: min guard for safety, increased time to rise and self-steady. Sit to stand x2, once from recliner and once from [facility].  Ambulation/Gait Ambulation/Gait assistance: Min guard(chair follow) Gait Distance (Feet): 125 Feet Assistive device: Rolling walker (2 wheeled) Gait  Pattern/deviations: Trunk flexed;Antalgic;Decreased weight shift to right;Decreased stance time - right;Step-through pattern;Decreased stride length Gait velocity: decr   General Gait Details: min guard to supervision for safety, verbal cuing for step-through gait with appropriate placement in RW x1.   Stairs Stairs: Yes Stairs assistance: Min guard Stair Management: No rails;Step to pattern;With walker Number of Stairs: 1(2x1 step) General stair comments: min guard for safety, verbal cuing for sequencing (ascending with LLE leading, descending with RLE leading), caregiver steadying of RW across cross bar during step navigation.   Wheelchair Mobility    Modified Rankin (Stroke Patients Only)       Balance Overall balance assessment: Needs assistance;History of Falls(most recent fall 1 yr ago) Sitting-balance support: No upper extremity supported;Feet supported Sitting balance-Leahy Scale: Good     Standing balance support: Bilateral upper extremity supported;During functional activity Standing balance-Leahy Scale: Poor Standing balance comment: reliant on external support                            Cognition Arousal/Alertness: Awake/alert Behavior During Therapy: WFL for tasks assessed/performed Overall Cognitive Status: Within Functional Limits for tasks assessed                                        Exercises Total Joint Exercises Ankle Circles/Pumps: AROM;Both;Seated;10 reps Quad Sets: AROM;Right;10 reps;Seated Short Arc Quad: AAROM;Right;10 reps;Seated Heel Slides: AAROM;Right;Seated;10 reps Hip ABduction/ADduction: AAROM;Right;10 reps;Seated Straight Leg Raises: AAROM;Right;5 reps;Seated Knee Flexion: AAROM;Right;10 reps;Seated(with towel under foot, manual assist from PT to aide in sliding)    General  Comments        Pertinent Vitals/Pain Pain Assessment: 0-10 Pain Score: 3  Pain Location: R knee, posterior Pain Descriptors /  Indicators: Sore;Aching Pain Intervention(s): Limited activity within patient's tolerance;Monitored during session;Premedicated before session;Repositioned;Ice applied    Home Living                      Prior Function            PT Goals (current goals can now be found in the care plan section) Acute Rehab PT Goals Patient Stated Goal: go home to my cat PT Goal Formulation: With patient Time For Goal Achievement: 01/12/19 Potential to Achieve Goals: Good Progress towards PT goals: Progressing toward goals    Frequency    7X/week      PT Plan Current plan remains appropriate    Co-evaluation              AM-PAC PT "6 Clicks" Mobility   Outcome Measure  Help needed turning from your back to your side while in a flat bed without using bedrails?: A Little Help needed moving from lying on your back to sitting on the side of a flat bed without using bedrails?: A Little Help needed moving to and from a bed to a chair (including a wheelchair)?: A Little Help needed standing up from a chair using your arms (e.g., wheelchair or bedside chair)?: A Little Help needed to walk in hospital room?: A Little Help needed climbing 3-5 steps with a railing? : A Little 6 Click Score: 18    End of Session Equipment Utilized During Treatment: Gait belt Activity Tolerance: Patient tolerated treatment well;Patient limited by pain;Patient limited by fatigue Patient left: in chair;with chair alarm set;with call bell/phone within reach Nurse Communication: Mobility status PT Visit Diagnosis: Other abnormalities of gait and mobility (R26.89);Difficulty in walking, not elsewhere classified (R26.2)     Time: 9242-6834 PT Time Calculation (min) (ACUTE ONLY): 40 min  Charges:  $Gait Training: 23-37 mins $Therapeutic Exercise: 8-22 mins                     Katie Katie [name] E, PT Acute Rehabilitation Services Pager [phone]  Office [phone]    Katie Katie [name] Elonda Husky 01/06/2019, 3:40  PM

## 2019-01-06 NOTE — Care Management Obs Status (Signed)
[place] NOTIFICATION   Patient Details  Name: Katie [name] MRN: [id number] Date of Birth: Jun 01, 1940   Medicare Observation Status Notification Given:  Yes    Lia Hopping, [place] 01/06/2019, 10:19 AM

## 2019-01-06 NOTE — Progress Notes (Signed)
Subjective: 1 Day Post-Op Procedure(s) (LRB): TOTAL KNEE ARTHROPLASTY (Right) Patient reports pain as mild.   Patient seen in rounds by Dr. Wynelle Link. Patient is well, and has had no acute complaints or problems other than discomfort in the right knee. No acute events overnight. Patient ambulated 20 feet with PT yesterday. Foley catheter removed, positive flatus. Denies CP, SHOB.  We will continue therapy today.   Objective: Vital signs in last 24 hours: Temp:  [97.4 F (36.3 C)-98.7 F (37.1 C)] 98.7 F (37.1 C) (12/01 0505) Pulse Rate:  [64-71] 65 (12/01 0505) Resp:  [10-19] 18 (12/01 0505) BP: (117-171)/(61-98) 117/62 (12/01 0505) SpO2:  [97 %-100 %] 98 % (12/01 0505)  Intake/Output from previous day:  Intake/Output Summary (Last 24 hours) at 01/06/2019 0716 Last data filed at 01/06/2019 0600 Gross per 24 hour  Intake 3124.19 ml  Output 2840 ml  Net 284.19 ml     Intake/Output this shift: No intake/output data recorded.  Labs: Recent Labs    01/06/19 0241  HGB 9.4*   Recent Labs    01/06/19 0241  WBC 9.2  RBC 3.17*  HCT 29.4*  PLT 250   Recent Labs    01/06/19 0241  NA 128*  K 4.4  CL 99  CO2 22  BUN 11  CREATININE 0.69  GLUCOSE 115*  CALCIUM 8.2*   No results for input(s): LABPT, INR in the last 72 hours.  Exam: General - Patient is Alert and Oriented Extremity - Neurologically intact Sensation intact distally Intact pulses distally Dorsiflexion/Plantar flexion intact Dressing - dressing C/D/I Motor Function - intact, moving foot and toes well on exam.   Past Medical History:  Diagnosis Date  . Arthritis   . Cancer [facility])    Endometrial  . Complication of anesthesia    very hard to wake up from Anesthesia- then when wakes up feels like she is going to faint or have vomiting  . Family history of adverse reaction to anesthesia    daughter has a hard time waking up from Anesthesia  . GERD (gastroesophageal reflux disease)   . Glaucoma    . Hypothyroidism   . 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 PONV (postoperative nausea and vomiting)   . Thyroid disease     Assessment/Plan: 1 Day Post-Op Procedure(s) (LRB): TOTAL KNEE ARTHROPLASTY (Right) Principal Problem:   OA (osteoarthritis) of knee  Estimated body mass index is 26.84 kg/m as calculated from the following:   Height as of this encounter: 5\' 1"  (1.549 m).   Weight as of this encounter: 64.4 kg. Advance diet Up with therapy D/C IV fluids  Anticipated LOS equal to or greater than 2 midnights due to - Age 69 and older with one or more of the following:  - Obesity  - Expected need for hospital services (PT, OT, Nursing) required for safe  discharge  - Anticipated need for postoperative skilled nursing care or inpatient rehab  - Active co-morbidities: None OR   - Unanticipated findings during/Post Surgery: None  - Patient is a high risk of re-admission due to: None    DVT Prophylaxis - Aspirin Weight bearing as tolerated. D/C O2 and pulse ox and try on room air. Hemovac pulled without difficulty, will begin therapy today.  Plan is to go Home after hospital stay. Plan for discharge today after 2 sessions of therapy as long as she is meeting her goals. Scheduled for OPPT. She  will follow up in the office in 2 weeks.   Griffith Citron, PA-C Orthopedic Surgery [phone] 01/06/2019, 7:16 AM

## 2019-01-06 NOTE — Plan of Care (Signed)
Patient discharged in stable condition.

## 2019-01-06 NOTE — Care Management CC44 (Signed)
Condition Code 44 Documentation Completed  Patient Details  Name: Nyeisha Goodall MRN: [id number] Date of Birth: 10/04/1940   Condition Code 44 given:  Yes Patient signature on Condition Code 44 notice:  Yes Documentation of 2 MD's agreement:  Yes Code 44 added to claim:  Yes    Lia Hopping, LCSW 01/06/2019, 10:19 AM

## 2019-01-07 NOTE — Discharge Summary (Signed)
Physician Discharge Summary   Patient ID: Katie [name] MRN: [id number] DOB/AGE: 1940-04-21 78 y.o.  Admit date: 01/05/2019 Discharge date: 01/06/2019  Primary Diagnosis: Osteoarthritis Right knee(s)  Admission Diagnoses:  Past Medical History:  Diagnosis Date  . Arthritis   . Cancer [facility])    Endometrial  . Complication of anesthesia    very hard to wake up from Anesthesia- then when wakes up feels like she is going to faint or have vomiting  . Family history of adverse reaction to anesthesia    daughter has a hard time waking up from Anesthesia  . GERD (gastroesophageal reflux disease)   . Glaucoma   . Hypothyroidism   . 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 PONV (postoperative nausea and vomiting)   . Thyroid disease    Discharge Diagnoses:   Principal Problem:   OA (osteoarthritis) of knee  Estimated body mass index is 26.84 kg/m as calculated from the following:   Height as of this encounter: 5\' 1"  (1.549 m).   Weight as of this encounter: 64.4 kg.  Procedure:  Procedure(s) (LRB): TOTAL KNEE ARTHROPLASTY (Right)   Consults: None  HPI: Katie [name] is a 78 y.o. year old female with end stage OA of her right knee with progressively worsening pain and dysfunction. She has constant pain, with activity and at rest and significant functional deficits with difficulties even with ADLs. She has had extensive non-op management including analgesics, injections of cortisone and viscosupplements, and home exercise program, but remains in significant pain with significant dysfunction.Radiographs show bone on bone arthritis medial and patellofemoral. She presents now for right Total Knee Arthroplasty.    Laboratory Data: Admission on 01/05/2019, Discharged on 01/06/2019  Component Date Value Ref Range Status  . WBC 01/06/2019 9.2  4.0 - 10.5 K/uL Final  . RBC 01/06/2019 3.17* 3.87 - 5.11 MIL/uL Final  .  Hemoglobin 01/06/2019 9.4* 12.0 - 15.0 g/dL Final  . HCT 01/06/2019 29.4* 36.0 - 46.0 % Final  . MCV 01/06/2019 92.7  80.0 - 100.0 fL Final  . MCH 01/06/2019 29.7  26.0 - 34.0 pg Final  . MCHC 01/06/2019 32.0  30.0 - 36.0 g/dL Final  . RDW 01/06/2019 12.8  11.5 - 15.5 % Final  . Platelets 01/06/2019 250  150 - 400 K/uL Final  . nRBC 01/06/2019 0.0  0.0 - 0.2 % Final   Performed at [facility], [place] [address] [place], [place] [address]  . Sodium 01/06/2019 128* 135 - 145 mmol/L Final  . Potassium 01/06/2019 4.4  3.5 - 5.1 mmol/L Final  . Chloride 01/06/2019 99  98 - 111 mmol/L Final  . CO2 01/06/2019 22  22 - 32 mmol/L Final  . Glucose, Bld 01/06/2019 115* 70 - 99 mg/dL Final  . BUN 01/06/2019 11  8 - 23 mg/dL Final  . Creatinine, Ser 01/06/2019 0.69  0.44 - 1.00 mg/dL Final  . Calcium 01/06/2019 8.2* 8.9 - 10.3 mg/dL Final  . GFR calc non Af Amer 01/06/2019 >60  >60 mL/min Final  . GFR calc Af Amer 01/06/2019 >60  >60 mL/min Final  . Anion gap 01/06/2019 7  5 - 15 Final   Performed at [facility], [place] [address], [place], [place] [address]  Hospital Outpatient Visit on 01/02/2019  Component Date Value Ref Range Status  . SARS Coronavirus 2 01/02/2019 NEGATIVE  NEGATIVE Final   Comment: (NOTE) SARS-CoV-2 target nucleic acids  are NOT DETECTED. The SARS-CoV-2 RNA is generally detectable in upper and lower respiratory specimens during the acute phase of infection. Negative results do not preclude SARS-CoV-2 infection, do not rule out co-infections with other pathogens, and should not be used as the sole basis for treatment or other patient management decisions. Negative results must be combined with clinical observations, patient history, and epidemiological information. The expected result is Negative. Fact Sheet for Patients: SugarRoll.be Fact Sheet for Healthcare Providers:  https://www.woods-mathews.com/ This test is not yet approved or cleared by the [place] FDA and  has been authorized for detection and/or diagnosis of SARS-CoV-2 by FDA under an Emergency Use Authorization (EUA). This EUA will remain  in effect (meaning this test can be used) for the duration of the COVID-19 declaration under Section 56                          4(b)(1) of the Act, 21 U.S.C. section 360bbb-3(b)(1), unless the authorization is terminated or revoked sooner. Performed at [facility], [place] [address], [place], [place] [address]   Hospital Outpatient Visit on 12/29/2018  Component Date Value Ref Range Status  . MRSA, PCR 12/29/2018 NEGATIVE  NEGATIVE Final  . Staphylococcus aureus 12/29/2018 NEGATIVE  NEGATIVE Final   Comment: (NOTE) The Xpert SA Assay (FDA approved for NASAL specimens in patients 95 years of age and older), is one component of a comprehensive surveillance program. It is not intended to diagnose infection nor to guide or monitor treatment. Performed at [facility], [place] [address], [place], [place] [address]   . aPTT 12/29/2018 30  24 - 36 seconds Final   Performed at [facility], [place] [address] [place], [place] [address]  . WBC 12/29/2018 6.0  4.0 - 10.5 K/uL Final  . RBC 12/29/2018 4.22  3.87 - 5.11 MIL/uL Final  . Hemoglobin 12/29/2018 12.4  12.0 - 15.0 g/dL Final  . HCT 12/29/2018 39.4  36.0 - 46.0 % Final  . MCV 12/29/2018 93.4  80.0 - 100.0 fL Final  . MCH 12/29/2018 29.4  26.0 - 34.0 pg Final  . MCHC 12/29/2018 31.5  30.0 - 36.0 g/dL Final  . RDW 12/29/2018 12.9  11.5 - 15.5 % Final  . Platelets 12/29/2018 334  150 - 400 K/uL Final  . nRBC 12/29/2018 0.0  0.0 - 0.2 % Final  . Neutrophils Relative % 12/29/2018 63  % Final  . Neutro Abs 12/29/2018 3.8  1.7 - 7.7 K/uL Final  . Lymphocytes Relative 12/29/2018 25  % Final  . Lymphs Abs 12/29/2018 1.5  0.7 - 4.0 K/uL Final  . Monocytes  Relative 12/29/2018 9  % Final  . Monocytes Absolute 12/29/2018 0.5  0.1 - 1.0 K/uL Final  . Eosinophils Relative 12/29/2018 3  % Final  . Eosinophils Absolute 12/29/2018 0.2  0.0 - 0.5 K/uL Final  . Basophils Relative 12/29/2018 0  % Final  . Basophils Absolute 12/29/2018 0.0  0.0 - 0.1 K/uL Final  . Immature Granulocytes 12/29/2018 0  % Final  . Abs Immature Granulocytes 12/29/2018 0.01  0.00 - 0.07 K/uL Final   Performed at [facility], [place] [address] [place], [place] [address]  . Sodium 12/29/2018 132* 135 - 145 mmol/L Final  . Potassium 12/29/2018 4.8  3.5 - 5.1 mmol/L Final  . Chloride 12/29/2018 99  98 - 111 mmol/L Final  . CO2 12/29/2018 24  22 - 32  mmol/L Final  . Glucose, Bld 12/29/2018 96  70 - 99 mg/dL Final  . BUN 12/29/2018 13  8 - 23 mg/dL Final  . Creatinine, Ser 12/29/2018 0.72  0.44 - 1.00 mg/dL Final  . Calcium 12/29/2018 9.4  8.9 - 10.3 mg/dL Final  . Total Protein 12/29/2018 6.9  6.5 - 8.1 g/dL Final  . Albumin 12/29/2018 3.8  3.5 - 5.0 g/dL Final  . AST 12/29/2018 25  15 - 41 U/L Final  . ALT 12/29/2018 22  0 - 44 U/L Final  . Alkaline Phosphatase 12/29/2018 38  38 - 126 U/L Final  . Total Bilirubin 12/29/2018 0.6  0.3 - 1.2 mg/dL Final  . GFR calc non Af Amer 12/29/2018 >60  >60 mL/min Final  . GFR calc Af Amer 12/29/2018 >60  >60 mL/min Final  . Anion gap 12/29/2018 9  5 - 15 Final   Performed at [facility], [place] [address] [place], [place] [address]  . Prothrombin Time 12/29/2018 12.7  11.4 - 15.2 seconds Final  . INR 12/29/2018 1.0  0.8 - 1.2 Final   Comment: (NOTE) INR goal varies based on device and disease states. Performed at [facility], [place] [address], [place], [place] [address]   . ABO/RH(D) 12/29/2018 A POS   Final  . Antibody Screen 12/29/2018 NEG   Final  . Sample Expiration 12/29/2018 01/08/2019,2359   Final  . Extend sample reason 12/29/2018    Final                   Value:NO  TRANSFUSIONS OR PREGNANCY IN THE PAST 3 MONTHS Performed at [place] [address], [place], [place] [address]   . ABO/RH(D) 12/29/2018    Final                   Value:A POS Performed at [facility], [place] [address], [place], [place] [address]   Office Visit on 12/16/2018  Component Date Value Ref Range Status  . WBC 12/16/2018 6.1  4.0 - 10.5 K/uL Final  . RBC 12/16/2018 4.04  3.87 - 5.11 Mil/uL Final  . Platelets 12/16/2018 320.0  150.0 - 400.0 K/uL Final  . Hemoglobin 12/16/2018 12.2  12.0 - 15.0 g/dL Final  . HCT 12/16/2018 36.7  36.0 - 46.0 % Final  . MCV 12/16/2018 90.8  78.0 - 100.0 fl Final  . MCHC 12/16/2018 33.2  30.0 - 36.0 g/dL Final  . RDW 12/16/2018 13.5  11.5 - 15.5 % Final  . Sodium 12/16/2018 135  135 - 145 mEq/L Final  . Potassium 12/16/2018 3.7  3.5 - 5.1 mEq/L Final  . Chloride 12/16/2018 101  96 - 112 mEq/L Final  . CO2 12/16/2018 27  19 - 32 mEq/L Final  . Glucose, Bld 12/16/2018 127* 70 - 99 mg/dL Final  . BUN 12/16/2018 17  6 - 23 mg/dL Final  . Creatinine, Ser 12/16/2018 0.72  0.40 - 1.20 mg/dL Final  . GFR 12/16/2018 77.91  >60.00 mL/min Final  . Calcium 12/16/2018 9.3  8.4 - 10.5 mg/dL Final  . VITD 12/16/2018 41.99  30.00 - 100.00 ng/mL Final     X-Rays:Dg Chest 2 View  Result Date: 12/25/2018 CLINICAL DATA:  Endometrial carcinoma. Pre-op respiratory exam for knee replacement surgery. EXAM: CHEST - 2 VIEW COMPARISON:  12/21/2017 FINDINGS: The heart size and mediastinal contours are within normal limits. Aortic atherosclerosis. Small nodular density in the peripheral right mid  lung is stable, consistent with benign etiology. No evidence of pulmonary infiltrate or edema. No evidence of pleural effusion. Mild thoracolumbar degenerative changes and scoliosis again noted. IMPRESSION: Stable exam.  No active cardiopulmonary disease. Electronically Signed   By: Marlaine Hind M.D.   On: 12/25/2018 17:28   Mm 3d Screen  Breast Bilateral  Result Date: 12/29/2018 CLINICAL DATA:  Screening. EXAM: DIGITAL SCREENING BILATERAL MAMMOGRAM WITH TOMO AND CAD COMPARISON:  Previous exam(s). ACR Breast Density Category b: There are scattered areas of fibroglandular density. FINDINGS: There are no findings suspicious for malignancy. Images were processed with CAD. IMPRESSION: No mammographic evidence of malignancy. A result letter of this screening mammogram will be mailed directly to the patient. RECOMMENDATION: Screening mammogram in one year. (Code:[id number]-B-01Y) BI-RADS CATEGORY  1: Negative. Electronically Signed   By: Zerita Boers M.D.   On: 12/29/2018 09:17    EKG: Orders placed or performed in visit on 12/16/18  . EKG 12-Lead     Hospital Course: Pinkie Manger is a 78 y.o. who was admitted to [facility]. They were brought to the operating room on 01/05/2019 and underwent Procedure(s): TOTAL KNEE ARTHROPLASTY.  Patient tolerated the procedure well and was later transferred to the recovery room and then to the orthopaedic floor for postoperative care. They were given PO and IV analgesics for pain control following their surgery. They were given 24 hours of postoperative antibiotics of  Anti-infectives (From admission, onward)   Start     Dose/Rate Route Frequency Ordered Stop   01/05/19 1300  ceFAZolin (ANCEF) IVPB 2g/100 mL premix     2 g 200 mL/hr over 30 Minutes Intravenous Every 6 hours 01/05/19 1025 01/05/19 1946   01/05/19 0600  ceFAZolin (ANCEF) IVPB 2g/100 mL premix     2 g 200 mL/hr over 30 Minutes Intravenous On call to O.R. 01/05/19 0533 01/05/19 0725     and started on DVT prophylaxis in the form of Aspirin.   PT and OT were ordered for total joint protocol. Discharge planning consulted to help with postop disposition and equipment needs.  Patient had a good night on the evening of surgery. They started to get up OOB with therapy on POD #0. Pt was seen during rounds and was ready to go home  pending progress with therapy. Hemovac drain was pulled without difficulty. She worked with therapy on POD #1 and was meeting her goals. Pt was discharged to home later that day in stable condition.  Diet: Regular diet Activity: WBAT Follow-up: in 2 weeks Disposition: Home Discharged Condition: good   Discharge Instructions    Call MD / Call 911   Complete by: As directed    If you experience chest pain or shortness of breath, CALL 911 and be transported to the hospital emergency room.  If you develope a fever above 101 F, pus (white drainage) or increased drainage or redness at the wound, or calf pain, call your surgeon's office.   Change dressing   Complete by: As directed    Change dressing on Wednesday, then change the dressing daily with sterile 4 x 4 inch gauze dressing and apply TED hose.   Constipation Prevention   Complete by: As directed    Drink plenty of fluids.  Prune juice may be helpful.  You may use a stool softener, such as Colace (over the counter) 100 mg twice a day.  Use MiraLax (over the counter) for constipation as needed.   Diet - low sodium heart healthy  Complete by: As directed    Discharge instructions   Complete by: As directed    Dr. Gaynelle Arabian Total Joint Specialist [facility] [address], [place], [place] [address] [phone]  TOTAL KNEE REPLACEMENT POSTOPERATIVE DIRECTIONS  Knee Rehabilitation, Guidelines Following Surgery  Results after knee surgery are often greatly improved when you follow the exercise, range of motion and muscle strengthening exercises prescribed by your doctor. Safety measures are also important to protect the knee from further injury. Any time any of these exercises cause you to have increased pain or swelling in your knee joint, decrease the amount until you are comfortable again and slowly increase them. If you have problems or questions, call your caregiver or physical therapist for advice.   HOME CARE  INSTRUCTIONS  Remove items at home which could result in a fall. This includes throw rugs or furniture in walking pathways.  ICE to the affected knee every three hours for 30 minutes at a time and then as needed for pain and swelling.  Continue to use ice on the knee for pain and swelling from surgery. You may notice swelling that will progress down to the foot and ankle.  This is normal after surgery.  Elevate the leg when you are not up walking on it.   Continue to use the breathing machine which will help keep your temperature down.  It is common for your temperature to cycle up and down following surgery, especially at night when you are not up moving around and exerting yourself.  The breathing machine keeps your lungs expanded and your temperature down. Do not place pillow under knee, focus on keeping the knee straight while resting   DIET You may resume your previous home diet once your are discharged from the hospital.  DRESSING / WOUND CARE / SHOWERING You may change your dressing 3-5 days after surgery.  Then change the dressing every day with sterile gauze.  Please use good hand washing techniques before changing the dressing.  Do not use any lotions or creams on the incision until instructed by your surgeon. You may start showering once you are discharged home but do not submerge the incision under water. Just pat the incision dry and apply a dry gauze dressing on daily. Change the surgical dressing daily and reapply a dry dressing each time.  ACTIVITY Walk with your walker as instructed. Use walker as long as suggested by your caregivers. Avoid periods of inactivity such as sitting longer than an hour when not asleep. This helps prevent blood clots.  You may resume a sexual relationship in one month or when given the OK by your doctor.  You may return to work once you are cleared by your doctor.  Do not drive a car for 6 weeks or until released by you surgeon.  Do not drive while  taking narcotics.  WEIGHT BEARING Weight bearing as tolerated with assist device (walker, cane, etc) as directed, use it as long as suggested by your surgeon or therapist, typically at least 4-6 weeks.  POSTOPERATIVE CONSTIPATION PROTOCOL Constipation - defined medically as fewer than three stools per week and severe constipation as less than one stool per week.  One of the most common issues patients have following surgery is constipation.  Even if you have a regular bowel pattern at home, your normal regimen is likely to be disrupted due to multiple reasons following surgery.  Combination of anesthesia, postoperative narcotics, change in appetite and fluid intake all  can affect your bowels.  In order to avoid complications following surgery, here are some recommendations in order to help you during your recovery period.  Colace (docusate) - Pick up an over-the-counter form of Colace or another stool softener and take twice a day as long as you are requiring postoperative pain medications.  Take with a full glass of water daily.  If you experience loose stools or diarrhea, hold the colace until you stool forms back up.  If your symptoms do not get better within 1 week or if they get worse, check with your doctor.  Dulcolax (bisacodyl) - Pick up over-the-counter and take as directed by the product packaging as needed to assist with the movement of your bowels.  Take with a full glass of water.  Use this product as needed if not relieved by Colace only.   MiraLax (polyethylene glycol) - Pick up over-the-counter to have on hand.  MiraLax is a solution that will increase the amount of water in your bowels to assist with bowel movements.  Take as directed and can mix with a glass of water, juice, soda, coffee, or tea.  Take if you go more than two days without a movement. Do not use MiraLax more than once per day. Call your doctor if you are still constipated or irregular after using this medication for 7  days in a row.  If you continue to have problems with postoperative constipation, please contact the office for further assistance and recommendations.  If you experience "the worst abdominal pain ever" or develop nausea or vomiting, please contact the office immediatly for further recommendations for treatment.  ITCHING  If you experience itching with your medications, try taking only a single pain pill, or even half a pain pill at a time.  You can also use Benadryl over the counter for itching or also to help with sleep.   TED HOSE STOCKINGS Wear the elastic stockings on both legs for three weeks following surgery during the day but you may remove then at night for sleeping.  MEDICATIONS See your medication summary on the "After Visit Summary" that the nursing staff will review with you prior to discharge.  You may have some home medications which will be placed on hold until you complete the course of blood thinner medication.  It is important for you to complete the blood thinner medication as prescribed by your surgeon.  Continue your approved medications as instructed at time of discharge.  PRECAUTIONS If you experience chest pain or shortness of breath - call 911 immediately for transfer to the hospital emergency department.  If you develop a fever greater that 101 F, purulent drainage from wound, increased redness or drainage from wound, foul odor from the wound/dressing, or calf pain - CONTACT YOUR SURGEON.                                                   FOLLOW-UP APPOINTMENTS Make sure you keep all of your appointments after your operation with your surgeon and caregivers. You should call the office at the above phone number and make an appointment for approximately two weeks after the date of your surgery or on the date instructed by your surgeon outlined in the "After Visit Summary".   RANGE OF MOTION AND STRENGTHENING EXERCISES  Rehabilitation of the knee is important following  a  knee injury or an operation. After just a few days of immobilization, the muscles of the thigh which control the knee become weakened and shrink (atrophy). Knee exercises are designed to build up the tone and strength of the thigh muscles and to improve knee motion. Often times heat used for twenty to thirty minutes before working out will loosen up your tissues and help with improving the range of motion but do not use heat for the first two weeks following surgery. These exercises can be done on a training (exercise) mat, on the floor, on a table or on a bed. Use what ever works the best and is most comfortable for you Knee exercises include:  Leg Lifts - While your knee is still immobilized in a splint or cast, you can do straight leg raises. Lift the leg to 60 degrees, hold for 3 sec, and slowly lower the leg. Repeat 10-20 times 2-3 times daily. Perform this exercise against resistance later as your knee gets better.  Quad and Hamstring Sets - Tighten up the muscle on the front of the thigh (Quad) and hold for 5-10 sec. Repeat this 10-20 times hourly. Hamstring sets are done by pushing the foot backward against an object and holding for 5-10 sec. Repeat as with quad sets.  Leg Slides: Lying on your back, slowly slide your foot toward your buttocks, bending your knee up off the floor (only go as far as is comfortable). Then slowly slide your foot back down until your leg is flat on the floor again. Angel Wings: Lying on your back spread your legs to the side as far apart as you can without causing discomfort.  A rehabilitation program following serious knee injuries can speed recovery and prevent re-injury in the future due to weakened muscles. Contact your doctor or a physical therapist for more information on knee rehabilitation.   IF YOU ARE TRANSFERRED TO A SKILLED REHAB FACILITY If the patient is transferred to a skilled rehab facility following release from the hospital, a list of the current  medications will be sent to the facility for the patient to continue.  When discharged from the skilled rehab facility, please have the facility set up the patient's [place] prior to being released. Also, the skilled facility will be responsible for providing the patient with their medications at time of release from the facility to include their pain medication, the muscle relaxants, and their blood thinner medication. If the patient is still at the rehab facility at time of the two week follow up appointment, the skilled rehab facility will also need to assist the patient in arranging follow up appointment in our office and any transportation needs.  MAKE SURE YOU:  Understand these instructions.  Get help right away if you are not doing well or get worse.    Pick up stool softner and laxative for home use following surgery while on pain medications. Do not submerge incision under water. Please use good hand washing techniques while changing dressing each day. May shower starting three days after surgery. Please use a clean towel to pat the incision dry following showers. Continue to use ice for pain and swelling after surgery. Do not use any lotions or creams on the incision until instructed by your surgeon.   Do not put a pillow under the knee. Place it under the heel.   Complete by: As directed    Driving restrictions   Complete by: As directed  No driving for two weeks   TED hose   Complete by: As directed    Use stockings (TED hose) for three weeks on both leg(s).  You may remove them at night for sleeping.   Weight bearing as tolerated   Complete by: As directed      Allergies as of 01/06/2019      Reactions   Sulfa Antibiotics Rash   Gabapentin Other (See Comments)   unknown   Lyrica [pregabalin] Other (See Comments)   unknown      Medication List    TAKE these medications   alendronate 70 MG tablet Commonly known as: FOSAMAX TAKE 1 TABLET BY  MOUTH EVERY 7 DAYS. TAKE WITH A FULL GLASS OF WATER ON AN EMPTY STOMACH What changed: See the Katie instructions.   Alphagan P 0.1 % Soln Generic drug: brimonidine Place 1 drop into both eyes 2 (two) times daily. Can not take generic   aspirin 325 MG EC tablet Take 1 tablet (325 mg total) by mouth 2 (two) times daily for 20 days. Take one tablet (325 mg) Aspirin two times a day for three weeks following surgery.Then take one baby Aspirin (81 mg) once a day for three weeks.Then discontinue aspirin.   CALCIUM CITRATE + D PO Take 1 tablet by mouth 2 (two) times daily.   cholestyramine 4 g packet Commonly known as: QUESTRAN MIX AND DRINK 1 PACKET BY MOUTH TWICE DAILY What changed: See the Katie instructions.   esomeprazole 20 MG capsule Commonly known as: NEXIUM Take 20 mg by mouth daily.   levothyroxine 75 MCG tablet Commonly known as: SYNTHROID Take 1 tablet (75 mcg total) by mouth daily.   Lumigan 0.01 % Soln Generic drug: bimatoprost Place 1 drop into both eyes at bedtime. Can not take Generic   methocarbamol 500 MG tablet Commonly known as: ROBAXIN Take 1 tablet (500 mg total) by mouth every 6 (six) hours as needed for muscle spasms.   oxyCODONE 5 MG immediate release tablet Commonly known as: Oxy IR/ROXICODONE Take 1-2 tablets (5-10 mg total) by mouth every 6 (six) hours as needed for severe pain.   pyridOXINE 100 MG tablet Commonly known as: VITAMIN B-6 Take 100 mg by mouth daily.   traMADol 50 MG tablet Commonly known as: ULTRAM Take 1-2 tablets (50-100 mg total) by mouth every 6 (six) hours as needed for moderate pain.   Vitamin D (Ergocalciferol) 1.25 MG (50000 UT) Caps capsule Commonly known as: DRISDOL Take 1 capsule (50,000 Units total) by mouth every 7 (seven) days. What changed: additional instructions            Discharge Care Instructions  (From admission, onward)         Start     Ordered   01/06/19 0000  Weight bearing as tolerated      01/06/19 0721   01/06/19 0000  Change dressing    Comments: Change dressing on Wednesday, then change the dressing daily with sterile 4 x 4 inch gauze dressing and apply TED hose.   01/06/19 0175         Follow-up Information    Gaynelle Arabian, MD. Go on 01/20/2019.   Specialty: Orthopedic Surgery Why: You are scheduled for a post-operative appointment on 01-20-19 at 1:30 pm. Contact information: [address] [place] [address] [phone]        Rosilyn Mings.. Go on 01/09/2019.   Why: You are scheduled for a physical therapy appointment on 01-09-19 at 1:45  pm.  Contact information: [place] [address] [phone]           Signed: Griffith Citron, PA-C Orthopedic Surgery 01/07/2019, 1:17 PM

## 2019-01-09 DIAGNOSIS — M25561 Pain in right knee: Secondary | ICD-10-CM | POA: Diagnosis not present

## 2019-01-12 DIAGNOSIS — M25561 Pain in right knee: Secondary | ICD-10-CM | POA: Diagnosis not present

## 2019-01-14 ENCOUNTER — Other Ambulatory Visit: Payer: Self-pay | Admitting: Family Medicine

## 2019-01-14 DIAGNOSIS — M810 Age-related osteoporosis without current pathological fracture: Secondary | ICD-10-CM

## 2019-01-15 DIAGNOSIS — M25561 Pain in right knee: Secondary | ICD-10-CM | POA: Diagnosis not present

## 2019-02-06 DIAGNOSIS — Z9189 Other specified personal risk factors, not elsewhere classified: Secondary | ICD-10-CM

## 2019-02-06 HISTORY — DX: Other specified personal risk factors, not elsewhere classified: Z91.89

## 2019-02-10 DIAGNOSIS — Z471 Aftercare following joint replacement surgery: Secondary | ICD-10-CM | POA: Diagnosis not present

## 2019-02-10 DIAGNOSIS — Z96651 Presence of right artificial knee joint: Secondary | ICD-10-CM | POA: Diagnosis not present

## 2019-02-10 DIAGNOSIS — M25561 Pain in right knee: Secondary | ICD-10-CM | POA: Diagnosis not present

## 2019-02-19 ENCOUNTER — Other Ambulatory Visit: Payer: Self-pay

## 2019-02-20 ENCOUNTER — Ambulatory Visit (INDEPENDENT_AMBULATORY_CARE_PROVIDER_SITE_OTHER): Payer: Medicare Other | Admitting: Family Medicine

## 2019-02-20 ENCOUNTER — Other Ambulatory Visit: Payer: Self-pay | Admitting: Family Medicine

## 2019-02-20 ENCOUNTER — Encounter: Payer: Self-pay | Admitting: Family Medicine

## 2019-02-20 ENCOUNTER — Encounter: Payer: Self-pay | Admitting: Podiatry

## 2019-02-20 ENCOUNTER — Ambulatory Visit (INDEPENDENT_AMBULATORY_CARE_PROVIDER_SITE_OTHER): Payer: Medicare Other | Admitting: Podiatry

## 2019-02-20 ENCOUNTER — Encounter: Payer: Self-pay | Admitting: Neurology

## 2019-02-20 VITALS — BP 126/84 | HR 74 | Temp 97.1°F | Wt 139.8 lb

## 2019-02-20 DIAGNOSIS — M810 Age-related osteoporosis without current pathological fracture: Secondary | ICD-10-CM

## 2019-02-20 DIAGNOSIS — E611 Iron deficiency: Secondary | ICD-10-CM

## 2019-02-20 DIAGNOSIS — G603 Idiopathic progressive neuropathy: Secondary | ICD-10-CM

## 2019-02-20 DIAGNOSIS — Z09 Encounter for follow-up examination after completed treatment for conditions other than malignant neoplasm: Secondary | ICD-10-CM

## 2019-02-20 DIAGNOSIS — L600 Ingrowing nail: Secondary | ICD-10-CM | POA: Diagnosis not present

## 2019-02-20 DIAGNOSIS — D649 Anemia, unspecified: Secondary | ICD-10-CM | POA: Diagnosis not present

## 2019-02-20 DIAGNOSIS — M79674 Pain in right toe(s): Secondary | ICD-10-CM | POA: Diagnosis not present

## 2019-02-20 DIAGNOSIS — B351 Tinea unguium: Secondary | ICD-10-CM | POA: Diagnosis not present

## 2019-02-20 DIAGNOSIS — E538 Deficiency of other specified B group vitamins: Secondary | ICD-10-CM

## 2019-02-20 DIAGNOSIS — G620 Drug-induced polyneuropathy: Secondary | ICD-10-CM

## 2019-02-20 DIAGNOSIS — M79675 Pain in left toe(s): Secondary | ICD-10-CM

## 2019-02-20 LAB — CBC
HCT: 34.8 % — ABNORMAL LOW (ref 36.0–46.0)
Hemoglobin: 11 g/dL — ABNORMAL LOW (ref 12.0–15.0)
MCHC: 31.6 g/dL (ref 30.0–36.0)
MCV: 88.3 fl (ref 78.0–100.0)
Platelets: 434 10*3/uL — ABNORMAL HIGH (ref 150.0–400.0)
RBC: 3.95 Mil/uL (ref 3.87–5.11)
RDW: 14.4 % (ref 11.5–15.5)
WBC: 7.7 10*3/uL (ref 4.0–10.5)

## 2019-02-20 LAB — BASIC METABOLIC PANEL
BUN: 16 mg/dL (ref 6–23)
CO2: 22 mEq/L (ref 19–32)
Calcium: 9.6 mg/dL (ref 8.4–10.5)
Chloride: 98 mEq/L (ref 96–112)
Creatinine, Ser: 0.77 mg/dL (ref 0.40–1.20)
GFR: 72.07 mL/min (ref >60.00)
Glucose, Bld: 89 mg/dL (ref 70–99)
Potassium: 4 mEq/L (ref 3.5–5.1)
Sodium: 132 mEq/L — ABNORMAL LOW (ref 135–145)

## 2019-02-20 LAB — B12 AND FOLATE PANEL
Folate: 10.3 ng/mL (ref >5.9)
Vitamin B-12: 143 pg/mL — ABNORMAL LOW (ref 211–911)

## 2019-02-20 MED ORDER — ALENDRONATE SODIUM 70 MG PO TABS: 70.0000 mg | ORAL_TABLET | ORAL | 1 refills | Status: DC

## 2019-02-20 NOTE — Progress Notes (Signed)
Subjective:  Patient ID: Katie [name], female    DOB: November 26, 1940,  MRN: [id number]  Chief Complaint  Patient presents with  . Nail Problem    bilateral hallux, ingrown - medial side . presents with redness and swelling . pt declines any drainage     79 y.o. female presents with the above complaint.  Patient presents with bilateral hallux medial ingrown has been causing a lot of pain.  Patient states is worse when ambulating.  She states that there is also pain with pressure.  She has tried ultrasound soaks which has not helped.  She also has secondary complaint of onychomycosis thickened elongated mycotic discolored toenails x10.    Review of Systems: Negative except as noted in the HPI. Denies N/V/F/Ch.  Past Medical History:  Diagnosis Date  . Arthritis   . Cancer [facility])    Endometrial  . Complication of anesthesia    very hard to wake up from Anesthesia- then when wakes up feels like she is going to faint or have vomiting  . Family history of adverse reaction to anesthesia    daughter has a hard time waking up from Anesthesia  . GERD (gastroesophageal reflux disease)   . Glaucoma   . Hypothyroidism   . 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 PONV (postoperative nausea and vomiting)   . Thyroid disease     Current Outpatient Medications:  .  alendronate (FOSAMAX) 70 MG tablet, Take 1 tablet (70 mg total) by mouth once a week. Give with at least 8 ounces of plain water 30 min prior to breakfast.  Patient should be instructed to stay upright (not to lie down) for at least 30 minutes and until after first food of the day (to reduce esophageal irritation). Wednesday, Disp: 12 tablet, Rfl: 1 .  bimatoprost (LUMIGAN) 0.01 % SOLN, Place 1 drop into both eyes at bedtime. Can not take Generic, Disp: , Rfl:  .  brimonidine (ALPHAGAN P) 0.1 % SOLN, Place 1 drop into both eyes 2 (two) times daily. Can not take generic, Disp:  , Rfl:  .  Calcium Citrate-Vitamin D (CALCIUM CITRATE + D PO), Take 1 tablet by mouth 2 (two) times daily. , Disp: , Rfl:  .  cholestyramine (QUESTRAN) 4 g packet, MIX AND DRINK 1 PACKET BY MOUTH TWICE DAILY (Patient taking differently: Take 4 g by mouth at bedtime. ), Disp: 180 each, Rfl: 1 .  esomeprazole (NEXIUM) 20 MG capsule, Take 20 mg by mouth daily. , Disp: , Rfl:  .  levothyroxine (SYNTHROID) 75 MCG tablet, Take 1 tablet (75 mcg total) by mouth daily., Disp: 90 tablet, Rfl: 3 .  pyridOXINE (VITAMIN B-6) 100 MG tablet, Take 100 mg by mouth daily., Disp: , Rfl:   Social History   Tobacco Use  Smoking Status Never Smoker  Smokeless Tobacco Never Used    Allergies  Allergen Reactions  . Sulfa Antibiotics Rash  . Gabapentin Other (See Comments)    unknown  . Lyrica [Pregabalin] Other (See Comments)    unknown   Objective:  There were no vitals filed for this visit. There is no height or weight on file to calculate BMI. Constitutional Well developed. Well nourished.  Vascular Dorsalis pedis pulses palpable bilaterally. Posterior tibial pulses palpable bilaterally. Capillary refill normal to all digits.  No cyanosis or clubbing noted. Pedal hair growth normal.  Neurologic Normal speech. Oriented to person, place, and time. Epicritic sensation to  light touch grossly present bilaterally.  Dermatologic Painful ingrowing nail at medial nail borders of the hallux nail bilaterally. No other open wounds. No skin lesions. Nail Exam: Pt has thick disfigured discolored nails with subungual debris noted bilateral entire nail hallux through fifth toenails  Orthopedic: Normal joint ROM without pain or crepitus bilaterally. No visible deformities. No bony tenderness.   Radiographs: None Assessment:   1. Peripheral neuropathy due to chemotherapy ([place])   2. Pain due to onychomycosis of toenails of both feet   3. Ingrown toenail of right foot   4. Ingrown left big toenail   5.  Great toe pain, right   6. Great toe pain, left    Plan:  Patient was evaluated and treated and all questions answered.  Ingrown Nail, bilaterally -Patient elects to proceed with minor surgery to remove ingrown toenail removal today. Consent reviewed and signed by patient. -Ingrown nail excised. See procedure note. -Educated on post-procedure care including soaking. Written instructions provided and reviewed. -Patient to follow up in 2 weeks for nail check.  Procedure: Excision of Ingrown Toenail Location: Bilateral 1st toe medial nail borders. Anesthesia: Lidocaine 1% plain; 1.5 mL and Marcaine 0.5% plain; 1.5 mL, digital block. Skin Prep: Betadine. Dressing: Silvadene; telfa; dry, sterile, compression dressing. Technique: Following skin prep, the toe was exsanguinated and a tourniquet was secured at the base of the toe. The affected nail border was freed, split with a nail splitter, and excised. Chemical matrixectomy was then performed with phenol and irrigated out with alcohol. The tourniquet was then removed and sterile dressing applied. Disposition: Patient tolerated procedure well. Patient to return in 2 weeks for follow-up.    Onychomycosis with pain  -Nails palliatively debrided as below. -Educated on self-care  Procedure: Nail Debridement Rationale: pain  Type of Debridement: manual, sharp debridement. Instrumentation: Nail nipper, rotary burr. Number of Nails: 10  Procedures and Treatment: Consent by patient was obtained for treatment procedures. The patient understood the discussion of treatment and procedures well. All questions were answered thoroughly reviewed. Debridement of mycotic and hypertrophic toenails, 1 through 5 bilateral and clearing of subungual debris. No ulceration, no infection noted.  Return Visit-Office Procedure: Patient instructed to return to the office for a follow up visit 3 months for continued evaluation and treatment.  Boneta Lucks, DPM     Return in about 2 weeks (around 03/06/2019).   Return in about 2 weeks (around 03/06/2019).

## 2019-02-20 NOTE — Progress Notes (Addendum)
Established Patient Office Visit  Subjective:  Patient ID: Katie [name], female    DOB: 11-30-1940  Age: 79 y.o. MRN: [id number]  CC:  Chief Complaint  Patient presents with  . Post-op Follow-up    Patient is here today for a 6-week-F/U after surgical procedure. She had a right total knee arth on 11.30.20. She states "If I could get rid of the neuropathy in my feet then I would be great other than that I'm fine."  . Osteoporosis    She was taken off of the Fosamax pre-surgery. She is asking if she can be changed to the injections because she has a hard time with the pills.    HPI [name] presents for for hospital discharge follow-up status post right TKR.  Patient is doing well.  She has been released.  Continues to ambulate with a cane.  Longstanding history of neuropathy persists.  If anything is holding her back it is that.  She has not tolerated treatment with gabapentin or pregabalin.  Review of the hospital record did show a drop in her hemoglobin to 9.4.  Transfusion was ordered but she does not believe that she received blood during her hospital stay.  Continues to take Fosamax and calcium with vitamin D.  Admits that sometimes she forgets to take the Fosamax.  Husband is situated in long-term care and things are going more smoothly for her at home save the recent death of her cat.  Past Medical History:  Diagnosis Date  . Arthritis   . Cancer [facility])    Endometrial  . Complication of anesthesia    very hard to wake up from Anesthesia- then when wakes up feels like she is going to faint or have vomiting  . Family history of adverse reaction to anesthesia    daughter has a hard time waking up from Anesthesia  . GERD (gastroesophageal reflux disease)   . Glaucoma   . Hypothyroidism   . 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 PONV (postoperative nausea and vomiting)   . Thyroid disease     Past  Surgical History:  Procedure Laterality Date  . ABDOMINAL HYSTERECTOMY    . APPENDECTOMY    . BREAST EXCISIONAL BIOPSY Right    benign, was a cyst  . BREAST SURGERY    . CHOLECYSTECTOMY    . FEMUR SURGERY  06/2013  . TOTAL KNEE ARTHROPLASTY Right 01/05/2019   Procedure: TOTAL KNEE ARTHROPLASTY;  Surgeon: Gaynelle Arabian, MD;  Location: [facility] ORS;  Service: Orthopedics;  Laterality: Right;  45min    Family History  Problem Relation Age of Onset  . Heart disease Father   . Hearing loss Maternal Grandmother   . Hearing loss Maternal Grandfather   . Cancer Paternal Grandfather   . Cancer Sister     Social History   Socioeconomic History  . Marital status: Married    Spouse name: Not on file  . Number of children: Not on file  . Years of education: Not on file  . Highest education level: Not on file  Occupational History  . Not on file  Tobacco Use  . Smoking status: Never Smoker  . Smokeless tobacco: Never Used  Substance and Sexual Activity  . Alcohol use: No  . Drug use: No  . Sexual activity: Not on file  Other Topics Concern  . Not on file  Social History Narrative  . Not on file  Social Determinants of Health   Financial Resource Strain:   . Difficulty of Paying Living Expenses: Not on file  Food Insecurity:   . Worried About Charity fundraiser in the Last Year: Not on file  . Ran Out of Food in the Last Year: Not on file  Transportation Needs:   . Lack of Transportation (Medical): Not on file  . Lack of Transportation (Non-Medical): Not on file  Physical Activity:   . Days of Exercise per Week: Not on file  . Minutes of Exercise per Session: Not on file  Stress:   . Feeling of Stress : Not on file  Social Connections:   . Frequency of Communication with Friends and Family: Not on file  . Frequency of Social Gatherings with Friends and Family: Not on file  . Attends Religious Services: Not on file  . Active Member of Clubs or Organizations: Not on file    . Attends Archivist Meetings: Not on file  . Marital Status: Not on file  Intimate Partner Violence:   . Fear of Current or Ex-Partner: Not on file  . Emotionally Abused: Not on file  . Physically Abused: Not on file  . Sexually Abused: Not on file    Outpatient Medications Prior to Visit  Medication Sig Dispense Refill  . bimatoprost (LUMIGAN) 0.01 % SOLN Place 1 drop into both eyes at bedtime. Can not take Generic    . brimonidine (ALPHAGAN P) 0.1 % SOLN Place 1 drop into both eyes 2 (two) times daily. Can not take generic    . Calcium Citrate-Vitamin D (CALCIUM CITRATE + D PO) Take 1 tablet by mouth 2 (two) times daily.     . cholestyramine (QUESTRAN) 4 g packet MIX AND DRINK 1 PACKET BY MOUTH TWICE DAILY (Patient taking differently: Take 4 g by mouth at bedtime. ) 180 each 1  . esomeprazole (NEXIUM) 20 MG capsule Take 20 mg by mouth daily.     Marland Kitchen levothyroxine (SYNTHROID) 75 MCG tablet Take 1 tablet (75 mcg total) by mouth daily. 90 tablet 3  . pyridOXINE (VITAMIN B-6) 100 MG tablet Take 100 mg by mouth daily.    Marland Kitchen alendronate (FOSAMAX) 70 MG tablet Take 1 tablet (70 mg total) by mouth once a week. Give with at least 8 ounces of plain water 30 min prior to breakfast.  Patient should be instructed to stay upright (not to lie down) for at least 30 minutes and until after first food of the day (to reduce esophageal irritation). Wednesday (Patient not taking: Reported on 02/20/2019) 12 tablet 1  . methocarbamol (ROBAXIN) 500 MG tablet Take 1 tablet (500 mg total) by mouth every 6 (six) hours as needed for muscle spasms. 40 tablet 0  . oxyCODONE (OXY IR/ROXICODONE) 5 MG immediate release tablet Take 1-2 tablets (5-10 mg total) by mouth every 6 (six) hours as needed for severe pain. 56 tablet 0  . traMADol (ULTRAM) 50 MG tablet Take 1-2 tablets (50-100 mg total) by mouth every 6 (six) hours as needed for moderate pain. 40 tablet 0  . Vitamin D, Ergocalciferol, (DRISDOL) 1.25 MG (50000  UT) CAPS capsule Take 1 capsule (50,000 Units total) by mouth every 7 (seven) days. (Patient taking differently: Take 50,000 Units by mouth every 7 (seven) days. Thursday evening) 5 capsule 6   No facility-administered medications prior to visit.    Allergies  Allergen Reactions  . Sulfa Antibiotics Rash  . Gabapentin Other (See Comments)  unknown  . Lyrica [Pregabalin] Other (See Comments)    unknown    ROS Review of Systems  Constitutional: Negative.   HENT: Negative.   Eyes: Negative for photophobia and visual disturbance.  Respiratory: Negative.   Cardiovascular: Negative.   Gastrointestinal: Negative.   Genitourinary: Negative.   Musculoskeletal: Positive for gait problem. Negative for arthralgias and back pain.  Allergic/Immunologic: Negative for immunocompromised state.  Neurological: Positive for numbness. Negative for speech difficulty and weakness.  Hematological: Does not bruise/bleed easily.  Psychiatric/Behavioral: Negative.       Objective:    Physical Exam  Constitutional: She is oriented to person, place, and time. She appears well-developed and well-nourished. No distress.  HENT:  Head: Normocephalic and atraumatic.  Right Ear: External ear normal.  Left Ear: External ear normal.  Eyes: Conjunctivae are normal. Right eye exhibits no discharge. Left eye exhibits no discharge. No scleral icterus.  Neck: No JVD present. No tracheal deviation present.  Cardiovascular: Normal rate, regular rhythm and normal heart sounds.  Pulmonary/Chest: Effort normal and breath sounds normal. No stridor.  Musculoskeletal:     Right knee: Normal range of motion.       Legs:  Neurological: She is alert and oriented to person, place, and time.  Skin: She is not diaphoretic.  Psychiatric: She has a normal mood and affect. Her behavior is normal.    BP 126/84 (BP Location: Left Arm, Patient Position: Sitting, Cuff Size: Normal)   Pulse 74   Temp (!) 97.1 F (36.2 C)  (Temporal)   Wt 139 lb 12.8 oz (63.4 kg)   SpO2 100%   BMI 26.41 kg/m  Wt Readings from Last 3 Encounters:  02/20/19 139 lb 12.8 oz (63.4 kg)  01/05/19 142 lb 1 oz (64.4 kg)  12/29/18 142 lb 1 oz (64.4 kg)     There are no preventive care reminders to display for this patient.  There are no preventive care reminders to display for this patient.  Lab Results  Component Value Date   TSH 2.09 11/10/2018   Lab Results  Component Value Date   WBC 7.7 02/20/2019   HGB 11.0 (L) 02/20/2019   HCT 34.8 (L) 02/20/2019   MCV 88.3 02/20/2019   PLT 434.0 (H) 02/20/2019   Lab Results  Component Value Date   NA 132 (L) 02/20/2019   K 4.0 02/20/2019   CO2 22 02/20/2019   GLUCOSE 89 02/20/2019   BUN 16 02/20/2019   CREATININE 0.77 02/20/2019   BILITOT 0.6 12/29/2018   ALKPHOS 38 12/29/2018   AST 25 12/29/2018   ALT 22 12/29/2018   PROT 6.9 12/29/2018   ALBUMIN 3.8 12/29/2018   CALCIUM 9.6 02/20/2019   ANIONGAP 7 01/06/2019   GFR 72.07 02/20/2019   No results found for: CHOL No results found for: HDL No results found for: LDLCALC No results found for: TRIG No results found for: CHOLHDL No results found for: HGBA1C    Assessment & Plan:   Problem List Items Addressed This Visit      Nervous and Auditory   Polyneuropathy, idiopathic progressive   Relevant Orders   B12 and Folate Panel (Completed)   Ambulatory referral to Neurology     Musculoskeletal and Integument   Age-related osteoporosis without current pathological fracture   Relevant Medications   alendronate (FOSAMAX) 70 MG tablet   Osteoporosis without current pathological fracture   Relevant Medications   alendronate (FOSAMAX) 70 MG tablet     Other   Hospital discharge  follow-up - Primary   Relevant Orders   Basic Metabolic Panel (BMET) (Completed)   CBC (Completed)   Iron, TIBC and Ferritin Panel (Completed)   B12 and Folate Panel (Completed)   Anemia   Relevant Medications   Iron, Ferrous  Sulfate, 325 (65 Fe) MG TABS   Other Relevant Orders   CBC (Completed)   Iron, TIBC and Ferritin Panel (Completed)   Iron deficiency   Relevant Medications   Iron, Ferrous Sulfate, 325 (65 Fe) MG TABS   B12 deficiency   Relevant Medications   b complex vitamins capsule      Meds ordered this encounter  Medications  . alendronate (FOSAMAX) 70 MG tablet    Sig: Take 1 tablet (70 mg total) by mouth once a week. Give with at least 8 ounces of plain water 30 min prior to breakfast.  Patient should be instructed to stay upright (not to lie down) for at least 30 minutes and until after first food of the day (to reduce esophageal irritation). Wednesday    Dispense:  12 tablet    Refill:  1  . b complex vitamins capsule    Sig: Take 1 capsule by mouth daily.    Dispense:  90 capsule    Refill:  1  . Iron, Ferrous Sulfate, 325 (65 Fe) MG TABS    Sig: Take 1 tablet by mouth every other day.    Dispense:  90 tablet    Refill:  1    Follow-up: Return in about 3 months (around 05/21/2019).   Continue calcium with vitamin D.  Other needed supplementation pending lab results. Libby Maw, MD

## 2019-02-21 LAB — IRON,TIBC AND FERRITIN PANEL
%SAT: 9 % (calc) — ABNORMAL LOW (ref 16–45)
Ferritin: 30 ng/mL (ref 16–288)
Iron: 40 ug/dL — ABNORMAL LOW (ref 45–160)
TIBC: 456 mcg/dL (calc) — ABNORMAL HIGH (ref 250–450)

## 2019-02-23 DIAGNOSIS — E538 Deficiency of other specified B group vitamins: Secondary | ICD-10-CM | POA: Insufficient documentation

## 2019-02-23 DIAGNOSIS — E611 Iron deficiency: Secondary | ICD-10-CM | POA: Insufficient documentation

## 2019-02-23 MED ORDER — B COMPLEX VITAMINS PO CAPS: 1.0000 | ORAL_CAPSULE | Freq: Every day | ORAL | 1 refills | Status: DC

## 2019-02-23 MED ORDER — IRON (FERROUS SULFATE) 325 (65 FE) MG PO TABS: 1.0000 | ORAL_TABLET | ORAL | 1 refills | Status: DC

## 2019-02-23 NOTE — Addendum Note (Signed)
Addended by: Abelino Derrick A on: 02/23/2019 09:30 AM   Modules accepted: Orders

## 2019-02-25 ENCOUNTER — Ambulatory Visit: Payer: Medicare Other | Attending: Internal Medicine

## 2019-02-25 DIAGNOSIS — Z23 Encounter for immunization: Secondary | ICD-10-CM | POA: Insufficient documentation

## 2019-02-25 NOTE — Progress Notes (Signed)
   Covid-19 Vaccination Clinic  Name:  Brendaliz Kuk    MRN: [id number] DOB: 1940-09-01  02/25/2019  Ms. Roa was observed post Covid-19 immunization for 15 minutes without incidence. She was provided with Vaccine Information Sheet and instruction to access the V-Safe system.   Ms. Pete was instructed to call 911 with any severe reactions post vaccine: Marland Kitchen Difficulty breathing  . Swelling of your face and throat  . A fast heartbeat  . A bad rash all over your body  . Dizziness and weakness    Immunizations Administered    Name Date Dose VIS Date Route   Pfizer COVID-19 Vaccine 02/25/2019  4:02 PM 0.3 mL 01/16/2019 Intramuscular   Manufacturer: [place]   Lot: [id number]   [place]: [id number]

## 2019-03-02 ENCOUNTER — Telehealth: Payer: Self-pay | Admitting: Family Medicine

## 2019-03-02 NOTE — Telephone Encounter (Signed)
B complex tabs sent to pharmacy. Dosage is up to 1054mcg daily.

## 2019-03-02 NOTE — Telephone Encounter (Signed)
Left message on identified VM informing pt that Bcomplex was sent to her pharmacy.

## 2019-03-02 NOTE — Telephone Encounter (Signed)
Pt anted to know the dose of B12 she needs to get from over the counter, Please call pt back at [phone] and let know.

## 2019-03-06 DIAGNOSIS — H2513 Age-related nuclear cataract, bilateral: Secondary | ICD-10-CM | POA: Diagnosis not present

## 2019-03-06 DIAGNOSIS — H401133 Primary open-angle glaucoma, bilateral, severe stage: Secondary | ICD-10-CM | POA: Diagnosis not present

## 2019-03-06 DIAGNOSIS — H5203 Hypermetropia, bilateral: Secondary | ICD-10-CM | POA: Diagnosis not present

## 2019-03-09 ENCOUNTER — Ambulatory Visit (INDEPENDENT_AMBULATORY_CARE_PROVIDER_SITE_OTHER): Payer: Medicare Other | Admitting: Podiatry

## 2019-03-09 ENCOUNTER — Other Ambulatory Visit: Payer: Self-pay

## 2019-03-09 ENCOUNTER — Other Ambulatory Visit: Payer: Self-pay | Admitting: Family Medicine

## 2019-03-09 DIAGNOSIS — E039 Hypothyroidism, unspecified: Secondary | ICD-10-CM

## 2019-03-09 DIAGNOSIS — L600 Ingrowing nail: Secondary | ICD-10-CM

## 2019-03-09 DIAGNOSIS — M79674 Pain in right toe(s): Secondary | ICD-10-CM | POA: Diagnosis not present

## 2019-03-09 DIAGNOSIS — G620 Drug-induced polyneuropathy: Secondary | ICD-10-CM

## 2019-03-09 DIAGNOSIS — T451X5A Adverse effect of antineoplastic and immunosuppressive drugs, initial encounter: Secondary | ICD-10-CM

## 2019-03-09 DIAGNOSIS — M79675 Pain in left toe(s): Secondary | ICD-10-CM

## 2019-03-09 DIAGNOSIS — B351 Tinea unguium: Secondary | ICD-10-CM

## 2019-03-10 ENCOUNTER — Encounter: Payer: Self-pay | Admitting: Podiatry

## 2019-03-10 NOTE — Progress Notes (Signed)
Subjective:  Patient ID: Katie [name], female    DOB: [birth year],  MRN: [id number]  Chief Complaint  Patient presents with  . Nail Problem    Bilateral 1st toenails lateral borders ingrown  . Nail Problem    Nail check bilateral 1st medial, pt states healing well    79 y.o. female presents with the above complaint.  Patient presents with bilateral lateral hallux ingrown toenails that has been causing her a lot of pain.  Patient states that it is pain on palpation as well as when ambulating it causes her a lot of pain.  She is not able to walk for very long period of time before it starts hurting her.  She denies any other acute complaints.  She has a secondary complaint of her thickened elongated painful mycotic toenails x10 that has been causing her a lot of pain while ambulating as well.  She would like to know if this could be debrided down.  She denies any other acute complaints.   Review of Systems: Negative except as noted in the HPI. Denies N/V/F/Ch.  Past Medical History:  Diagnosis Date  . Arthritis   . Cancer [facility])    Endometrial  . Complication of anesthesia    very hard to wake up from Anesthesia- then when wakes up feels like she is going to faint or have vomiting  . Family history of adverse reaction to anesthesia    daughter has a hard time waking up from Anesthesia  . GERD (gastroesophageal reflux disease)   . Glaucoma   . Hypothyroidism   . 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 PONV (postoperative nausea and vomiting)   . Thyroid disease     Current Outpatient Medications:  .  alendronate (FOSAMAX) 70 MG tablet, Take 1 tablet (70 mg total) by mouth once a week. Give with at least 8 ounces of plain water 30 min prior to breakfast.  Patient should be instructed to stay upright (not to lie down) for at least 30 minutes and until after first food of the day (to reduce esophageal irritation).  Wednesday, Disp: 12 tablet, Rfl: 1 .  b complex vitamins capsule, Take 1 capsule by mouth daily., Disp: 90 capsule, Rfl: 1 .  bimatoprost (LUMIGAN) 0.01 % SOLN, Place 1 drop into both eyes at bedtime. Can not take Generic, Disp: , Rfl:  .  brimonidine (ALPHAGAN P) 0.1 % SOLN, Place 1 drop into both eyes 2 (two) times daily. Can not take generic, Disp: , Rfl:  .  Calcium Citrate-Vitamin D (CALCIUM CITRATE + D PO), Take 1 tablet by mouth 2 (two) times daily. , Disp: , Rfl:  .  cholestyramine (QUESTRAN) 4 g packet, MIX AND DRINK 1 PACKET BY MOUTH TWICE DAILY (Patient taking differently: Take 4 g by mouth at bedtime. ), Disp: 180 each, Rfl: 1 .  esomeprazole (NEXIUM) 20 MG capsule, Take 20 mg by mouth daily. , Disp: , Rfl:  .  Iron, Ferrous Sulfate, 325 (65 Fe) MG TABS, Take 1 tablet by mouth every other day., Disp: 90 tablet, Rfl: 1 .  levothyroxine (SYNTHROID) 75 MCG tablet, TAKE 1 TABLET(75 MCG) BY MOUTH DAILY, Disp: 90 tablet, Rfl: 3 .  pyridOXINE (VITAMIN B-6) 100 MG tablet, Take 100 mg by mouth daily., Disp: , Rfl:   Social History   Tobacco Use  Smoking Status Never Smoker  Smokeless Tobacco Never Used    Allergies  Allergen Reactions  .  Sulfa Antibiotics Rash  . Gabapentin Other (See Comments)    unknown  . Lyrica [Pregabalin] Other (See Comments)    unknown   Objective:  There were no vitals filed for this visit. There is no height or weight on file to calculate BMI. Constitutional Well developed. Well nourished.  Vascular Dorsalis pedis pulses palpable bilaterally. Posterior tibial pulses palpable bilaterally. Capillary refill normal to all digits.  No cyanosis or clubbing noted. Pedal hair growth normal.  Neurologic Normal speech. Oriented to person, place, and time. Epicritic sensation to light touch grossly present bilaterally.  Dermatologic Painful ingrowing nail at lateral nail borders of the hallux nail bilaterally. No other open wounds. No skin lesions. Nail  Exam: Pt has thick disfigured discolored nails with subungual debris noted bilateral entire nail hallux through fifth toenails  Orthopedic: Normal joint ROM without pain or crepitus bilaterally. No visible deformities. No bony tenderness.   Radiographs: None Assessment:   1. Ingrown left big toenail   2. Ingrown toenail of right foot   3. Pain due to onychomycosis of toenails of both feet   4. Peripheral neuropathy due to chemotherapy [facility])    Plan:  Patient was evaluated and treated and all questions answered.  Ingrown Nail, bilaterally -Patient elects to proceed with minor surgery to remove ingrown toenail removal today. Consent reviewed and signed by patient. -Ingrown nail excised. See procedure note. -Educated on post-procedure care including soaking. Written instructions provided and reviewed. -Patient to follow up in 2 weeks for nail check.  Procedure: Excision of Ingrown Toenail Location: Bilateral 1st toe lateral nail borders. Anesthesia: Lidocaine 1% plain; 1.5 mL and Marcaine 0.5% plain; 1.5 mL, digital block. Skin Prep: Betadine. Dressing: Silvadene; telfa; dry, sterile, compression dressing. Technique: Following skin prep, the toe was exsanguinated and a tourniquet was secured at the base of the toe. The affected nail border was freed, split with a nail splitter, and excised. Chemical matrixectomy was then performed with phenol and irrigated out with alcohol. The tourniquet was then removed and sterile dressing applied. Disposition: Patient tolerated procedure well. Patient to return in 2 weeks for follow-up.   Onychomycosis with pain  -Nails palliatively debrided as below. -Educated on self-care  Procedure: Nail Debridement Rationale: pain  Type of Debridement: manual, sharp debridement. Instrumentation: Nail nipper, rotary burr. Number of Nails: 10  Procedures and Treatment: Consent by patient was obtained for treatment procedures. The patient understood the  discussion of treatment and procedures well. All questions were answered thoroughly reviewed. Debridement of mycotic and hypertrophic toenails, 1 through 5 bilateral and clearing of subungual debris. No ulceration, no infection noted.  Return Visit-Office Procedure: Patient instructed to return to the office for a follow up visit 3 months for continued evaluation and treatment.  Boneta Lucks, DPM    No follow-ups on file.   No follow-ups on file.

## 2019-03-17 ENCOUNTER — Ambulatory Visit: Payer: Medicare Other | Attending: Internal Medicine

## 2019-03-17 DIAGNOSIS — Z23 Encounter for immunization: Secondary | ICD-10-CM | POA: Insufficient documentation

## 2019-03-17 NOTE — Progress Notes (Signed)
   Covid-19 Vaccination Clinic  Name:  Katie [name]    MRN: [id number] DOB: [birth year]  03/17/2019  Katie [name] was observed post Covid-19 immunization for 15 minutes without incidence. She was provided with Vaccine Information Sheet and instruction to access the V-Safe system.   Katie [name] was instructed to call 911 with any severe reactions post vaccine: Marland Kitchen Difficulty breathing  . Swelling of your face and throat  . A fast heartbeat  . A bad rash all over your body  . Dizziness and weakness    Immunizations Administered    Name Date Dose VIS Date Route   Pfizer COVID-19 Vaccine 03/17/2019 12:45 PM 0.3 mL 01/16/2019 Intramuscular   Manufacturer: [place]   Lot: [id number]   [place]: [id number]

## 2019-03-19 ENCOUNTER — Ambulatory Visit: Payer: Medicare Other | Admitting: Family Medicine

## 2019-03-23 ENCOUNTER — Other Ambulatory Visit: Payer: Self-pay

## 2019-03-23 ENCOUNTER — Ambulatory Visit (INDEPENDENT_AMBULATORY_CARE_PROVIDER_SITE_OTHER): Payer: Medicare Other | Admitting: Neurology

## 2019-03-23 ENCOUNTER — Encounter: Payer: Self-pay | Admitting: Neurology

## 2019-03-23 VITALS — BP 180/74 | HR 131 | Ht 60.0 in | Wt 139.0 lb

## 2019-03-23 DIAGNOSIS — T451X5A Adverse effect of antineoplastic and immunosuppressive drugs, initial encounter: Secondary | ICD-10-CM | POA: Diagnosis not present

## 2019-03-23 DIAGNOSIS — G620 Drug-induced polyneuropathy: Secondary | ICD-10-CM

## 2019-03-23 NOTE — Progress Notes (Signed)
[facility] Clinic Note - Initial Visit   Date: 03/23/19  Katie [name] MRN: [id number] DOB: [birth year]   Dear Dr. Ethelene Hal:  Thank you for your kind referral of [name] for consultation of neuropathy. Although her history is well known to you, please allow [place] to reiterate it for the purpose of our medical record. The patient was accompanied to the clinic by self.   History of Present Illness: Katie [name] is a 79 y.o. right-handed female with osteoporosis, hypothyroidism, and history of endometrial cancer (10 years ago) presenting for evaluation of chemotherapy-induced neuropathy.   Patient developed chemotherapy-induced neuropathy more than 10 years ago after being treated for endometrial cancer.  Since this time, she developed numbness, stinging, tingling which involves her feet, lower legs, and fingertips.  She also has imbalance and has been using a cane for many years.  She has not suffered any falls over the past year. No gross weakness.  For her pain, she has tried gabapentin and Lyrica which caused cognitive side effects and no benefit, so stopped them.  She is otherwise highly independent.  She lives alone.  Her husband was transitioned into a memory care unit in late fall 2020.  She does not have any history of diabetes, heavy alcohol use, history of neuropathy.  Recent labs show vitamin B12 deficiency and she is taking oral supplementation.  Out-side paper records, electronic medical record, and images have been reviewed where available and summarized as:  No results found for: HGBA1C Lab Results  Component Value Date   VITAMINB12 143 (L) 02/20/2019   Lab Results  Component Value Date   TSH 2.09 11/10/2018   No results found for: ESRSEDRATE, POCTSEDRATE  Past Medical History:  Diagnosis Date  . Arthritis   . Cancer [facility])    Endometrial  . Complication of anesthesia    very hard to wake up from Anesthesia- then when wakes  up feels like she is going to faint or have vomiting  . Family history of adverse reaction to anesthesia    daughter has a hard time waking up from Anesthesia  . GERD (gastroesophageal reflux disease)   . Glaucoma   . Hypothyroidism   . 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 PONV (postoperative nausea and vomiting)   . Thyroid disease     Past Surgical History:  Procedure Laterality Date  . ABDOMINAL HYSTERECTOMY    . APPENDECTOMY    . BREAST EXCISIONAL BIOPSY Right    benign, was a cyst  . BREAST SURGERY    . CHOLECYSTECTOMY    . FEMUR SURGERY  06/2013  . 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 alendronate (FOSAMAX) 70 MG tablet Take 1 tablet (70 mg total) by mouth once a week. Give with at least 8 ounces of plain water 30 min prior to breakfast.  Patient should be instructed to stay upright (not to lie down) for at least 30 minutes and until after first food of the day (to reduce esophageal irritation). Wednesday  . b complex vitamins capsule Take 1 capsule by mouth daily.  . bimatoprost (LUMIGAN) 0.01 % SOLN Place 1 drop into both eyes at bedtime. Can not take Generic  . brimonidine (ALPHAGAN  P) 0.1 % SOLN Place 1 drop into both eyes 2 (two) times daily. Can not take generic  . Calcium Citrate-Vitamin D (CALCIUM CITRATE + D PO) Take 1 tablet by mouth 2 (two) times daily.   . cholestyramine (QUESTRAN) 4 g packet MIX AND DRINK 1 PACKET BY MOUTH TWICE DAILY (Patient taking differently: Take 4 g by mouth at bedtime. )  . esomeprazole (NEXIUM) 20 MG capsule Take 20 mg by mouth daily.   . Iron, Ferrous Sulfate, 325 (65 Fe) MG TABS Take 1 tablet by mouth every other day.  . levothyroxine (SYNTHROID)  75 MCG tablet TAKE 1 TABLET(75 MCG) BY MOUTH DAILY  . pyridOXINE (VITAMIN B-6) 100 MG tablet Take 100 mg by mouth daily.   No facility-administered encounter medications on file as of 03/23/2019.    Allergies:  Allergies  Allergen Reactions  . Sulfa Antibiotics Rash  . Gabapentin Other (See Comments)    unknown  . Lyrica [Pregabalin] Other (See Comments)    unknown    Family History: Family History  Problem Relation Age of Onset  . Heart disease Father   . Hearing loss Maternal Grandmother   . Hearing loss Maternal Grandfather   . Cancer Paternal Grandfather   . Cancer Sister     Social History: Social History   Tobacco Use  . Smoking status: Never Smoker  . Smokeless tobacco: Never Used  Substance Use Topics  . Alcohol use: No  . Drug use: No   Social History   Social History Narrative   Right handed   One story apartment   One cup coffee daily and tea    Vital Signs:  BP (!) 180/74   Pulse (!) 131   Ht 5' (1.524 m)   Wt 139 lb (63 kg)   SpO2 98%   BMI 27.15 kg/m    General Medical Exam:   General:  Well appearing, comfortable.   Eyes/ENT: see cranial nerve examination.     Extremities: Prominent edema of the lower legs bilaterally Skin:  No rashes or lesions.  Neurological Exam: MENTAL STATUS including orientation to time, place, person, recent and remote memory, attention span and concentration, language, and fund of knowledge is normal.  Speech is not dysarthric.  CRANIAL NERVES: II:  No visual field defects.   III-IV-VI: Pupils equal round and reactive to light.  Normal conjugate, extra-ocular eye movements in all directions of gaze.  No nystagmus.  No ptosis.   VIII:  Normal hearing and vestibular function.   XI:  Normal shoulder shrug and head rotation.    MOTOR:  No atrophy, fasciculations or abnormal movements.  No pronator drift.   Upper Extremity:  Right  Left  Deltoid  5/5   5/5   Biceps  5/5   5/5   Triceps  5/5   5/5     Infraspinatus 5/5  5/5  Medial pectoralis 5/5  5/5  Wrist extensors  5/5   5/5   Wrist flexors  5/5   5/5   Finger extensors  5/5   5/5   Finger flexors  5/5   5/5   Dorsal interossei  5/5   5/5   Abductor pollicis  5/5   5/5   Tone (Ashworth scale)  0  0   Lower Extremity:  Right  Left  Hip flexors  5/5   5/5   Hip extensors  5/5   5/5   Adductor 5/5  5/5  Abductor 5/5  5/5  Knee flexors  5/5   5/5   Knee extensors  5/5   5/5   Dorsiflexors  5/5   5/5   Plantarflexors  5/5   5/5   Toe extensors  5/5   5/5   Toe flexors  5-/5   5-/5   Tone (Ashworth scale)  0  0   MSRs:  Right        Left                  brachioradialis 2+  2+  biceps 2+  2+  triceps 2+  2+  patellar 1+  1+  ankle jerk 0  0  Hoffman no  no  plantar response down  down   SENSORY: Vibration is absent distal to ankles bilaterally.  Pinprick and temperature is preserved in the hands and feet.   COORDINATION/GAIT: Normal finger-to- nose-finger.  Gait appears mildly ataxic when unassisted.  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  I discussed with patient that given no improvement after the first year of chemotherapy, whatever neurological deficits are left will be lasting symptoms.  Unfortunately, there is no treatment available to reverse nerve injury and management is supportive.  Medications such as gabapentin and Lyrica only help with pain, which is not a major feature of her neuropathy, so I advised that she not take medication.  She has sensory ataxia for which physical therapy can be helpful.  She has already completed PT and has exercises at home.  No treatment is available for numbness.   Patient educated on daily foot inspection, fall prevention, and safety precautions around the home. Always use a cane when walking.  Take extra caution on uneven ground Always use a shower chair  I recommend that she take B12 supplementation, especially as this  can make neuropathy and sensory ataxia feel worse.  If her levels remain low on PO supplements, she may need to be switched to parental form,and will request her PCP to follow-up with this  All questions answered  Return to clinic as needed  Thank you for allowing me to participate in patient's care.  If I can answer any additional questions, I would be pleased to do so.    Sincerely,    Aujanae Mccullum K. Posey Pronto, DO

## 2019-03-23 NOTE — Patient Instructions (Signed)
Check feet daily Take extra caution on uneven ground Always cane

## 2019-04-06 ENCOUNTER — Telehealth: Payer: Self-pay | Admitting: Family Medicine

## 2019-04-06 NOTE — Telephone Encounter (Signed)
Patient calling states that she have recently started with leg pains and would like to know if she could take Biotin or magnesium for the cramps/pains that she has. Per pt she have had these issues for years just would like to know if there was something she could take for them. Please advise.

## 2019-04-06 NOTE — Telephone Encounter (Signed)
Patient is calling and stated that she is experiencing leg cramps and can she take biotin to help. Pls advise. CB is [phone]

## 2019-04-07 NOTE — Telephone Encounter (Signed)
Patient aware and will start on magnesium.

## 2019-04-07 NOTE — Telephone Encounter (Signed)
Magnesium seems to help.

## 2019-04-23 ENCOUNTER — Telehealth: Payer: Self-pay | Admitting: Family Medicine

## 2019-04-23 NOTE — Telephone Encounter (Signed)
Patient declined to do the AWV at this time.  She is having surgery.

## 2019-04-29 DIAGNOSIS — H25812 Combined forms of age-related cataract, left eye: Secondary | ICD-10-CM | POA: Diagnosis not present

## 2019-04-29 DIAGNOSIS — H2512 Age-related nuclear cataract, left eye: Secondary | ICD-10-CM | POA: Diagnosis not present

## 2019-04-29 HISTORY — PX: CATARACT EXTRACTION: SUR2

## 2019-05-15 ENCOUNTER — Encounter: Payer: Self-pay | Admitting: Podiatry

## 2019-05-15 ENCOUNTER — Other Ambulatory Visit: Payer: Self-pay

## 2019-05-15 ENCOUNTER — Ambulatory Visit (INDEPENDENT_AMBULATORY_CARE_PROVIDER_SITE_OTHER): Payer: Medicare Other | Admitting: Podiatry

## 2019-05-15 DIAGNOSIS — G620 Drug-induced polyneuropathy: Secondary | ICD-10-CM

## 2019-05-15 DIAGNOSIS — B351 Tinea unguium: Secondary | ICD-10-CM

## 2019-05-15 DIAGNOSIS — M79675 Pain in left toe(s): Secondary | ICD-10-CM | POA: Diagnosis not present

## 2019-05-15 DIAGNOSIS — M79674 Pain in right toe(s): Secondary | ICD-10-CM | POA: Diagnosis not present

## 2019-05-15 DIAGNOSIS — T451X5A Adverse effect of antineoplastic and immunosuppressive drugs, initial encounter: Secondary | ICD-10-CM

## 2019-05-19 ENCOUNTER — Encounter: Payer: Self-pay | Admitting: Podiatry

## 2019-05-19 NOTE — Progress Notes (Signed)
Subjective:  Patient ID: Katie [name], female    DOB: Dec 27, 1940,  MRN: [id number]  Chief Complaint  Patient presents with  . Callouses    i have some calluses that need to be trimmed   . Nail Problem    trim my nails    79 y.o. female presents with the above complaint.  Patient presents with bilateral lateral hallux ingrown which has been doing really well.  No pain associated with that.  However she has thickened elongated mycotic dystrophic toenails x10 that is causing her a lot of pain when ambulating.  She would like to have them debrided down.  She had eye surgery 2 weeks ago and she is currently taking eyedrops.  She denies any other acute complaints.  Review of Systems: Negative except as noted in the HPI. Denies N/V/F/Ch.  Past Medical History:  Diagnosis Date  . Arthritis   . Cancer [facility])    Endometrial  . Complication of anesthesia    very hard to wake up from Anesthesia- then when wakes up feels like she is going to faint or have vomiting  . Family history of adverse reaction to anesthesia    daughter has a hard time waking up from Anesthesia  . GERD (gastroesophageal reflux disease)   . Glaucoma   . Hypothyroidism   . 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 PONV (postoperative nausea and vomiting)   . Thyroid disease     Current Outpatient Medications:  .  alendronate (FOSAMAX) 70 MG tablet, Take 1 tablet (70 mg total) by mouth once a week. Give with at least 8 ounces of plain water 30 min prior to breakfast.  Patient should be instructed to stay upright (not to lie down) for at least 30 minutes and until after first food of the day (to reduce esophageal irritation). Wednesday, Disp: 12 tablet, Rfl: 1 .  b complex vitamins capsule, Take 1 capsule by mouth daily., Disp: 90 capsule, Rfl: 1 .  bimatoprost (LUMIGAN) 0.01 % SOLN, Place 1 drop into both eyes at bedtime. Can not take Generic, Disp: , Rfl:    .  brimonidine (ALPHAGAN P) 0.1 % SOLN, Place 1 drop into both eyes 2 (two) times daily. Can not take generic, Disp: , Rfl:  .  Calcium Citrate-Vitamin D (CALCIUM CITRATE + D PO), Take 1 tablet by mouth 2 (two) times daily. , Disp: , Rfl:  .  cholestyramine (QUESTRAN) 4 g packet, MIX AND DRINK 1 PACKET BY MOUTH TWICE DAILY (Patient taking differently: Take 4 g by mouth at bedtime. ), Disp: 180 each, Rfl: 1 .  esomeprazole (NEXIUM) 20 MG capsule, Take 20 mg by mouth daily. , Disp: , Rfl:  .  Iron, Ferrous Sulfate, 325 (65 Fe) MG TABS, Take 1 tablet by mouth every other day., Disp: 90 tablet, Rfl: 1 .  levothyroxine (SYNTHROID) 75 MCG tablet, TAKE 1 TABLET(75 MCG) BY MOUTH DAILY, Disp: 90 tablet, Rfl: 3 .  pyridOXINE (VITAMIN B-6) 100 MG tablet, Take 100 mg by mouth daily., Disp: , Rfl:   Social History   Tobacco Use  Smoking Status Never Smoker  Smokeless Tobacco Never Used    Allergies  Allergen Reactions  . Sulfa Antibiotics Rash  . Gabapentin Other (See Comments)    unknown  . Lyrica [Pregabalin] Other (See Comments)    unknown   Objective:  There were no vitals filed for this visit. There is no height or  weight on file to calculate BMI. Constitutional Well developed. Well nourished.  Vascular Dorsalis pedis pulses palpable bilaterally. Posterior tibial pulses palpable bilaterally. Capillary refill normal to all digits.  No cyanosis or clubbing noted. Pedal hair growth normal.  Neurologic Normal speech. Oriented to person, place, and time. Epicritic sensation to light touch grossly present bilaterally.  Dermatologic No other open wounds. No skin lesions. Nail Exam: Pt has thick disfigured discolored nails with subungual debris noted bilateral entire nail hallux through fifth toenails  Orthopedic: Normal joint ROM without pain or crepitus bilaterally. No visible deformities. No bony tenderness.   Radiographs: None Assessment:   No diagnosis found. Plan:  Patient was  evaluated and treated and all questions answered.  Ingrown Nail, lateral border bilaterally -Resolved  Onychomycosis with pain  -Nails palliatively debrided as below. -Educated on self-care  Procedure: Nail Debridement Rationale: pain  Type of Debridement: manual, sharp debridement. Instrumentation: Nail nipper, rotary burr. Number of Nails: 10  Procedures and Treatment: Consent by patient was obtained for treatment procedures. The patient understood the discussion of treatment and procedures well. All questions were answered thoroughly reviewed. Debridement of mycotic and hypertrophic toenails, 1 through 5 bilateral and clearing of subungual debris. No ulceration, no infection noted.  Return Visit-Office Procedure: Patient instructed to return to the office for a follow up visit 3 months for continued evaluation and treatment.  Boneta Lucks, DPM    No follow-ups on file.   No follow-ups on file.

## 2019-05-21 ENCOUNTER — Ambulatory Visit: Payer: Medicare Other | Admitting: Family Medicine

## 2019-05-28 ENCOUNTER — Other Ambulatory Visit: Payer: Self-pay

## 2019-05-29 ENCOUNTER — Encounter: Payer: Self-pay | Admitting: Family Medicine

## 2019-05-29 ENCOUNTER — Ambulatory Visit (INDEPENDENT_AMBULATORY_CARE_PROVIDER_SITE_OTHER): Payer: Medicare Other | Admitting: Family Medicine

## 2019-05-29 VITALS — BP 168/78 | HR 74 | Temp 96.8°F | Ht 60.0 in | Wt 139.6 lb

## 2019-05-29 DIAGNOSIS — E538 Deficiency of other specified B group vitamins: Secondary | ICD-10-CM

## 2019-05-29 DIAGNOSIS — F4329 Adjustment disorder with other symptoms: Secondary | ICD-10-CM | POA: Diagnosis not present

## 2019-05-29 DIAGNOSIS — E611 Iron deficiency: Secondary | ICD-10-CM | POA: Diagnosis not present

## 2019-05-29 DIAGNOSIS — R030 Elevated blood-pressure reading, without diagnosis of hypertension: Secondary | ICD-10-CM | POA: Diagnosis not present

## 2019-05-29 DIAGNOSIS — E559 Vitamin D deficiency, unspecified: Secondary | ICD-10-CM

## 2019-05-29 DIAGNOSIS — D649 Anemia, unspecified: Secondary | ICD-10-CM | POA: Diagnosis not present

## 2019-05-29 NOTE — Progress Notes (Signed)
Established Patient Office Visit  Subjective:  Patient ID: Katie [name], female    DOB: 21-Jun-1940  Age: 79 y.o. MRN: [id number]  CC:  Chief Complaint  Patient presents with  . Follow-up    3 month follow up, patient would like referral to have hearing checked.     HPI [name] presents for follow-up of her B12 and vitamin D deficiency.  Has been taking 2000 mg of B12 daily and a 325 mg iron sulfate every other day.  She is stressed.  Husband has been moved to the ICU and is in hospice care.  He has dysphagia, intermittently responsive and is refusing sustenance.  Her daughter from [place] has come home to say goodbye to him.  Patient has no history of hypertension.  Past Medical History:  Diagnosis Date  . Arthritis   . Cancer [facility])    Endometrial  . Complication of anesthesia    very hard to wake up from Anesthesia- then when wakes up feels like she is going to faint or have vomiting  . Family history of adverse reaction to anesthesia    daughter has a hard time waking up from Anesthesia  . GERD (gastroesophageal reflux disease)   . Glaucoma   . Hypothyroidism   . 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 PONV (postoperative nausea and vomiting)   . Thyroid disease     Past Surgical History:  Procedure Laterality Date  . ABDOMINAL HYSTERECTOMY    . APPENDECTOMY    . BREAST EXCISIONAL BIOPSY Right    benign, was a cyst  . BREAST SURGERY    . CHOLECYSTECTOMY    . FEMUR SURGERY  06/2013  . TOTAL KNEE ARTHROPLASTY Right 01/05/2019   Procedure: TOTAL KNEE ARTHROPLASTY;  Surgeon: Gaynelle Arabian, MD;  Location: [facility];  Service: Orthopedics;  Laterality: Right;  26min    Family History  Problem Relation Age of Onset  . Heart disease Father   . Hearing loss Maternal Grandmother   . Hearing loss Maternal Grandfather   . Cancer Paternal Grandfather   . Cancer Sister     Social History    Socioeconomic History  . Marital status: Married    Spouse name: Not on file  . Number of children: Not on file  . Years of education: Not on file  . Highest education level: Not on file  Occupational History  . Occupation: retired  Tobacco Use  . Smoking status: Never Smoker  . Smokeless tobacco: Never Used  Substance and Sexual Activity  . Alcohol use: No  . Drug use: No  . Sexual activity: Not on file  Other Topics Concern  . Not on file  Social History Narrative   Right handed   One story apartment   One cup coffee daily and tea   Social Determinants of Health   Financial Resource Strain:   . Difficulty of Paying Living Expenses:   Food Insecurity:   . Worried About Charity fundraiser in the Last Year:   . Arboriculturist in the Last Year:   Transportation Needs:   . Film/video editor (Medical):   Marland Kitchen Lack of Transportation (Non-Medical):   Physical Activity:   . Days of Exercise per Week:   . Minutes of Exercise per Session:   Stress:   . Feeling of Stress :   Social Connections:   . Frequency of Communication with Friends  and Family:   . Frequency of Social Gatherings with Friends and Family:   . Attends Religious Services:   . Active Member of Clubs or Organizations:   . Attends Archivist Meetings:   Marland Kitchen Marital Status:   Intimate Partner Violence:   . Fear of Current or Ex-Partner:   . Emotionally Abused:   Marland Kitchen Physically Abused:   . Sexually Abused:     Outpatient Medications Prior to Visit  Medication Sig Dispense Refill  . alendronate (FOSAMAX) 70 MG tablet Take 1 tablet (70 mg total) by mouth once a week. Give with at least 8 ounces of plain water 30 min prior to breakfast.  Patient should be instructed to stay upright (not to lie down) for at least 30 minutes and until after first food of the day (to reduce esophageal irritation). Wednesday 12 tablet 1  . b complex vitamins capsule Take 1 capsule by mouth daily. 90 capsule 1  .  bimatoprost (LUMIGAN) 0.01 % SOLN Place 1 drop into both eyes at bedtime. Can not take Generic    . brimonidine (ALPHAGAN P) 0.1 % SOLN Place 1 drop into both eyes 2 (two) times daily. Can not take generic    . Calcium Citrate-Vitamin D (CALCIUM CITRATE + D PO) Take 1 tablet by mouth 2 (two) times daily.     Marland Kitchen esomeprazole (NEXIUM) 20 MG capsule Take 20 mg by mouth daily.     . Iron, Ferrous Sulfate, 325 (65 Fe) MG TABS Take 1 tablet by mouth every other day. 90 tablet 1  . levothyroxine (SYNTHROID) 75 MCG tablet TAKE 1 TABLET(75 MCG) BY MOUTH DAILY 90 tablet 3  . cholestyramine (QUESTRAN) 4 g packet MIX AND DRINK 1 PACKET BY MOUTH TWICE DAILY (Patient not taking: No sig reported) 180 each 1  . pyridOXINE (VITAMIN B-6) 100 MG tablet Take 100 mg by mouth daily.     No facility-administered medications prior to visit.    Allergies  Allergen Reactions  . Sulfa Antibiotics Rash  . Gabapentin Other (See Comments)    unknown  . Lyrica [Pregabalin] Other (See Comments)    unknown    ROS Review of Systems  Constitutional: Negative.   HENT: Positive for hearing loss.   Eyes: Negative for photophobia and visual disturbance.  Respiratory: Negative.   Cardiovascular: Negative.   Gastrointestinal: Negative.   Endocrine: Negative for polyphagia and polyuria.  Genitourinary: Negative.   Musculoskeletal: Positive for gait problem.  Neurological: Positive for numbness.  Hematological: Does not bruise/bleed easily.  Psychiatric/Behavioral: Negative.       Objective:    Physical Exam  Constitutional: She is oriented to person, place, and time. She appears well-developed and well-nourished. No distress.  HENT:  Head: Normocephalic and atraumatic.  Right Ear: External ear normal.  Left Ear: External ear normal.  Eyes: Conjunctivae are normal. Right eye exhibits no discharge. Left eye exhibits no discharge. No scleral icterus.  Neck: No JVD present. No tracheal deviation present.    Cardiovascular: Normal rate, regular rhythm and normal heart sounds.  Pulmonary/Chest: Effort normal and breath sounds normal. No stridor.  Abdominal: Bowel sounds are normal.  Neurological: She is alert and oriented to person, place, and time.  Skin: Skin is warm and dry. She is not diaphoretic.  Psychiatric: She has a normal mood and affect. Her behavior is normal.    BP (!) 168/78   Pulse 74   Temp (!) 96.8 F (36 C) (Tympanic)   Ht 5' (1.524 m)  Wt 139 lb 9.6 oz (63.3 kg)   SpO2 97%   BMI 27.26 kg/m  Wt Readings from Last 3 Encounters:  05/29/19 139 lb 9.6 oz (63.3 kg)  03/23/19 139 lb (63 kg)  02/20/19 139 lb 12.8 oz (63.4 kg)     There are no preventive care reminders to display for this patient.  There are no preventive care reminders to display for this patient.  Lab Results  Component Value Date   TSH 2.09 11/10/2018   Lab Results  Component Value Date   WBC 7.7 02/20/2019   HGB 11.0 (L) 02/20/2019   HCT 34.8 (L) 02/20/2019   MCV 88.3 02/20/2019   PLT 434.0 (H) 02/20/2019   Lab Results  Component Value Date   NA 132 (L) 02/20/2019   K 4.0 02/20/2019   CO2 22 02/20/2019   GLUCOSE 89 02/20/2019   BUN 16 02/20/2019   CREATININE 0.77 02/20/2019   BILITOT 0.6 12/29/2018   ALKPHOS 38 12/29/2018   AST 25 12/29/2018   ALT 22 12/29/2018   PROT 6.9 12/29/2018   ALBUMIN 3.8 12/29/2018   CALCIUM 9.6 02/20/2019   ANIONGAP 7 01/06/2019   GFR 72.07 02/20/2019   No results found for: CHOL No results found for: HDL No results found for: LDLCALC No results found for: TRIG No results found for: CHOLHDL No results found for: HGBA1C    Assessment & Plan:   Problem List Items Addressed This Visit      Other   Vitamin D deficiency   Relevant Orders   VITAMIN D 25 Hydroxy (Vit-D Deficiency, Fractures)   Anemia   Relevant Orders   CBC   Iron deficiency   Relevant Orders   Iron, TIBC and Ferritin Panel   B12 deficiency - Primary   Relevant Orders    Vitamin B12   Elevated BP without diagnosis of hypertension   Stress and adjustment reaction      No orders of the defined types were placed in this encounter.   Follow-up: Return in about 2 weeks (around 06/12/2019).   Recheck blood pressure in 2 weeks. Libby Maw, MD

## 2019-05-30 LAB — CBC
HCT: 39.7 % (ref 35.0–45.0)
Hemoglobin: 12.8 g/dL (ref 11.7–15.5)
MCH: 28.6 pg (ref 27.0–33.0)
MCHC: 32.2 g/dL (ref 32.0–36.0)
MCV: 88.6 fL (ref 80.0–100.0)
MPV: 10.4 fL (ref 7.5–12.5)
Platelets: 320 10*3/uL (ref 140–400)
RBC: 4.48 10*6/uL (ref 3.80–5.10)
RDW: 14.5 % (ref 11.0–15.0)
WBC: 6.3 10*3/uL (ref 3.8–10.8)

## 2019-05-30 LAB — IRON,TIBC AND FERRITIN PANEL
%SAT: 23 % (calc) (ref 16–45)
Ferritin: 40 ng/mL (ref 16–288)
Iron: 103 ug/dL (ref 45–160)
TIBC: 441 mcg/dL (calc) (ref 250–450)

## 2019-05-30 LAB — VITAMIN B12: Vitamin B-12: 562 pg/mL (ref 200–1100)

## 2019-05-30 LAB — VITAMIN D 25 HYDROXY (VIT D DEFICIENCY, FRACTURES): Vit D, 25-Hydroxy: 25 ng/mL — ABNORMAL LOW (ref 30–100)

## 2019-06-08 ENCOUNTER — Telehealth: Payer: Self-pay

## 2019-06-08 NOTE — Telephone Encounter (Signed)
Patient calling stating that Dr. Ihor Dow orders her yearly lung xray due to history of cancer.   Patient states that she is having some wheezing and wanting to know if she can have her yearly xray ordered sooner (usually done in Nov). I encouraged her to seek care with her PCP since she is symptomatic and patient states she has an appointment with Dr. Ethelene Hal on Monday. Encouraged to go to this appointment and will pass along the message to Dr. Ihor Dow.  I let patient know it would most likely be too earlier since she just had the yearly xray  6 months ago. Patient states understanding. Kathrene Alu RN

## 2019-06-12 ENCOUNTER — Telehealth: Payer: Self-pay | Admitting: Podiatry

## 2019-06-12 NOTE — Telephone Encounter (Signed)
Guarantor number is [phone] and I have a balance of $50. I don't know if you billed my secondary insurance. Please call me back. Thank you.

## 2019-06-15 ENCOUNTER — Ambulatory Visit (INDEPENDENT_AMBULATORY_CARE_PROVIDER_SITE_OTHER): Payer: Medicare Other | Admitting: Family Medicine

## 2019-06-15 ENCOUNTER — Other Ambulatory Visit: Payer: Self-pay

## 2019-06-15 ENCOUNTER — Encounter: Payer: Self-pay | Admitting: Family Medicine

## 2019-06-15 VITALS — BP 148/76 | HR 88 | Temp 98.4°F | Ht 60.0 in | Wt 140.4 lb

## 2019-06-15 DIAGNOSIS — E559 Vitamin D deficiency, unspecified: Secondary | ICD-10-CM | POA: Diagnosis not present

## 2019-06-15 DIAGNOSIS — J4521 Mild intermittent asthma with (acute) exacerbation: Secondary | ICD-10-CM

## 2019-06-15 DIAGNOSIS — J45909 Unspecified asthma, uncomplicated: Secondary | ICD-10-CM | POA: Insufficient documentation

## 2019-06-15 DIAGNOSIS — I10 Essential (primary) hypertension: Secondary | ICD-10-CM

## 2019-06-15 DIAGNOSIS — R252 Cramp and spasm: Secondary | ICD-10-CM

## 2019-06-15 DIAGNOSIS — F4329 Adjustment disorder with other symptoms: Secondary | ICD-10-CM | POA: Diagnosis not present

## 2019-06-15 LAB — BASIC METABOLIC PANEL
BUN: 17 mg/dL (ref 6–23)
CO2: 25 mEq/L (ref 19–32)
Calcium: 9.2 mg/dL (ref 8.4–10.5)
Chloride: 100 mEq/L (ref 96–112)
Creatinine, Ser: 0.83 mg/dL (ref 0.40–1.20)
GFR: 66.04 mL/min (ref >60.00)
Glucose, Bld: 142 mg/dL — ABNORMAL HIGH (ref 70–99)
Potassium: 3.6 mEq/L (ref 3.5–5.1)
Sodium: 132 mEq/L — ABNORMAL LOW (ref 135–145)

## 2019-06-15 LAB — MAGNESIUM: Magnesium: 1.9 mg/dL (ref 1.5–2.5)

## 2019-06-15 MED ORDER — CHLORTHALIDONE 25 MG PO TABS: 25.0000 mg | ORAL_TABLET | Freq: Every day | ORAL | 1 refills | Status: DC

## 2019-06-15 MED ORDER — PREDNISONE 10 MG PO TABS: 10.0000 mg | ORAL_TABLET | Freq: Two times a day (BID) | ORAL | 0 refills | Status: AC

## 2019-06-15 MED ORDER — PAROXETINE HCL 10 MG PO TABS: 10.0000 mg | ORAL_TABLET | Freq: Every day | ORAL | 2 refills | Status: DC

## 2019-06-15 MED ORDER — ALBUTEROL SULFATE HFA 108 (90 BASE) MCG/ACT IN AERS: 1.0000 | INHALATION_SPRAY | Freq: Four times a day (QID) | RESPIRATORY_TRACT | 0 refills | Status: DC | PRN

## 2019-06-15 NOTE — Progress Notes (Signed)
Established Patient Office Visit  Subjective:  Patient ID: Katie [name], female    DOB: 1940/07/22  Age: 79 y.o. MRN: [id number]  CC:  Chief Complaint  Patient presents with   Follow-up    2 week follow up. c/o swollen ankles, leg cramps and patient would like chest x-ray due to cough and wheezing.     HPI [name] presents for follow-up of her vitamin D deficiency, hypertension, swelling in her lower extremities, stress poor sleep and wheezing.  Patient has started vitamin D3 3000 units daily for vitamin D deficiency.  Husband died on the 27-May-2022 of last month.  His death of her anxiety to a head.  She is busy planning a funeral and putting his affairs in order.  They were married for 58 years and 9 months.  She has been wheezing over the last week or so.  Pollen season has bothered her a great deal.  There is been no fever chills or sputum production she is having postnasal drip and some sneezing.  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   PONV (postoperative nausea and vomiting)    Thyroid disease     Past Surgical History:  Procedure Laterality Date   ABDOMINAL HYSTERECTOMY     APPENDECTOMY     BREAST EXCISIONAL BIOPSY Right    benign, was a cyst   BREAST SURGERY     CHOLECYSTECTOMY     FEMUR SURGERY  06/2013   TOTAL KNEE ARTHROPLASTY Right 01/05/2019   Procedure: TOTAL KNEE ARTHROPLASTY;  Surgeon: Gaynelle Arabian, MD;  Location: [facility];  Service: Orthopedics;  Laterality: Right;  58min     Family History  Problem Relation Age of Onset   Heart disease Father    Hearing loss Maternal Grandmother    Hearing loss Maternal Grandfather    Cancer Paternal Grandfather    Cancer Sister     Social History   Socioeconomic History   Marital status: Married    Spouse name: Not on file   Number of children: Not on file   Years of education: Not on file   Highest education level: Not on file  Occupational History   Occupation: retired  Tobacco Use   Smoking status: Never Smoker   Smokeless tobacco: Never Used  Substance and Sexual Activity   Alcohol use: No   Drug use: No   Sexual activity: Not on file  Other Topics Concern   Not on file  Social History Narrative   Right handed   One story apartment   One cup coffee daily and tea   Social Determinants of Health   Financial Resource Strain:    Difficulty of Paying Living Expenses:   Food Insecurity:    Worried About Charity fundraiser in the Last Year:    Arboriculturist in the Last Year:   Transportation Needs:    Film/video editor (Medical):    Lack of Transportation (  Non-Medical):   Physical Activity:    Days of Exercise per Week:    Minutes of Exercise per Session:   Stress:    Feeling of Stress :   Social Connections:    Frequency of Communication with Friends and Family:    Frequency of Social Gatherings with Friends and Family:    Attends Religious Services:    Active Member of Clubs or Organizations:    Attends Music therapist:    Marital Status:   Intimate Partner Violence:    Fear of Current or Ex-Partner:    Emotionally Abused:    Physically Abused:    Sexually Abused:     Outpatient Medications Prior to Visit  Medication Sig Dispense Refill   alendronate (FOSAMAX) 70 MG tablet Take 1 tablet (70 mg total) by mouth once a week. Give with at least 8 ounces of plain water 30 min prior to breakfast.  Patient should be instructed to stay  upright (not to lie down) for at least 30 minutes and until after first food of the day (to reduce esophageal irritation). Wednesday 12 tablet 1   b complex vitamins capsule Take 1 capsule by mouth daily. 90 capsule 1   bimatoprost (LUMIGAN) 0.01 % SOLN Place 1 drop into both eyes at bedtime. Can not take Generic     brimonidine (ALPHAGAN P) 0.1 % SOLN Place 1 drop into both eyes 2 (two) times daily. Can not take generic     Calcium Citrate-Vitamin D (CALCIUM CITRATE + D PO) Take 1 tablet by mouth 2 (two) times daily.      cholecalciferol (VITAMIN D3) 25 MCG (1000 UNIT) tablet Take 3,000 Units by mouth daily.     cholestyramine (QUESTRAN) 4 g packet MIX AND DRINK 1 PACKET BY MOUTH TWICE DAILY 180 each 1   esomeprazole (NEXIUM) 20 MG capsule Take 20 mg by mouth daily.      Iron, Ferrous Sulfate, 325 (65 Fe) MG TABS Take 1 tablet by mouth every other day. 90 tablet 1   levothyroxine (SYNTHROID) 75 MCG tablet TAKE 1 TABLET(75 MCG) BY MOUTH DAILY 90 tablet 3   pyridOXINE (VITAMIN B-6) 100 MG tablet Take 100 mg by mouth daily.     No facility-administered medications prior to visit.    Allergies  Allergen Reactions   Sulfa Antibiotics Rash   Gabapentin Other (See Comments)    unknown   Lyrica [Pregabalin] Other (See Comments)    unknown    ROS Review of Systems  Constitutional: Negative for diaphoresis, fatigue, fever and unexpected weight change.  HENT: Positive for congestion, postnasal drip and sneezing.   Eyes: Negative for photophobia and visual disturbance.  Respiratory: Positive for cough and wheezing. Negative for shortness of breath.   Cardiovascular: Negative.   Gastrointestinal: Negative.   Genitourinary: Negative.   Musculoskeletal: Negative for gait problem and joint swelling.  Skin: Negative for pallor and rash.  Allergic/Immunologic: Negative for immunocompromised state.  Neurological: Negative for speech difficulty and light-headedness.  Hematological:  Does not bruise/bleed easily.  Psychiatric/Behavioral: The patient is nervous/anxious.       Objective:    Physical Exam  Constitutional: She is oriented to person, place, and time. She appears well-developed and well-nourished. No distress.  HENT:  Head: Normocephalic and atraumatic.  Right Ear: External ear normal.  Left Ear: External ear normal.  Eyes: Conjunctivae are normal. Right eye exhibits no discharge. Left eye exhibits no discharge. No scleral icterus.  Neck: No JVD present. No tracheal deviation  present.  Cardiovascular: Normal rate and normal heart sounds.  Pulmonary/Chest: Effort normal and breath sounds normal. No stridor. No respiratory distress. She has no wheezes. She has no rales.  Abdominal: Bowel sounds are normal.  Musculoskeletal:        General: No edema.  Neurological: She is alert and oriented to person, place, and time.  Skin: Skin is warm and dry. She is not diaphoretic.  Psychiatric: She has a normal mood and affect. Her behavior is normal.    BP (!) 148/76    Pulse 88    Temp 98.4 F (36.9 C) (Tympanic)    Ht 5' (1.524 m)    Wt 140 lb 6.4 oz (63.7 kg)    SpO2 96%    BMI 27.42 kg/m  Wt Readings from Last 3 Encounters:  06/15/19 140 lb 6.4 oz (63.7 kg)  05/29/19 139 lb 9.6 oz (63.3 kg)  03/23/19 139 lb (63 kg)     There are no preventive care reminders to display for this patient.  There are no preventive care reminders to display for this patient.  Lab Results  Component Value Date   TSH 2.09 11/10/2018   Lab Results  Component Value Date   WBC 6.3 05/29/2019   HGB 12.8 05/29/2019   HCT 39.7 05/29/2019   MCV 88.6 05/29/2019   PLT 320 05/29/2019   Lab Results  Component Value Date   NA 132 (L) 02/20/2019   K 4.0 02/20/2019   CO2 22 02/20/2019   GLUCOSE 89 02/20/2019   BUN 16 02/20/2019   CREATININE 0.77 02/20/2019   BILITOT 0.6 12/29/2018   ALKPHOS 38 12/29/2018   AST 25 12/29/2018   ALT 22 12/29/2018   PROT 6.9 12/29/2018    ALBUMIN 3.8 12/29/2018   CALCIUM 9.6 02/20/2019   ANIONGAP 7 01/06/2019   GFR 72.07 02/20/2019   No results found for: CHOL No results found for: HDL No results found for: LDLCALC No results found for: TRIG No results found for: CHOLHDL No results found for: HGBA1C    Assessment & Plan:   Problem List Items Addressed This Visit      Cardiovascular and Mediastinum   Essential hypertension   Relevant Medications   chlorthalidone (HYGROTON) 25 MG tablet   Other Relevant Orders   Basic metabolic panel     Respiratory   Reactive airway disease   Relevant Medications   predniSONE (DELTASONE) 10 MG tablet   albuterol (VENTOLIN HFA) 108 (90 Base) MCG/ACT inhaler     Other   Vitamin D deficiency - Primary   Stress and adjustment reaction   Relevant Medications   PARoxetine (PAXIL) 10 MG tablet   Muscle cramps   Relevant Orders   Magnesium      Meds ordered this encounter  Medications   PARoxetine (PAXIL) 10 MG tablet    Sig: Take 1 tablet (10 mg total) by mouth daily.    Dispense:  30 tablet    Refill:  2   chlorthalidone (HYGROTON) 25 MG tablet    Sig: Take 1 tablet (25 mg total) by mouth daily.    Dispense:  90 tablet    Refill:  1   predniSONE (DELTASONE) 10 MG tablet    Sig: Take 1 tablet (10 mg total) by mouth 2 (two) times daily with a meal for 5 days.    Dispense:  10 tablet    Refill:  0   albuterol (VENTOLIN HFA) 108 (90 Base) MCG/ACT inhaler  Sig: Inhale 1-2 puffs into the lungs every 6 (six) hours as needed for wheezing or shortness of breath.    Dispense:  18 g    Refill:  0    Follow-up: Return in about 2 months (around 08/15/2019).  She will try low-dose Paxil for her stress anxiety and poor sleep.  Short course of low-dose prednisone.  We will try magnesium sulfate for her cramps.  Libby Maw, MD

## 2019-06-23 ENCOUNTER — Telehealth: Payer: Self-pay | Admitting: Family Medicine

## 2019-06-23 NOTE — Telephone Encounter (Signed)
Patient is calling and requesting a referral be sent to [facility] for a hearing text. Pls fax referall to [phone].

## 2019-06-24 ENCOUNTER — Telehealth: Payer: Self-pay | Admitting: Family Medicine

## 2019-06-24 NOTE — Telephone Encounter (Signed)
Returned patients call, no answer LMTCB 

## 2019-06-24 NOTE — Telephone Encounter (Signed)
Patient is calling and stated that she is experiencing crackling in her lungs and a headache. Patient is requesting a call back. CB is [phone]

## 2019-06-25 NOTE — Telephone Encounter (Signed)
Called patient to check an see how she was feeling also wanted to inform her that referral form was sent over for hearing test. No answer LMTCB

## 2019-06-25 NOTE — Telephone Encounter (Signed)
Referral faxed patient aware

## 2019-06-25 NOTE — Telephone Encounter (Signed)
Spoke with patient who states that she have stopped taking Paxil due to the different side effects she was having with headaches. She would like to know if there was anything else she can take to help with swelling that will not affect her BP. Please advise.

## 2019-06-26 NOTE — Telephone Encounter (Signed)
Compression stockings.  

## 2019-06-26 NOTE — Telephone Encounter (Signed)
Patient aware and will try stocking but states she will not be consistent with them

## 2019-06-27 ENCOUNTER — Observation Stay (HOSPITAL_COMMUNITY)
Admission: EM | Admit: 2019-06-27 | Discharge: 2019-06-29 | Disposition: A | Discharge status: Home Health | Payer: Medicare Other | Attending: Family Medicine | Admitting: Family Medicine

## 2019-06-27 ENCOUNTER — Encounter (HOSPITAL_COMMUNITY): Payer: Self-pay

## 2019-06-27 ENCOUNTER — Other Ambulatory Visit: Payer: Self-pay

## 2019-06-27 ENCOUNTER — Emergency Department (HOSPITAL_COMMUNITY): Payer: Medicare Other

## 2019-06-27 DIAGNOSIS — D649 Anemia, unspecified: Secondary | ICD-10-CM | POA: Insufficient documentation

## 2019-06-27 DIAGNOSIS — R4020 Unspecified coma: Secondary | ICD-10-CM | POA: Diagnosis not present

## 2019-06-27 DIAGNOSIS — Z8249 Family history of ischemic heart disease and other diseases of the circulatory system: Secondary | ICD-10-CM | POA: Diagnosis not present

## 2019-06-27 DIAGNOSIS — Z8542 Personal history of malignant neoplasm of other parts of uterus: Secondary | ICD-10-CM | POA: Insufficient documentation

## 2019-06-27 DIAGNOSIS — Z20822 Contact with and (suspected) exposure to covid-19: Secondary | ICD-10-CM | POA: Insufficient documentation

## 2019-06-27 DIAGNOSIS — K219 Gastro-esophageal reflux disease without esophagitis: Secondary | ICD-10-CM | POA: Insufficient documentation

## 2019-06-27 DIAGNOSIS — I440 Atrioventricular block, first degree: Secondary | ICD-10-CM | POA: Insufficient documentation

## 2019-06-27 DIAGNOSIS — Z888 Allergy status to other drugs, medicaments and biological substances status: Secondary | ICD-10-CM | POA: Diagnosis not present

## 2019-06-27 DIAGNOSIS — M810 Age-related osteoporosis without current pathological fracture: Secondary | ICD-10-CM | POA: Diagnosis not present

## 2019-06-27 DIAGNOSIS — E871 Hypo-osmolality and hyponatremia: Secondary | ICD-10-CM | POA: Diagnosis not present

## 2019-06-27 DIAGNOSIS — Z79899 Other long term (current) drug therapy: Secondary | ICD-10-CM | POA: Insufficient documentation

## 2019-06-27 DIAGNOSIS — E039 Hypothyroidism, unspecified: Secondary | ICD-10-CM | POA: Diagnosis not present

## 2019-06-27 DIAGNOSIS — E538 Deficiency of other specified B group vitamins: Secondary | ICD-10-CM | POA: Insufficient documentation

## 2019-06-27 DIAGNOSIS — H409 Unspecified glaucoma: Secondary | ICD-10-CM | POA: Insufficient documentation

## 2019-06-27 DIAGNOSIS — Z882 Allergy status to sulfonamides status: Secondary | ICD-10-CM | POA: Diagnosis not present

## 2019-06-27 DIAGNOSIS — Z66 Do not resuscitate: Secondary | ICD-10-CM | POA: Insufficient documentation

## 2019-06-27 DIAGNOSIS — G629 Polyneuropathy, unspecified: Secondary | ICD-10-CM | POA: Insufficient documentation

## 2019-06-27 DIAGNOSIS — Z79890 Hormone replacement therapy (postmenopausal): Secondary | ICD-10-CM | POA: Insufficient documentation

## 2019-06-27 DIAGNOSIS — R531 Weakness: Secondary | ICD-10-CM | POA: Diagnosis not present

## 2019-06-27 DIAGNOSIS — R42 Dizziness and giddiness: Secondary | ICD-10-CM | POA: Diagnosis not present

## 2019-06-27 DIAGNOSIS — Z7983 Long term (current) use of bisphosphonates: Secondary | ICD-10-CM | POA: Diagnosis not present

## 2019-06-27 DIAGNOSIS — E559 Vitamin D deficiency, unspecified: Secondary | ICD-10-CM | POA: Insufficient documentation

## 2019-06-27 DIAGNOSIS — I10 Essential (primary) hypertension: Secondary | ICD-10-CM | POA: Insufficient documentation

## 2019-06-27 DIAGNOSIS — I959 Hypotension, unspecified: Secondary | ICD-10-CM | POA: Diagnosis not present

## 2019-06-27 DIAGNOSIS — R55 Syncope and collapse: Secondary | ICD-10-CM | POA: Diagnosis not present

## 2019-06-27 DIAGNOSIS — Z96651 Presence of right artificial knee joint: Secondary | ICD-10-CM | POA: Diagnosis not present

## 2019-06-27 LAB — I-STAT CHEM 8, ED
BUN: 25 mg/dL — ABNORMAL HIGH (ref 8–23)
Calcium, Ion: 1.11 mmol/L — ABNORMAL LOW (ref 1.15–1.40)
Chloride: 91 mmol/L — ABNORMAL LOW (ref 98–111)
Creatinine, Ser: 0.8 mg/dL (ref 0.44–1.00)
Glucose, Bld: 113 mg/dL — ABNORMAL HIGH (ref 70–99)
HCT: 42 % (ref 36.0–46.0)
Hemoglobin: 14.3 g/dL (ref 12.0–15.0)
Potassium: 4.2 mmol/L (ref 3.5–5.1)
Sodium: 126 mmol/L — ABNORMAL LOW (ref 135–145)
TCO2: 30 mmol/L (ref 22–32)

## 2019-06-27 LAB — COMPREHENSIVE METABOLIC PANEL
ALT: 10 U/L (ref 0–44)
AST: 33 U/L (ref 15–41)
Albumin: 3.5 g/dL (ref 3.5–5.0)
Alkaline Phosphatase: 42 U/L (ref 38–126)
Anion gap: 11 (ref 5–15)
BUN: 19 mg/dL (ref 8–23)
CO2: 26 mmol/L (ref 22–32)
Calcium: 9 mg/dL (ref 8.9–10.3)
Chloride: 90 mmol/L — ABNORMAL LOW (ref 98–111)
Creatinine, Ser: 0.86 mg/dL (ref 0.44–1.00)
GFR calc Af Amer: >60 mL/min (ref >60)
GFR calc non Af Amer: >60 mL/min (ref >60)
Glucose, Bld: 116 mg/dL — ABNORMAL HIGH (ref 70–99)
Potassium: 4.6 mmol/L (ref 3.5–5.1)
Sodium: 127 mmol/L — ABNORMAL LOW (ref 135–145)
Total Bilirubin: 0.8 mg/dL (ref 0.3–1.2)
Total Protein: 6.4 g/dL — ABNORMAL LOW (ref 6.5–8.1)

## 2019-06-27 LAB — CBC WITH DIFFERENTIAL/PLATELET
Abs Immature Granulocytes: 0.06 10*3/uL (ref 0.00–0.07)
Basophils Absolute: 0 10*3/uL (ref 0.0–0.1)
Basophils Relative: 0 %
Eosinophils Absolute: 0.1 10*3/uL (ref 0.0–0.5)
Eosinophils Relative: 1 %
HCT: 40.3 % (ref 36.0–46.0)
Hemoglobin: 13.2 g/dL (ref 12.0–15.0)
Immature Granulocytes: 1 %
Lymphocytes Relative: 10 %
Lymphs Abs: 1.1 10*3/uL (ref 0.7–4.0)
MCH: 29.3 pg (ref 26.0–34.0)
MCHC: 32.8 g/dL (ref 30.0–36.0)
MCV: 89.4 fL (ref 80.0–100.0)
Monocytes Absolute: 0.9 10*3/uL (ref 0.1–1.0)
Monocytes Relative: 8 %
Neutro Abs: 9 10*3/uL — ABNORMAL HIGH (ref 1.7–7.7)
Neutrophils Relative %: 80 %
Platelets: 429 10*3/uL — ABNORMAL HIGH (ref 150–400)
RBC: 4.51 MIL/uL (ref 3.87–5.11)
RDW: 13.1 % (ref 11.5–15.5)
WBC: 11.2 10*3/uL — ABNORMAL HIGH (ref 4.0–10.5)
nRBC: 0 % (ref 0.0–0.2)

## 2019-06-27 LAB — MAGNESIUM: Magnesium: 2.1 mg/dL (ref 1.7–2.4)

## 2019-06-27 LAB — TROPONIN I (HIGH SENSITIVITY)
Troponin I (High Sensitivity): 17 ng/L (ref <18)
Troponin I (High Sensitivity): 17 ng/L (ref <18)

## 2019-06-27 MED ORDER — LACTATED RINGERS IV BOLUS
500.0000 mL | Freq: Once | INTRAVENOUS | Status: AC
  Administered 2019-06-27: 500 mL via INTRAVENOUS

## 2019-06-27 NOTE — ED Provider Notes (Signed)
[place] EMERGENCY DEPARTMENT Provider Note   CSN: [id number] Arrival date & time: 06/27/19  1840     History Chief Complaint  Patient presents with  . Loss of Consciousness    Katie [name] is a 79 y.o. female.  HPI Patient is an 79 year old female with no history of syncope who presents after syncopal episode.  Her husband died a few weeks ago.  Other than that, she has felt fine recently.  She got a good night sleep last night.  She woke up feeling normal.  She had a normal breakfast.  She had a 2-hour nap.  She was able to go to the store and run errands.  She skipped lunch.  In the afternoon, she reported feeling unwell.  She thought her blood sugar was low.  She ate a doughnut and drank some juice.  A neighbor came over to visit her.  While visiting with a neighbor, she was seated in a chair and syncopized.  She denies any prodrome, or any chest pains or palpitations.  Her neighbor was able to ease her to the floor.  She did not sustain any injuries during the episode.  She is unaware of how long she was unconscious, but does not believe that it was very long.  After she came to, she felt clammy.  EMS arrived on scene and noted an initial blood pressure of 72/58.  During transit, she was given 250 cc of IVF.  Subsequent blood pressure was 112/56.  She was recently started on chlorthalidone, but she stopped taking that Wednesday (3 days ago) due to her blood pressure getting low and her not feeling well while taking it.  She had a short course of prednisone and Paxil, but also stopped taking these several days ago.  She has not had any other recent medication changes.  In the ED, she reports feeling back to normal.  She denies any current symptoms.    Past Medical History:  Diagnosis Date  . Arthritis   . Cancer [facility])    Endometrial  . Complication of anesthesia    very hard to wake up from Anesthesia- then when wakes up feels like she is going to faint or have  vomiting  . Family history of adverse reaction to anesthesia    daughter has a hard time waking up from Anesthesia  . GERD (gastroesophageal reflux disease)   . Glaucoma   . Hypothyroidism   . 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 PONV (postoperative nausea and vomiting)   . Thyroid disease     Patient Active Problem List   Diagnosis Date Noted  . Syncope 06/27/2019  . Reactive airway disease 06/15/2019  . Muscle cramps 06/15/2019  . Essential hypertension 06/15/2019  . Elevated BP without diagnosis of hypertension 05/29/2019  . Stress and adjustment reaction 05/29/2019  . Iron deficiency 02/23/2019  . B12 deficiency 02/23/2019  . Hospital discharge follow-up 02/20/2019  . Anemia 02/20/2019  . 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 Vitamin D deficiency 09/15/2018  . Pain due to onychomycosis of toenails of both feet 08/05/2018  . Osteoporosis without current pathological fracture 03/17/2018  . Healthcare maintenance 03/17/2018  . Elevated LDL cholesterol level 03/17/2018  . History of total hip arthroplasty, left 01/23/2018  . Cellulitis of right upper extremity 09/26/2017  . Left flank pain 08/26/2017  . OA (osteoarthritis) of knee 06/03/2017  . Loose stools 05/30/2017  . Chronic  diarrhea 05/30/2017  . 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 Osteoarthritis of right knee 05/27/2017  . Labyrinthitis 04/18/2017  . Need for assistance due to unsteady gait 04/18/2017  . Age-related osteoporosis without current pathological fracture 01/23/2017  . Acquired hypothyroidism 01/23/2017  . History of endometrial cancer 01/23/2017    Past Surgical History:  Procedure Laterality Date  . ABDOMINAL HYSTERECTOMY    . APPENDECTOMY    . BREAST EXCISIONAL BIOPSY Right    benign, was a cyst  . BREAST SURGERY    . CHOLECYSTECTOMY    . FEMUR SURGERY  06/2013  . TOTAL KNEE ARTHROPLASTY Right 01/05/2019    Procedure: TOTAL KNEE ARTHROPLASTY;  Surgeon: Gaynelle Arabian, MD;  Location: [facility] ORS;  Service: Orthopedics;  Laterality: Right;  72min     OB History    Gravida  2   Para  2   Term  2   Preterm      AB      Living  2     SAB      TAB      Ectopic      Multiple      Live Births  2           Family History  Problem Relation Age of Onset  . Heart disease Father   . Hearing loss Maternal Grandmother   . Hearing loss Maternal Grandfather   . Cancer Paternal Grandfather   . Cancer Sister     Social History   Tobacco Use  . Smoking status: Never Smoker  . Smokeless tobacco: Never Used  Substance Use Topics  . Alcohol use: No  . Drug use: No    Home Medications Prior to Admission medications   Medication Sig Start Date End Date Taking? Authorizing Provider  albuterol (VENTOLIN HFA) 108 (90 Base) MCG/ACT inhaler Inhale 1-2 puffs into the lungs every 6 (six) hours as needed for wheezing or shortness of breath. 06/15/19  Yes Libby Maw, MD  alendronate (FOSAMAX) 70 MG tablet Take 1 tablet (70 mg total) by mouth once a week. Give with at least 8 ounces of plain water 30 min prior to breakfast.  Patient should be instructed to stay upright (not to lie down) for at least 30 minutes and until after first food of the day (to reduce esophageal irritation). Wednesday 02/20/19  Yes Libby Maw, MD  b complex vitamins capsule Take 1 capsule by mouth daily. 02/23/19  Yes Libby Maw, MD  bimatoprost (LUMIGAN) 0.01 % SOLN Place 1 drop into both eyes at bedtime. Can not take Generic   Yes [provider]  brimonidine (ALPHAGAN P) 0.1 % SOLN Place 1 drop into both eyes 2 (two) times daily. Can not take generic   Yes [provider]  Calcium Citrate-Vitamin D (CALCIUM CITRATE + D PO) Take 1 tablet by mouth 2 (two) times daily.    Yes [provider]  cholecalciferol (VITAMIN D3) 25 MCG (1000 UNIT) tablet Take 3,000 Units by  mouth daily.   Yes [provider]  cholestyramine (QUESTRAN) 4 g packet [place] 1 PACKET BY MOUTH TWICE DAILY Patient taking differently: Take 4 g by mouth daily.  12/19/18  Yes Zehr, Laban Emperor, PA-C  esomeprazole (NEXIUM) 20 MG capsule Take 20 mg by mouth daily.    Yes [provider]  Iron, Ferrous Sulfate, 325 (65 Fe) MG TABS Take 1 tablet by mouth every other day. 02/23/19  Yes Libby Maw,  MD  levothyroxine (SYNTHROID) 75 MCG tablet TAKE 1 TABLET(75 MCG) BY MOUTH DAILY 03/09/19  Yes Libby Maw, MD  chlorthalidone (HYGROTON) 25 MG tablet Take 1 tablet (25 mg total) by mouth daily. Patient not taking: Reported on 06/27/2019 06/15/19   Libby Maw, MD  pyridOXINE (VITAMIN B-6) 100 MG tablet Take 100 mg by mouth daily.    [provider]    Allergies    Sulfa antibiotics, Gabapentin, Lyrica [pregabalin], and Paxil [paroxetine]  Review of Systems   Review of Systems  Constitutional: Negative for activity change, appetite change, chills, diaphoresis, fatigue and fever.  HENT: Negative.  Negative for ear pain and sore throat.   Eyes: Negative.  Negative for pain and visual disturbance.  Respiratory: Negative.  Negative for cough and shortness of breath.   Cardiovascular: Negative.  Negative for chest pain and palpitations.  Gastrointestinal: Negative.  Negative for abdominal pain, constipation, diarrhea, nausea and vomiting.  Endocrine: Negative.   Genitourinary: Negative.  Negative for dysuria and hematuria.  Musculoskeletal: Negative.  Negative for arthralgias, back pain, gait problem, myalgias and neck pain.  Skin: Positive for color change. Negative for rash.  Neurological: Positive for syncope and weakness (generalized). Negative for dizziness, seizures, facial asymmetry, speech difficulty, light-headedness, numbness and headaches.  Hematological: Negative.  Does not bruise/bleed easily.  Psychiatric/Behavioral: Negative.   Negative for confusion and decreased concentration.  All other systems reviewed and are negative.   Physical Exam Updated Vital Signs BP (!) 167/83   Pulse 75   Temp 98.1 F (36.7 C) (Oral)   Resp 20   SpO2 99%   Physical Exam Vitals and nursing note reviewed.  Constitutional:      General: She is not in acute distress.    Appearance: Normal appearance. She is well-developed and normal weight. She is not ill-appearing, toxic-appearing or diaphoretic.  HENT:     Head: Normocephalic and atraumatic.     Right Ear: External ear normal.     Left Ear: External ear normal.     Nose: Nose normal.     Mouth/Throat:     Mouth: Mucous membranes are moist.     Pharynx: Oropharynx is clear.  Eyes:     General: No visual field deficit.    Extraocular Movements: Extraocular movements intact.     Conjunctiva/sclera: Conjunctivae normal.     Pupils: Pupils are equal, round, and reactive to light.  Cardiovascular:     Rate and Rhythm: Normal rate and regular rhythm.     Pulses: Normal pulses.     Heart sounds: No murmur.  Pulmonary:     Effort: Pulmonary effort is normal. No respiratory distress.     Breath sounds: Normal breath sounds. No wheezing or rales.  Chest:     Chest wall: No tenderness.  Abdominal:     General: Abdomen is flat.     Palpations: Abdomen is soft.     Tenderness: There is no abdominal tenderness. There is no right CVA tenderness, left CVA tenderness or guarding.  Musculoskeletal:        General: No swelling, tenderness or deformity.     Cervical back: Normal range of motion and neck supple. No rigidity or tenderness.     Right lower leg: Edema present.     Left lower leg: Edema present.  Skin:    General: Skin is warm and dry.     Coloration: Skin is not jaundiced or pale.  Neurological:     General: No focal  deficit present.     Mental Status: She is alert and oriented to person, place, and time.     Cranial Nerves: Cranial nerves are intact. No cranial  nerve deficit, dysarthria or facial asymmetry.     Sensory: Sensation is intact. No sensory deficit.     Motor: Motor function is intact. No weakness, tremor or abnormal muscle tone.     Coordination: Coordination is intact. Finger-Nose-Finger Test normal.  Psychiatric:        Mood and Affect: Mood normal.        Behavior: Behavior normal.        Thought Content: Thought content normal.        Judgment: Judgment normal.     ED Results / Procedures / Treatments   Labs (all labs ordered are listed, but only abnormal results are displayed) Labs Reviewed  CBC WITH DIFFERENTIAL/PLATELET - Abnormal; Notable for the following components:      Result Value   WBC 11.2 (*)    Platelets 429 (*)    Neutro Abs 9.0 (*)    All other components within normal limits  COMPREHENSIVE METABOLIC PANEL - Abnormal; Notable for the following components:   Sodium 127 (*)    Chloride 90 (*)    Glucose, Bld 116 (*)    Total Protein 6.4 (*)    All other components within normal limits  URINALYSIS, ROUTINE W REFLEX MICROSCOPIC - Abnormal; Notable for the following components:   Leukocytes,Ua SMALL (*)    All other components within normal limits  I-STAT CHEM 8, ED - Abnormal; Notable for the following components:   Sodium 126 (*)    Chloride 91 (*)    BUN 25 (*)    Glucose, Bld 113 (*)    Calcium, Ion 1.11 (*)    All other components within normal limits  SARS CORONAVIRUS 2 BY RT PCR (HOSPITAL ORDER, [place] [facility])  URINE CULTURE  MAGNESIUM  BASIC METABOLIC PANEL  CBC  TROPONIN I (HIGH SENSITIVITY)  TROPONIN I (HIGH SENSITIVITY)    EKG EKG Interpretation  Date/Time:  Saturday Jun 27 2019 18:57:32 EDT Ventricular Rate:  71 PR Interval:    QRS Duration: 103 QT Interval:  422 QTC Calculation: 459 R Axis:   26 Text Interpretation: Sinus rhythm Prolonged PR interval Abnrm T, probable ischemia, anterolateral lds No old tracing to compare Confirmed by Malvin Johns  [id number]) on 06/27/2019 8:34:47 PM   Radiology DG Chest Portable 1 View  Result Date: 06/27/2019 CLINICAL DATA:  Syncope. EXAM: PORTABLE CHEST 1 VIEW COMPARISON:  December 25, 2018 FINDINGS: The heart size and mediastinal contours are within normal limits. Both lungs are clear. The visualized skeletal structures are unremarkable. IMPRESSION: No active disease. Electronically Signed   By: Constance Holster M.D.   On: 06/27/2019 19:55    Procedures Procedures (including critical care time)  Medications Ordered in ED Medications  cholestyramine (QUESTRAN) packet 4 g (4 g Oral Given 06/28/19 0122)  levothyroxine (SYNTHROID) tablet 75 mcg (has no administration in time range)  pantoprazole (PROTONIX) EC tablet 40 mg (has no administration in time range)  ferrous sulfate tablet 325 mg (has no administration in time range)  B-complex with vitamin C tablet 1 tablet (has no administration in time range)  cholecalciferol (VITAMIN D3) tablet 3,000 Units (has no administration in time range)  calcium carbonate (OS-CAL - dosed in mg of elemental calcium) tablet 500 mg of elemental calcium (has no administration in time range)  brimonidine (  ALPHAGAN) 0.2 % ophthalmic solution 1 drop (1 drop Both Eyes Not Given 06/28/19 0123)  latanoprost (XALATAN) 0.005 % ophthalmic solution 1 drop (1 drop Both Eyes Not Given 06/28/19 0123)  enoxaparin (LOVENOX) injection 40 mg (has no administration in time range)  acetaminophen (TYLENOL) tablet 650 mg (has no administration in time range)    Or  acetaminophen (TYLENOL) suppository 650 mg (has no administration in time range)  lactated ringers bolus 500 mL (0 mLs Intravenous Stopped 06/27/19 2235)    ED Course  I have reviewed the triage vital signs and the nursing notes.  Pertinent labs & imaging results that were available during my care of the patient were reviewed by me and considered in my medical decision making (see chart for details).    MDM  Rules/Calculators/A&P                      Patient is a 79 year old female who presents for syncopal episode.  Syncopal episode occurred at home, at rest.  She did not have a prodrome, although she reports feeling weak earlier this afternoon, prior to the episode.  She denied any preceding chest pain or palpitations.  Episode did not involve a traumatic fall.  She was able to be gently eased to the ground by a friend.  Upon arrival in the ED, she reports that she is feeling back at her baseline.  EMS reports that on scene, patient had hypotension in the range of 70/50.  250 cc of IVF was given during transit.  Upon arrival in the ED, patient is normotensive.  She is alert and oriented.  She has no focal findings on neurologic exam.  No heart murmurs are appreciated.  She has no areas of pain or tenderness.  EKG showed prolonged PR (251 ms) and deep T wave inversions in V2.  Given normal T waves in V3, do not suspect [facility].  Given her age, absence of prodrome, possibly prolonged LOC, and EKG abnormalities, patient is at high risk for adverse event following syncopal episode.  Patient was kept on cardiac monitor.  Laboratory studies were ordered.  500 cc bolus of IVF was ordered.  Labs are notable for hyponatremia (127).  Previous labs show baseline sodium in the low 130s.  Additionally, there is mild leukocytosis (11.2) and thrombocytosis (429).  Initial troponin was 17, as was the repeat.  Urine study showed no evidence of acute infection.  Patient remained asymptomatic in the ED, while resting in bed.  Trial of ambulation was deferred.  She was admitted to medicine service for high risk syncope.  Final Clinical Impression(s) / ED Diagnoses Final diagnoses:  Syncope and collapse  Hyponatremia    Rx / DC Orders ED Discharge Orders    None       Godfrey Pick, MD 06/28/19 3159    Malvin Johns, MD 07/01/19 1250

## 2019-06-27 NOTE — H&P (Signed)
[facility] Admission History and Physical Service Pager: [phone]  Patient name: Katie [name] Medical record number: [id number] Date of birth: 1940/06/29 Age: 79 y.o. Gender: female  Primary Care Provider: Libby Maw, MD Consultants: none Code Status: DNR  Chief Complaint: syncopal event  Assessment and Plan: Katie [name] is a 79 y.o. female presenting with syncopal event. PMH is significant for osteoporosis, hypothyroidism, HTN.   Syncopal event Patient with syncopal event earlier today in setting of not drinking many fluids and missing a meal during lunch.  Patient reports feeling dizzy and trying to eat a doughnut and juice because she believed her blood sugar was low.  She does note that she then felt dizzy and then had loss of consciousness.  She reports her neighbor called EMS and after receiving fluids did feel much better.  She does not know how long she was unconscious but does not believe it was very long.  She states she felt clammy upon waking.  She is now at her baseline.  She denies any focal weakness, numbness or speech change.  She reports her neighbor did not tell her of any seizure activity.  Patient denies any palpitations or chest pain.  She denies any shortness of breath.  EKG without any arrhythmia.  Patient hypertensive here in the ED.  Differential includes hypovolemia or hypoglycemia given history.  Less likely TIA, seizure or arrhythmia.  Will admit for observation overnight on cardiac telemetry. -Place in observation, attending Dr. Erin Hearing -Continuous cardiac monitoring -PT/OT eval and treat -Vitals per routine -Consider echo given patient's history of lower extremity edema however asymptomatic -A.m. BMP, CBC -Consider orthostatics  Hypertension Patient recently started on chlorthalidone on 06/15/2019 but has not taken in the past 3 days due to having low blood pressures. -Monitor  vitals  Hyponatremia Patient with history of chronic hyponatremia with sodium of 128 01/2019.  Patient here with sodium of 127.  Patient has received 750 mL of NS. -Monitor on BMP  Hypothyroidism Patient on levothyroxine 75 MCG. -Continue current management  Osteoporosis Patient on alendronate 70 mg with history of hip fracture and replacement.  H/o endometrial cancer  FEN/GI: regular diet Prophylaxis: lovenox  Disposition: place in observation  History of Present Illness:  Katie [name] is a 79 y.o. female presenting with syncopal event.  Patient states that she is now back to her baseline.  She states that earlier today she did not eat her lunch and she felt as if her blood sugar dropped.  She tried to eat a doughnut and juice but then while she was sitting in her chair she felt lightheaded dizzy and then had a syncopal event.  She states that her neighbor was there to help her off the ground and to call 911.  Patient received 250 mL of fluid by EMS and was feeling much better when she arrived to the emergency room.  She is currently denying any symptoms.  She states she did not feel like her heart was racing or have any chest pain or shortness of breath prior to her syncopal event.  Patient reports that she is often not great at remembering to drink water and had not drink very much water during the day.  She states she recently had some medication changes after her husband passed.  She was given chlorthalidone for lower extremity swelling but only took this for a few days before noting that her blood pressure was dropping so she stopped taking this medication.  She was  also given Paxil but she did not start taking this medication due to her daughter's advice.  Review Of Systems: Per HPI with the following additions:   Review of Systems  Constitutional: Negative for chills and fever.  HENT: Negative for congestion.   Eyes: Negative for blurred vision and double vision.   Respiratory: Negative for cough and shortness of breath.   Cardiovascular: Positive for leg swelling. Negative for chest pain and palpitations.  Gastrointestinal: Negative for abdominal pain, constipation, diarrhea, nausea and vomiting.  Genitourinary: Negative for dysuria and urgency.  Musculoskeletal: Negative for falls.  Neurological: Positive for dizziness, loss of consciousness and headaches. Negative for tingling, speech change, focal weakness and weakness.    Patient Active Problem List   Diagnosis Date Noted  . Syncope 06/27/2019  . Reactive airway disease 06/15/2019  . Muscle cramps 06/15/2019  . Essential hypertension 06/15/2019  . Elevated BP without diagnosis of hypertension 05/29/2019  . Stress and adjustment reaction 05/29/2019  . Iron deficiency 02/23/2019  . B12 deficiency 02/23/2019  . Hospital discharge follow-up 02/20/2019  . Anemia 02/20/2019  . 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 Vitamin D deficiency 09/15/2018  . Pain due to onychomycosis of toenails of both feet 08/05/2018  . Osteoporosis without current pathological fracture 03/17/2018  . Healthcare maintenance 03/17/2018  . Elevated LDL cholesterol level 03/17/2018  . History of total hip arthroplasty, left 01/23/2018  . Cellulitis of right upper extremity 09/26/2017  . Left flank pain 08/26/2017  . OA (osteoarthritis) of knee 06/03/2017  . Loose stools 05/30/2017  . Chronic diarrhea 05/30/2017  . 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 Osteoarthritis of right knee 05/27/2017  . Labyrinthitis 04/18/2017  . Need for assistance due to unsteady gait 04/18/2017  . Age-related osteoporosis without current pathological fracture 01/23/2017  . Acquired hypothyroidism 01/23/2017  . History of endometrial cancer 01/23/2017    Past Medical History: Past Medical History:  Diagnosis Date  . Arthritis   . Cancer [facility])    Endometrial  . Complication of anesthesia    very hard to wake up from  Anesthesia- then when wakes up feels like she is going to faint or have vomiting  . Family history of adverse reaction to anesthesia    daughter has a hard time waking up from Anesthesia  . GERD (gastroesophageal reflux disease)   . Glaucoma   . Hypothyroidism   . 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 PONV (postoperative nausea and vomiting)   . Thyroid disease     Past Surgical History: Past Surgical History:  Procedure Laterality Date  . ABDOMINAL HYSTERECTOMY    . APPENDECTOMY    . BREAST EXCISIONAL BIOPSY Right    benign, was a cyst  . BREAST SURGERY    . CHOLECYSTECTOMY    . FEMUR SURGERY  06/2013  . TOTAL KNEE ARTHROPLASTY Right 01/05/2019   Procedure: TOTAL KNEE ARTHROPLASTY;  Surgeon: Gaynelle Arabian, MD;  Location: [facility] ORS;  Service: Orthopedics;  Laterality: Right;  66min    Social History: Social History   Tobacco Use  . Smoking status: Never Smoker  . Smokeless tobacco: Never Used  Substance Use Topics  . Alcohol use: No  . Drug use: No   Additional social history: Lives alone, recently lost her husband Please also refer to relevant sections of EMR.  Family History: Family History  Problem Relation Age of Onset  . Heart disease Father   . Hearing  loss Maternal Grandmother   . Hearing loss Maternal Grandfather   . Cancer Paternal Grandfather   . Cancer Sister    Allergies and Medications: Allergies  Allergen Reactions  . Sulfa Antibiotics Rash  . Gabapentin Other (See Comments)    unknown  . Lyrica [Pregabalin] Other (See Comments)    unknown  . Paxil [Paroxetine]     unknown   No current facility-administered medications on file prior to encounter.   Current Outpatient Medications on File Prior to Encounter  Medication Sig Dispense Refill  . albuterol (VENTOLIN HFA) 108 (90 Base) MCG/ACT inhaler Inhale 1-2 puffs into the lungs every 6 (six) hours as needed for wheezing or  shortness of breath. 18 g 0  . alendronate (FOSAMAX) 70 MG tablet Take 1 tablet (70 mg total) by mouth once a week. Give with at least 8 ounces of plain water 30 min prior to breakfast.  Patient should be instructed to stay upright (not to lie down) for at least 30 minutes and until after first food of the day (to reduce esophageal irritation). Wednesday 12 tablet 1  . b complex vitamins capsule Take 1 capsule by mouth daily. 90 capsule 1  . bimatoprost (LUMIGAN) 0.01 % SOLN Place 1 drop into both eyes at bedtime. Can not take Generic    . brimonidine (ALPHAGAN P) 0.1 % SOLN Place 1 drop into both eyes 2 (two) times daily. Can not take generic    . Calcium Citrate-Vitamin D (CALCIUM CITRATE + D PO) Take 1 tablet by mouth 2 (two) times daily.     . cholecalciferol (VITAMIN D3) 25 MCG (1000 UNIT) tablet Take 3,000 Units by mouth daily.    . cholestyramine (QUESTRAN) 4 g packet MIX AND DRINK 1 PACKET BY MOUTH TWICE DAILY (Patient taking differently: Take 4 g by mouth daily. ) 180 each 1  . esomeprazole (NEXIUM) 20 MG capsule Take 20 mg by mouth daily.     . Iron, Ferrous Sulfate, 325 (65 Fe) MG TABS Take 1 tablet by mouth every other day. 90 tablet 1  . levothyroxine (SYNTHROID) 75 MCG tablet TAKE 1 TABLET(75 MCG) BY MOUTH DAILY 90 tablet 3  . chlorthalidone (HYGROTON) 25 MG tablet Take 1 tablet (25 mg total) by mouth daily. (Patient not taking: Reported on 06/27/2019) 90 tablet 1  . pyridOXINE (VITAMIN B-6) 100 MG tablet Take 100 mg by mouth daily.      Objective: BP (!) 173/87   Pulse 67   Temp 97.9 F (36.6 C) (Oral)   Resp 17   SpO2 100%  Exam: General: NAD, pleasant Eyes: PERRL, EOMI, no conjunctival pallor or injection ENTM: Moist mucous membranes, no pharyngeal erythema or exudate Neck: Supple Cardiovascular: RRR, no m/r/g, nonpitting LE edema Respiratory: CTA BL, normal work of breathing MSK: moves 4 extremities equally Derm: no rashes appreciated Neuro: CN II-XII grossly  intact Psych: AOx3, appropriate affect   Labs and Imaging: CBC BMET  Recent Labs  Lab 06/27/19 2046 06/27/19 2046 06/27/19 2057  WBC 11.2*  --   --   HGB 13.2   < > 14.3  HCT 40.3   < > 42.0  PLT 429*  --   --    < > = values in this interval not displayed.   Recent Labs  Lab 06/27/19 2046 06/27/19 2046 06/27/19 2057  NA 127*   < > 126*  K 4.6   < > 4.2  CL 90*   < > 91*  CO2 26  --   --  BUN 19   < > 25*  CREATININE 0.86   < > 0.80  GLUCOSE 116*   < > 113*  CALCIUM 9.0  --   --    < > = values in this interval not displayed.      Makinna Andy, [place], DO 06/27/2019, 10:32 PM PGY-3, [place] Intern pager: [phone], text pages welcome

## 2019-06-27 NOTE — ED Triage Notes (Signed)
Pt BIB [facility] from home c/o syncopal episode. Pt stated she did not eat lunch and was out running errands today. Pt came home and the neighbor came by to visit. Pt stated to the neighbor that she felt funny and she put her head down. Neighbor states pt then passed out or "melted" in her arms and she lowered pt to the ground. Pt denies any pain or hitting her head. EMS stated upon arrival pt's BP was 72/36. Pt received 248ml bolus NS en route and pt's BP was 112/56 after recheck.

## 2019-06-28 ENCOUNTER — Observation Stay (HOSPITAL_BASED_OUTPATIENT_CLINIC_OR_DEPARTMENT_OTHER): Payer: Medicare Other

## 2019-06-28 DIAGNOSIS — R55 Syncope and collapse: Secondary | ICD-10-CM | POA: Diagnosis not present

## 2019-06-28 LAB — BASIC METABOLIC PANEL
Anion gap: 8 (ref 5–15)
BUN: 13 mg/dL (ref 8–23)
CO2: 29 mmol/L (ref 22–32)
Calcium: 8.8 mg/dL — ABNORMAL LOW (ref 8.9–10.3)
Chloride: 93 mmol/L — ABNORMAL LOW (ref 98–111)
Creatinine, Ser: 0.7 mg/dL (ref 0.44–1.00)
GFR calc Af Amer: >60 mL/min (ref >60)
GFR calc non Af Amer: >60 mL/min (ref >60)
Glucose, Bld: 98 mg/dL (ref 70–99)
Potassium: 3.5 mmol/L (ref 3.5–5.1)
Sodium: 130 mmol/L — ABNORMAL LOW (ref 135–145)

## 2019-06-28 LAB — CBC
HCT: 36.3 % (ref 36.0–46.0)
Hemoglobin: 11.9 g/dL — ABNORMAL LOW (ref 12.0–15.0)
MCH: 29 pg (ref 26.0–34.0)
MCHC: 32.8 g/dL (ref 30.0–36.0)
MCV: 88.3 fL (ref 80.0–100.0)
Platelets: 398 10*3/uL (ref 150–400)
RBC: 4.11 MIL/uL (ref 3.87–5.11)
RDW: 13.1 % (ref 11.5–15.5)
WBC: 7 10*3/uL (ref 4.0–10.5)
nRBC: 0 % (ref 0.0–0.2)

## 2019-06-28 LAB — SARS CORONAVIRUS 2 BY RT PCR (HOSPITAL ORDER, PERFORMED IN ~~LOC~~ HOSPITAL LAB): SARS Coronavirus 2: NEGATIVE

## 2019-06-28 LAB — URINALYSIS, ROUTINE W REFLEX MICROSCOPIC
Bacteria, UA: NONE SEEN
Bilirubin Urine: NEGATIVE
Glucose, UA: NEGATIVE mg/dL
Hgb urine dipstick: NEGATIVE
Ketones, ur: NEGATIVE mg/dL
Nitrite: NEGATIVE
Protein, ur: NEGATIVE mg/dL
Specific Gravity, Urine: 1.013 (ref 1.005–1.030)
pH: 5 (ref 5.0–8.0)

## 2019-06-28 LAB — ECHOCARDIOGRAM COMPLETE
Height: 60 in
Weight: 2203.2 oz

## 2019-06-28 MED ORDER — VITAMIN D 25 MCG (1000 UNIT) PO TABS
3000.0000 [IU] | ORAL_TABLET | Freq: Every day | ORAL | Status: DC
  Administered 2019-06-28 – 2019-06-29 (×2): 3000 [IU] via ORAL
  Filled 2019-06-28 (×2): qty 3

## 2019-06-28 MED ORDER — LATANOPROST 0.005 % OP SOLN
1.0000 [drp] | Freq: Every day | OPHTHALMIC | Status: DC
  Filled 2019-06-28: qty 2.5

## 2019-06-28 MED ORDER — BRIMONIDINE TARTRATE 0.2 % OP SOLN
1.0000 [drp] | Freq: Two times a day (BID) | OPHTHALMIC | Status: DC
  Filled 2019-06-28: qty 5

## 2019-06-28 MED ORDER — CALCIUM CARBONATE 1250 (500 CA) MG PO TABS
1.0000 | ORAL_TABLET | Freq: Two times a day (BID) | ORAL | Status: DC
  Administered 2019-06-28 – 2019-06-29 (×3): 500 mg via ORAL
  Filled 2019-06-28 (×3): qty 1

## 2019-06-28 MED ORDER — FERROUS SULFATE 325 (65 FE) MG PO TABS
325.0000 mg | ORAL_TABLET | ORAL | Status: DC
  Administered 2019-06-28: 325 mg via ORAL
  Filled 2019-06-28 (×2): qty 1

## 2019-06-28 MED ORDER — ACETAMINOPHEN 325 MG PO TABS: 650.0000 mg | ORAL_TABLET | Freq: Four times a day (QID) | ORAL | Status: DC | PRN

## 2019-06-28 MED ORDER — ENOXAPARIN SODIUM 40 MG/0.4ML ~~LOC~~ SOLN
40.0000 mg | Freq: Every day | SUBCUTANEOUS | Status: DC
  Administered 2019-06-28 – 2019-06-29 (×2): 40 mg via SUBCUTANEOUS
  Filled 2019-06-28 (×2): qty 0.4

## 2019-06-28 MED ORDER — B COMPLEX-C PO TABS
1.0000 | ORAL_TABLET | Freq: Every day | ORAL | Status: DC
  Filled 2019-06-28: qty 1

## 2019-06-28 MED ORDER — CHOLESTYRAMINE 4 G PO PACK
4.0000 g | PACK | Freq: Every day | ORAL | Status: DC
  Administered 2019-06-28 (×2): 4 g via ORAL
  Filled 2019-06-28 (×3): qty 1

## 2019-06-28 MED ORDER — LEVOTHYROXINE SODIUM 75 MCG PO TABS
75.0000 ug | ORAL_TABLET | Freq: Every day | ORAL | Status: DC
  Administered 2019-06-28 – 2019-06-29 (×2): 75 ug via ORAL
  Filled 2019-06-28 (×2): qty 1

## 2019-06-28 MED ORDER — PANTOPRAZOLE SODIUM 40 MG PO TBEC
40.0000 mg | DELAYED_RELEASE_TABLET | Freq: Every day | ORAL | Status: DC
  Filled 2019-06-28 (×2): qty 1

## 2019-06-28 MED ORDER — ACETAMINOPHEN 650 MG RE SUPP: 650.0000 mg | Freq: Four times a day (QID) | RECTAL | Status: DC | PRN

## 2019-06-28 NOTE — Evaluation (Signed)
Occupational Therapy Evaluation Patient Details Name: Katie [name] MRN: [id number] DOB: 1940-04-10 Today's Date: 06/28/2019    History of Present Illness Katie [name] is a 79 y.o. female presenting with syncopal event. PMH is significant for osteoporosis, hypothyroidism, HTN.    Clinical Impression   This 79 y/o female presents with the above. PTA pt reports mod independence with ADL and functional mobility using SPC; currently living alone. Today pt completing ADL and mobility tasks with and without RW at supervision level including LB and stand grooming ADL. Pt denies dizziness with activity and BP stable throughout. Suspect pt is nearing her baseline and anticipate good progress with therapies. She will benefit from continued acute OT services and recommend follow up [facility] services after discharge to maximize her overall safety and independence with ADL and mobility. Will follow.     Follow Up Recommendations  Home health OT;Supervision - Intermittent    Equipment Recommendations  None recommended by OT           Precautions / Restrictions Precautions Precautions: Fall Restrictions Weight Bearing Restrictions: No      Mobility Bed Mobility Overal bed mobility: Needs Assistance Bed Mobility: Sit to Supine       Sit to supine: Supervision;HOB elevated   General bed mobility comments: for safety   Transfers Overall transfer level: Needs assistance Equipment used: None Transfers: Sit to/from Stand Sit to Stand: Supervision         General transfer comment: for balance and safety, stood from EOB    Balance Overall balance assessment: Needs assistance Sitting-balance support: Feet supported Sitting balance-Leahy Scale: Good Sitting balance - Comments: able to lean forward to doff shoes   Standing balance support: Single extremity supported;No upper extremity supported;During functional activity Standing balance-Leahy Scale: Fair Standing balance  comment: pt typically reliant on at least single UE support for dynamic tasks                            ADL either performed or assessed with clinical judgement   ADL Overall ADL's : Needs assistance/impaired Eating/Feeding: Modified independent;Sitting   Grooming: Wash/dry face;Oral care;Supervision/safety;Standing   Upper Body Bathing: Supervision/ safety;Sitting   Lower Body Bathing: Supervison/ safety;Sit to/from stand;Sitting/lateral leans   Upper Body Dressing : Modified independent;Sitting   Lower Body Dressing: Supervision/safety;Sit to/from stand;Sitting/lateral leans   Toilet Transfer: Supervision/safety;Ambulation Toilet Transfer Details (indicate cue type and reason): mobilizing with RW with longer distances and without AD short distances in room Toileting- Clothing Manipulation and Hygiene: Supervision/safety;Sitting/lateral lean;Sit to/from stand       Functional mobility during ADLs: Supervision/safety;Cane;Rolling walker       Vision         Perception     Praxis      Pertinent Vitals/Pain Pain Assessment: Faces Faces Pain Scale: Hurts little more Pain Location: R knee intermittently "catching"  Pain Descriptors / Indicators: Grimacing;Sharp Pain Intervention(s): Monitored during session;Repositioned     Hand Dominance Right   Extremity/Trunk Assessment Upper Extremity Assessment Upper Extremity Assessment: Overall WFL for tasks assessed   Lower Extremity Assessment Lower Extremity Assessment: Defer to PT evaluation   Cervical / Trunk Assessment Cervical / Trunk Assessment: Normal   Communication Communication Communication: No difficulties   Cognition Arousal/Alertness: Awake/alert Behavior During Therapy: WFL for tasks assessed/performed Overall Cognitive Status: Within Functional Limits for tasks assessed  General Comments  BP 169/90 in sitting after mobilizing with PT  recently in hallway and prior to additional standing activity, 170/88 post standing activity     Exercises     Shoulder Instructions      Home Living Family/patient expects to be discharged to:: Private residence Living Arrangements: Alone   Type of Home: Apartment Home Access: Stairs to enter [employer] of Steps: 1(55 paces to get to entry)   Home Layout: One level     Bathroom Shower/Tub: Teacher, early years/pre: Standard     Home Equipment: Cane - single point;Bedside commode;Toilet riser;Tub bench          Prior Functioning/Environment Level of Independence: Independent with assistive device(s)        Comments: using SPC for mobility, completing ADL, iADL tasks with mod independence         OT Problem List: Decreased strength;Decreased activity tolerance;Impaired balance (sitting and/or standing);Decreased knowledge of use of DME or AE;Pain      OT Treatment/Interventions: Self-care/ADL training;Therapeutic exercise;Energy conservation;DME and/or AE instruction;Therapeutic activities;Patient/family education;Balance training    OT Goals(Current goals can be found in the care plan section) Acute Rehab OT Goals Patient Stated Goal: home when able OT Goal Formulation: With patient Time For Goal Achievement: 07/12/19 Potential to Achieve Goals: Good  OT Frequency: Min 2X/week   Barriers to D/C:            Co-evaluation              AM-PAC OT "6 Clicks" Daily Activity     Outcome Measure Help from another person eating meals?: None Help from another person taking care of personal grooming?: A Little Help from another person toileting, which includes using toliet, bedpan, or urinal?: A Little Help from another person bathing (including washing, rinsing, drying)?: A Little Help from another person to put on and taking off regular upper body clothing?: None Help from another person to put on and taking off regular lower body  clothing?: A Little 6 Click Score: 20   End of Session Nurse Communication: Mobility status  Activity Tolerance: Patient tolerated treatment well Patient left: in bed;with call bell/phone within reach;with bed alarm set  OT Visit Diagnosis: Other abnormalities of gait and mobility (R26.89);Unsteadiness on feet (R26.81)                Time: 9798-9211 OT Time Calculation (min): 21 min Charges:  OT General Charges $OT Visit: 1 Visit OT Evaluation $OT Eval Moderate Complexity: [place], OT Acute Rehabilitation Services Pager [phone] Office [phone]    Raymondo Band 06/28/2019, 8:52 AM

## 2019-06-28 NOTE — Progress Notes (Signed)
  Echocardiogram 2D Echocardiogram has been performed.  Katie [name] M 06/28/2019, 3:27 PM

## 2019-06-28 NOTE — Progress Notes (Signed)
Family Medicine Teaching Service Daily Progress Note Intern Pager: [phone]  Patient name: Katie [name] Medical record number: [id number] Date of birth: 07-09-1940 Age: 79 y.o. Gender: female  Primary Care Provider: Libby Maw, MD Consultants: None Code Status: DNR  Pt Overview and Major Events to Date:  5/22: Admitted  Assessment and Plan: Airelle Everding is a 79 y.o. female presenting with syncopal event. PMH is significant for osteoporosis, hypothyroidism, HTN.   Syncopal event Patient remains at her baseline.  She reports that she is feeling well and was able to sleep overnight. She denies any focal weakness, numbness or speech change. Differential includes hypovolemia or hypoglycemia given history. Patient stable for discharge.  -Continuous cardiac monitoring -PT/OT eval and treat -Consider echo given patient's history of lower extremity edema however asymptomatic -A.m. BMP, CBC -f/u orthostatics  Hypertension Patient recently started on chlorthalidone on 06/15/2019 but has not taken in the past 3 days due to having low blood pressures.  Likely the patient will need to discontinue. -Monitor vitals  Hyponatremia Patient with history of chronic hyponatremia with sodium of 128 01/2019.  Patient here with sodium of 127.  Patient has received 750 mL of NS. -Monitor on repeat BMP  Hypothyroidism Patient on levothyroxine 75 MCG. -Continue current management  Osteoporosis Patient on alendronate 70 mg with history of hip fracture and replacement.  H/o endometrial cancer  FEN/GI: regular diet Prophylaxis: lovenox  Disposition: Improved, discharged after PT/OT  Subjective:  Patient reports feeling well this morning.  No complaints or concerns.  Objective: Temp:  [97.9 F (36.6 C)-98.4 F (36.9 C)] 98.4 F (36.9 C) (05/23 0553) Pulse Rate:  [65-79] 79 (05/23 0553) Resp:  [16-22] 18 (05/23 0553) BP: (117-186)/(59-88) 159/76 (05/23 0553) SpO2:   [97 %-100 %] 98 % (05/23 0553) Weight:  [62.5 kg] 62.5 kg (05/22 2355) Physical Exam: General: NAD, pleasant Cardiovascular: RRR, no m/r/g, no LE edema Respiratory: CTA BL, normal work of breathing Gastrointestinal: soft, nontender, nondistended, normoactive BS MSK: moves 4 extremities equally Psych: AOx3, appropriate affect  Laboratory: Recent Labs  Lab 06/27/19 2046 06/27/19 2057  WBC 11.2*  --   HGB 13.2 14.3  HCT 40.3 42.0  PLT 429*  --    Recent Labs  Lab 06/27/19 2046 06/27/19 2057  NA 127* 126*  K 4.6 4.2  CL 90* 91*  CO2 26  --   BUN 19 25*  CREATININE 0.86 0.80  CALCIUM 9.0  --   PROT 6.4*  --   BILITOT 0.8  --   ALKPHOS 42  --   ALT 10  --   AST 33  --   GLUCOSE 116* 113*    Imaging/Diagnostic Tests: DG Chest Portable 1 View  Result Date: 06/27/2019 CLINICAL DATA:  Syncope. EXAM: PORTABLE CHEST 1 VIEW COMPARISON:  December 25, 2018 FINDINGS: The heart size and mediastinal contours are within normal limits. Both lungs are clear. The visualized skeletal structures are unremarkable. IMPRESSION: No active disease. Electronically Signed   By: Constance Holster M.D.   On: 06/27/2019 19:55    Zyiere Rosemond, [place], DO 06/28/2019, 7:45 AM PGY-3, [place] Intern pager: [phone], text pages welcome

## 2019-06-28 NOTE — Plan of Care (Signed)
  Problem: Safety: Goal: Ability to remain free from injury will improve Outcome: Progressing   Problem: Coping: Goal: Level of anxiety will decrease Outcome: Progressing   

## 2019-06-28 NOTE — Care Management Obs Status (Signed)
[place] NOTIFICATION   Patient Details  Name: Katie [name] MRN: [id number] Date of Birth: 11-17-1940   Medicare Observation Status Notification Given:  Yes    Bartholomew Crews, RN 06/28/2019, 2:35 PM

## 2019-06-28 NOTE — Evaluation (Signed)
Physical Therapy Evaluation Patient Details Name: Katie [name] MRN: [id number] DOB: February 11, 1940 Today's Date: 06/28/2019   History of Present Illness  Katie [name] is a 79 y.o. female presenting with syncopal event. PMH is significant for osteoporosis, hypothyroidism, HTN.   Clinical Impression   Pt admitted with above diagnosis. Comes from home where she lives in a single level apartment with one step to enter her home; Had her R knee replaced last November; Independent at baseline, using a cane for ambulation; Presents to PT with gait and balance deficits, history of near fall;  Pt currently with functional limitations due to the deficits listed below (see PT Problem List). Pt will benefit from skilled PT to increase their independence and safety with mobility to allow discharge to the venue listed below.       Follow Up Recommendations Home health PT    Equipment Recommendations  None recommended by PT    Recommendations for Other Services       Precautions / Restrictions Precautions Precautions: Fall Restrictions Weight Bearing Restrictions: No      Mobility  Bed Mobility Overal bed mobility: Needs Assistance Bed Mobility: Supine to Sit     Supine to sit: Supervision Sit to supine: Supervision;HOB elevated   General bed mobility comments: for safety   Transfers Overall transfer level: Needs assistance Equipment used: Rolling walker (2 wheeled)(grab bars in bathroom) Transfers: Sit to/from Stand Sit to Stand: Min guard(without physical contact)         General transfer comment: for balance and safety, stood from EOB; a few tries before successful standing, especially from low commode  Ambulation/Gait Ambulation/Gait assistance: Supervision;Min guard Gait Distance (Feet): 150 Feet Assistive device: Rolling walker (2 wheeled);1 person hand held assist(handheld assist to approximate cane) Gait Pattern/deviations: Step-through pattern     General Gait  Details: Overall uses RW well; noting uses UE support more while in R single limb stance because of recent TKA  Stairs            Wheelchair Mobility    Modified Rankin (Stroke Patients Only)       Balance Overall balance assessment: Needs assistance Sitting-balance support: Feet supported Sitting balance-Leahy Scale: Good Sitting balance - Comments: able to lean forward to doff shoes   Standing balance support: Single extremity supported;No upper extremity supported;During functional activity Standing balance-Leahy Scale: Fair Standing balance comment: pt typically reliant on at least single UE support for dynamic tasks                              Pertinent Vitals/Pain Pain Assessment: Faces Faces Pain Scale: Hurts little more Pain Location: R knee intermittently "catching"  Pain Descriptors / Indicators: Grimacing;Sharp Pain Intervention(s): Monitored during session    Home Living Family/patient expects to be discharged to:: Private residence Living Arrangements: Alone Available Help at Discharge: Neighbor;Available PRN/intermittently Type of Home: Apartment Home Access: Stairs to enter   Entrance Stairs-Number of Steps: 1(55 paces to get to entry) Home Layout: One level Home Equipment: Cane - single point;Bedside commode;Toilet riser;Tub bench      Prior Function Level of Independence: Independent with assistive device(s)         Comments: using SPC for mobility, completing ADL, iADL tasks with mod independence      Hand Dominance   Dominant Hand: Right    Extremity/Trunk Assessment   Upper Extremity Assessment Upper Extremity Assessment: Defer to OT evaluation    Lower Extremity Assessment  Lower Extremity Assessment: Generalized weakness    Cervical / Trunk Assessment Cervical / Trunk Assessment: Normal  Communication   Communication: No difficulties  Cognition Arousal/Alertness: Awake/alert Behavior During Therapy: WFL for  tasks assessed/performed Overall Cognitive Status: Within Functional Limits for tasks assessed                                        General Comments General comments (skin integrity, edema, etc.): BP 169/90 in sitting after mobilizing with PT recently in hallway and prior to additional standing activity, 170/88 post standing activity     Exercises     Assessment/Plan    PT Assessment Patient needs continued PT services  PT Problem List Decreased activity tolerance;Decreased balance       PT Treatment Interventions DME instruction;Gait training;Stair training;Functional mobility training;Therapeutic activities;Therapeutic exercise;Balance training;Patient/family education    PT Goals (Current goals can be found in the Care Plan section)  Acute Rehab PT Goals Patient Stated Goal: home when able PT Goal Formulation: With patient Time For Goal Achievement: 07/05/19 Potential to Achieve Goals: Good    Frequency Min 3X/week   Barriers to discharge        Co-evaluation               AM-PAC PT "6 Clicks" Mobility  Outcome Measure Help needed turning from your back to your side while in a flat bed without using bedrails?: None Help needed moving from lying on your back to sitting on the side of a flat bed without using bedrails?: None Help needed moving to and from a bed to a chair (including a wheelchair)?: None Help needed standing up from a chair using your arms (e.g., wheelchair or bedside chair)?: A Little Help needed to walk in hospital room?: A Little Help needed climbing 3-5 steps with a railing? : A Little 6 Click Score: 21    End of Session   Activity Tolerance: Patient tolerated treatment well Patient left: Other (comment)(in room with OT) Nurse Communication: Mobility status PT Visit Diagnosis: Other abnormalities of gait and mobility (R26.89)(Syncope)    Time: 6389-3734 PT Time Calculation (min) (ACUTE ONLY): 23 min   Charges:   PT  Evaluation $PT Eval Low Complexity: 1 Low PT Treatments $Gait Training: 8-22 mins        Roney Marion, PT  Acute Rehabilitation Services Pager [phone] Office [place] 06/28/2019, 9:58 AM

## 2019-06-28 NOTE — TOC Initial Note (Signed)
Transition of Care [facility]) - Initial/Assessment Note    Patient Details  Name: Katie [name] MRN: [id number] Date of Birth: 02/06/1940  Transition [facility]) CM/SW Contact:    Bartholomew Crews, RN Phone Number: [phone] 06/28/2019, 10:36 AM  Clinical Narrative:                  Spoke with patient at the bedside. PTA home alone stating that her spouse passed away 1 month ago. Normally drives herself, but states that her son in law will likely be the one picking her up at discharge.   Discussed home health recommendations. She is agreeable to PT and OT, but states that she does not need any additional assistance. Referral accepted by [facility] for PT and OT. Patient will need HH orders for PT and OT with Face to Face at time of discharge.   TOC team following for transition needs.   Expected Discharge Plan: [place] Barriers to Discharge: Continued Medical Work up   Patient Goals and CMS Choice Patient states their goals for this hospitalization and ongoing recovery are:: return to her home CMS Medicare.gov Compare Post Acute Care list provided to:: Patient Choice offered to / list presented to : Patient  Expected Discharge Plan and Services Expected Discharge Plan: [place] In-house Referral: NA Discharge Planning Services: CM Consult Post Acute Care Choice: [place] arrangements for the past 2 months: Single Family Home                 DME Arranged: N/A DME Agency: NA       HH Arranged: PT, OT [place] Agency: [facility] Date [place]: 06/28/19 Time [place]: 87 Representative spoke with at [place]: Cassie  Prior Living Arrangements/Services Living arrangements for the past 2 months: [place] with:: Self Patient language and need for interpreter reviewed:: Yes Do you feel safe going back to the place where you live?: Yes      Need for Family Participation in Patient  Care: Yes (Comment) Care giver support system in place?: Yes (comment)   Criminal Activity/Legal Involvement Pertinent to Current Situation/Hospitalization: No - Comment as needed  Activities of Daily Living Home Assistive Devices/Equipment: Cane (specify quad or straight) ADL Screening (condition at time of admission) Patient's cognitive ability adequate to safely complete daily activities?: Yes Is the patient deaf or have difficulty hearing?: No Does the patient have difficulty seeing, even when wearing glasses/contacts?: No Does the patient have difficulty concentrating, remembering, or making decisions?: No Patient able to express need for assistance with ADLs?: Yes Does the patient have difficulty dressing or bathing?: No Independently performs ADLs?: Yes (appropriate for developmental age) Does the patient have difficulty walking or climbing stairs?: Yes Weakness of Legs: None Weakness of Arms/Hands: None  Permission Sought/Granted                  Emotional Assessment Appearance:: Appears stated age Attitude/Demeanor/Rapport: Engaged Affect (typically observed): Accepting Orientation: : Oriented to Self, Oriented to  Time, Oriented to Place, Oriented to Situation Alcohol / Substance Use: Not Applicable Psych Involvement: No (comment)  Admission diagnosis:  Syncope [R55] Patient Active Problem List   Diagnosis Date Noted  . Syncope 06/27/2019  . Reactive airway disease 06/15/2019  . Muscle cramps 06/15/2019  . Essential hypertension 06/15/2019  . Elevated BP without diagnosis of hypertension 05/29/2019  . Stress and adjustment reaction 05/29/2019  . Iron deficiency 02/23/2019  .  B12 deficiency 02/23/2019  . Hospital discharge follow-up 02/20/2019  . Anemia 02/20/2019  . 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 Vitamin D deficiency 09/15/2018  . Pain due to onychomycosis of toenails of both feet 08/05/2018  . Osteoporosis without current  pathological fracture 03/17/2018  . Healthcare maintenance 03/17/2018  . Elevated LDL cholesterol level 03/17/2018  . History of total hip arthroplasty, left 01/23/2018  . Cellulitis of right upper extremity 09/26/2017  . Left flank pain 08/26/2017  . OA (osteoarthritis) of knee 06/03/2017  . Loose stools 05/30/2017  . Chronic diarrhea 05/30/2017  . 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 Osteoarthritis of right knee 05/27/2017  . Labyrinthitis 04/18/2017  . Need for assistance due to unsteady gait 04/18/2017  . Age-related osteoporosis without current pathological fracture 01/23/2017  . Acquired hypothyroidism 01/23/2017  . History of endometrial cancer 01/23/2017   PCP:  Libby Maw, MD Pharmacy:   [facility] [phone] [address], [address] AT [facility] [address] [place] [address] Phone: [phone] Fax: [phone]     Social Determinants of Health ([place]) Interventions    Readmission Risk Interventions No flowsheet data found.

## 2019-06-28 NOTE — Progress Notes (Signed)
Patient arrived to 3E05 from [facility]. A&Ox4. No complaints of pain. SR on telemetry. VSS. No skin breakdown. Patient did not bring hearing aids nor upper partial to hospital. Bed alarm set. Patient advised to call if she needs to get out of bed.

## 2019-06-29 DIAGNOSIS — R55 Syncope and collapse: Secondary | ICD-10-CM | POA: Diagnosis not present

## 2019-06-29 LAB — URINE CULTURE

## 2019-06-29 NOTE — Discharge Summary (Signed)
[facility] Discharge Summary  Patient name: Katie [name] Medical record number: [id number] Date of birth: 1940-08-30 Age: 79 y.o. Gender: female Date of Admission: 06/27/2019  Date of Discharge: Jun 29, 2019 Admitting Physician: Martin[place] Shirley, DO  Primary Care Provider: Libby Maw, MD Consultants: None  Indication for Hospitalization: syncope  Discharge Diagnoses/Problem List:  Syncope Osteoporosis Hypothyroidism History of hypertension  Disposition: To home  Discharge Condition: Stable and improved  Discharge Exam:  Vitals:   06/29/19 0750 06/29/19 1117  BP: 137/65 (!) 158/75  Pulse: 66 67  Resp: 19 17  Temp: 98.2 F (36.8 C) 98.6 F (37 C)  SpO2: 96% 95%  Physical Exam: General: NAD, pleasant 79 yo woman resting comfortably in bed Cardiovascular: RRR, no m/r/g, no LE edema Respiratory: CTA BL, normal work of breathing Gastrointestinal: soft, nontender, nondistended, normoactive BS MSK: moves 4 extremities equally Psych: AOx3, appropriate affect  Brief Hospital Course:  Syncope Patient with syncopal event on 5/22 in setting of not drinking many fluids and missing a meal during lunch. Patient reported feeling dizzy and trying to eat a doughnut and juice because she believed her blood sugar was low.  She noted that after eating, she became dizzy and then had loss of consciousness.  Her neighbor was present and witnessed the event, which was for a few seconds; patient did not hit her head. Her neighbor denies any jerking movements concerning for seizure. She states she felt clammy upon waking. After receiving IVF from EMS and in ED, patient felt much better and returned to her functional baseline. She denied any focal weakness, numbness or speech change; no CP, palpitations, or SOB.  EKG without any arrhythmia, echo reassuringly normal (see detail below). Blood pressure during hospitalization bordered hypotensive, no LE edema  present on day of discharge, but pt has had LE edema for many years. Due to hypotension and only mild to no LE edema, chlorthalidone was discontinued. Recommended patient hydrate well and eat salt containing foods. Patient was seen by physical therapy who recommended HH PT, which was ordered. Patient was at her functional baseline and stable for discharge.  Hyponatremia Long standing hyponatremia with sodium of 128 01/2019.  Patient here with sodium of 127 at admission, with NS IVF improved to 130 at time of discharge. Encouraged hydration and eating salty foods, however, recommend outpatient work up.   Issues for Follow Up:  1. Syncope: encouraging hydration, ensuring no other syncopal events. 2. Hyponatremia: recommend checking BNP for Na and consider work up for hyponatremia with urine osm.  Significant Procedures: None  Significant Labs and Imaging:  Recent Labs  Lab 06/27/19 2046 06/27/19 2057 06/28/19 0746  WBC 11.2*  --  7.0  HGB 13.2 14.3 11.9*  HCT 40.3 42.0 36.3  PLT 429*  --  398   Recent Labs  Lab 06/27/19 2046 06/27/19 2046 06/27/19 2057 06/28/19 0746  NA 127*  --  126* 130*  K 4.6   < > 4.2 3.5  CL 90*  --  91* 93*  CO2 26  --   --  29  GLUCOSE 116*  --  113* 98  BUN 19  --  25* 13  CREATININE 0.86  --  0.80 0.70  CALCIUM 9.0  --   --  8.8*  MG 2.1  --   --   --   ALKPHOS 42  --   --   --   AST 33  --   --   --  ALT 10  --   --   --   ALBUMIN 3.5  --   --   --    < > = values in this interval not displayed.    Echocardiogram IMPRESSIONS 06/28/19  1. Left ventricular ejection fraction, by estimation, is 60 to 65%. The  left ventricle has normal function. The left ventricle has no regional  wall motion abnormalities. There is mild asymmetric left ventricular  hypertrophy of the basal-septal segment.  Left ventricular diastolic parameters are consistent with Grade I  diastolic dysfunction (impaired relaxation). Elevated left ventricular  end-diastolic  pressure. The average left ventricular global longitudinal  strain is -13.4 %.  2. Right ventricular systolic function is normal. The right ventricular  size is normal. There is normal pulmonary artery systolic pressure.  3. The mitral valve is normal in structure. No evidence of mitral valve  regurgitation. No evidence of mitral stenosis.  4. The aortic valve is normal in structure. Aortic valve regurgitation is  not visualized. No aortic stenosis is present.  5. The inferior vena cava is normal in size with greater than 50%  respiratory variability, suggesting right atrial pressure of 3 mmHg.  Results/Tests Pending at Time of Discharge: none  Discharge Medications:  Allergies as of 06/29/2019      Reactions   Sulfa Antibiotics Rash   Gabapentin Other (See Comments)   unknown   Lyrica [pregabalin] Other (See Comments)   unknown   Paxil [paroxetine]    unknown      Medication List    STOP taking these medications   chlorthalidone 25 MG tablet Commonly known as: HYGROTON     TAKE these medications   albuterol 108 (90 Base) MCG/ACT inhaler Commonly known as: VENTOLIN HFA Inhale 1-2 puffs into the lungs every 6 (six) hours as needed for wheezing or shortness of breath.   alendronate 70 MG tablet Commonly known as: FOSAMAX Take 1 tablet (70 mg total) by mouth once a week. Give with at least 8 ounces of plain water 30 min prior to breakfast.  Patient should be instructed to stay upright (not to lie down) for at least 30 minutes and until after first food of the day (to reduce esophageal irritation). Wednesday   Alphagan P 0.1 % Soln Generic drug: brimonidine Place 1 drop into both eyes 2 (two) times daily. Can not take generic   b complex vitamins capsule Take 1 capsule by mouth daily.   CALCIUM CITRATE + D PO Take 1 tablet by mouth 2 (two) times daily.   cholecalciferol 25 MCG (1000 UNIT) tablet Commonly known as: VITAMIN D3 Take 3,000 Units by mouth daily.    cholestyramine 4 g packet Commonly known as: QUESTRAN MIX AND DRINK 1 PACKET BY MOUTH TWICE DAILY What changed: See the new instructions.   esomeprazole 20 MG capsule Commonly known as: NEXIUM Take 20 mg by mouth daily.   Iron (Ferrous Sulfate) 325 (65 Fe) MG Tabs Take 1 tablet by mouth every other day.   levothyroxine 75 MCG tablet Commonly known as: SYNTHROID TAKE 1 TABLET(75 MCG) BY MOUTH DAILY   Lumigan 0.01 % Soln Generic drug: bimatoprost Place 1 drop into both eyes at bedtime. Can not take Generic   pyridOXINE 100 MG tablet Commonly known as: VITAMIN B-6 Take 100 mg by mouth daily.       Discharge Instructions: Please refer to Patient Instructions section of EMR for full details.  Patient was counseled important signs and symptoms that should prompt return  to medical care, changes in medications, dietary instructions, activity restrictions, and follow up appointments.   Follow-Up Appointments: Follow-up Information    [facility] Follow up.   Specialty: [place] Why: the office will call to schedule therapy visits Contact information: [place] [address] [phone]        Libby Maw, MD. Go on 06/30/2019.   Specialty: Family Medicine Why: @11 :30am Contact information: [place] [address] [phone]           Gladys Damme, MD 06/29/2019, 6:33 PM PGY-1, [place]

## 2019-06-29 NOTE — Progress Notes (Signed)
Physical Therapy Treatment Patient Details Name: Katie [name] MRN: [id number] DOB: 16-Aug-1940 Today's Date: 06/29/2019    History of Present Illness Katie [name] is a 79 y.o. female presenting with syncopal event. PMH is significant for osteoporosis, hypothyroidism, HTN.     PT Comments    Pt in bed upon arrival of PT, agreeable to session with focus on progression of ambulation and activity. The pt was able to demo improved stability with transfers from various surfaces during today's session, but continues to prefer use of RW for ambulation rather than progression to use of canes available. The pt was able to demo improvements in ambulation distance with good stability, and VSS remained stable on RA. The pt will continue to benefit from skilled PT to continue to progress functional strength, endurance, and stability to improve safety and independence with mobility at home.    Follow Up Recommendations  Home health PT     Equipment Recommendations  None recommended by PT    Recommendations for Other Services       Precautions / Restrictions Precautions Precautions: Fall Restrictions Weight Bearing Restrictions: No    Mobility  Bed Mobility Overal bed mobility: Modified Independent             General bed mobility comments: pt able to mobilize and don robe/shoes without assist  Transfers Overall transfer level: Modified independent Equipment used: Rolling walker (2 wheeled)(also good use of grab bars in bathroom) Transfers: Sit to/from Stand Sit to Stand: Modified independent (Device/Increase time)         General transfer comment: pt able to stand from EOB and don robe without assist or evidence of instability. Able to stand and wipe in bathroom with good safety awareness  Ambulation/Gait Ambulation/Gait assistance: Supervision Gait Distance (Feet): 350 Feet Assistive device: Rolling walker (2 wheeled) Gait Pattern/deviations: Step-through  pattern Gait velocity: 0.61 m/s Gait velocity interpretation: 1.31 - 2.62 ft/sec, indicative of limited community ambulator General Gait Details: Pt with good use of RW, trialed quad cane and SPC but pt reporting that both canes felt "unstable" and asked to use RW. pt with good clearance of toes bilaterally   Stairs             Wheelchair Mobility    Modified Rankin (Stroke Patients Only)       Balance Overall balance assessment: Needs assistance Sitting-balance support: Feet supported Sitting balance-Leahy Scale: Good Sitting balance - Comments: able to lean forward to don/doff shoes without LOB   Standing balance support: Single extremity supported;No upper extremity supported;During functional activity Standing balance-Leahy Scale: Fair Standing balance comment: pt able to ambulate without AD, prefers RW or at least single UE support                            Cognition Arousal/Alertness: Awake/alert Behavior During Therapy: WFL for tasks assessed/performed Overall Cognitive Status: Within Functional Limits for tasks assessed                                 General Comments: Pt is very pleasant and a joy to work with. Good safety awareness and understanding of importance of mobility      Exercises      General Comments General comments (skin integrity, edema, etc.): VSS during ambulaiton, HR 90s-110), SpO2 95%      Pertinent Vitals/Pain Pain Assessment: No/denies pain Pain Intervention(s): Limited activity within  patient's tolerance;Monitored during session;Repositioned    Home Living                      Prior Function            PT Goals (current goals can now be found in the care plan section) Acute Rehab PT Goals Patient Stated Goal: home when able PT Goal Formulation: With patient Time For Goal Achievement: 07/05/19 Potential to Achieve Goals: Good Progress towards PT goals: Progressing toward goals     Frequency    Min 3X/week      PT Plan Current plan remains appropriate    Co-evaluation              AM-PAC PT "6 Clicks" Mobility   Outcome Measure  Help needed turning from your back to your side while in a flat bed without using bedrails?: None Help needed moving from lying on your back to sitting on the side of a flat bed without using bedrails?: None Help needed moving to and from a bed to a chair (including a wheelchair)?: None Help needed standing up from a chair using your arms (e.g., wheelchair or bedside chair)?: None Help needed to walk in hospital room?: A Little Help needed climbing 3-5 steps with a railing? : A Little 6 Click Score: 22    End of Session Equipment Utilized During Treatment: Gait belt Activity Tolerance: Patient tolerated treatment well Patient left: in chair;with call bell/phone within reach Nurse Communication: Mobility status PT Visit Diagnosis: Other abnormalities of gait and mobility (R26.89)(syncope)     Time: 2671-2458 PT Time Calculation (min) (ACUTE ONLY): 34 min  Charges:  $Gait Training: 23-37 mins                     Karma Ganja, PT, DPT   Acute Rehabilitation Department Pager #: [phone]   Otho Bellows 06/29/2019, 1:17 PM

## 2019-06-29 NOTE — Progress Notes (Signed)
Family Medicine Teaching Service Daily Progress Note Intern Pager: [phone]  Patient name: Emelie Newsom Medical record number: [id number] Date of birth: Apr 07, 1940 Age: 79 y.o. Gender: female  Primary Care Provider: Libby Maw, MD Consultants: None Code Status: DNR  Pt Overview and Major Events to Date:  5/22: Admitted  Assessment and Plan: Janeshia Ciliberto is a 79 y.o. female presenting with syncopal event. PMH is significant for osteoporosis, hypothyroidism, HTN.   Syncopal event Patient remains at her baseline.  Echo was very reassuring, LVEF 60-65%, G1DD, no valvular dysfunction. 1st degree AV block noted on telemetry x2 overnight, patient asymptomatic, does not explain syncope. She denies any focal weakness, numbness or speech change. Differential includes hypovolemia or hypoglycemia given history. Patient stable for discharge today with outpatient follow up. -HH PT/OT orders in  Hypertension Patient borderline hypotensive, 123/54. Patient recently started on chlorthalidone on 06/15/2019 but has not taken in the past 3 days due to having low blood pressures, will discontinue. -Monitor vitals  Hyponatremia- improved Patient with history of chronic hyponatremia with sodium of 128 01/2019.  On admission, Na 127, today Na 130. Recommend outpatient work up.  Hypothyroidism Patient on levothyroxine 75 MCG. -Continue current management  Osteoporosis Patient on alendronate 70 mg with history of hip fracture and replacement.  H/o endometrial cancer  FEN/GI: regular diet Prophylaxis: lovenox  Disposition: Improved, discharge today  Subjective:  Patient reports feeling well this morning.  No complaints or concerns.  Objective: Temp:  [98.2 F (36.8 C)-98.7 F (37.1 C)] 98.4 F (36.9 C) (05/24 0543) Pulse Rate:  [67-76] 67 (05/24 0543) Resp:  [18-19] 18 (05/24 0543) BP: (123-179)/(59-92) 123/59 (05/24 0543) SpO2:  [95 %-99 %] 95 % (05/24  0543) Weight:  [60 kg] 60 kg (05/24 0354) Physical Exam: General: NAD, pleasant Cardiovascular: RRR, no m/r/g, no LE edema Respiratory: CTA BL, normal work of breathing Gastrointestinal: soft, nontender, nondistended, normoactive BS MSK: moves 4 extremities equally Psych: AOx3, appropriate affect  Laboratory: Recent Labs  Lab 06/27/19 2046 06/27/19 2057 06/28/19 0746  WBC 11.2*  --  7.0  HGB 13.2 14.3 11.9*  HCT 40.3 42.0 36.3  PLT 429*  --  398   Recent Labs  Lab 06/27/19 2046 06/27/19 2057 06/28/19 0746  NA 127* 126* 130*  K 4.6 4.2 3.5  CL 90* 91* 93*  CO2 26  --  29  BUN 19 25* 13  CREATININE 0.86 0.80 0.70  CALCIUM 9.0  --  8.8*  PROT 6.4*  --   --   BILITOT 0.8  --   --   ALKPHOS 42  --   --   ALT 10  --   --   AST 33  --   --   GLUCOSE 116* 113* 98    Imaging/Diagnostic Tests: DG Chest Portable 1 View  Result Date: 06/27/2019 CLINICAL DATA:  Syncope. EXAM: PORTABLE CHEST 1 VIEW COMPARISON:  December 25, 2018 FINDINGS: The heart size and mediastinal contours are within normal limits. Both lungs are clear. The visualized skeletal structures are unremarkable. IMPRESSION: No active disease. Electronically Signed   By: Constance Holster M.D.   On: 06/27/2019 19:55    Gladys Damme, MD 06/29/2019, 6:35 AM PGY-3, [place] Intern pager: [phone], text pages welcome

## 2019-06-29 NOTE — Discharge Instructions (Signed)
While in the hospital you were treated for syncope, or fainting. You had heart monitoring and an ultrasound of your heart (echocardiogram) which was very reassuring. Most likely the cause of syncope is multifactorial, but possibly low blood sugar and low blood pressure. We recommend drinking plenty of fluids, eating foods that have salt, and stopping chlorthalidone (high blood pressure medication).  If you have dizziness, light headedness, loss of balance or weakness, lose consciousness, or have chest pain, please go to your nearest emergency department for evaluation.    Syncope Syncope is when you pass out (faint) for a short time. It is caused by a sudden decrease in blood flow to the brain. Signs that you may be about to pass out include:  Feeling dizzy or light-headed.  Feeling sick to your stomach (nauseous).  Seeing all white or all black.  Having cold, clammy skin. If you pass out, get help right away. Call your local emergency services (911 in the [place] Do not drive yourself to the hospital. Follow these instructions at home: Watch for any changes in your symptoms. Take these actions to stay safe and help with your symptoms: Lifestyle  Do not drive, use machinery, or play sports until your doctor says it is okay.  Do not drink alcohol.  Do not use any products that contain nicotine or tobacco, such as cigarettes and e-cigarettes. If you need help quitting, ask your doctor.  Drink enough fluid to keep your pee (urine) pale yellow. General instructions  Take over-the-counter and prescription medicines only as told by your doctor.  If you are taking blood pressure or heart medicine, sit up and stand up slowly. Spend a few minutes getting ready to sit and then stand. This can help you feel less dizzy.  Have someone stay with you until you feel stable.  If you start to feel like you might pass out, lie down right away and raise (elevate) your feet above the level of your  heart. Breathe deeply and steadily. Wait until all of the symptoms are gone.  Keep all follow-up visits as told by your doctor. This is important. Get help right away if:  You have a very bad headache.  You pass out once or more than once.  You have pain in your chest, belly, or back.  You have a very fast or uneven heartbeat (palpitations).  It hurts to breathe.  You are bleeding from your mouth or your bottom (rectum).  You have black or tarry poop (stool).  You have jerky movements that you cannot control (seizure).  You are confused.  You have trouble walking.  You are very weak.  You have vision problems. These symptoms may be an emergency. Do not wait to see if the symptoms will go away. Get medical help right away. Call your local emergency services (911 in the [place] Do not drive yourself to the hospital. Summary  Syncope is when you pass out (faint) for a short time. It is caused by a sudden decrease in blood flow to the brain.  Signs that you may be about to faint include feeling dizzy, light-headed, or sick to your stomach, seeing all white or all black, or having cold, clammy skin.  If you start to feel like you might pass out, lie down right away and raise (elevate) your feet above the level of your heart. Breathe deeply and steadily. Wait until all of the symptoms are gone. This information is not intended to replace advice given to you  by your health care provider. Make sure you discuss any questions you have with your health care provider. Document Revised: 03/06/2017 Document Reviewed: 03/06/2017 Elsevier Patient Education  2020 [employer].

## 2019-06-29 NOTE — Progress Notes (Signed)
Patient discharged: Home with family  Via: Wheelchair   Discharge paperwork given: to patient and family  Reviewed with teach back  IV and telemetry disconnected  Belongings given to patient    

## 2019-06-29 NOTE — TOC Progression Note (Signed)
Transition of Care [facility]) - Progression Note    Patient Details  Name: Katie [name] MRN: [id number] Date of Birth: May 14, 1940  Transition of Care [facility]) CM/SW Contact  Zenon Mayo, RN Phone Number: 06/29/2019, 2:50 PM  Clinical Narrative:    NCM notified Cassie with [facility] that patient is for dc today.    Expected Discharge Plan: [place] Barriers to Discharge: Continued Medical Work up  Expected Discharge Plan and Services Expected Discharge Plan: [place] In-house Referral: NA Discharge Planning Services: CM Consult Post Acute Care Choice: [place] arrangements for the past 2 months: Single Family Home Expected Discharge Date: 06/29/19               DME Arranged: N/A DME Agency: NA       HH Arranged: PT, OT HH Agency: [facility] Date [place]: 06/28/19 Time [place]: [id number] Representative spoke with at [place]: Cassie   Social Determinants of Health ([place]) Interventions    Readmission Risk Interventions No flowsheet data found.

## 2019-06-30 ENCOUNTER — Inpatient Hospital Stay: Payer: Medicare Other | Admitting: Family Medicine

## 2019-06-30 ENCOUNTER — Telehealth: Payer: Self-pay | Admitting: *Deleted

## 2019-06-30 ENCOUNTER — Telehealth: Payer: Self-pay | Admitting: Family Medicine

## 2019-06-30 DIAGNOSIS — E871 Hypo-osmolality and hyponatremia: Secondary | ICD-10-CM | POA: Diagnosis not present

## 2019-06-30 NOTE — Telephone Encounter (Signed)
Gwinda Passe is calling to get verbal orders for patient. Verbal orders are to continue  PT for 2X a week for 3 week and 1x week for 1 week. CB is [phone]

## 2019-06-30 NOTE — Telephone Encounter (Signed)
1st attempt. Unable to reach patient.   

## 2019-06-30 NOTE — Telephone Encounter (Signed)
Verbal order given per [facility] written order will be faxed over.

## 2019-07-01 NOTE — Telephone Encounter (Signed)
I have made two attempts and have been unable to reach patient. Pt has hospital follow up scheduled w/ PCP 07/07/19

## 2019-07-03 ENCOUNTER — Telehealth: Payer: Self-pay | Admitting: Family Medicine

## 2019-07-03 NOTE — Telephone Encounter (Signed)
WK-Requesting verbal orders/plz advise/thx dmf

## 2019-07-03 NOTE — Telephone Encounter (Signed)
Okay for PT.

## 2019-07-03 NOTE — Telephone Encounter (Signed)
Katie [name] is calling and requesting verbal orders for OT. Orders are 1X a week for I week  and 2x a week  for the second week and 1x a week again. Also requesting a order for a social work evaluation. CB is [phone]

## 2019-07-07 ENCOUNTER — Inpatient Hospital Stay: Payer: Medicare Other | Admitting: Family Medicine

## 2019-07-07 NOTE — Telephone Encounter (Signed)
[facility] employee aware/thx dmf

## 2019-07-12 ENCOUNTER — Other Ambulatory Visit: Payer: Self-pay | Admitting: Family Medicine

## 2019-07-12 DIAGNOSIS — J4521 Mild intermittent asthma with (acute) exacerbation: Secondary | ICD-10-CM

## 2019-07-17 ENCOUNTER — Other Ambulatory Visit: Payer: Self-pay

## 2019-07-17 ENCOUNTER — Ambulatory Visit (INDEPENDENT_AMBULATORY_CARE_PROVIDER_SITE_OTHER): Payer: Medicare Other | Admitting: Podiatry

## 2019-07-17 DIAGNOSIS — M79674 Pain in right toe(s): Secondary | ICD-10-CM

## 2019-07-17 DIAGNOSIS — R601 Generalized edema: Secondary | ICD-10-CM

## 2019-07-17 DIAGNOSIS — M79675 Pain in left toe(s): Secondary | ICD-10-CM

## 2019-07-17 DIAGNOSIS — B351 Tinea unguium: Secondary | ICD-10-CM | POA: Diagnosis not present

## 2019-07-20 ENCOUNTER — Encounter: Payer: Self-pay | Admitting: Podiatry

## 2019-07-20 ENCOUNTER — Telehealth: Payer: Self-pay | Admitting: Family Medicine

## 2019-07-20 NOTE — Progress Notes (Signed)
Subjective:  Patient ID: Katie [name], female    DOB: [birth year],  MRN: [id number]  Chief Complaint  Patient presents with  . Callouses    pt is here for a callus trim    79 y.o. female presents with the above complaint.  Patient presents with thickened elongated dystrophic toenails x10 that is causing her a lot of pain when ambulating.  She would like to have them debrided down as she is not able to do it herself.  She states that she also has secondary complaint swelling would like to discuss conservative treatment options if any.  She denies any other acute complaints.  Review of Systems: Negative except as noted in the HPI. Denies N/V/F/Ch.  Past Medical History:  Diagnosis Date  . Arthritis   . Cancer [facility])    Endometrial  . Complication of anesthesia    very hard to wake up from Anesthesia- then when wakes up feels like she is going to faint or have vomiting  . Endometrial cancer [facility])    Per [facility] New Patient Packet   . Family history of adverse reaction to anesthesia    daughter has a hard time waking up from Anesthesia  . GERD (gastroesophageal reflux disease)   . Glaucoma   . History of fainting 2021   Per [facility] Patient Packet   . Hypothyroidism   . 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 PONV (postoperative nausea and vomiting)   . Thyroid disease     Current Outpatient Medications:  .  albuterol (VENTOLIN HFA) 108 (90 Base) MCG/ACT inhaler, Inhale 1-2 puffs into the lungs every 6 (six) hours as needed for wheezing or shortness of breath., Disp: 18 g, Rfl: 0 .  alendronate (FOSAMAX) 70 MG tablet, Take 1 tablet (70 mg total) by mouth once a week. Give with at least 8 ounces of plain water 30 min prior to breakfast.  Patient should be instructed to stay upright (not to lie down) for at least 30 minutes and until after first food of the day (to reduce esophageal irritation). Wednesday, Disp: 12 tablet,  Rfl: 1 .  bimatoprost (LUMIGAN) 0.01 % SOLN, Place 1 drop into both eyes at bedtime. Can not take Generic, Disp: , Rfl:  .  brimonidine (ALPHAGAN P) 0.1 % SOLN, Place 1 drop into both eyes 2 (two) times daily. Can not take generic, Disp: , Rfl:  .  Calcium Citrate-Vitamin D (CALCIUM CITRATE + D PO), Take 1 tablet by mouth 2 (two) times daily. , Disp: , Rfl:  .  Cholecalciferol (VITAMIN D3) 75 MCG (3000 UT) TABS, Take 1 tablet by mouth daily., Disp: , Rfl:  .  cholestyramine (QUESTRAN) 4 g packet, MIX AND DRINK 1 PACKET BY MOUTH TWICE DAILY (Patient taking differently: Take 4 g by mouth daily. ), Disp: 180 each, Rfl: 1 .  esomeprazole (NEXIUM) 20 MG capsule, Take 20 mg by mouth daily. , Disp: , Rfl:  .  Iron, Ferrous Sulfate, 325 (65 Fe) MG TABS, Take 1 tablet by mouth every other day., Disp: 90 tablet, Rfl: 1 .  levothyroxine (SYNTHROID) 75 MCG tablet, TAKE 1 TABLET(75 MCG) BY MOUTH DAILY, Disp: 90 tablet, Rfl: 3 .  vitamin B-12 (CYANOCOBALAMIN) 1000 MCG tablet, Take 1,000 mcg by mouth daily., Disp: , Rfl:   Social History   Tobacco Use  Smoking Status Never Smoker  Smokeless Tobacco Never Used    Allergies  Allergen Reactions  .  Sulfa Antibiotics Rash  . Gabapentin Other (See Comments)    unknown  . Lyrica [Pregabalin] Other (See Comments)    unknown  . Paxil [Paroxetine]     unknown   Objective:  There were no vitals filed for this visit. There is no height or weight on file to calculate BMI. Constitutional Well developed. Well nourished.  Vascular Dorsalis pedis pulses palpable bilaterally. Posterior tibial pulses palpable bilaterally. Capillary refill normal to all digits.  No cyanosis or clubbing noted. Pedal hair growth normal.  Neurologic Normal speech. Oriented to person, place, and time. Epicritic sensation to light touch grossly present bilaterally.  Dermatologic No other open wounds. No skin lesions. Nail Exam: Pt has thick disfigured discolored nails with  subungual debris noted bilateral entire nail hallux through fifth toenails bilateral lower extremity edema nonpitting in nature.  Generalized in nature.  Orthopedic: Normal joint ROM without pain or crepitus bilaterally. No visible deformities. No bony tenderness.   Radiographs: None Assessment:   1. Pain due to onychomycosis of toenails of both feet   2. Generalized edema    Plan:  Patient was evaluated and treated and all questions answered.  Swelling bilateral lower extremity -I explained to patient the etiology of generalized swelling to the bilateral lower extremity.  It appears to be nonpitting swelling.  I discussed with the patient the importance of compression socks and elevation.  Patient states that she will attempt to do that.   Onychomycosis with pain  -Nails palliatively debrided as below. -Educated on self-care  Procedure: Nail Debridement Rationale: pain  Type of Debridement: manual, sharp debridement. Instrumentation: Nail nipper, rotary burr. Number of Nails: 10  Procedures and Treatment: Consent by patient was obtained for treatment procedures. The patient understood the discussion of treatment and procedures well. All questions were answered thoroughly reviewed. Debridement of mycotic and hypertrophic toenails, 1 through 5 bilateral and clearing of subungual debris. No ulceration, no infection noted.  Return Visit-Office Procedure: Patient instructed to return to the office for a follow up visit 3 months for continued evaluation and treatment.  Boneta Lucks, DPM    No follow-ups on file.   No follow-ups on file.

## 2019-07-20 NOTE — Telephone Encounter (Signed)
Katie [name] with [facility] called for verbal orders for physical therapy twice a week x 1 week then once a week x 3 weeks. Her number is [phone].

## 2019-07-21 NOTE — Telephone Encounter (Signed)
Ok for orders? 

## 2019-07-21 NOTE — Telephone Encounter (Signed)
Okay 

## 2019-07-21 NOTE — Telephone Encounter (Signed)
Katie [name] with home health aware/thx dmf

## 2019-07-27 ENCOUNTER — Encounter: Payer: Self-pay | Admitting: Nurse Practitioner

## 2019-07-27 ENCOUNTER — Other Ambulatory Visit: Payer: Self-pay

## 2019-07-27 ENCOUNTER — Ambulatory Visit (INDEPENDENT_AMBULATORY_CARE_PROVIDER_SITE_OTHER): Payer: Medicare Other | Admitting: Nurse Practitioner

## 2019-07-27 VITALS — BP 136/86 | HR 57 | Temp 97.8°F | Resp 16 | Ht 60.0 in | Wt 136.8 lb

## 2019-07-27 DIAGNOSIS — G629 Polyneuropathy, unspecified: Secondary | ICD-10-CM | POA: Diagnosis not present

## 2019-07-27 DIAGNOSIS — M1711 Unilateral primary osteoarthritis, right knee: Secondary | ICD-10-CM | POA: Diagnosis not present

## 2019-07-27 DIAGNOSIS — R55 Syncope and collapse: Secondary | ICD-10-CM

## 2019-07-27 DIAGNOSIS — R0982 Postnasal drip: Secondary | ICD-10-CM | POA: Diagnosis not present

## 2019-07-27 DIAGNOSIS — E559 Vitamin D deficiency, unspecified: Secondary | ICD-10-CM

## 2019-07-27 DIAGNOSIS — J302 Other seasonal allergic rhinitis: Secondary | ICD-10-CM

## 2019-07-27 DIAGNOSIS — E871 Hypo-osmolality and hyponatremia: Secondary | ICD-10-CM

## 2019-07-27 DIAGNOSIS — Z8542 Personal history of malignant neoplasm of other parts of uterus: Secondary | ICD-10-CM

## 2019-07-27 DIAGNOSIS — E611 Iron deficiency: Secondary | ICD-10-CM

## 2019-07-27 DIAGNOSIS — R600 Localized edema: Secondary | ICD-10-CM

## 2019-07-27 NOTE — Patient Instructions (Addendum)
To start claritin and zyrtec 10 mg daily for cough and post-nasal drip (can get generic)  Elevate legs as tolerated  Recommend 1 hour in the afternoon to have "toes above nose" Compression hose/socks during the day Limiting sodium  Increase protein (3 meals a day with good protein) can use protein supplement    Schedule your AWV (telephone visit) 4 weeks  3 months for routine follow up

## 2019-07-27 NOTE — Progress Notes (Signed)
Careteam: Patient Care Team: [name], NP as PCP - General (Geriatric Medicine) Alda Berthold, DO as Consulting Physician (Neurology) Gaynelle Arabian, MD as Consulting Physician (Orthopedic Surgery) Felipa Furnace, DPM as Consulting Physician (Podiatry) Renne Crigler, MD as Referring Physician (Obstetrics and Gynecology) Jola Schmidt, MD as Consulting Physician (Ophthalmology)  PLACE OF SERVICE:  [place] Directive information Does Patient Have a Medical Advance Directive?: Yes, Type of Advance Directive: Living will;Healthcare Power of Attorney, Does patient want to make changes to medical advance directive?: No - Patient declined  Allergies  Allergen Reactions  . Sulfa Antibiotics Rash  . Gabapentin Other (See Comments)    unknown  . Lyrica [Pregabalin] Other (See Comments)    unknown  . Paxil [Paroxetine]     unknown    Chief Complaint  Patient presents with  . Establish Care    New Patient    HPI: Patient is a 79 y.o. female to establish care  Pt with hx of endometrial cancer in 2010 had surgery, chemo and radiation. Daughter feels like she does not feel like it was as advanced as they thought it was. No longer following with oncologist, "Graduated" but gets a CA125 lab yearly through PCP and chest xray.  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 taking fosamax 70 mg weekly for ~ year. 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   GERD- controlled on nexium 20 mg daily  Recently started on iron supplement  Hypothyroid, aquired,  continues on synthroid 75 mcg - been on current dose for over a year  Vit b12 def- on vit b12 1000 mcg daily  She was hospitalized in May due to syncopal episode. Had not eaten and then had episode. No recurrent episode and felt like it was due to the fact she had not eaten.   Daughter feels like she has fluid retention cough, crackles and edema in feet. LE edema- better in the morning worse as the day progresses. Chest xray was WNL during hospitalization last month. Also reports post nasal drip  Review of Systems:  Review of Systems  Constitutional: Negative for chills, fever and weight loss.  HENT: Negative for congestion and tinnitus.        Post nasal drip   Respiratory: Positive for cough. Negative for sputum production and shortness of breath.   Cardiovascular: Positive for leg swelling. Negative for chest pain and palpitations.  Gastrointestinal: Negative for abdominal pain, constipation, diarrhea and heartburn.  Genitourinary: Negative for dysuria, frequency and urgency.  Musculoskeletal: Positive for back pain. Negative for falls, joint pain and myalgias.  Skin: Negative.   Neurological: Negative for dizziness, weakness and headaches.  Psychiatric/Behavioral: Negative for depression and memory loss. The patient does not have insomnia.     Past Medical History:  Diagnosis Date  . Arthritis   . Cancer [facility])    Endometrial  . Complication of anesthesia    very hard to wake up from Anesthesia- then when wakes up feels like she is going to faint or have vomiting  . Endometrial cancer ([place])  Per [facility] New Patient Packet   . Family history of adverse reaction to anesthesia    daughter has a hard time waking up from Anesthesia  . GERD (gastroesophageal reflux disease)   . Glaucoma   . History of fainting 2021   Per [place] Patient Packet   . Hypothyroidism   . 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 PONV  (postoperative nausea and vomiting)   . Thyroid disease    Past Surgical History:  Procedure Laterality Date  . ABDOMINAL HYSTERECTOMY    . APPENDECTOMY  1958  . BREAST EXCISIONAL BIOPSY Right    benign, was a cyst  . BREAST SURGERY    . CHOLECYSTECTOMY    . FEMUR SURGERY  06/2013   Dr.Suarez   . ROTATOR CUFF REPAIR  2003   Per [place] Patient Packet, Dr. Gwenlyn Perking   . TOTAL KNEE ARTHROPLASTY Right 01/05/2019   Procedure: TOTAL KNEE ARTHROPLASTY;  Surgeon: Gaynelle Arabian, MD;  Location: [facility] ORS;  Service: Orthopedics;  Laterality: Right;  68mn   Social History:   reports that she has never smoked. She has never used smokeless tobacco. She reports that she does not drink alcohol and does not use drugs.  Family History  Problem Relation Age of Onset  . Arthritis Mother   . Osteoporosis Mother   . Heart disease Father   . Hearing loss Maternal Grandmother   . Hearing loss Maternal Grandfather   . Cancer Paternal Grandfather   . Cancer Sister     Medications: Patient's Medications  New Prescriptions   No medications on file  Previous Medications   ACETAMINOPHEN (TYLENOL) 500 MG TABLET    Take 500 mg by mouth in the morning and at bedtime. At bedtime.   ALENDRONATE (FOSAMAX) 70 MG TABLET    Take 1 tablet (70 mg total) by mouth once a week. Give with at least 8 ounces of plain water 30 min prior to breakfast.  Patient should be instructed to stay upright (not to lie down) for at least 30 minutes and until after first food of the day (to reduce esophageal irritation). Wednesday   BIMATOPROST (LUMIGAN) 0.01 % SOLN    Place 1 drop into both eyes at bedtime. Can not take Generic   BRIMONIDINE (ALPHAGAN P) 0.1 % SOLN    Place 1 drop into both eyes 2 (two) times daily. Can not take generic   CALCIUM-VITAMIN D PO    Take 600 mg by mouth.   CHOLECALCIFEROL (VITAMIN D) 25 MCG (1000 UNIT) TABLET    Take 1,000 Units by mouth daily.   CHOLESTYRAMINE (QUESTRAN) 4 G PACKET    Take 4 g by mouth  at bedtime.   ESOMEPRAZOLE (NEXIUM) 20 MG CAPSULE    Take 20 mg by mouth daily.    IRON, FERROUS SULFATE, 325 (65 FE) MG TABS    Take 1 tablet by mouth every other day.   LEVOTHYROXINE (SYNTHROID) 75 MCG TABLET    TAKE 1 TABLET(75 MCG) BY MOUTH DAILY   POLYETHYL GLYCOL-PROPYL GLYCOL (SYSTANE ULTRA OP)    Apply 1 drop to eye as needed. Both Eyes. For dry eyes.   VITAMIN B-12 (CYANOCOBALAMIN) 1000 MCG TABLET    Take 1,000 mcg by mouth daily.  Modified Medications   No medications on file  Discontinued Medications   ALBUTEROL (VENTOLIN HFA) 108 (90 BASE) MCG/ACT INHALER    Inhale 1-2 puffs into the lungs every  6 (six) hours as needed for wheezing or shortness of breath.   CALCIUM CITRATE-VITAMIN D (CALCIUM CITRATE + D PO)    Take 1 tablet by mouth 2 (two) times daily.    CHOLECALCIFEROL (VITAMIN D3) 75 MCG (3000 UT) TABS    Take 1 tablet by mouth daily.   CHOLESTYRAMINE (QUESTRAN) 4 G PACKET    MIX AND DRINK 1 PACKET BY MOUTH TWICE DAILY    Physical Exam:  Vitals:   07/27/19 1315  BP: 136/86  Pulse: (!) 57  Resp: 16  Temp: 97.8 F (36.6 C)  SpO2: 93%  Weight: 136 lb 12.8 oz (62.1 kg)  Height: 5' (1.524 m)   Body mass index is 26.72 kg/m. Wt Readings from Last 3 Encounters:  07/27/19 136 lb 12.8 oz (62.1 kg)  06/29/19 132 lb 4.8 oz (60 kg)  06/15/19 140 lb 6.4 oz (63.7 kg)    Physical Exam Constitutional:      General: She is not in acute distress.    Appearance: She is well-developed. She is not diaphoretic.  HENT:     Head: Normocephalic and atraumatic.     Mouth/Throat:     Mouth: Mucous membranes are moist.     Pharynx: Oropharynx is clear. No oropharyngeal exudate.  Eyes:     Conjunctiva/sclera: Conjunctivae normal.     Pupils: Pupils are equal, round, and reactive to light.  Cardiovascular:     Rate and Rhythm: Normal rate and regular rhythm.     Heart sounds: Normal heart sounds.  Pulmonary:     Effort: Pulmonary effort is normal.     Breath sounds: Normal  breath sounds.  Abdominal:     General: Bowel sounds are normal.     Palpations: Abdomen is soft.  Musculoskeletal:        General: No tenderness.     Cervical back: Normal range of motion and neck supple.     Right lower leg: Edema (1+) present.     Left lower leg: Edema (1+) present.  Skin:    General: Skin is warm and dry.  Neurological:     Mental Status: She is alert and oriented to person, place, and time.  Psychiatric:        Mood and Affect: Mood normal.        Behavior: Behavior normal.     Labs reviewed: Basic Metabolic Panel: Recent Labs    09/12/18 1302 11/10/18 1302 12/16/18 1425 06/15/19 1409 06/15/19 1409 06/27/19 2046 06/27/19 2057 06/28/19 0746  NA  --   --    < > 132*   < > 127* 126* 130*  K  --   --    < > 3.6   < > 4.6 4.2 3.5  CL  --   --    < > 100   < > 90* 91* 93*  CO2  --   --    < > 25  --  26  --  29  GLUCOSE  --   --    < > 142*   < > 116* 113* 98  BUN  --   --    < > 17   < > 19 25* 13  CREATININE  --   --    < > 0.83   < > 0.86 0.80 0.70  CALCIUM  --   --    < > 9.2  --  9.0  --  8.8*  MG  --   --   --  1.9  --  2.1  --   --   TSH 5.120* 2.09  --   --   --   --   --   --    < > = values in this interval not displayed.   Liver Function Tests: Recent Labs    12/29/18 1442 06/27/19 2046  AST 25 33  ALT 22 10  ALKPHOS 38 42  BILITOT 0.6 0.8  PROT 6.9 6.4*  ALBUMIN 3.8 3.5   No results for input(s): LIPASE, AMYLASE in the last 8760 hours. No results for input(s): AMMONIA in the last 8760 hours. CBC: Recent Labs    12/29/18 1442 01/06/19 0241 05/29/19 1552 05/29/19 1552 06/27/19 2046 06/27/19 2057 06/28/19 0746  WBC 6.0   < > 6.3  --  11.2*  --  7.0  NEUTROABS 3.8  --   --   --  9.0*  --   --   HGB 12.4   < > 12.8   < > 13.2 14.3 11.9*  HCT 39.4   < > 39.7   < > 40.3 42.0 36.3  MCV 93.4   < > 88.6  --  89.4  --  88.3  PLT 334   < > 320  --  429*  --  398   < > = values in this interval not displayed.   Lipid Panel: No  results for input(s): CHOL, HDL, LDLCALC, TRIG, CHOLHDL, LDLDIRECT in the last 8760 hours. TSH: Recent Labs    09/12/18 1302 11/10/18 1302  TSH 5.120* 2.09   A1C: No results found for: HGBA1C   Assessment/Plan 1. Post-nasal drip -to add Claritin or zyrtec 10 mg daily otc - to see if this helps with cough and sounds of wheezing/congestion in chest.   2. Syncope and collapse No further episodes. Encouraged to maintain proper nutrition and hydration.  3. 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  4. Primary osteoarthritis of right knee -s/p knee replacement. Doing well.   5. Vitamin D deficiency Continues on vit d  6. Seasonal allergies See number 1  7. Hyponatremia -to use caution with diuretics, will follow up - BMP with eGFR(Quest)  8. History of endometrial cancer Continues with CA125 blood test yearly in November and chest xay was updated last month  9. Iron deficiency Continues with iron every other day. - CBC with Differential/Platelet - Iron, TIBC and Ferritin Panel  10. LE edema -discussed dietary modifications, low sodium increase in protein, elevation of LE when sitting and laying down in the afternoon to promote blood flow back to the heart. Compression hose daily   Next appt: 1 month for AWV and 3 month for follow up [facility]. [place], [facility] [phone]

## 2019-07-28 ENCOUNTER — Other Ambulatory Visit: Payer: Self-pay | Admitting: Family Medicine

## 2019-07-28 DIAGNOSIS — M810 Age-related osteoporosis without current pathological fracture: Secondary | ICD-10-CM

## 2019-07-28 LAB — CBC WITH DIFFERENTIAL/PLATELET
Absolute Monocytes: 703 {cells}/uL (ref 200–950)
Basophils Absolute: 28 {cells}/uL (ref 0–200)
Basophils Relative: 0.4 %
Eosinophils Absolute: 121 {cells}/uL (ref 15–500)
Eosinophils Relative: 1.7 %
HCT: 39.7 % (ref 35.0–45.0)
Hemoglobin: 12.7 g/dL (ref 11.7–15.5)
Lymphs Abs: 1037 {cells}/uL (ref 850–3900)
MCH: 29 pg (ref 27.0–33.0)
MCHC: 32 g/dL (ref 32.0–36.0)
MCV: 90.6 fL (ref 80.0–100.0)
MPV: 9.1 fL (ref 7.5–12.5)
Monocytes Relative: 9.9 %
Neutro Abs: 5211 {cells}/uL (ref 1500–7800)
Neutrophils Relative %: 73.4 %
Platelets: 477 10*3/uL — ABNORMAL HIGH (ref 140–400)
RBC: 4.38 Million/uL (ref 3.80–5.10)
RDW: 12.8 % (ref 11.0–15.0)
Total Lymphocyte: 14.6 %
WBC: 7.1 10*3/uL (ref 3.8–10.8)

## 2019-07-28 LAB — IRON,TIBC AND FERRITIN PANEL
%SAT: 13 % — ABNORMAL LOW (ref 16–45)
Ferritin: 62 ng/mL (ref 16–288)
Iron: 54 ug/dL (ref 45–160)
TIBC: 429 ug/dL (ref 250–450)

## 2019-07-28 LAB — BASIC METABOLIC PANEL WITH GFR
BUN: 17 mg/dL (ref 7–25)
CO2: 26 mmol/L (ref 20–32)
Calcium: 10.3 mg/dL (ref 8.6–10.4)
Chloride: 97 mmol/L — ABNORMAL LOW (ref 98–110)
Creat: 0.87 mg/dL (ref 0.60–0.88)
GFR, Est African American: 73 mL/min/{1.73_m2} (ref >=60)
GFR, Est Non African American: 63 mL/min/{1.73_m2} (ref >=60)
Glucose, Bld: 103 mg/dL (ref 65–139)
Potassium: 4.2 mmol/L (ref 3.5–5.3)
Sodium: 133 mmol/L — ABNORMAL LOW (ref 135–146)

## 2019-07-30 DIAGNOSIS — D649 Anemia, unspecified: Secondary | ICD-10-CM | POA: Diagnosis not present

## 2019-07-30 DIAGNOSIS — I10 Essential (primary) hypertension: Secondary | ICD-10-CM | POA: Diagnosis not present

## 2019-07-30 DIAGNOSIS — M6281 Muscle weakness (generalized): Secondary | ICD-10-CM | POA: Diagnosis not present

## 2019-07-30 DIAGNOSIS — R2681 Unsteadiness on feet: Secondary | ICD-10-CM | POA: Diagnosis not present

## 2019-07-30 DIAGNOSIS — Z96642 Presence of left artificial hip joint: Secondary | ICD-10-CM | POA: Diagnosis not present

## 2019-07-30 DIAGNOSIS — E559 Vitamin D deficiency, unspecified: Secondary | ICD-10-CM | POA: Diagnosis not present

## 2019-07-30 DIAGNOSIS — R55 Syncope and collapse: Secondary | ICD-10-CM | POA: Diagnosis not present

## 2019-07-30 DIAGNOSIS — E871 Hypo-osmolality and hyponatremia: Secondary | ICD-10-CM | POA: Diagnosis not present

## 2019-07-30 DIAGNOSIS — M810 Age-related osteoporosis without current pathological fracture: Secondary | ICD-10-CM | POA: Diagnosis not present

## 2019-07-30 DIAGNOSIS — G629 Polyneuropathy, unspecified: Secondary | ICD-10-CM | POA: Diagnosis not present

## 2019-08-04 DIAGNOSIS — R55 Syncope and collapse: Secondary | ICD-10-CM | POA: Diagnosis not present

## 2019-08-04 DIAGNOSIS — D649 Anemia, unspecified: Secondary | ICD-10-CM | POA: Diagnosis not present

## 2019-08-04 DIAGNOSIS — R2681 Unsteadiness on feet: Secondary | ICD-10-CM | POA: Diagnosis not present

## 2019-08-04 DIAGNOSIS — I10 Essential (primary) hypertension: Secondary | ICD-10-CM | POA: Diagnosis not present

## 2019-08-04 DIAGNOSIS — E871 Hypo-osmolality and hyponatremia: Secondary | ICD-10-CM | POA: Diagnosis not present

## 2019-08-04 DIAGNOSIS — G629 Polyneuropathy, unspecified: Secondary | ICD-10-CM | POA: Diagnosis not present

## 2019-08-11 DIAGNOSIS — I10 Essential (primary) hypertension: Secondary | ICD-10-CM | POA: Diagnosis not present

## 2019-08-11 DIAGNOSIS — E871 Hypo-osmolality and hyponatremia: Secondary | ICD-10-CM | POA: Diagnosis not present

## 2019-08-11 DIAGNOSIS — G629 Polyneuropathy, unspecified: Secondary | ICD-10-CM | POA: Diagnosis not present

## 2019-08-11 DIAGNOSIS — D649 Anemia, unspecified: Secondary | ICD-10-CM | POA: Diagnosis not present

## 2019-08-11 DIAGNOSIS — R2681 Unsteadiness on feet: Secondary | ICD-10-CM | POA: Diagnosis not present

## 2019-08-11 DIAGNOSIS — R55 Syncope and collapse: Secondary | ICD-10-CM | POA: Diagnosis not present

## 2019-08-17 ENCOUNTER — Ambulatory Visit: Payer: Medicare Other | Admitting: Family Medicine

## 2019-08-17 DIAGNOSIS — R2681 Unsteadiness on feet: Secondary | ICD-10-CM | POA: Diagnosis not present

## 2019-08-17 DIAGNOSIS — D649 Anemia, unspecified: Secondary | ICD-10-CM | POA: Diagnosis not present

## 2019-08-17 DIAGNOSIS — I10 Essential (primary) hypertension: Secondary | ICD-10-CM | POA: Diagnosis not present

## 2019-08-17 DIAGNOSIS — E871 Hypo-osmolality and hyponatremia: Secondary | ICD-10-CM | POA: Diagnosis not present

## 2019-08-17 DIAGNOSIS — G629 Polyneuropathy, unspecified: Secondary | ICD-10-CM | POA: Diagnosis not present

## 2019-08-17 DIAGNOSIS — R55 Syncope and collapse: Secondary | ICD-10-CM | POA: Diagnosis not present

## 2019-08-27 ENCOUNTER — Other Ambulatory Visit: Payer: Self-pay

## 2019-08-27 ENCOUNTER — Encounter: Payer: Self-pay | Admitting: Nurse Practitioner

## 2019-08-27 ENCOUNTER — Ambulatory Visit (INDEPENDENT_AMBULATORY_CARE_PROVIDER_SITE_OTHER): Payer: Medicare Other | Admitting: Nurse Practitioner

## 2019-08-27 DIAGNOSIS — Z0000 Encounter for general adult medical examination without abnormal findings: Secondary | ICD-10-CM

## 2019-08-27 NOTE — Progress Notes (Signed)
This service is provided via telemedicine  No vital signs collected/recorded due to the encounter was a telemedicine visit.   Location of patient (ex: home, work):  Home  Patient consents to a telephone visit:  Yes, see telephone consent dated 08/27/2019  Location of the provider (ex: office, home):  [place]  Name of any referring provider:  N/A  Names of all persons participating in the telemedicine service and their role in the encounter:  Sherrie Mustache, Nurse Practitioner, Carroll Kinds, CMA, and patient.   Time spent on call:  8 minutes with medical assitant

## 2019-08-27 NOTE — Patient Instructions (Addendum)
Katie [name] , Thank you for taking time to come for your Medicare Wellness Visit. I appreciate your ongoing commitment to your health goals. Please review the following plan we discussed and let me know if I can assist you in the future.   Screening recommendations/referrals: Colonoscopy aged out Mammogram aged out Bone Density up to date Recommended yearly ophthalmology/optometry visit for glaucoma screening and checkup Recommended yearly dental visit for hygiene and checkup  Vaccinations: Influenza vaccine up to date Pneumococcal vaccine up to date Tdap vaccine up to date Shingles vaccine TO CONFIRM IF YOU HAVE TAKEN Shingrix vaccine     Advanced directives: recommend placing MOST form on file.   Conditions/risks identified: advanced age  Next appointment: 1 year    Preventive Care 81 Years and Older, Female Preventive care refers to lifestyle choices and visits with your health care provider that can promote health and wellness. What does preventive care include?  A yearly physical exam. This is also called an annual well check.  Dental exams once or twice a year.  Routine eye exams. Ask your health care provider how often you should have your eyes checked.  Personal lifestyle choices, including:  Daily care of your teeth and gums.  Regular physical activity.  Eating a healthy diet.  Avoiding tobacco and drug use.  Limiting alcohol use.  Practicing safe sex.  Taking low-dose aspirin every day.  Taking vitamin and mineral supplements as recommended by your health care provider. What happens during an annual well check? The services and screenings done by your health care provider during your annual well check will depend on your age, overall health, lifestyle risk factors, and family history of disease. Counseling  Your health care provider may ask you questions about your:  Alcohol use.  Tobacco use.  Drug use.  Emotional well-being.  Home and  relationship well-being.  Sexual activity.  Eating habits.  History of falls.  Memory and ability to understand (cognition).  Work and work Statistician.  Reproductive health. Screening  You may have the following tests or measurements:  Height, weight, and BMI.  Blood pressure.  Lipid and cholesterol levels. These may be checked every 5 years, or more frequently if you are over 81 years old.  Skin check.  Lung cancer screening. You may have this screening every year starting at age 81 if you have a 30-pack-year history of smoking and currently smoke or have quit within the past 15 years.  Fecal occult blood test (FOBT) of the stool. You may have this test every year starting at age 81.  Flexible sigmoidoscopy or colonoscopy. You may have a sigmoidoscopy every 5 years or a colonoscopy every 10 years starting at age 81.  Hepatitis C blood test.  Hepatitis B blood test.  Sexually transmitted disease (STD) testing.  Diabetes screening. This is done by checking your blood sugar (glucose) after you have not eaten for a while (fasting). You may have this done every 1-3 years.  Bone density scan. This is done to screen for osteoporosis. You may have this done starting at age 81.  Mammogram. This may be done every 1-2 years. Talk to your health care provider about how often you should have regular mammograms. Talk with your health care provider about your test results, treatment options, and if necessary, the need for more tests. Vaccines  Your health care provider may recommend certain vaccines, such as:  Influenza vaccine. This is recommended every year.  Tetanus, diphtheria, and acellular pertussis (Tdap, Td)  vaccine. You may need a Td booster every 10 years.  Zoster vaccine. You may need this after age 81.  Pneumococcal 13-valent conjugate (PCV13) vaccine. One dose is recommended after age 1.  Pneumococcal polysaccharide (PPSV23) vaccine. One dose is recommended after  age 29. Talk to your health care provider about which screenings and vaccines you need and how often you need them. This information is not intended to replace advice given to you by your health care provider. Make sure you discuss any questions you have with your health care provider. Document Released: 02/18/2015 Document Revised: 10/12/2015 Document Reviewed: 11/23/2014 Elsevier Interactive Patient Education  2017 [place] Prevention in the Home Falls can cause injuries. They can happen to people of all ages. There are many things you can do to make your home safe and to help prevent falls. What can I do on the outside of my home?  Regularly fix the edges of walkways and driveways and fix any cracks.  Remove anything that might make you trip as you walk through a door, such as a raised step or threshold.  Trim any bushes or trees on the path to your home.  Use bright outdoor lighting.  Clear any walking paths of anything that might make someone trip, such as rocks or tools.  Regularly check to see if handrails are loose or broken. Make sure that both sides of any steps have handrails.  Any raised decks and porches should have guardrails on the edges.  Have any leaves, snow, or ice cleared regularly.  Use sand or salt on walking paths during winter.  Clean up any spills in your garage right away. This includes oil or grease spills. What can I do in the bathroom?  Use night lights.  Install grab bars by the toilet and in the tub and shower. Do not use towel bars as grab bars.  Use non-skid mats or decals in the tub or shower.  If you need to sit down in the shower, use a plastic, non-slip stool.  Keep the floor dry. Clean up any water that spills on the floor as soon as it happens.  Remove soap buildup in the tub or shower regularly.  Attach bath mats securely with double-sided non-slip rug tape.  Do not have throw rugs and other things on the floor that can  make you trip. What can I do in the bedroom?  Use night lights.  Make sure that you have a light by your bed that is easy to reach.  Do not use any sheets or blankets that are too big for your bed. They should not hang down onto the floor.  Have a firm chair that has side arms. You can use this for support while you get dressed.  Do not have throw rugs and other things on the floor that can make you trip. What can I do in the kitchen?  Clean up any spills right away.  Avoid walking on wet floors.  Keep items that you use a lot in easy-to-reach places.  If you need to reach something above you, use a strong step stool that has a grab bar.  Keep electrical cords out of the way.  Do not use floor polish or wax that makes floors slippery. If you must use wax, use non-skid floor wax.  Do not have throw rugs and other things on the floor that can make you trip. What can I do with my stairs?  Do not leave  any items on the stairs.  Make sure that there are handrails on both sides of the stairs and use them. Fix handrails that are broken or loose. Make sure that handrails are as long as the stairways.  Check any carpeting to make sure that it is firmly attached to the stairs. Fix any carpet that is loose or worn.  Avoid having throw rugs at the top or bottom of the stairs. If you do have throw rugs, attach them to the floor with carpet tape.  Make sure that you have a light switch at the top of the stairs and the bottom of the stairs. If you do not have them, ask someone to add them for you. What else can I do to help prevent falls?  Wear shoes that:  Do not have high heels.  Have rubber bottoms.  Are comfortable and fit you well.  Are closed at the toe. Do not wear sandals.  If you use a stepladder:  Make sure that it is fully opened. Do not climb a closed stepladder.  Make sure that both sides of the stepladder are locked into place.  Ask someone to hold it for you,  if possible.  Clearly mark and make sure that you can see:  Any grab bars or handrails.  First and last steps.  Where the edge of each step is.  Use tools that help you move around (mobility aids) if they are needed. These include:  Canes.  Walkers.  Scooters.  Crutches.  Turn on the lights when you go into a dark area. Replace any light bulbs as soon as they burn out.  Set up your furniture so you have a clear path. Avoid moving your furniture around.  If any of your floors are uneven, fix them.  If there are any pets around you, be aware of where they are.  Review your medicines with your doctor. Some medicines can make you feel dizzy. This can increase your chance of falling. Ask your doctor what other things that you can do to help prevent falls. This information is not intended to replace advice given to you by your health care provider. Make sure you discuss any questions you have with your health care provider. Document Released: 11/18/2008 Document Revised: 06/30/2015 Document Reviewed: 02/26/2014 Elsevier Interactive Patient Education  2017 [employer].

## 2019-08-27 NOTE — Progress Notes (Signed)
Subjective:   Katie [name] is a 79 y.o. female who presents for Medicare Annual (Subsequent) preventive examination.  Review of Systems     Cardiac Risk Factors include: advanced age (>40men, >107 women)     Objective:    There were no vitals filed for this visit. There is no height or weight on file to calculate BMI.  Advanced Directives 08/27/2019 07/27/2019 03/23/2019 01/05/2019 12/29/2018 05/09/2017  Does Patient Have a Medical Advance Directive? Yes Yes Yes Yes Yes No  Type of Advance Directive Living will Living will;Healthcare Power of [place];Living will [place];Living will -  Does patient want to make changes to medical advance directive? No - Patient declined No - Patient declined - No - Patient declined No - Patient declined -  Copy of [place] in Chart? - Yes - validated most recent copy scanned in chart (See row information) - Yes - validated most recent copy scanned in chart (See row information) Yes - validated most recent copy scanned in chart (See row information) -    Current Medications (verified) Outpatient Encounter Medications as of 08/27/2019  Medication Sig  . alendronate (FOSAMAX) 70 MG tablet TAKE 1 TABLET BY MOUTH ONCE A WEEK, WITH WATER. DO NOT LIE DOWN FOR AT LEAST 30 MINUTES AND UNTIL AFTER FIRST MEAL OF THE DAY  . bimatoprost (LUMIGAN) 0.01 % SOLN Place 1 drop into both eyes at bedtime. Can not take Generic  . brimonidine (ALPHAGAN P) 0.1 % SOLN Place 1 drop into both eyes 2 (two) times daily. Can not take generic  . CALCIUM-VITAMIN D PO Take 600 mg by mouth.  . cholecalciferol (VITAMIN D) 25 MCG (1000 UNIT) tablet Take 3,000 Units by mouth daily.   . cholestyramine (QUESTRAN) 4 g packet Take 4 g by mouth at bedtime.  Marland Kitchen esomeprazole (NEXIUM) 20 MG capsule Take 20 mg by mouth daily.   . Iron, Ferrous Sulfate, 325 (65 Fe) MG TABS Take 1 tablet by mouth every other day.  .  levothyroxine (SYNTHROID) 75 MCG tablet TAKE 1 TABLET(75 MCG) BY MOUTH DAILY  . Polyethyl Glycol-Propyl Glycol (SYSTANE ULTRA OP) Apply 1 drop to eye as needed. Both Eyes. For dry eyes.  . vitamin B-12 (CYANOCOBALAMIN) 1000 MCG tablet Take 1,000 mcg by mouth daily.  Marland Kitchen acetaminophen (TYLENOL) 500 MG tablet Take 500 mg by mouth daily. At bedtime.    No facility-administered encounter medications on file as of 08/27/2019.    Allergies (verified) Sulfa antibiotics, Gabapentin, Lyrica [pregabalin], and Paxil [paroxetine]   History: Past Medical History:  Diagnosis Date  . Arthritis   . Cancer [facility])    Endometrial  . Complication of anesthesia    very hard to wake up from Anesthesia- then when wakes up feels like she is going to faint or have vomiting  . Endometrial cancer [facility])    Per [facility] New Patient Packet   . Family history of adverse reaction to anesthesia    daughter has a hard time waking up from Anesthesia  . GERD (gastroesophageal reflux disease)   . Glaucoma   . History of fainting 2021   Per [place] Patient Packet   . Hypothyroidism   . 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 PONV (postoperative nausea and vomiting)   . Thyroid disease    Past Surgical History:  Procedure Laterality Date  . ABDOMINAL HYSTERECTOMY    .  APPENDECTOMY  1958  . BREAST EXCISIONAL BIOPSY Right    benign, was a cyst  . BREAST SURGERY Left    lumpectomy in 1989  . CATARACT EXTRACTION  04/29/2019  . CHOLECYSTECTOMY    . FEMUR SURGERY  06/2013   Dr.Suarez   . ROTATOR CUFF REPAIR  2003   Per [place] Patient Packet, Dr. Gwenlyn Perking   . TOTAL KNEE ARTHROPLASTY Right 01/05/2019   Procedure: TOTAL KNEE ARTHROPLASTY;  Surgeon: Gaynelle Arabian, MD;  Location: [facility];  Service: Orthopedics;  Laterality: Right;  41min   Family History  Problem Relation Age of Onset  . Arthritis Mother   . Osteoporosis Mother   . Heart disease Father   .  Hearing loss Maternal Grandmother   . Hearing loss Maternal Grandfather   . Cancer Paternal Grandfather   . Cancer Sister    Social History   Socioeconomic History  . Marital status: Widowed    Spouse name: Not on file  . Number of children: Not on file  . Years of education: Not on file  . Highest education level: Not on file  Occupational History  . Occupation: retired  Tobacco Use  . Smoking status: Never Smoker  . Smokeless tobacco: Never Used  Vaping Use  . Vaping Use: Never used  Substance and Sexual Activity  . Alcohol use: No  . Drug use: No  . Sexual activity: Not on file  Other Topics Concern  . Not on file  Social History Narrative   Right handed   One story apartment   One cup coffee daily and tea      Per [place] New Patient Packet Abstracted on 07/13/2019 by Chrae Beatty/CMA      Diet: left blank      Caffeine: Yes      Married, if yes what year: Widowed, married in [place] you live in a house, apartment, assisted living, condo, trailer, ect: Apartment, 1 person      Is it one or more stories: Yes, patient lives on ground floor       Pets: No      Current/Past profession: Optometrist      Highest level or education completed: [employer]       Exercise:     Not enough            Type and how often:          Living Will: Yes   DNR: Yes    POA/HPOA: Yes      Functional Status:   Do you have difficulty bathing or dressing yourself? Left blank   Do you have difficulty preparing food or eating? Left blank   Do you have difficulty managing your medications? Left blank   Do you have difficulty managing your finances? Left blank   Do you have difficulty affording your medications? Left blank      Social Determinants of Health   Financial Resource Strain:   . Difficulty of Paying Living Expenses:   Food Insecurity:   . Worried About Charity fundraiser in the Last Year:   . Arboriculturist in the Last Year:   Transportation Needs:   . Lexicographer (Medical):   Marland Kitchen Lack of Transportation (Non-Medical):   Physical Activity:   . Days of Exercise per Week:   . Minutes of Exercise per Session:   Stress:   . Feeling of Stress :   Social  Connections:   . Frequency of Communication with Friends and Family:   . Frequency of Social Gatherings with Friends and Family:   . Attends Religious Services:   . Active Member of Clubs or Organizations:   . Attends Archivist Meetings:   Marland Kitchen Marital Status:     Tobacco Counseling Counseling given: Not Answered   Clinical Intake:  Pre-visit preparation completed: Yes  Pain : No/denies pain     BMI - recorded: 26 Nutritional Status: BMI 25 -29 Overweight Nutritional Risks: None Diabetes: No  How often do you need to have someone help you when you read instructions, pamphlets, or other written materials from your doctor or pharmacy?: 1 - Never  Diabetic?no         Activities of Daily Living In your present state of health, do you have any difficulty performing the following activities: 08/27/2019 06/28/2019  Hearing? Y N  Comment wears hearing aides -  Vision? N N  Difficulty concentrating or making decisions? N N  Walking or climbing stairs? Y Y  Comment avoids stairs -  Dressing or bathing? N N  Doing errands, shopping? N N  Preparing Food and eating ? N -  Using the Toilet? N -  In the past six months, have you accidently leaked urine? Y -  Do you have problems with loss of bowel control? N -  Managing your Medications? N -  Managing your Finances? N -  Housekeeping or managing your Housekeeping? N -  Some recent data might be hidden    Patient Care Team: [name], NP as PCP - General (Geriatric Medicine) Alda Berthold, DO as Consulting Physician (Neurology) Gaynelle Arabian, MD as Consulting Physician (Orthopedic Surgery) Felipa Furnace, DPM as Consulting Physician (Podiatry) Renne Crigler, MD as Referring Physician (Obstetrics  and Gynecology) Jola Schmidt, MD as Consulting Physician (Ophthalmology)  Indicate any recent Medical Services you may have received from other than [facility] providers in the past year (date may be approximate).     Assessment:   This is a routine wellness examination for [name].  Hearing/Vision screen  Hearing Screening   125Hz  250Hz  500Hz  1000Hz  2000Hz  3000Hz  4000Hz  6000Hz  8000Hz   Right ear:           Left ear:           Comments: Patient wears hearing aids  Vision Screening Comments: Patient has cataracts. Patient has had an eye exam within the past year.  Dietary issues and exercise activities discussed: Current Exercise Habits: The patient does not participate in regular exercise at present  Goals   None    Depression Screen PHQ 2/9 Scores 08/27/2019 06/15/2019 12/16/2018 09/09/2017  PHQ - 2 Score 0 0 0 0  PHQ- 9 Score - 2 - -    Fall Risk Fall Risk  08/27/2019 07/27/2019 03/23/2019 12/31/2018 12/16/2018  Falls in the past year? 0 1 0 0 0  Comment - - - Emmi Telephone Survey: data to providers prior to load -  Number falls in past yr: 0 0 0 - -  Injury with Fall? 1 0 0 - -  Follow up - - - - Falls evaluation completed    Any stairs in or around the home? No  If so, are there any without handrails? No  Home free of loose throw rugs in walkways, pet beds, electrical cords, etc? No  Adequate lighting in your home to reduce risk of falls? Yes   ASSISTIVE DEVICES UTILIZED TO PREVENT  FALLS:  Life alert? Yes  Use of a cane, walker or w/c? Yes  Grab bars in the bathroom? No  Shower chair or bench in shower? Yes  Elevated toilet seat or a handicapped toilet? No   TIMED UP AND GO: na   6CIT Screen 08/27/2019  What Year? 0 points  What month? 0 points  What time? 0 points  Count back from 20 0 points  Months in reverse 0 points  Repeat phrase 0 points  Total Score 0    Immunizations Immunization History  Administered Date(s) Administered  . Fluad Quad(high Dose  65+) 10/08/2018  . Influenza-Unspecified 09/28/2016, 10/16/2017  . PFIZER SARS-COV-2 Vaccination 02/25/2019, 03/17/2019  . Pneumococcal Conjugate-13 01/08/2011  . Pneumococcal Polysaccharide-23 12/16/2018  . Tdap 06/20/2016    TDAP status: Up to date Flu Vaccine status: Up to date Pneumococcal vaccine status: Up to date Covid-19 vaccine status: Completed vaccines  Qualifies for Shingles Vaccine? Yes   Zostavax completed Yes   Shingrix Completed?: Yes  Screening Tests Health Maintenance  Topic Date Due  . INFLUENZA VACCINE  09/06/2019  . TETANUS/TDAP  06/21/2026  . DEXA SCAN  Completed  . COVID-19 Vaccine  Completed  . PNA vac Low Risk Adult  Completed    Health Maintenance  There are no preventive care reminders to display for this patient.  Colorectal cancer screening: No longer required.  Mammogram status: No longer required.  Bone Density status: Completed 03/2018. Results reflect: Bone density results: OSTEOPOROSIS. Repeat every 2 years.  Lung Cancer Screening: (Low Dose CT Chest recommended if Age 64-80 years, 30 pack-year currently smoking OR have quit w/in 15years.) does not qualify.   Lung Cancer Screening Referral: na  Additional Screening:  Hepatitis C Screening: does not qualify  Vision Screening: Recommended annual ophthalmology exams for early detection of glaucoma and other disorders of the eye. Is the patient up to date with their annual eye exam?  Yes  Who is the provider or what is the name of the office in which the patient attends annual eye exams? [facility] If pt is not established with a provider, would they like to be referred to a provider to establish care? No .   Dental Screening: Recommended annual dental exams for proper oral hygiene  Community Resource Referral / Chronic Care Management: CRR required this visit?  No   CCM required this visit?  No      Plan:     I have personally reviewed and noted the following in the patient's chart:    . Medical and social history . Use of alcohol, tobacco or illicit drugs  . Current medications and supplements . Functional ability and status . Nutritional status . Physical activity . Advanced directives . List of other physicians . Hospitalizations, surgeries, and ER visits in previous 12 months . Vitals . Screenings to include cognitive, depression, and falls . Referrals and appointments  In addition, I have reviewed and discussed with patient certain preventive protocols, quality metrics, and best practice recommendations. A written personalized care plan for preventive services as well as general preventive health recommendations were provided to patient.     [name], NP   08/27/2019

## 2019-08-28 ENCOUNTER — Encounter: Payer: Self-pay | Admitting: Family

## 2019-08-28 ENCOUNTER — Ambulatory Visit (INDEPENDENT_AMBULATORY_CARE_PROVIDER_SITE_OTHER): Payer: Medicare Other | Admitting: Family

## 2019-08-28 ENCOUNTER — Other Ambulatory Visit: Payer: Self-pay

## 2019-08-28 VITALS — BP 118/70 | HR 99 | Temp 98.0°F | Resp 16 | Ht 60.0 in | Wt 139.4 lb

## 2019-08-28 DIAGNOSIS — R05 Cough: Secondary | ICD-10-CM

## 2019-08-28 DIAGNOSIS — R0982 Postnasal drip: Secondary | ICD-10-CM

## 2019-08-28 DIAGNOSIS — R059 Cough, unspecified: Secondary | ICD-10-CM

## 2019-08-28 DIAGNOSIS — E611 Iron deficiency: Secondary | ICD-10-CM

## 2019-08-28 MED ORDER — IRON (FERROUS SULFATE) 325 (65 FE) MG PO TABS: 1.0000 | ORAL_TABLET | ORAL | 1 refills | Status: DC

## 2019-08-28 MED ORDER — FLUTICASONE PROPIONATE 50 MCG/ACT NA SUSP: 2.0000 | Freq: Every day | NASAL | 6 refills | Status: DC

## 2019-08-28 NOTE — Patient Instructions (Signed)
-  Referral to Pulmonary ordered today specialist will call you.

## 2019-08-28 NOTE — Progress Notes (Signed)
Provider: Shailyn Weyandt FNP-C  [name], NP  Patient Care Team: [name], NP as PCP - General (Geriatric Medicine) Alda Berthold, DO as Consulting Physician (Neurology) Gaynelle Arabian, MD as Consulting Physician (Orthopedic Surgery) Felipa Furnace, DPM as Consulting Physician (Podiatry) Renne Crigler, MD as Referring Physician (Obstetrics and Gynecology) Jola Schmidt, MD as Consulting Physician (Ophthalmology)  Extended Emergency Contact Information Primary Emergency Contact: Janifer Adie Mobile Phone: [phone] Relation: Daughter  Code Status: Full Code  Goals of care: Advanced Directive information Advanced Directives 08/28/2019  Does Patient Have a Medical Advance Directive? Yes  Type of Paramedic of [place];Living will  Does patient want to make changes to medical advance directive? No - Patient declined  Copy of [place] in Chart? Yes - validated most recent copy scanned in chart (See row information)     Chief Complaint  Patient presents with  . Acute Visit    Cough since February.    HPI:  Pt is a 79 y.o. female seen today for an acute visit for evaluation of cough since February.she states had COVID-19 vaccine in february few weeks later started coughing.states was prescribed Albuterol inhaler but unable to use inhaler " I can't  Inhale".Has tried spraying in the mouth but cannot inhale.cough is described as non-productive " Just on trachea".cough " chokes " her has to drink water.Cough does not wake her up at night.She denies any orthopnea,chest tightness,chest pain or shortness of breath.she sleeps on one pillow at night.   Also has nasal drainage running at the back of her throat.takes over the counter Claritin daily without any relief.Has had drainage since she had chemotherapy in 2010 and radiation in 2014 for cervical cancer.   She denies any fever,chills or sinus congestion.  She takes  OTC Nexium 20 mg daily states used to take 40 mg but wean her self down States her symptoms are under control.    Past Medical History:  Diagnosis Date  . Arthritis   . Cancer [facility])    Endometrial  . Complication of anesthesia    very hard to wake up from Anesthesia- then when wakes up feels like she is going to faint or have vomiting  . Endometrial cancer [facility])    Per [facility] New Patient Packet   . Family history of adverse reaction to anesthesia    daughter has a hard time waking up from Anesthesia  . GERD (gastroesophageal reflux disease)   . Glaucoma   . History of fainting 2021   Per [place] Patient Packet   . Hypothyroidism   . 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 PONV (postoperative nausea and vomiting)   . Thyroid disease    Past Surgical History:  Procedure Laterality Date  . ABDOMINAL HYSTERECTOMY    . APPENDECTOMY  1958  . BREAST EXCISIONAL BIOPSY Right    benign, was a cyst  . BREAST SURGERY Left    lumpectomy in 1989  . CATARACT EXTRACTION  04/29/2019  . CHOLECYSTECTOMY    . FEMUR SURGERY  06/2013   Dr.Suarez   . ROTATOR CUFF REPAIR  2003   Per [place] Patient Packet, Dr. Gwenlyn Perking   . TOTAL KNEE ARTHROPLASTY Right 01/05/2019   Procedure: TOTAL KNEE ARTHROPLASTY;  Surgeon: Gaynelle Arabian, MD;  [facility];  Service: Orthopedics;  Laterality: Right;  68min    Allergies  Allergen Reactions  . Sulfa  Antibiotics Rash  . Gabapentin Other (See Comments)    Depressed   . Lyrica [Pregabalin] Other (See Comments)    unknown  . Paxil [Paroxetine]     unknown    Outpatient Encounter Medications as of 08/28/2019  Medication Sig  . acetaminophen (TYLENOL) 500 MG tablet Take 500 mg by mouth daily. At bedtime.   Marland Kitchen alendronate (FOSAMAX) 70 MG tablet TAKE 1 TABLET BY MOUTH ONCE A WEEK, WITH WATER. DO NOT LIE DOWN FOR AT LEAST 30 MINUTES AND UNTIL AFTER FIRST MEAL OF THE DAY  . bimatoprost (LUMIGAN)  0.01 % SOLN Place 1 drop into both eyes at bedtime. Can not take Generic  . brimonidine (ALPHAGAN P) 0.1 % SOLN Place 1 drop into both eyes 2 (two) times daily. Can not take generic  . CALCIUM-VITAMIN D PO Take 600 mg by mouth.  . cholecalciferol (VITAMIN D) 25 MCG (1000 UNIT) tablet Take 3,000 Units by mouth daily.   . cholestyramine (QUESTRAN) 4 g packet Take 4 g by mouth at bedtime.  Marland Kitchen esomeprazole (NEXIUM) 20 MG capsule Take 20 mg by mouth daily.   . Iron, Ferrous Sulfate, 325 (65 Fe) MG TABS Take 1 tablet by mouth every other day.  . levothyroxine (SYNTHROID) 75 MCG tablet TAKE 1 TABLET(75 MCG) BY MOUTH DAILY  . Polyethyl Glycol-Propyl Glycol (SYSTANE ULTRA OP) Apply 1 drop to eye as needed. Both Eyes. For dry eyes.  . vitamin B-12 (CYANOCOBALAMIN) 1000 MCG tablet Take 1,000 mcg by mouth daily.   No facility-administered encounter medications on file as of 08/28/2019.    Review of Systems  Constitutional: Negative for appetite change, chills, fatigue and fever.  HENT: Positive for postnasal drip and rhinorrhea. Negative for congestion, sinus pressure, sinus pain, sneezing and sore throat.   Respiratory: Positive for cough. Negative for chest tightness, shortness of breath and wheezing.   Cardiovascular: Negative for chest pain, palpitations and leg swelling.  Gastrointestinal: Negative for abdominal distention, abdominal pain, constipation, diarrhea, nausea and vomiting.       Nexium effective     Immunization History  Administered Date(s) Administered  . Fluad Quad(high Dose 65+) 10/08/2018  . Influenza-Unspecified 09/28/2016, 10/16/2017  . PFIZER SARS-COV-2 Vaccination 02/25/2019, 03/17/2019  . Pneumococcal Conjugate-13 01/08/2011  . Pneumococcal Polysaccharide-23 12/16/2018  . Tdap 06/20/2016   Pertinent  Health Maintenance Due  Topic Date Due  . INFLUENZA VACCINE  09/06/2019  . DEXA SCAN  Completed  . PNA vac Low Risk Adult  Completed   Fall Risk  08/28/2019 08/27/2019  07/27/2019 03/23/2019 12/31/2018  Falls in the past year? 1 0 1 0 0  Comment - - - - Emmi Telephone Survey: data to providers prior to load  Number falls in past yr: 0 0 0 0 -  Injury with Fall? 0 1 0 0 -  Follow up - - - - -    Vitals:   08/28/19 1429  BP: 118/70  Pulse: 99  Resp: 16  Temp: 98 F (36.7 C)  SpO2: 94%  Weight: 139 lb 6.4 oz (63.2 kg)  Height: 5' (1.524 m)   Body mass index is 27.22 kg/m. Physical Exam Vitals reviewed.  Constitutional:      General: She is not in acute distress.    Appearance: She is overweight. She is not ill-appearing.  HENT:     Head: Normocephalic.     Right Ear: Tympanic membrane, ear canal and external ear normal. There is no impacted cerumen.     Left Ear:  Tympanic membrane, ear canal and external ear normal. There is no impacted cerumen.     Ears:     Comments: HOH bilateral hearing aids in place     Nose: Rhinorrhea present. No congestion.     Mouth/Throat:     Mouth: Mucous membranes are moist.     Pharynx: Oropharynx is clear. No posterior oropharyngeal erythema.     Comments: Post nasal drainage  Eyes:     General: No scleral icterus.       Right eye: No discharge.        Left eye: No discharge.     Conjunctiva/sclera: Conjunctivae normal.     Pupils: Pupils are equal, round, and reactive to light.  Cardiovascular:     Rate and Rhythm: Normal rate and regular rhythm.     Pulses: Normal pulses.     Heart sounds: Normal heart sounds. No murmur heard.  No friction rub. No gallop.   Pulmonary:     Effort: Pulmonary effort is normal. No respiratory distress.     Breath sounds: No wheezing, rhonchi or rales.     Comments: Diminished Trachea stridor like  breath sounds noted Chest:     Chest wall: No tenderness.  Abdominal:     General: Bowel sounds are normal. There is no distension.     Palpations: Abdomen is soft. There is no mass.     Tenderness: There is no abdominal tenderness. There is no right CVA tenderness, left CVA  tenderness, guarding or rebound.  Musculoskeletal:        General: No tenderness.     Right lower leg: No edema.     Left lower leg: No edema.     Comments: Ambulates with a walker.  Skin:    General: Skin is warm and dry.     Coloration: Skin is not pale.     Findings: No bruising or erythema.  Neurological:     Mental Status: She is alert and oriented to person, place, and time.     Cranial Nerves: No cranial nerve deficit.     Motor: No weakness.     Gait: Gait abnormal.  Psychiatric:        Mood and Affect: Mood normal.        Behavior: Behavior normal.        Thought Content: Thought content normal.        Judgment: Judgment normal.    Labs reviewed: Recent Labs    06/15/19 1409 06/15/19 1409 06/27/19 2046 06/27/19 2046 06/27/19 2057 06/28/19 0746 07/27/19 1422  NA 132*   < > 127*   < > 126* 130* 133*  K 3.6   < > 4.6   < > 4.2 3.5 4.2  CL 100   < > 90*   < > 91* 93* 97*  CO2 25   < > 26  --   --  29 26  GLUCOSE 142*   < > 116*   < > 113* 98 103  BUN 17   < > 19   < > 25* 13 17  CREATININE 0.83   < > 0.86   < > 0.80 0.70 0.87  CALCIUM 9.2   < > 9.0  --   --  8.8* 10.3  MG 1.9  --  2.1  --   --   --   --    < > = values in this interval not displayed.   Recent Labs    12/29/18  1442 06/27/19 2046  AST 25 33  ALT 22 10  ALKPHOS 38 42  BILITOT 0.6 0.8  PROT 6.9 6.4*  ALBUMIN 3.8 3.5   Recent Labs    12/29/18 1442 01/06/19 0241 06/27/19 2046 06/27/19 2046 06/27/19 2057 06/28/19 0746 07/27/19 1422  WBC 6.0   < > 11.2*  --   --  7.0 7.1  NEUTROABS 3.8  --  9.0*  --   --   --  5,211  HGB 12.4   < > 13.2   < > 14.3 11.9* 12.7  HCT 39.4   < > 40.3   < > 42.0 36.3 39.7  MCV 93.4   < > 89.4  --   --  88.3 90.6  PLT 334   < > 429*  --   --  398 477*   < > = values in this interval not displayed.   Lab Results  Component Value Date   TSH 2.09 11/10/2018   No results found for: HGBA1C Lab Results  Component Value Date   LDLDIRECT 109.0 03/17/2018     Significant Diagnostic Results in last 30 days:  No results found.  Assessment/Plan 1. Cough in adult Ongoing non-productive cough since February thinks few weeks after getting her COVID - 19 vaccine.Suspect could be related to her post nasal drip though states has had nasal drip for several year.latest Chest X-ray done 06/27/2019 was negative for acute abnormalities.Trachea stridor like  breath sounds noted.Decline steroids.More worried of cervical cancer spreading to the lungs.Will refer to Pulmonology for further evaluation.   - Ambulatory referral to Pulmonology  2. Post-nasal drip Advised to use Flonase nasal spray 2 sprays into both nares. - Continue on Loratadine 10 mg tablet daily  - fluticasone (FLONASE) 50 MCG/ACT nasal spray; Place 2 sprays into both nostrils daily.  Dispense: 16 g; Refill: 6  3. Iron deficiency Latest Hgb stable.Request iron ferrous sulfate refill.  - Iron, Ferrous Sulfate, 325 (65 Fe) MG TABS; Take 1 tablet by mouth every other day.  Dispense: 90 tablet; Refill: 1  Family/ staff Communication: Reviewed plan of care with patient verbalized understanding.   Labs/tests ordered: None   Next Appointment: As needed if symptoms worsen or fail to improve.   Sandrea Hughs, NP

## 2019-08-31 ENCOUNTER — Telehealth: Payer: Self-pay

## 2019-08-31 NOTE — Telephone Encounter (Signed)
Ms. Katie [name], jambor are scheduled for a virtual visit with your provider today.    Just as we do with appointments in the office, we must obtain your consent to participate.  Your consent will be active for this visit and any virtual visit you may have with one of our providers in the next 365 days.    If you have a MyChart account, I can also send a copy of this consent to you electronically.  All virtual visits are billed to your insurance company just like a traditional visit in the office.  As this is a virtual visit, video technology does not allow for your provider to perform a traditional examination.  This may limit your provider's ability to fully assess your condition.  If your provider identifies any concerns that need to be evaluated in person or the need to arrange testing such as labs, EKG, etc, we will make arrangements to do so.    Although advances in technology are sophisticated, we cannot ensure that it will always work on either your end or our end.  If the connection with a video visit is poor, we may have to switch to a telephone visit.  With either a video or telephone visit, we are not always able to ensure that we have a secure connection.   I need to obtain your verbal consent now.   Are you willing to proceed with your visit today?   [name] Peragine has provided verbal consent on 08/31/2019 for a virtual visit (video or telephone).   Carroll Kinds, CMA 08/27/2019  11:14 AM

## 2019-09-06 HISTORY — PX: CATARACT EXTRACTION: SUR2

## 2019-09-16 DIAGNOSIS — H25811 Combined forms of age-related cataract, right eye: Secondary | ICD-10-CM | POA: Diagnosis not present

## 2019-09-16 DIAGNOSIS — H2511 Age-related nuclear cataract, right eye: Secondary | ICD-10-CM | POA: Diagnosis not present

## 2019-09-22 ENCOUNTER — Other Ambulatory Visit: Payer: Self-pay | Admitting: Gastroenterology

## 2019-09-25 ENCOUNTER — Encounter: Payer: Self-pay | Admitting: Podiatry

## 2019-09-25 ENCOUNTER — Other Ambulatory Visit: Payer: Self-pay

## 2019-09-25 ENCOUNTER — Ambulatory Visit (INDEPENDENT_AMBULATORY_CARE_PROVIDER_SITE_OTHER): Payer: Medicare Other | Admitting: Podiatry

## 2019-09-25 DIAGNOSIS — B351 Tinea unguium: Secondary | ICD-10-CM | POA: Diagnosis not present

## 2019-09-25 DIAGNOSIS — G620 Drug-induced polyneuropathy: Secondary | ICD-10-CM | POA: Diagnosis not present

## 2019-09-25 DIAGNOSIS — M79675 Pain in left toe(s): Secondary | ICD-10-CM

## 2019-09-25 DIAGNOSIS — T451X5A Adverse effect of antineoplastic and immunosuppressive drugs, initial encounter: Secondary | ICD-10-CM

## 2019-09-25 DIAGNOSIS — M79674 Pain in right toe(s): Secondary | ICD-10-CM

## 2019-09-25 NOTE — Progress Notes (Signed)
Subjective:  Patient ID: Katie [name], female    DOB: 1940/10/11,  MRN: [id number]  Chief Complaint  Patient presents with  . Nail Problem     callus/nail trim  . Callouses    79 y.o. female presents with the above complaint.  Patient presents with thickened elongated dystrophic toenails x10 that is causing her a lot of pain when ambulating.  She would like to have them debrided down as she is not able to do it herself.    Review of Systems: Negative except as noted in the HPI. Denies N/V/F/Ch.  Past Medical History:  Diagnosis Date  . Arthritis   . Cancer [facility])    Endometrial  . Complication of anesthesia    very hard to wake up from Anesthesia- then when wakes up feels like she is going to faint or have vomiting  . Endometrial cancer [facility])    Per [facility] New Patient Packet   . Family history of adverse reaction to anesthesia    daughter has a hard time waking up from Anesthesia  . GERD (gastroesophageal reflux disease)   . Glaucoma   . History of fainting 2021   Per [place] Patient Packet   . Hypothyroidism   . 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 PONV (postoperative nausea and vomiting)   . Thyroid disease     Current Outpatient Medications:  .  acetaminophen (TYLENOL) 500 MG tablet, Take 500 mg by mouth daily. At bedtime. , Disp: , Rfl:  .  alendronate (FOSAMAX) 70 MG tablet, TAKE 1 TABLET BY MOUTH ONCE A WEEK, WITH WATER. DO NOT LIE DOWN FOR AT LEAST 30 MINUTES AND UNTIL AFTER FIRST MEAL OF THE DAY, Disp: 12 tablet, Rfl: 1 .  bimatoprost (LUMIGAN) 0.01 % SOLN, Place 1 drop into both eyes at bedtime. Can not take Generic, Disp: , Rfl:  .  brimonidine (ALPHAGAN P) 0.1 % SOLN, Place 1 drop into both eyes 2 (two) times daily. Can not take generic, Disp: , Rfl:  .  CALCIUM-VITAMIN D PO, Take 600 mg by mouth., Disp: , Rfl:  .  cholecalciferol (VITAMIN D) 25 MCG (1000 UNIT) tablet, Take 3,000 Units by mouth  daily. , Disp: , Rfl:  .  cholestyramine (QUESTRAN) 4 g packet, Take 4 g by mouth at bedtime., Disp: , Rfl:  .  esomeprazole (NEXIUM) 20 MG capsule, Take 20 mg by mouth daily. , Disp: , Rfl:  .  fluticasone (FLONASE) 50 MCG/ACT nasal spray, Place 2 sprays into both nostrils daily., Disp: 16 g, Rfl: 6 .  Iron, Ferrous Sulfate, 325 (65 Fe) MG TABS, Take 1 tablet by mouth every other day., Disp: 90 tablet, Rfl: 1 .  levothyroxine (SYNTHROID) 75 MCG tablet, TAKE 1 TABLET(75 MCG) BY MOUTH DAILY, Disp: 90 tablet, Rfl: 3 .  Polyethyl Glycol-Propyl Glycol (SYSTANE ULTRA OP), Apply 1 drop to eye as needed. Both Eyes. For dry eyes., Disp: , Rfl:  .  vitamin B-12 (CYANOCOBALAMIN) 1000 MCG tablet, Take 1,000 mcg by mouth daily., Disp: , Rfl:   Social History   Tobacco Use  Smoking Status Never Smoker  Smokeless Tobacco Never Used    Allergies  Allergen Reactions  . Sulfa Antibiotics Rash  . Gabapentin Other (See Comments)    Depressed   . Lyrica [Pregabalin] Other (See Comments)    unknown  . Paxil [Paroxetine]     unknown   Objective:  There were no vitals  filed for this visit. There is no height or weight on file to calculate BMI. Constitutional Well developed. Well nourished.  Vascular Dorsalis pedis pulses palpable bilaterally. Posterior tibial pulses palpable bilaterally. Capillary refill normal to all digits.  No cyanosis or clubbing noted. Pedal hair growth normal.  Neurologic Normal speech. Oriented to person, place, and time. Epicritic sensation to light touch grossly present bilaterally.  Dermatologic No other open wounds. No skin lesions. Nail Exam: Pt has thick disfigured discolored nails with subungual debris noted bilateral entire nail hallux through fifth toenails bilateral lower extremity edema nonpitting in nature.  Generalized in nature.  Orthopedic: Normal joint ROM without pain or crepitus bilaterally. No visible deformities. No bony tenderness.   Radiographs:  None Assessment:   1. Pain due to onychomycosis of toenails of both feet   2. Peripheral neuropathy due to chemotherapy [facility])    Plan:  Patient was evaluated and treated and all questions answered.  Swelling bilateral lower extremity -I explained to patient the etiology of generalized swelling to the bilateral lower extremity.  It appears to be nonpitting swelling.  I discussed with the patient the importance of compression socks and elevation.  Patient states that she will attempt to do that.   Onychomycosis with pain  -Nails palliatively debrided as below. -Educated on self-care  Procedure: Nail Debridement Rationale: pain  Type of Debridement: manual, sharp debridement. Instrumentation: Nail nipper, rotary burr. Number of Nails: 10  Procedures and Treatment: Consent by patient was obtained for treatment procedures. The patient understood the discussion of treatment and procedures well. All questions were answered thoroughly reviewed. Debridement of mycotic and hypertrophic toenails, 1 through 5 bilateral and clearing of subungual debris. No ulceration, no infection noted.  Return Visit-Office Procedure: Patient instructed to return to the office for a follow up visit 3 months for continued evaluation and treatment.  Boneta Lucks, DPM    No follow-ups on file.   No follow-ups on file.

## 2019-10-26 ENCOUNTER — Encounter: Payer: Self-pay | Admitting: Pulmonary Disease

## 2019-10-26 ENCOUNTER — Other Ambulatory Visit: Payer: Self-pay

## 2019-10-26 ENCOUNTER — Ambulatory Visit (INDEPENDENT_AMBULATORY_CARE_PROVIDER_SITE_OTHER): Payer: Medicare Other | Admitting: Pulmonary Disease

## 2019-10-26 VITALS — BP 134/72 | HR 85 | Temp 98.3°F | Ht 60.0 in | Wt 137.0 lb

## 2019-10-26 DIAGNOSIS — R0600 Dyspnea, unspecified: Secondary | ICD-10-CM | POA: Diagnosis not present

## 2019-10-26 DIAGNOSIS — R05 Cough: Secondary | ICD-10-CM | POA: Diagnosis not present

## 2019-10-26 DIAGNOSIS — R059 Cough, unspecified: Secondary | ICD-10-CM

## 2019-10-26 DIAGNOSIS — R0982 Postnasal drip: Secondary | ICD-10-CM

## 2019-10-26 DIAGNOSIS — J4530 Mild persistent asthma, uncomplicated: Secondary | ICD-10-CM | POA: Diagnosis not present

## 2019-10-26 MED ORDER — IPRATROPIUM BROMIDE 0.03 % NA SOLN: 2.0000 | Freq: Three times a day (TID) | NASAL | 12 refills | Status: DC | PRN

## 2019-10-26 MED ORDER — FLUTICASONE-SALMETEROL 250-50 MCG/DOSE IN AEPB
1.0000 | INHALATION_SPRAY | Freq: Two times a day (BID) | RESPIRATORY_TRACT | 11 refills | Status: DC
Start: 2019-10-26 — End: 2019-11-19

## 2019-10-26 NOTE — Progress Notes (Signed)
Synopsis: Referred by Marlowe Sax, NP for shortness of breath  Subjective:   PATIENT ID: Katie [name] GENDER: female DOB: [birth year], MRN: [id number]   HPI  Chief Complaint  Patient presents with  . Consult    Coughing, wheezing and SOB   Katie [name] is an 79 year old woman, never smoker with history of endometrial cancer s/p surgical resection, chemotherapy and radiation therapy who is referred to pulmonary clinic for cough and shortness of breath.   The cough started in 04/2019 and she experiences it at all times. The cough is worse when she lying down on her side. She reports nasal drainage and post nasal drip. She has been taking flonase without much relief. The cough is mainly dry. She has developed wheezing and shortness of breath since the cough started. Otherwise she denies history of respiratory issues previously. She denies seasonal allergies.   Past Medical History:  Diagnosis Date  . Arthritis   . Cancer [facility])    Endometrial  . Complication of anesthesia    very hard to wake up from Anesthesia- then when wakes up feels like she is going to faint or have vomiting  . Endometrial cancer [facility])    Per [facility] New Patient Packet   . Family history of adverse reaction to anesthesia    daughter has a hard time waking up from Anesthesia  . GERD (gastroesophageal reflux disease)   . Glaucoma   . History of fainting 2021   Per [place] Patient Packet   . Hypothyroidism   . 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 PONV (postoperative nausea and vomiting)   . Thyroid disease      Family History  Problem Relation Age of Onset  . Arthritis Mother   . Osteoporosis Mother   . Heart disease Father   . Hearing loss Maternal Grandmother   . Hearing loss Maternal Grandfather   . Cancer Paternal Grandfather   . Cancer Sister      Social History   Socioeconomic History  . Marital status: Widowed     Spouse name: Not on file  . Number of children: Not on file  . Years of education: Not on file  . Highest education level: Not on file  Occupational History  . Occupation: retired  Tobacco Use  . Smoking status: Never Smoker  . Smokeless tobacco: Never Used  Vaping Use  . Vaping Use: Never used  Substance and Sexual Activity  . Alcohol use: No  . Drug use: No  . Sexual activity: Not on file  Other Topics Concern  . Not on file  Social History Narrative   Right handed   One story apartment   One cup coffee daily and tea      Per [place] New Patient Packet Abstracted on 07/13/2019 by Chrae Beatty/CMA      Diet: left blank      Caffeine: Yes      Married, if yes what year: Widowed, married in [place] you live in a house, apartment, assisted living, [place], trailer, ect: Apartment, 1 person      Is it one or more stories: Yes, patient lives on ground floor       Pets: No      Current/Past profession: Optometrist      Highest level or education completed: [employer]       Exercise:  Not enough            Type and how often:          Living Will: Yes   DNR: Yes    POA/HPOA: Yes      Functional Status:   Do you have difficulty bathing or dressing yourself? Left blank   Do you have difficulty preparing food or eating? Left blank   Do you have difficulty managing your medications? Left blank   Do you have difficulty managing your finances? Left blank   Do you have difficulty affording your medications? Left blank      Social Determinants of Health   Financial Resource Strain:   . Difficulty of Paying Living Expenses: Not on file  Food Insecurity:   . Worried About Charity fundraiser in the Last Year: Not on file  . Ran Out of Food in the Last Year: Not on file  Transportation Needs:   . Lack of Transportation (Medical): Not on file  . Lack of Transportation (Non-Medical): Not on file  Physical Activity:   . Days of Exercise per Week: Not on file  . Minutes  of Exercise per Session: Not on file  Stress:   . Feeling of Stress : Not on file  Social Connections:   . Frequency of Communication with Friends and Family: Not on file  . Frequency of Social Gatherings with Friends and Family: Not on file  . Attends Religious Services: Not on file  . Active Member of Clubs or Organizations: Not on file  . Attends Archivist Meetings: Not on file  . Marital Status: Not on file  Intimate Partner Violence:   . Fear of Current or Ex-Partner: Not on file  . Emotionally Abused: Not on file  . Physically Abused: Not on file  . Sexually Abused: Not on file     Allergies  Allergen Reactions  . Sulfa Antibiotics Rash  . Gabapentin Other (See Comments)    Depressed   . Lyrica [Pregabalin] Other (See Comments)    unknown  . Paxil [Paroxetine]     unknown     Outpatient Medications Prior to Visit  Medication Sig Dispense Refill  . acetaminophen (TYLENOL) 500 MG tablet Take 500 mg by mouth daily. At bedtime.     Marland Kitchen alendronate (FOSAMAX) 70 MG tablet TAKE 1 TABLET BY MOUTH ONCE A WEEK, WITH WATER. DO NOT LIE DOWN FOR AT LEAST 30 MINUTES AND UNTIL AFTER FIRST MEAL OF THE DAY 12 tablet 1  . bimatoprost (LUMIGAN) 0.01 % SOLN Place 1 drop into both eyes at bedtime. Can not take Generic    . brimonidine (ALPHAGAN P) 0.1 % SOLN Place 1 drop into both eyes 2 (two) times daily. Can not take generic    . CALCIUM-VITAMIN D PO Take 600 mg by mouth.    . cholecalciferol (VITAMIN D) 25 MCG (1000 UNIT) tablet Take 3,000 Units by mouth daily.     . cholestyramine (QUESTRAN) 4 g packet Take 4 g by mouth at bedtime.    Marland Kitchen esomeprazole (NEXIUM) 20 MG capsule Take 20 mg by mouth daily.     . fluticasone (FLONASE) 50 MCG/ACT nasal spray Place 2 sprays into both nostrils daily. 16 g 6  . Iron, Ferrous Sulfate, 325 (65 Fe) MG TABS Take 1 tablet by mouth every other day. 90 tablet 1  . levothyroxine (SYNTHROID) 75 MCG tablet TAKE 1 TABLET(75 MCG) BY MOUTH DAILY 90  tablet 3  . Polyethyl  Glycol-Propyl Glycol (SYSTANE ULTRA OP) Apply 1 drop to eye as needed. Both Eyes. For dry eyes.    . vitamin B-12 (CYANOCOBALAMIN) 1000 MCG tablet Take 1,000 mcg by mouth daily.     No facility-administered medications prior to visit.    Review of Systems  Constitutional: Negative for chills, diaphoresis, fever, malaise/fatigue and weight loss.  HENT: Positive for congestion.   Eyes: Negative.   Respiratory: Positive for cough, shortness of breath and wheezing. Negative for hemoptysis and sputum production.   Cardiovascular: Negative for chest pain, palpitations, orthopnea, claudication, leg swelling and PND.  Gastrointestinal: Negative for abdominal pain, blood in stool, constipation, diarrhea, heartburn, melena, nausea and vomiting.  Genitourinary: Negative for dysuria and hematuria.  Musculoskeletal: Negative.   Skin: Negative for itching and rash.  Neurological: Negative.   Endo/Heme/Allergies: Negative.   Psychiatric/Behavioral: Negative.    Objective:  Physical Exam Constitutional:      Appearance: Normal appearance. She is normal weight.  HENT:     Head: Normocephalic and atraumatic.     Nose: Nose normal.     Mouth/Throat:     Mouth: Mucous membranes are moist.     Pharynx: Oropharynx is clear.  Eyes:     General: No scleral icterus.    Extraocular Movements: Extraocular movements intact.     Conjunctiva/sclera: Conjunctivae normal.     Pupils: Pupils are equal, round, and reactive to light.  Cardiovascular:     Rate and Rhythm: Normal rate and regular rhythm.     Pulses: Normal pulses.     Heart sounds: Normal heart sounds. No murmur heard.   Pulmonary:     Effort: Pulmonary effort is normal.     Breath sounds: Normal breath sounds.  Abdominal:     General: Bowel sounds are normal. There is no distension.     Palpations: Abdomen is soft.  Musculoskeletal:     Cervical back: Neck supple.     Right lower leg: No edema.     Left lower leg:  No edema.  Lymphadenopathy:     Cervical: No cervical adenopathy.  Skin:    General: Skin is warm and dry.  Neurological:     General: No focal deficit present.     Mental Status: She is alert and oriented to person, place, and time. Mental status is at baseline.     Gait: Gait normal.  Psychiatric:        Mood and Affect: Mood normal.        Behavior: Behavior normal.        Thought Content: Thought content normal.        Judgment: Judgment normal.    Vitals:   10/26/19 1613  BP: 134/72  Pulse: 85  Temp: 98.3 F (36.8 C)  TempSrc: Oral  SpO2: 96%  Weight: 137 lb (62.1 kg)  Height: 5' (1.524 m)    CBC    Component Value Date/Time   WBC 7.1 07/27/2019 1422   RBC 4.38 07/27/2019 1422   HGB 12.7 07/27/2019 1422   HCT 39.7 07/27/2019 1422   PLT 477 (H) 07/27/2019 1422   MCV 90.6 07/27/2019 1422   MCH 29.0 07/27/2019 1422   MCHC 32.0 07/27/2019 1422   RDW 12.8 07/27/2019 1422   LYMPHSABS 1,037 07/27/2019 1422   MONOABS 0.9 06/27/2019 2046   EOSABS 121 07/27/2019 1422   BASOSABS 28 07/27/2019 1422   BMP Latest Ref Rng & Units 07/27/2019 06/28/2019 06/27/2019  Glucose 65 - 139 mg/dL 103 98 113(H)  BUN 7 - 25 mg/dL 17 13 25(H)  Creatinine 0.60 - 0.88 mg/dL 0.87 0.70 0.80  BUN/Creat Ratio 6 - 22 (calc) NOT APPLICABLE - -  Sodium 979 - 146 mmol/L 133(L) 130(L) 126(L)  Potassium 3.5 - 5.3 mmol/L 4.2 3.5 4.2  Chloride 98 - 110 mmol/L 97(L) 93(L) 91(L)  CO2 20 - 32 mmol/L 26 29 -  Calcium 8.6 - 10.4 mg/dL 10.3 8.8(L) -    Chest imaging: CXR 06/27/19 The heart size and mediastinal contours are within normal limits. Both lungs are clear. The visualized skeletal structures are Unremarkable.  PFT: No PFT on File Labs: Reviewed as above  Path:  Echo: 06/28/19 1. Left ventricular ejection fraction, by estimation, is 60 to 65%. The  left ventricle has normal function. The left ventricle has no regional  wall motion abnormalities. There is mild asymmetric left  ventricular  hypertrophy of the basal-septal segment.  Left ventricular diastolic parameters are consistent with Grade I  diastolic dysfunction (impaired relaxation). Elevated left ventricular  end-diastolic pressure. The average left ventricular global longitudinal  strain is -13.4 %.  2. Right ventricular systolic function is normal. The right ventricular  size is normal. There is normal pulmonary artery systolic pressure.  3. The mitral valve is normal in structure. No evidence of mitral valve  regurgitation. No evidence of mitral stenosis.  4. The aortic valve is normal in structure. Aortic valve regurgitation is  not visualized. No aortic stenosis is present.  5. The inferior vena cava is normal in size with greater than 50%  respiratory variability, suggesting right atrial pressure of 3 mmHg.    Assessment & Plan:   Mild persistent reactive airway disease without complication  Cough  Dyspnea, unspecified type  Post-nasal drip  Discussion: Katie [name] is an 79 year old woman, never smoker with history of endometrial cancer s/p surgical resection, chemotherapy and radiation therapy who is referred to pulmonary clinic for cough and shortness of breath.   The cough, shortness of breath and wheezing are likely secondary to reactive airways disease in setting of post-nasal drip. We will add ipratropium nasal spray in addition to the flonase nasal spray for the rhinitis and post nasal drip. She is to start advair 1 puff twice daily.   We will monitor her symptoms and check pulmonary function tests in the future if she should continue to have symptoms.   Follow up in 3-4 months  Freda Jackson, MD [name] Office: [phone]    Current Outpatient Medications:  .  acetaminophen (TYLENOL) 500 MG tablet, Take 500 mg by mouth daily. At bedtime. , Disp: , Rfl:  .  alendronate (FOSAMAX) 70 MG tablet, TAKE 1 TABLET BY MOUTH ONCE A WEEK, WITH  WATER. DO NOT LIE DOWN FOR AT LEAST 30 MINUTES AND UNTIL AFTER FIRST MEAL OF THE DAY, Disp: 12 tablet, Rfl: 1 .  bimatoprost (LUMIGAN) 0.01 % SOLN, Place 1 drop into both eyes at bedtime. Can not take Generic, Disp: , Rfl:  .  brimonidine (ALPHAGAN P) 0.1 % SOLN, Place 1 drop into both eyes 2 (two) times daily. Can not take generic, Disp: , Rfl:  .  CALCIUM-VITAMIN D PO, Take 600 mg by mouth., Disp: , Rfl:  .  cholecalciferol (VITAMIN D) 25 MCG (1000 UNIT) tablet, Take 3,000 Units by mouth daily. , Disp: , Rfl:  .  cholestyramine (QUESTRAN) 4 g packet, Take 4 g by mouth at bedtime., Disp: , Rfl:  .  esomeprazole (Ali Molina) 20  MG capsule, Take 20 mg by mouth daily. , Disp: , Rfl:  .  fluticasone (FLONASE) 50 MCG/ACT nasal spray, Place 2 sprays into both nostrils daily., Disp: 16 g, Rfl: 6 .  Iron, Ferrous Sulfate, 325 (65 Fe) MG TABS, Take 1 tablet by mouth every other day., Disp: 90 tablet, Rfl: 1 .  levothyroxine (SYNTHROID) 75 MCG tablet, TAKE 1 TABLET(75 MCG) BY MOUTH DAILY, Disp: 90 tablet, Rfl: 3 .  Polyethyl Glycol-Propyl Glycol (SYSTANE ULTRA OP), Apply 1 drop to eye as needed. Both Eyes. For dry eyes., Disp: , Rfl:  .  vitamin B-12 (CYANOCOBALAMIN) 1000 MCG tablet, Take 1,000 mcg by mouth daily., Disp: , Rfl:  .  Fluticasone-Salmeterol (ADVAIR) 250-50 MCG/DOSE AEPB, Inhale 1 puff into the lungs every 12 (twelve) hours., Disp: 60 each, Rfl: 11 .  ipratropium (ATROVENT) 0.03 % nasal spray, Place 2 sprays into both nostrils 3 (three) times daily as needed for rhinitis (post nasal drip)., Disp: 30 mL, Rfl: 12

## 2019-10-26 NOTE — Patient Instructions (Addendum)
-   start advair diskus 1 puff twice daily (rinse mouth out with water after) - Start ipratropium nasal spray 3 times per day as needed for post nasal drip - Use spacer chamber with albuterol as needed. Albuterol can be used every 4-6 hours 1 -2 puffs as needed.

## 2019-10-28 ENCOUNTER — Ambulatory Visit: Payer: Medicare Other | Admitting: Nurse Practitioner

## 2019-10-30 ENCOUNTER — Telehealth: Payer: Self-pay | Admitting: Pulmonary Disease

## 2019-10-30 NOTE — Telephone Encounter (Signed)
Spoke with the pt  She is asking if she needs to stop flonase since we added ipratropium NS  I advised no, we just added the ipratropium to her current regimen  Nothing further needed

## 2019-11-01 DIAGNOSIS — Z23 Encounter for immunization: Secondary | ICD-10-CM | POA: Diagnosis not present

## 2019-11-02 ENCOUNTER — Encounter: Payer: Self-pay | Admitting: Nurse Practitioner

## 2019-11-02 ENCOUNTER — Other Ambulatory Visit: Payer: Self-pay

## 2019-11-02 ENCOUNTER — Ambulatory Visit (INDEPENDENT_AMBULATORY_CARE_PROVIDER_SITE_OTHER): Payer: Medicare Other | Admitting: Nurse Practitioner

## 2019-11-02 VITALS — BP 130/80 | HR 84 | Temp 98.0°F | Ht 60.0 in | Wt 136.2 lb

## 2019-11-02 DIAGNOSIS — R600 Localized edema: Secondary | ICD-10-CM

## 2019-11-02 DIAGNOSIS — G629 Polyneuropathy, unspecified: Secondary | ICD-10-CM

## 2019-11-02 DIAGNOSIS — E611 Iron deficiency: Secondary | ICD-10-CM | POA: Diagnosis not present

## 2019-11-02 DIAGNOSIS — E871 Hypo-osmolality and hyponatremia: Secondary | ICD-10-CM | POA: Diagnosis not present

## 2019-11-02 DIAGNOSIS — Z23 Encounter for immunization: Secondary | ICD-10-CM

## 2019-11-02 DIAGNOSIS — I739 Peripheral vascular disease, unspecified: Secondary | ICD-10-CM | POA: Diagnosis not present

## 2019-11-02 NOTE — Progress Notes (Signed)
Careteam: Patient Care Team: [name], NP as PCP - General (Geriatric Medicine) Alda Berthold, DO as Consulting Physician (Neurology) Gaynelle Arabian, MD as Consulting Physician (Orthopedic Surgery) Felipa Furnace, DPM as Consulting Physician (Podiatry) Renne Crigler, MD as Referring Physician (Obstetrics and Gynecology) Jola Schmidt, MD as Consulting Physician (Ophthalmology)  PLACE OF SERVICE:  [place] Directive information Does Patient Have a Medical Advance Directive?: Yes, Type of Advance Directive: [place];Living will;Out of facility DNR (pink MOST or yellow form), Pre-existing out of facility DNR order (yellow form or pink MOST form): Pink MOST form placed in chart (order not valid for inpatient use);Yellow form placed in chart (order not valid for inpatient use), Does patient want to make changes to medical advance directive?: No - Patient declined  Allergies  Allergen Reactions   Sulfa Antibiotics Rash   Gabapentin Other (See Comments)    Depressed    Lyrica [Pregabalin] Other (See Comments)    unknown   Paxil [Paroxetine]     unknown    Chief Complaint  Patient presents with   Medical Management of Chronic Issues    3 month follow up. Patient states she is still coughing,but has been to see pulmonologist. Patient would like to know if she is ok to get the Flu vaccine since she just received her COVID booster.   Best Practice Recommendations    Flu vaccine.     HPI: Patient is a 79 y.o. female for routine follow up.   Followed with pulmonary due to chronic cough, noted to have reactive airway  Started her on Advair twice daily with albuterol PRN Continues on flonase daily with ipratropium   LE edema- reports somewhat improved, got recliner and feet more elevated.   Iron def anemia- stopped iron due to constipation.   cataract surgery in august, still on eye drops   Blood pressure elevated today but  she drove and that has gotten her excited.  Review of Systems:  Review of Systems  Constitutional: Negative for chills, fever and weight loss.  HENT: Negative for tinnitus.   Respiratory: Positive for cough. Negative for sputum production and shortness of breath.   Cardiovascular: Positive for leg swelling. Negative for chest pain and palpitations.  Gastrointestinal: Negative for abdominal pain, constipation, diarrhea and heartburn.  Genitourinary: Negative for dysuria, frequency and urgency.  Musculoskeletal: Negative for back pain, falls, joint pain and myalgias.  Skin: Negative.   Neurological: Negative for dizziness and headaches.  Psychiatric/Behavioral: Negative for depression and memory loss. The patient does not have insomnia.     Past Medical History:  Diagnosis Date   Arthritis    Cancer [facility])    Endometrial   Complication of anesthesia    very hard to wake up from Anesthesia- then when wakes up feels like she is going to faint or have vomiting   Endometrial cancer ([place])    Per [facility] New Patient Packet    Family history of adverse reaction to anesthesia    daughter has a hard time waking up from Anesthesia   GERD (gastroesophageal reflux disease)    Glaucoma    History of fainting 2021   Per [place] New Patient Packet    Hypothyroidism    Neuromuscular disorder ([place])    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 She has never used smokeless tobacco. She reports that she does not drink alcohol and does not use drugs.  Family History  Problem Relation Age of Onset   Arthritis Mother    Osteoporosis Mother    Heart disease Father    Hearing loss Maternal Grandmother    Hearing loss Maternal Grandfather    Cancer Paternal Grandfather    Cancer Sister     Medications: Patient's Medications  New Prescriptions   No medications on file  Previous Medications   ACETAMINOPHEN (TYLENOL) 500 MG TABLET    Take 500 mg by mouth daily. At bedtime.    ALENDRONATE (FOSAMAX) 70 MG TABLET    TAKE 1 TABLET BY MOUTH ONCE A WEEK, WITH WATER. DO NOT LIE DOWN FOR AT LEAST 30 MINUTES AND UNTIL AFTER FIRST MEAL OF THE DAY   BIMATOPROST (LUMIGAN) 0.01 % SOLN    Place 1 drop into both eyes at bedtime. Can not take Generic   BRIMONIDINE (ALPHAGAN P) 0.1 % SOLN    Place 1 drop into both eyes 2 (two) times daily. Can not take generic   CALCIUM-VITAMIN D PO    Take 600 mg by mouth.   CHLORTHALIDONE (HYGROTON) 25 MG TABLET       CHOLECALCIFEROL (VITAMIN D) 25 MCG (1000 UNIT) TABLET    Take 3,000 Units by mouth daily.    CHOLESTYRAMINE (QUESTRAN) 4 G PACKET    Take 4 g by mouth at bedtime.   ESOMEPRAZOLE (NEXIUM) 20 MG CAPSULE    Take 20 mg by mouth daily.    FLUTICASONE (FLONASE) 50 MCG/ACT NASAL SPRAY    Place 2 sprays into both nostrils daily.   FLUTICASONE-SALMETEROL (ADVAIR) 250-50 MCG/DOSE AEPB    Inhale 1 puff into the lungs every 12 (twelve) hours.   IPRATROPIUM (ATROVENT) 0.03 % NASAL SPRAY    Place 2 sprays into both nostrils 3 (three) times daily as needed for rhinitis (post nasal drip).   IRON, FERROUS SULFATE, 325 (65 FE) MG TABS    Take 1 tablet by mouth every other day.   LEVOTHYROXINE (SYNTHROID) 75 MCG TABLET     TAKE 1 TABLET(75 MCG) BY MOUTH DAILY   MOXIFLOXACIN (VIGAMOX) 0.5 % OPHTHALMIC SOLUTION    Place 1 drop into the right eye 4 (four) times daily.   POLYETHYL GLYCOL-PROPYL GLYCOL (SYSTANE ULTRA OP)    Apply 1 drop to eye as needed. Both Eyes. For dry eyes.   PREDNISOLONE ACETATE (PRED FORTE) 1 % OPHTHALMIC SUSPENSION    Place 1 drop into the right eye 3 (three) times daily.   VITAMIN B-12 (CYANOCOBALAMIN) 1000 MCG TABLET    Take 1,000 mcg by mouth daily.  Modified Medications   No medications on file  Discontinued Medications   No medications on file  Physical Exam:  Vitals:   11/02/19 1602 11/02/19 1631  BP: (!) 170/80 130/80  Pulse: 84   Temp: 98 F (36.7 C)   TempSrc: Temporal   SpO2: 98%   Weight: 136 lb 3.2 oz (61.8 kg)   Height: 5' (1.524 m)    Body mass index is 26.6 kg/m. Wt Readings from Last 3 Encounters:  11/02/19 136 lb 3.2 oz (61.8 kg)  10/26/19 137 lb (62.1 kg)  08/28/19 139 lb 6.4 oz (63.2 kg)    Physical Exam Constitutional:      General: She is not in acute distress.    Appearance: She is well-developed. She is not diaphoretic.  HENT:     Head: Normocephalic and atraumatic.  Eyes:     Conjunctiva/sclera: Conjunctivae normal.     Pupils: Pupils are equal, round, and reactive to light.  Cardiovascular:     Rate and Rhythm: Normal rate and regular rhythm.     Heart sounds: Normal heart sounds.  Pulmonary:     Effort: Pulmonary effort is normal.     Breath sounds: Normal breath sounds.  Abdominal:     General: Bowel sounds are normal.     Palpations: Abdomen is soft.  Musculoskeletal:        General: No tenderness.     Cervical back: Normal range of motion and neck supple.  Skin:    General: Skin is warm and dry.  Neurological:     Mental Status: She is alert and oriented to person, place, and time.     Labs reviewed: Basic Metabolic Panel: Recent Labs    11/10/18 1302 12/16/18 1425 06/15/19 1409 06/15/19 1409 06/27/19 2046  06/27/19 2046 06/27/19 2057 06/28/19 0746 07/27/19 1422  NA  --    < > 132*   < > 127*   < > 126* 130* 133*  K  --    < > 3.6   < > 4.6   < > 4.2 3.5 4.2  CL  --    < > 100   < > 90*   < > 91* 93* 97*  CO2  --    < > 25   < > 26  --   --  29 26  GLUCOSE  --    < > 142*   < > 116*   < > 113* 98 103  BUN  --    < > 17   < > 19   < > 25* 13 17  CREATININE  --    < > 0.83   < > 0.86   < > 0.80 0.70 0.87  CALCIUM  --    < > 9.2   < > 9.0  --   --  8.8* 10.3  MG  --   --  1.9  --  2.1  --   --   --   --   TSH 2.09  --   --   --   --   --   --   --   --    < > = values in this interval not displayed.   Liver Function Tests: Recent Labs    12/29/18 1442 06/27/19 2046  AST 25 33  ALT 22 10  ALKPHOS 38 42  BILITOT 0.6 0.8  PROT 6.9 6.4*  ALBUMIN 3.8 3.5   No results for input(s): LIPASE, AMYLASE in the last 8760 hours. No results for input(s): AMMONIA in the last 8760 hours. CBC: Recent Labs  12/29/18 1442 01/06/19 0241 06/27/19 2046 06/27/19 2046 06/27/19 2057 06/28/19 0746 07/27/19 1422  WBC 6.0   < > 11.2*  --   --  7.0 7.1  NEUTROABS 3.8  --  9.0*  --   --   --  5,211  HGB 12.4   < > 13.2   < > 14.3 11.9* 12.7  HCT 39.4   < > 40.3   < > 42.0 36.3 39.7  MCV 93.4   < > 89.4  --   --  88.3 90.6  PLT 334   < > 429*  --   --  398 477*   < > = values in this interval not displayed.   Lipid Panel: No results for input(s): CHOL, HDL, LDLCALC, TRIG, CHOLHDL, LDLDIRECT in the last 8760 hours. TSH: Recent Labs    11/10/18 1302  TSH 2.09   A1C: No results found for: HGBA1C   Assessment/Plan 1. Need for influenza vaccination - Flu Vaccine QUAD High Dose(Fluad)  2. Iron deficiency Stopped iron due to significant constipation, educated to eat foods rich in iron. Will follow up cbc prior to next OV. - CBC with Differential/Platelet; Future  3. Neuropathy Stable at this time, does not require medications.   4. Hyponatremia Will follow.  - COMPLETE METABOLIC PANEL  WITH GFR; Future  5. Bilateral leg edema Stable, continue elevation, compression hose and low sodium diet.  - COMPLETE METABOLIC PANEL WITH GFR; Future  6. PVD (peripheral vascular disease) ([place]) Stable, without increase in pain, followed by podiatry      Next appt: 3 months, las prior to appt  Ibrohim Simmers K. [place], [facility] [phone]

## 2019-11-02 NOTE — Patient Instructions (Signed)
Follow up in 3 months with labs prior to appt

## 2019-11-05 DIAGNOSIS — I739 Peripheral vascular disease, unspecified: Secondary | ICD-10-CM | POA: Insufficient documentation

## 2019-11-06 ENCOUNTER — Telehealth: Payer: Self-pay | Admitting: Pulmonary Disease

## 2019-11-06 NOTE — Telephone Encounter (Signed)
Called pt and there was no answer- LMTCB x 1

## 2019-11-06 NOTE — Telephone Encounter (Signed)
Called and spoke with the pt  She states that she went to her ophthalmologist yesterday for f/u on her recent cataract surgery she had back in August 2021  She was advised that her pressure her eyes was elevated  She was advised to stop all steroids including her advair  She is asking if Dr Erin Fulling has any other recommendations of a different inhaler  Please advise, thanks!

## 2019-11-06 NOTE — Telephone Encounter (Signed)
She can take albuterol inhaler as needed for her cough and wheezing. Ok to stop [employer].

## 2019-11-06 NOTE — Telephone Encounter (Signed)
Pt returning a phone call. Pt can be reached at [phone]

## 2019-11-09 NOTE — Telephone Encounter (Signed)
Spoke with the pt and notified of response per Dr Erin Fulling  Pt verbalized understanding and nothing further needed

## 2019-11-19 ENCOUNTER — Other Ambulatory Visit: Payer: Self-pay

## 2019-11-19 ENCOUNTER — Ambulatory Visit (INDEPENDENT_AMBULATORY_CARE_PROVIDER_SITE_OTHER): Payer: Medicare Other | Admitting: Nurse Practitioner

## 2019-11-19 ENCOUNTER — Encounter: Payer: Self-pay | Admitting: Nurse Practitioner

## 2019-11-19 VITALS — BP 142/80 | HR 74 | Temp 97.1°F | Ht 60.0 in | Wt 137.0 lb

## 2019-11-19 DIAGNOSIS — R310 Gross hematuria: Secondary | ICD-10-CM | POA: Diagnosis not present

## 2019-11-19 LAB — POCT URINALYSIS DIPSTICK
Bilirubin, UA: NEGATIVE
Glucose, UA: NEGATIVE
Ketones, UA: NEGATIVE
Leukocytes, UA: NEGATIVE
Nitrite, UA: NEGATIVE
Protein, UA: NEGATIVE
Spec Grav, UA: 1.02 (ref 1.010–1.025)
Urobilinogen, UA: NEGATIVE E.U./dL — AB
pH, UA: 6.5 (ref 5.0–8.0)

## 2019-11-19 NOTE — Progress Notes (Signed)
Careteam: Patient Care Team: [name], NP as PCP - General (Geriatric Medicine) Alda Berthold, DO as Consulting Physician (Neurology) Gaynelle Arabian, MD as Consulting Physician (Orthopedic Surgery) Felipa Furnace, DPM as Consulting Physician (Podiatry) Renne Crigler, MD as Referring Physician (Obstetrics and Gynecology) Jola Schmidt, MD as Consulting Physician (Ophthalmology)  PLACE OF SERVICE:  [place]  Advanced Directive information    Allergies  Allergen Reactions   Sulfa Antibiotics Rash   Gabapentin Other (See Comments)    Depressed    Lyrica [Pregabalin] Other (See Comments)    unknown   Paxil [Paroxetine]     unknown    Chief Complaint  Patient presents with   Acute Visit    Patient c/o blood in toilet at 4 am, not sure of source, patient only had 1 episode. Patient also c/o dark stool (darker than usual). Patient denies any pain or discomfort.      HPI: Patient is a 79 y.o. female who comes into office to be evaluated after blood in the toilet.  Noted blood in the toilet in the morning 10/13. Reports she went to urinate and turned around and the toilet was full of blood which was a surprise. Noted blood when wiping as well. No noted blood in stools but stools have been darker than usual. States she had blood in her pad that night but since has not had any bleeding and she has been very diligent about monitoring.  No shortness of breath, feeling lightheaded, chest pains.  She has had a total hytesteromy due to  Review of Systems:  Review of Systems  Constitutional: Negative for chills, fever and weight loss.  HENT: Negative for hearing loss.   Respiratory: Positive for cough (chronic and improved). Negative for sputum production and shortness of breath.   Cardiovascular: Negative for chest pain, palpitations and leg swelling.  Gastrointestinal: Negative for abdominal pain, constipation, diarrhea and heartburn.  Genitourinary: Positive  for hematuria. Negative for dysuria, frequency and urgency.  Musculoskeletal: Negative for back pain, falls, joint pain and myalgias.  Skin: Negative.   Neurological: Negative for dizziness, focal weakness and headaches.    Past Medical History:  Diagnosis Date   Arthritis    Cancer [facility])    Endometrial   Complication of anesthesia    very hard to wake up from Anesthesia- then when wakes up feels like she is going to faint or have vomiting   Endometrial cancer ([place])    Per [facility] New Patient Packet    Family history of adverse reaction to anesthesia    daughter has a hard time waking up from Anesthesia   GERD (gastroesophageal reflux disease)    Glaucoma    History of fainting 2021   Per [place] New Patient Packet    Hypothyroidism    Neuromuscular disorder ([place])    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 Gwenlyn Perking  TOTAL KNEE ARTHROPLASTY Right 01/05/2019   Procedure: TOTAL KNEE ARTHROPLASTY;  Surgeon: Gaynelle Arabian, MD;  Location: [facility];  Service: Orthopedics;  Laterality: Right;  85min   Social History:   reports that she has never smoked. She has never used smokeless tobacco. She reports that she does not drink alcohol and does not use drugs.  Family History  Problem Relation Age of Onset   Arthritis Mother    Osteoporosis Mother    Heart disease Father    Hearing loss Maternal Grandmother    Hearing loss Maternal Grandfather    Cancer Paternal Grandfather     Cancer Sister     Medications: Patient's Medications  New Prescriptions   No medications on file  Previous Medications   ACETAMINOPHEN (TYLENOL) 500 MG TABLET    Take 500 mg by mouth daily. At bedtime.    ALENDRONATE (FOSAMAX) 70 MG TABLET    TAKE 1 TABLET BY MOUTH ONCE A WEEK, WITH WATER. DO NOT LIE DOWN FOR AT LEAST 30 MINUTES AND UNTIL AFTER FIRST MEAL OF THE DAY   BIMATOPROST (LUMIGAN) 0.01 % SOLN    Place 1 drop into both eyes at bedtime. Can not take Generic   BRIMONIDINE (ALPHAGAN P) 0.1 % SOLN    Place 1 drop into both eyes 2 (two) times daily. Can not take generic   CALCIUM-VITAMIN D PO    Take 600 mg by mouth.   CHLORTHALIDONE (HYGROTON) 25 MG TABLET       CHOLECALCIFEROL (VITAMIN D) 25 MCG (1000 UNIT) TABLET    Take 3,000 Units by mouth daily.    CHOLESTYRAMINE (QUESTRAN) 4 G PACKET    Take 4 g by mouth at bedtime.   ESOMEPRAZOLE (NEXIUM) 20 MG CAPSULE    Take 20 mg by mouth daily.    LEVOTHYROXINE (SYNTHROID) 75 MCG TABLET    TAKE 1 TABLET(75 MCG) BY MOUTH DAILY   POLYETHYL GLYCOL-PROPYL GLYCOL (SYSTANE ULTRA OP)    Apply 1 drop to eye as needed. Both Eyes. For dry eyes.   PREDNISOLONE ACETATE (PRED FORTE) 1 % OPHTHALMIC SUSPENSION    Place 1 drop into the right eye 3 (three) times daily.   VITAMIN B-12 (CYANOCOBALAMIN) 1000 MCG TABLET    Take 1,000 mcg by mouth daily.  Modified Medications   No medications on file  Discontinued Medications   FLUTICASONE (FLONASE) 50 MCG/ACT NASAL SPRAY    Place 2 sprays into both nostrils daily.   FLUTICASONE-SALMETEROL (ADVAIR) 250-50 MCG/DOSE AEPB    Inhale 1 puff into the lungs every 12 (twelve) hours.   IPRATROPIUM (ATROVENT) 0.03 % NASAL SPRAY    Place 2 sprays into both nostrils 3 (three) times daily as needed for rhinitis (post nasal drip).   MOXIFLOXACIN (VIGAMOX) 0.5 % OPHTHALMIC SOLUTION    Place 1 drop into the right eye 4 (four) times daily.    Physical Exam:  Vitals:   11/19/19 1327  BP: (!) 142/80  Pulse: 74  Temp: (!)  97.1 F (36.2 C)  TempSrc: Temporal  SpO2: 97%  Weight: 137 lb (62.1 kg)  Height: 5' (1.524 m)   Body mass index is 26.76 kg/m. Wt Readings from Last 3 Encounters:  11/19/19 137 lb (62.1 kg)  11/02/19 136 lb 3.2 oz (61.8 kg)  10/26/19 137 lb (62.1 kg)    Physical Exam Exam conducted with a chaperone present.  Constitutional:      General: She is not in acute distress.    Appearance: She is well-developed. She  is not diaphoretic.  HENT:     Head: Normocephalic and atraumatic.     Mouth/Throat:     Pharynx: No oropharyngeal exudate.  Eyes:     Conjunctiva/sclera: Conjunctivae normal.     Pupils: Pupils are equal, round, and reactive to light.  Cardiovascular:     Rate and Rhythm: Normal rate and regular rhythm.     Heart sounds: Normal heart sounds.  Pulmonary:     Effort: Pulmonary effort is normal.     Breath sounds: Normal breath sounds.  Abdominal:     General: Bowel sounds are normal.     Palpations: Abdomen is soft.  Genitourinary:    Exam position: Knee-chest position.     Urethra: Prolapse present.     Comments: Scant blood noted during vaginal exam however unable to do full pelvic bc she was unable to tolerate.  Musculoskeletal:     Cervical back: Normal range of motion and neck supple.     Right lower leg: No edema.     Left lower leg: No edema.  Skin:    General: Skin is warm and dry.  Neurological:     Mental Status: She is alert and oriented to person, place, and time.     Labs reviewed: Basic Metabolic Panel: Recent Labs    06/15/19 1409 06/15/19 1409 06/27/19 2046 06/27/19 2046 06/27/19 2057 06/28/19 0746 07/27/19 1422  NA 132*   < > 127*   < > 126* 130* 133*  K 3.6   < > 4.6   < > 4.2 3.5 4.2  CL 100   < > 90*   < > 91* 93* 97*  CO2 25   < > 26  --   --  29 26  GLUCOSE 142*   < > 116*   < > 113* 98 103  BUN 17   < > 19   < > 25* 13 17  CREATININE 0.83   < > 0.86   < > 0.80 0.70 0.87  CALCIUM 9.2   < > 9.0  --   --  8.8* 10.3  MG 1.9   --  2.1  --   --   --   --    < > = values in this interval not displayed.   Liver Function Tests: Recent Labs    12/29/18 1442 06/27/19 2046  AST 25 33  ALT 22 10  ALKPHOS 38 42  BILITOT 0.6 0.8  PROT 6.9 6.4*  ALBUMIN 3.8 3.5   No results for input(s): LIPASE, AMYLASE in the last 8760 hours. No results for input(s): AMMONIA in the last 8760 hours. CBC: Recent Labs    12/29/18 1442 01/06/19 0241 06/27/19 2046 06/27/19 2046 06/27/19 2057 06/28/19 0746 07/27/19 1422  WBC 6.0   < > 11.2*  --   --  7.0 7.1  NEUTROABS 3.8  --  9.0*  --   --   --  5,211  HGB 12.4   < > 13.2   < > 14.3 11.9* 12.7  HCT 39.4   < > 40.3   < > 42.0 36.3 39.7  MCV 93.4   < > 89.4  --   --  88.3 90.6  PLT 334   < > 429*  --   --  398 477*   < > = values in this interval not displayed.   Lipid Panel: No results for input(s): CHOL, HDL, LDLCALC, TRIG, CHOLHDL, LDLDIRECT in the last 8760 hours. TSH: No  results for input(s): TSH in the last 8760 hours. A1C: No results found for: HGBA1C   Assessment/Plan 1. Gross hematuria -1 episode noted. No CVA tenderness, pain on urination or changes in frequency or urgency.  - POC Urinalysis Dipstick positive for blood, will send for culture, if normal will need urology consultation, if abnormal will treat and then need to follow up culture to ensure infection and blood has resolved.  -continue to maintain proper hydration.  - Urine Culture - CBC with Differential/Platelet - BASIC METABOLIC PANEL WITH GFR  Next appt: 01/27/2020 as scheduled. Sooner if needed  [employer] [place], [facility] [phone]

## 2019-11-20 LAB — CBC WITH DIFFERENTIAL/PLATELET
Absolute Monocytes: 620 cells/uL (ref 200–950)
Basophils Absolute: 40 cells/uL (ref 0–200)
Basophils Relative: 0.6 %
Eosinophils Absolute: 172 cells/uL (ref 15–500)
Eosinophils Relative: 2.6 %
HCT: 37.9 % (ref 35.0–45.0)
Hemoglobin: 12.5 g/dL (ref 11.7–15.5)
Lymphs Abs: 1010 cells/uL (ref 850–3900)
MCH: 29.4 pg (ref 27.0–33.0)
MCHC: 33 g/dL (ref 32.0–36.0)
MCV: 89.2 fL (ref 80.0–100.0)
MPV: 8.8 fL (ref 7.5–12.5)
Monocytes Relative: 9.4 %
Neutro Abs: 4759 cells/uL (ref 1500–7800)
Neutrophils Relative %: 72.1 %
Platelets: 416 10*3/uL — ABNORMAL HIGH (ref 140–400)
RBC: 4.25 10*6/uL (ref 3.80–5.10)
RDW: 12.4 % (ref 11.0–15.0)
Total Lymphocyte: 15.3 %
WBC: 6.6 10*3/uL (ref 3.8–10.8)

## 2019-11-20 LAB — URINE CULTURE
MICRO NUMBER:: 11073350
SPECIMEN QUALITY:: ADEQUATE

## 2019-11-20 LAB — BASIC METABOLIC PANEL WITH GFR
BUN: 18 mg/dL (ref 7–25)
CO2: 27 mmol/L (ref 20–32)
Calcium: 10 mg/dL (ref 8.6–10.4)
Chloride: 93 mmol/L — ABNORMAL LOW (ref 98–110)
Creat: 0.82 mg/dL (ref 0.60–0.88)
GFR, Est African American: 78 mL/min/{1.73_m2} (ref >=60)
GFR, Est Non African American: 67 mL/min/{1.73_m2} (ref >=60)
Glucose, Bld: 86 mg/dL (ref 65–99)
Potassium: 4.8 mmol/L (ref 3.5–5.3)
Sodium: 130 mmol/L — ABNORMAL LOW (ref 135–146)

## 2019-11-23 ENCOUNTER — Telehealth: Payer: Self-pay | Admitting: *Deleted

## 2019-11-23 ENCOUNTER — Other Ambulatory Visit: Payer: Self-pay | Admitting: Nurse Practitioner

## 2019-11-23 DIAGNOSIS — R310 Gross hematuria: Secondary | ICD-10-CM

## 2019-11-23 NOTE — Telephone Encounter (Signed)
Patient called wanting to know if you have received the U/A results. Stated that she hasn't heard what the Urine results were. Please Advise.

## 2019-11-23 NOTE — Telephone Encounter (Signed)
No UTI, urology consults was placed-- put in results notes.

## 2019-11-25 ENCOUNTER — Other Ambulatory Visit: Payer: Self-pay | Admitting: Nurse Practitioner

## 2019-11-25 ENCOUNTER — Other Ambulatory Visit: Payer: Self-pay | Admitting: Gastroenterology

## 2019-11-25 DIAGNOSIS — Z1231 Encounter for screening mammogram for malignant neoplasm of breast: Secondary | ICD-10-CM

## 2019-11-25 DIAGNOSIS — M810 Age-related osteoporosis without current pathological fracture: Secondary | ICD-10-CM

## 2019-11-25 NOTE — Telephone Encounter (Signed)
Patient requesting refill on medication "Fosamax". Medciation last filled by outside provider "Libby Maw, MD". Patient was given 12 tablets to take once a week with 1 refill. Medication pend and sent to provider [name], NP . Please Advise.

## 2019-11-26 ENCOUNTER — Encounter: Payer: Medicare Other | Admitting: Nurse Practitioner

## 2019-11-27 ENCOUNTER — Other Ambulatory Visit: Payer: Self-pay

## 2019-11-27 ENCOUNTER — Ambulatory Visit (INDEPENDENT_AMBULATORY_CARE_PROVIDER_SITE_OTHER): Payer: Medicare Other | Admitting: Podiatry

## 2019-11-27 DIAGNOSIS — M2012 Hallux valgus (acquired), left foot: Secondary | ICD-10-CM | POA: Diagnosis not present

## 2019-11-27 DIAGNOSIS — B351 Tinea unguium: Secondary | ICD-10-CM

## 2019-11-27 DIAGNOSIS — M79674 Pain in right toe(s): Secondary | ICD-10-CM | POA: Diagnosis not present

## 2019-11-27 DIAGNOSIS — M79675 Pain in left toe(s): Secondary | ICD-10-CM

## 2019-12-01 ENCOUNTER — Other Ambulatory Visit: Payer: Self-pay | Admitting: Pulmonary Disease

## 2019-12-01 ENCOUNTER — Encounter: Payer: Self-pay | Admitting: Podiatry

## 2019-12-01 MED ORDER — FLUTICASONE-SALMETEROL 250-50 MCG/DOSE IN AEPB: 1.0000 | INHALATION_SPRAY | Freq: Two times a day (BID) | RESPIRATORY_TRACT | 4 refills | Status: DC

## 2019-12-01 NOTE — Progress Notes (Signed)
Subjective:  Patient ID: Katie [name], female    DOB: [birth year],  MRN: [id number]  Chief Complaint  Patient presents with  . routine foot care    nail trim     79 y.o. female presents with the above complaint.  Patient presents with thickened elongated dystrophic toenails x10 that is causing her a lot of pain when ambulating.  She would like to have them debrided down as she is not able to do it herself.  Patient states that she is also having mild pain to the left bunion deformity.  Patient states is moderate in nature.  She would like to discuss treatment options are available mostly conservative.  She has not tried anything else.  She denies any other acute complaints  Review of Systems: Negative except as noted in the HPI. Denies N/V/F/Ch.  Past Medical History:  Diagnosis Date  . Arthritis   . Cancer [facility])    Endometrial  . Complication of anesthesia    very hard to wake up from Anesthesia- then when wakes up feels like she is going to faint or have vomiting  . Endometrial cancer [facility])    Per [facility] New Patient Packet   . Family history of adverse reaction to anesthesia    daughter has a hard time waking up from Anesthesia  . GERD (gastroesophageal reflux disease)   . Glaucoma   . History of fainting 2021   Per [facility] Patient Packet   . Hypothyroidism   . 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 PONV (postoperative nausea and vomiting)   . Thyroid disease     Current Outpatient Medications:  .  acetaminophen (TYLENOL) 500 MG tablet, Take 500 mg by mouth daily. At bedtime. , Disp: , Rfl:  .  alendronate (FOSAMAX) 70 MG tablet, TAKE 1 TABLET BY MOUTH ONCE A WEEK, WITH WATER. DO NOT LIE DOWN FOR AT LEAST 30 MINUTES AND UNTIL AFTER FIRST MEAL OF THE DAY, Disp: 12 tablet, Rfl: 1 .  bimatoprost (LUMIGAN) 0.01 % SOLN, Place 1 drop into both eyes at bedtime. Can not take Generic, Disp: , Rfl:  .  brimonidine  (ALPHAGAN P) 0.1 % SOLN, Place 1 drop into both eyes 2 (two) times daily. Can not take generic, Disp: , Rfl:  .  CALCIUM-VITAMIN D PO, Take 600 mg by mouth., Disp: , Rfl:  .  chlorthalidone (HYGROTON) 25 MG tablet, , Disp: , Rfl:  .  cholecalciferol (VITAMIN D) 25 MCG (1000 UNIT) tablet, Take 3,000 Units by mouth daily. , Disp: , Rfl:  .  cholestyramine (QUESTRAN) 4 g packet, MIX AND DRINK 1 PACKET BY MOUTH TWICE DAILY, Disp: 180 each, Rfl: 0 .  esomeprazole (NEXIUM) 20 MG capsule, Take 20 mg by mouth daily. , Disp: , Rfl:  .  ipratropium (ATROVENT) 0.03 % nasal spray, Place into both nostrils., Disp: , Rfl:  .  levothyroxine (SYNTHROID) 75 MCG tablet, TAKE 1 TABLET(75 MCG) BY MOUTH DAILY, Disp: 90 tablet, Rfl: 3 .  Polyethyl Glycol-Propyl Glycol (SYSTANE ULTRA OP), Apply 1 drop to eye as needed. Both Eyes. For dry eyes., Disp: , Rfl:  .  prednisoLONE acetate (PRED FORTE) 1 % ophthalmic suspension, Place 1 drop into the right eye 3 (three) times daily., Disp: , Rfl:  .  vitamin B-12 (CYANOCOBALAMIN) 1000 MCG tablet, Take 1,000 mcg by mouth daily., Disp: , Rfl:   Social History   Tobacco Use  Smoking Status Never  Smoker  Smokeless Tobacco Never Used    Allergies  Allergen Reactions  . Sulfa Antibiotics Rash  . Gabapentin Other (See Comments)    Depressed   . Lyrica [Pregabalin] Other (See Comments)    unknown  . Paxil [Paroxetine]     unknown   Objective:  There were no vitals filed for this visit. There is no height or weight on file to calculate BMI. Constitutional Well developed. Well nourished.  Vascular Dorsalis pedis pulses palpable bilaterally. Posterior tibial pulses palpable bilaterally. Capillary refill normal to all digits.  No cyanosis or clubbing noted. Pedal hair growth normal.  Neurologic Normal speech. Oriented to person, place, and time. Epicritic sensation to light touch grossly present bilaterally.  Dermatologic No other open wounds. No skin lesions. Nail  Exam: Pt has thick disfigured discolored nails with subungual debris noted bilateral entire nail hallux through fifth toenails bilateral lower extremity edema nonpitting in nature.  Generalized in nature.  Orthopedic:  Left hallux valgus bunion deformity noted.  It is track bound not tracking deformity.  No intra-articular pain noted of the first metatarsophalangeal joint. No visible deformities. No bony tenderness.   Radiographs: None Assessment:   1. Pain due to onychomycosis of toenails of both feet   2. Hallux abducto valgus, left    Plan:  Patient was evaluated and treated and all questions answered.  Left hallux valgus/bunion deformity -I explained to the patient the etiology of bunion deformity and various treatment options were discussed with the patient.  Given that patient only has mild pain to the area I discussed conservative treatment options which include shoe gear modification as well as protecting the bunions with protectors.  Patient states understanding and she will think about if she has not gotten any of relief with conservative treatment option we will discuss surgical management in the future.  Patient states understanding  Swelling bilateral lower extremity -I explained to patient the etiology of generalized swelling to the bilateral lower extremity.  It appears to be nonpitting swelling.  I discussed with the patient the importance of compression socks and elevation.  Patient states that she will attempt to do that.   Onychomycosis with pain  -Nails palliatively debrided as below. -Educated on self-care  Procedure: Nail Debridement Rationale: pain  Type of Debridement: manual, sharp debridement. Instrumentation: Nail nipper, rotary burr. Number of Nails: 10  Procedures and Treatment: Consent by patient was obtained for treatment procedures. The patient understood the discussion of treatment and procedures well. All questions were answered thoroughly reviewed.  Debridement of mycotic and hypertrophic toenails, 1 through 5 bilateral and clearing of subungual debris. No ulceration, no infection noted.  Return Visit-Office Procedure: Patient instructed to return to the office for a follow up visit 3 months for continued evaluation and treatment.  Boneta Lucks, DPM    No follow-ups on file.   No follow-ups on file.

## 2019-12-03 ENCOUNTER — Encounter: Payer: Medicare Other | Admitting: Nurse Practitioner

## 2019-12-03 ENCOUNTER — Other Ambulatory Visit: Payer: Self-pay

## 2019-12-03 ENCOUNTER — Telehealth: Payer: Self-pay | Admitting: Nurse Practitioner

## 2019-12-03 NOTE — Progress Notes (Signed)
err

## 2019-12-03 NOTE — Telephone Encounter (Signed)
Attempted several times to contact patient however unable to contact, appt has been rescheduled for 12/04/19

## 2019-12-03 NOTE — Telephone Encounter (Signed)
Patient called regarding her telephone visit.  She stated that she did not hear the phone ring.  I talked with Janett Billow and she said that y'all would have to call patient back today for the appointment.  Patient was advised of this.  Thank you  [employer]

## 2019-12-03 NOTE — Progress Notes (Deleted)
Careteam: Patient Care Team: [name], NP as PCP - General (Geriatric Medicine) Alda Berthold, DO as Consulting Physician (Neurology) Gaynelle Arabian, MD as Consulting Physician (Orthopedic Surgery) Felipa Furnace, DPM as Consulting Physician (Podiatry) Renne Crigler, MD as Referring Physician (Obstetrics and Gynecology) Jola Schmidt, MD as Consulting Physician (Ophthalmology)  Advanced Directive information    Allergies  Allergen Reactions  . Sulfa Antibiotics Rash  . Gabapentin Other (See Comments)    Depressed   . Lyrica [Pregabalin] Other (See Comments)    unknown  . Paxil [Paroxetine]     unknown    Chief Complaint  Patient presents with  . Acute Visit    No appetite and nausea when she eats. Patient states that she may be depressed.  Current symptoms have been going on since Tuesday. She has not been doing her exercises. She has not had a fever.      HPI: Patient is a 79 y.o. female due to nausea for 2 days.     Review of Systems:  ROS***  Past Medical History:  Diagnosis Date  . Arthritis   . Cancer [facility])    Endometrial  . Complication of anesthesia    very hard to wake up from Anesthesia- then when wakes up feels like she is going to faint or have vomiting  . Endometrial cancer [facility])    Per [facility] New Patient Packet   . Family history of adverse reaction to anesthesia    daughter has a hard time waking up from Anesthesia  . GERD (gastroesophageal reflux disease)   . Glaucoma   . History of fainting 2021   Per [facility] Patient Packet   . Hypothyroidism   . 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 PONV (postoperative nausea and vomiting)   . Thyroid disease    Past Surgical History:  Procedure Laterality Date  . ABDOMINAL HYSTERECTOMY    . APPENDECTOMY  1958  . BREAST EXCISIONAL BIOPSY Right    benign, was a cyst  . BREAST SURGERY Left    lumpectomy in 1989  .  CATARACT EXTRACTION  04/29/2019  . CATARACT EXTRACTION Right 09/2019  . CHOLECYSTECTOMY    . FEMUR SURGERY  06/2013   Dr.Suarez   . ROTATOR CUFF REPAIR  2003   Per [facility] Patient Packet, Dr. Gwenlyn Perking   . 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 She has never used smokeless tobacco. She reports that she does not drink alcohol and does not use drugs.  Family History  Problem Relation Age of Onset  . Arthritis Mother   . Osteoporosis Mother   . Heart disease Father   . Hearing loss Maternal Grandmother   . Hearing loss Maternal Grandfather   . Cancer Paternal Grandfather   . Cancer Sister     Medications: Patient's Medications  New Prescriptions   No medications on file  Previous Medications   ACETAMINOPHEN (TYLENOL) 500 MG TABLET    Take 1,000 mg by mouth daily. At bedtime.    ALENDRONATE (FOSAMAX) 70 MG TABLET    TAKE 1 TABLET BY MOUTH ONCE A WEEK, WITH WATER. DO NOT LIE DOWN FOR AT LEAST 30 MINUTES AND UNTIL AFTER FIRST MEAL OF THE DAY  BIMATOPROST (LUMIGAN) 0.01 % SOLN    Place 1 drop into both eyes at bedtime. Can not take Generic   BRIMONIDINE (ALPHAGAN P) 0.1 % SOLN    Place 1 drop into both eyes 2 (two) times daily. Can not take generic   CALCIUM-VITAMIN D PO    Take 600 mg by mouth.   CHOLECALCIFEROL (VITAMIN D) 25 MCG (1000 UNIT) TABLET    Take 3,000 Units by mouth daily.    CHOLESTYRAMINE (QUESTRAN) 4 G PACKET    MIX AND DRINK 1 PACKET BY MOUTH TWICE DAILY   ESOMEPRAZOLE (NEXIUM) 20 MG CAPSULE    Take 20 mg by mouth daily.    LEVOTHYROXINE (SYNTHROID) 75 MCG TABLET    TAKE 1 TABLET(75 MCG) BY MOUTH DAILY   LORATADINE (CLARITIN) 10 MG TABLET    Take 10 mg by mouth daily.   POLYETHYL GLYCOL-PROPYL GLYCOL (SYSTANE ULTRA OP)    Apply 1 drop to eye as needed. Both Eyes. For dry eyes.    PREDNISOLONE ACETATE (PRED FORTE) 1 % OPHTHALMIC SUSPENSION    Place 1 drop into the right eye 3 (three) times daily.   VITAMIN B-12 (CYANOCOBALAMIN) 1000 MCG TABLET    Take 1,000 mcg by mouth daily.  Modified Medications   No medications on file  Discontinued Medications   CHLORTHALIDONE (HYGROTON) 25 MG TABLET       FLUTICASONE-SALMETEROL (WIXELA INHUB) 250-50 MCG/DOSE AEPB    Inhale 1 puff into the lungs every 12 (twelve) hours.   IPRATROPIUM (ATROVENT) 0.03 % NASAL SPRAY    Place into both nostrils.    Physical Exam:  There were no vitals filed for this visit. There is no height or weight on file to calculate BMI. Wt Readings from Last 3 Encounters:  11/19/19 137 lb (62.1 kg)  11/02/19 136 lb 3.2 oz (61.8 kg)  10/26/19 137 lb (62.1 kg)    Physical Exam***  Labs reviewed: Basic Metabolic Panel: Recent Labs    06/15/19 1409 06/15/19 1409 06/27/19 2046 06/27/19 2057 06/28/19 0746 07/27/19 1422 11/19/19 1414  NA 132*   < > 127*   < > 130* 133* 130*  K 3.6   < > 4.6   < > 3.5 4.2 4.8  CL 100   < > 90*   < > 93* 97* 93*  CO2 25   < > 26   < > 29 26 27   GLUCOSE 142*   < > 116*   < > 98 103 86  BUN 17   < > 19   < > 13 17 18   CREATININE 0.83   < > 0.86   < > 0.70 0.87 0.82  CALCIUM 9.2   < > 9.0   < > 8.8* 10.3 10.0  MG 1.9  --  2.1  --   --   --   --    < > = values in this interval not displayed.   Liver Function Tests: Recent Labs    12/29/18 1442 06/27/19 2046  AST 25 33  ALT 22 10  ALKPHOS 38 42  BILITOT 0.6 0.8  PROT 6.9 6.4*  ALBUMIN 3.8 3.5   No results for input(s): LIPASE, AMYLASE in the last 8760 hours. No results for input(s): AMMONIA in the last 8760 hours. CBC: Recent Labs    06/27/19 2046 06/27/19 2057 06/28/19 0746 07/27/19 1422 11/19/19 1414  WBC 11.2*   < > 7.0 7.1 6.6  NEUTROABS 9.0*  --   --  5,211 5,993  HGB 13.2   < > 11.9* 12.7 12.5  HCT 40.3   < > 36.3 39.7 37.9  MCV 89.4   < > 88.3 90.6 89.2  PLT 429*   < > 398 477* 416*   <  > = values in this interval not displayed.   Lipid Panel: No results for input(s): CHOL, HDL, LDLCALC, TRIG, CHOLHDL, LDLDIRECT in the last 8760 hours. TSH: No results for input(s): TSH in the last 8760 hours. A1C: No results found for: HGBA1C   Assessment/Plan There are no diagnoses linked to this encounter.  Next appt: *** Anny Sayler K. [place], [facility] [phone]    Virtual Visit via ***  I connected with patient on 12/03/19 at  1:30 PM EDT by *** and verified that I am speaking with the correct person using two identifiers.  Location: Patient: *** Provider: ***   I discussed the limitations, risks, security and privacy concerns of performing an evaluation and management service by telephone and the availability of in person appointments. I also discussed with the patient that there may be a patient responsible charge related to this service. The patient expressed understanding and agreed to proceed.   I discussed the assessment and treatment plan with the patient. The patient was provided an opportunity to ask questions and all were answered. The patient agreed with the plan and demonstrated an understanding of the instructions.   The patient was advised to call back or seek an in-person evaluation if the symptoms worsen or if the condition fails to improve as anticipated.  I provided *** minutes of non-face-to-face time during this encounter.  Carlos American. Harle Battiest [employer] printed and mailed

## 2019-12-04 ENCOUNTER — Telehealth: Payer: Self-pay | Admitting: Nurse Practitioner

## 2019-12-04 ENCOUNTER — Encounter: Payer: Self-pay | Admitting: Nurse Practitioner

## 2019-12-04 ENCOUNTER — Other Ambulatory Visit: Payer: Self-pay

## 2019-12-04 ENCOUNTER — Ambulatory Visit (INDEPENDENT_AMBULATORY_CARE_PROVIDER_SITE_OTHER): Payer: Medicare Other | Admitting: Nurse Practitioner

## 2019-12-04 ENCOUNTER — Ambulatory Visit: Payer: Medicare Other | Admitting: Nurse Practitioner

## 2019-12-04 DIAGNOSIS — R630 Anorexia: Secondary | ICD-10-CM | POA: Diagnosis not present

## 2019-12-04 DIAGNOSIS — F320 Major depressive disorder, single episode, mild: Secondary | ICD-10-CM | POA: Diagnosis not present

## 2019-12-04 DIAGNOSIS — R310 Gross hematuria: Secondary | ICD-10-CM | POA: Diagnosis not present

## 2019-12-04 MED ORDER — MIRTAZAPINE 15 MG PO TABS: 15.0000 mg | ORAL_TABLET | Freq: Every day | ORAL | 0 refills | Status: DC

## 2019-12-04 NOTE — Telephone Encounter (Deleted)
Ms. turner, kunzman are scheduled for a virtual visit with your provider today.    Just as we do with appointments in the office, we must obtain your consent to participate.  Your consent will be active for this visit and any virtual visit you may have with one of our providers in the next 365 days.    If you have a MyChart account, I can also send a copy of this consent to you electronically.  All virtual visits are billed to your insurance company just like a traditional visit in the office.  As this is a virtual visit, video technology does not allow for your provider to perform a traditional examination.  This may limit your provider's ability to fully assess your condition.  If your provider identifies any concerns that need to be evaluated in person or the need to arrange testing such as labs, EKG, etc, we will make arrangements to do so.    Although advances in technology are sophisticated, we cannot ensure that it will always work on either your end or our end.  If the connection with a video visit is poor, we may have to switch to a telephone visit.  With either a video or telephone visit, we are not always able to ensure that we have a secure connection.   I need to obtain your verbal consent now.   Are you willing to proceed with your visit today?   {Click here.  Press F2 and choose YES or NO                  :[id number]}   Oralia Manis, CMA 12/04/2019  11:22 AM    This encounter was created in error - please disregard.

## 2019-12-04 NOTE — Telephone Encounter (Signed)
Spoke with patient, patient states she discussed medication with her daughter following visit today and would like to reconsider Jessica's recommendation.  Pharmacy on file confirmed.

## 2019-12-04 NOTE — Telephone Encounter (Signed)
[facility] called just to make sure Katie [name] was calling in the prescription they had discussed today during her phone visit. If we can call her back and let her know that we have sent it in or if were not that would be great. Thank you!

## 2019-12-04 NOTE — Progress Notes (Signed)
This service is provided via telemedicine  No vital signs collected/recorded due to the encounter was a telemedicine visit.   Location of patient (ex: home, work):  Home  Patient consents to a telephone visit:  See Telephone Note 08/31/2019  Location of the provider (ex: office, home):  [facility]  Name of any referring provider:  N/A  Names of all persons participating in the telemedicine service and their role in the encounter: Marisa Cyphers RMA, Sherrie Mustache NP, Patient  Time spent on call:  7 min

## 2019-12-04 NOTE — Telephone Encounter (Signed)
During our visit she did NOT want to be prescribed any medication, can we please clarify that she now wants to start remeron?

## 2019-12-04 NOTE — Telephone Encounter (Signed)
Patient was told when I spoke with her earlier that rx will be sent to the pharmacy. Patient will follow instructions on pill bottle

## 2019-12-04 NOTE — Telephone Encounter (Signed)
Per Josph Macho was sent to pharmacy.

## 2019-12-04 NOTE — Telephone Encounter (Signed)
This encounter was created in error - please disregard.

## 2019-12-04 NOTE — Progress Notes (Signed)
Careteam: Patient Care Team: [name], NP as PCP - General (Geriatric Medicine) Alda Berthold, DO as Consulting Physician (Neurology) Gaynelle Arabian, MD as Consulting Physician (Orthopedic Surgery) Felipa Furnace, DPM as Consulting Physician (Podiatry) Renne Crigler, MD as Referring Physician (Obstetrics and Gynecology) Jola Schmidt, MD as Consulting Physician (Ophthalmology)  Advanced Directive information Does Patient Have a Medical Advance Directive?: Yes, Does patient want to make changes to medical advance directive?: No - Patient declined  Allergies  Allergen Reactions   Sulfa Antibiotics Rash   Gabapentin Other (See Comments)    Depressed    Lyrica [Pregabalin] Other (See Comments)    unknown   Paxil [Paroxetine]     unknown    Chief Complaint  Patient presents with   Acute Visit    No Appetite and Nausea when she eats     HPI: Patient is a 79 y.o. female for telephone visit, unable to connect virtual visit.  Reports she has been having decrease appetite and nausea when she eats for 4 days.  No abdominal pain.  No fevers or chills.  No vomiting, no constipation or diarrhea.  Reports she is having to force feed herself.  She continues on nexium- no worsening GERD  Reports she is having more depression. She states that's she talked to her family and feels like she need assisted living. [facility].  Symptoms happened before talks of moving but feels like it may have made it worse.   Has appt dec 3rd due to blood in urine, no ongoing visible blood in urine.   Has a hard time getting to sleep due to frequent thoughts.   Does not want to take any medication that helps with her mind.   Reports if she eats hungry she will snack or eat some cookies and does okay with that- no nausea or "full feeling"  No new supplements.     Review of Systems:  Review of Systems  Constitutional: Positive for weight loss. Negative for  chills and fever.  Respiratory: Negative for cough, sputum production and shortness of breath.   Cardiovascular: Negative for chest pain, palpitations and leg swelling.  Gastrointestinal: Negative for abdominal pain, constipation, diarrhea, heartburn, nausea and vomiting.       Reports odd sensation after eating not truly nausea.   Genitourinary: Negative for dysuria, frequency and urgency.  Musculoskeletal: Negative for back pain, joint pain and myalgias.  Skin: Negative.   Neurological: Negative for dizziness and headaches.  Psychiatric/Behavioral: Positive for depression. The patient is nervous/anxious.     Past Medical History:  Diagnosis Date   Arthritis    Cancer [facility])    Endometrial   Complication of anesthesia    very hard to wake up from Anesthesia- then when wakes up feels like she is going to faint or have vomiting   Endometrial cancer ([place])    Per [facility] New Patient Packet    Family history of adverse reaction to anesthesia    daughter has a hard time waking up from Anesthesia   GERD (gastroesophageal reflux disease)    Glaucoma    History of fainting 2021   Per [facility] New Patient Packet    Hypothyroidism    Neuromuscular disorder ([place])    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 Gwenlyn Perking    TOTAL KNEE ARTHROPLASTY Right 01/05/2019   Procedure: TOTAL KNEE ARTHROPLASTY;  Surgeon: Gaynelle Arabian, MD;  Location: [facility] ORS;  Service: Orthopedics;  Laterality: Right;  30min    Social History:   reports that she has never smoked. She has never used smokeless tobacco. She reports that she does not drink alcohol and does not use drugs.  Family History  Problem Relation Age of Onset   Arthritis Mother    Osteoporosis Mother    Heart disease Father    Hearing loss Maternal Grandmother    Hearing loss Maternal Grandfather    Cancer Paternal Grandfather    Cancer Sister     Medications: Patient's Medications  New Prescriptions   No medications on file  Previous Medications   ACETAMINOPHEN (TYLENOL) 500 MG TABLET    Take 1,000 mg by mouth daily. At bedtime.    ALENDRONATE (FOSAMAX) 70 MG TABLET    TAKE 1 TABLET BY MOUTH ONCE A WEEK, WITH WATER. DO NOT LIE DOWN FOR AT LEAST 30 MINUTES AND UNTIL AFTER FIRST MEAL OF THE DAY   BIMATOPROST (LUMIGAN) 0.01 % SOLN    Place 1 drop into both eyes at bedtime. Can not take Generic   BRIMONIDINE (ALPHAGAN P) 0.1 % SOLN    Place 1 drop into both eyes 2 (two) times daily. Can not take generic   CALCIUM-VITAMIN D PO    Take 600 mg by mouth.   CHOLECALCIFEROL (VITAMIN D) 25 MCG (1000 UNIT) TABLET    Take 3,000 Units by mouth daily.    CHOLESTYRAMINE (QUESTRAN) 4 G PACKET    Take 4 g by mouth at bedtime.   ESOMEPRAZOLE (NEXIUM) 20 MG CAPSULE    Take 20 mg by mouth daily.    LEVOTHYROXINE (SYNTHROID) 75 MCG TABLET    TAKE 1 TABLET(75 MCG) BY MOUTH DAILY   LORATADINE (CLARITIN) 10 MG TABLET    Take 10 mg by mouth daily.   POLYETHYL GLYCOL-PROPYL GLYCOL (SYSTANE ULTRA OP)    Apply 1 drop to eye as needed. Both Eyes. For dry eyes.   VITAMIN B-12 (CYANOCOBALAMIN) 1000 MCG TABLET    Take 1,000 mcg by mouth daily.  Modified Medications   No medications on file  Discontinued Medications   CHOLESTYRAMINE (QUESTRAN) 4 G PACKET    MIX AND DRINK 1 PACKET BY MOUTH TWICE DAILY   PREDNISOLONE ACETATE (PRED FORTE) 1 % OPHTHALMIC SUSPENSION    Place 1 drop into the right eye 3 (three) times daily.    Physical Exam:  There were no  vitals filed for this visit. There is no height or weight on file to calculate BMI. Wt Readings from Last 3 Encounters:  11/19/19 137 lb (62.1 kg)  11/02/19 136 lb 3.2 oz (61.8 kg)  10/26/19 137 lb (62.1 kg)     Labs reviewed: Basic Metabolic Panel: Recent Labs    06/15/19 1409 06/15/19 1409 06/27/19 2046 06/27/19 2057 06/28/19 0746 07/27/19 1422 11/19/19 1414  NA 132*   < > 127*   < > 130* 133* 130*  K 3.6   < >  4.6   < > 3.5 4.2 4.8  CL 100   < > 90*   < > 93* 97* 93*  CO2 25   < > 26   < > 29 26 27   GLUCOSE 142*   < > 116*   < > 98 103 86  BUN 17   < > 19   < > 13 17 18   CREATININE 0.83   < > 0.86   < > 0.70 0.87 0.82  CALCIUM 9.2   < > 9.0   < > 8.8* 10.3 10.0  MG 1.9  --  2.1  --   --   --   --    < > = values in this interval not displayed.   Liver Function Tests: Recent Labs    12/29/18 1442 06/27/19 2046  AST 25 33  ALT 22 10  ALKPHOS 38 42  BILITOT 0.6 0.8  PROT 6.9 6.4*  ALBUMIN 3.8 3.5   No results for input(s): LIPASE, AMYLASE in the last 8760 hours. No results for input(s): AMMONIA in the last 8760 hours. CBC: Recent Labs    06/27/19 2046 06/27/19 2057 06/28/19 0746 07/27/19 1422 11/19/19 1414  WBC 11.2*   < > 7.0 7.1 6.6  NEUTROABS 9.0*  --   --  5,211 4,759  HGB 13.2   < > 11.9* 12.7 12.5  HCT 40.3   < > 36.3 39.7 37.9  MCV 89.4   < > 88.3 90.6 89.2  PLT 429*   < > 398 477* 416*   < > = values in this interval not displayed.   Lipid Panel: No results for input(s): CHOL, HDL, LDLCALC, TRIG, CHOLHDL, LDLDIRECT in the last 8760 hours. TSH: No results for input(s): TSH in the last 8760 hours. A1C: No results found for: HGBA1C   Assessment/Plan 1. Gross hematuria No longer having gross hematuria after initial episode but has appt with urology scheduled.   2. Decreased appetite -reports she has had decrease appetite and feelings of fullness/nausea when eating certain foods, she states she is able to eat snacky/sweet foods without this  sensation. Encouraged bland diet and to increase protein as tolerates. ?if appetite related to increase in anxiety and depression.  Symptoms have been ongoing for 4 days, plans to increase protein and eat smaller more frequent meals. Will notify if persist.   3. Depression, major, single episode, mild ([place]) Noted to have worsening anxiety and depression. -pt originally did not want to take medication however called office back and reports she would like to start remeron to help with mood, appetite and help her get to sleep.   Next appt: 01/27/2020 Carlos American. Harle Battiest  [facility] [phone]    Virtual Visit via telephone   I connected with patient on 12/04/19 at  3:45 PM EDT by telephone and verified that I am speaking with the correct person using two identifiers.  Location: Patient: home Provider: psc   I discussed the limitations, risks, security and privacy concerns of performing an evaluation and management service by telephone and the availability of in person appointments. I also discussed with the patient that there may be a patient responsible charge related to this service. The patient expressed understanding and agreed to proceed.   I discussed the assessment and treatment plan with the patient. The patient was provided an opportunity to ask questions and all were answered. The patient agreed with the plan and demonstrated an understanding of the  instructions.   The patient was advised to call back or seek an in-person evaluation if the symptoms worsen or if the condition fails to improve as anticipated.  I provided 22 minutes of non-face-to-face time during this encounter.  Carlos American. Harle Battiest Avs printed and mailed

## 2019-12-04 NOTE — Telephone Encounter (Signed)
Yes please notify pt to start remeron 15 mg by mouth daily at bedtime to help with sleep, mood and appetite. Thank you

## 2019-12-07 ENCOUNTER — Telehealth: Payer: Self-pay

## 2019-12-07 DIAGNOSIS — D649 Anemia, unspecified: Secondary | ICD-10-CM

## 2019-12-07 DIAGNOSIS — E611 Iron deficiency: Secondary | ICD-10-CM

## 2019-12-07 NOTE — Telephone Encounter (Signed)
Patient saw Janett Billow recently and was started on Remeron. She stated after reading the paperwork that came with the medication that she didn't want to take it. She has had issues with medications making her dizzy or confused in the past.   She also states she feels like she needs a GI referral. She feels like she may have a GI bleed, as her hemoglobin has been decreasing.  Patient is aware that Janett Billow is not in the office this week.   I have pended a GI referral.

## 2019-12-07 NOTE — Telephone Encounter (Signed)
Gastroenterology referral completed per patient's request for evaluation of anemia.

## 2019-12-08 NOTE — Telephone Encounter (Signed)
Will have [name] send referral to Dr.Zehr's office per patient's request.

## 2019-12-08 NOTE — Telephone Encounter (Signed)
Patient called back stating the referral was to [facility]. She has a GI doc and would like to go back there to [place] instead of [place].   Alonza Bogus [place] [phone] at [place]. She already has appointment set up for 11/16 for endoscopy but need the referral sent there.  Patient can be reached at [phone] To [place] at [place] (covering)

## 2019-12-14 ENCOUNTER — Encounter: Payer: Self-pay | Admitting: Nurse Practitioner

## 2019-12-14 ENCOUNTER — Other Ambulatory Visit: Payer: Self-pay

## 2019-12-14 ENCOUNTER — Inpatient Hospital Stay (HOSPITAL_COMMUNITY)
Admission: EM | Admit: 2019-12-14 | Discharge: 2019-12-24 | DRG: 166 | Disposition: A | Discharge status: Home / Self Care | Payer: Medicare Other | Attending: Internal Medicine | Admitting: Internal Medicine

## 2019-12-14 ENCOUNTER — Ambulatory Visit (INDEPENDENT_AMBULATORY_CARE_PROVIDER_SITE_OTHER): Payer: Medicare Other | Admitting: Nurse Practitioner

## 2019-12-14 VITALS — BP 132/80 | HR 97 | Temp 97.1°F | Ht 60.0 in | Wt 133.0 lb

## 2019-12-14 DIAGNOSIS — E611 Iron deficiency: Secondary | ICD-10-CM | POA: Diagnosis present

## 2019-12-14 DIAGNOSIS — Z8262 Family history of osteoporosis: Secondary | ICD-10-CM

## 2019-12-14 DIAGNOSIS — C541 Malignant neoplasm of endometrium: Secondary | ICD-10-CM

## 2019-12-14 DIAGNOSIS — K2100 Gastro-esophageal reflux disease with esophagitis, without bleeding: Secondary | ICD-10-CM | POA: Diagnosis present

## 2019-12-14 DIAGNOSIS — L659 Nonscarring hair loss, unspecified: Secondary | ICD-10-CM | POA: Diagnosis present

## 2019-12-14 DIAGNOSIS — R627 Adult failure to thrive: Secondary | ICD-10-CM | POA: Diagnosis not present

## 2019-12-14 DIAGNOSIS — Z66 Do not resuscitate: Secondary | ICD-10-CM | POA: Diagnosis not present

## 2019-12-14 DIAGNOSIS — Z923 Personal history of irradiation: Secondary | ICD-10-CM

## 2019-12-14 DIAGNOSIS — E222 Syndrome of inappropriate secretion of antidiuretic hormone: Secondary | ICD-10-CM | POA: Diagnosis present

## 2019-12-14 DIAGNOSIS — J9811 Atelectasis: Secondary | ICD-10-CM | POA: Diagnosis not present

## 2019-12-14 DIAGNOSIS — I952 Hypotension due to drugs: Secondary | ICD-10-CM | POA: Diagnosis not present

## 2019-12-14 DIAGNOSIS — Z20822 Contact with and (suspected) exposure to covid-19: Secondary | ICD-10-CM | POA: Diagnosis not present

## 2019-12-14 DIAGNOSIS — Z9842 Cataract extraction status, left eye: Secondary | ICD-10-CM

## 2019-12-14 DIAGNOSIS — R634 Abnormal weight loss: Secondary | ICD-10-CM | POA: Diagnosis present

## 2019-12-14 DIAGNOSIS — Z7189 Other specified counseling: Secondary | ICD-10-CM

## 2019-12-14 DIAGNOSIS — C7801 Secondary malignant neoplasm of right lung: Secondary | ICD-10-CM | POA: Diagnosis not present

## 2019-12-14 DIAGNOSIS — Z90710 Acquired absence of both cervix and uterus: Secondary | ICD-10-CM

## 2019-12-14 DIAGNOSIS — E861 Hypovolemia: Secondary | ICD-10-CM | POA: Diagnosis present

## 2019-12-14 DIAGNOSIS — J90 Pleural effusion, not elsewhere classified: Secondary | ICD-10-CM

## 2019-12-14 DIAGNOSIS — Z96651 Presence of right artificial knee joint: Secondary | ICD-10-CM | POA: Diagnosis present

## 2019-12-14 DIAGNOSIS — R269 Unspecified abnormalities of gait and mobility: Secondary | ICD-10-CM | POA: Diagnosis present

## 2019-12-14 DIAGNOSIS — M810 Age-related osteoporosis without current pathological fracture: Secondary | ICD-10-CM | POA: Diagnosis present

## 2019-12-14 DIAGNOSIS — R059 Cough, unspecified: Secondary | ICD-10-CM

## 2019-12-14 DIAGNOSIS — Z9889 Other specified postprocedural states: Secondary | ICD-10-CM

## 2019-12-14 DIAGNOSIS — R1310 Dysphagia, unspecified: Secondary | ICD-10-CM | POA: Diagnosis not present

## 2019-12-14 DIAGNOSIS — J9601 Acute respiratory failure with hypoxia: Secondary | ICD-10-CM | POA: Diagnosis not present

## 2019-12-14 DIAGNOSIS — E869 Volume depletion, unspecified: Secondary | ICD-10-CM | POA: Diagnosis present

## 2019-12-14 DIAGNOSIS — E039 Hypothyroidism, unspecified: Secondary | ICD-10-CM | POA: Diagnosis present

## 2019-12-14 DIAGNOSIS — D75839 Thrombocytosis, unspecified: Secondary | ICD-10-CM | POA: Diagnosis present

## 2019-12-14 DIAGNOSIS — J910 Malignant pleural effusion: Secondary | ICD-10-CM | POA: Diagnosis not present

## 2019-12-14 DIAGNOSIS — T502X5A Adverse effect of carbonic-anhydrase inhibitors, benzothiadiazides and other diuretics, initial encounter: Secondary | ICD-10-CM | POA: Diagnosis not present

## 2019-12-14 DIAGNOSIS — Z888 Allergy status to other drugs, medicaments and biological substances status: Secondary | ICD-10-CM

## 2019-12-14 DIAGNOSIS — K5750 Diverticulosis of both small and large intestine without perforation or abscess without bleeding: Secondary | ICD-10-CM | POA: Diagnosis present

## 2019-12-14 DIAGNOSIS — K529 Noninfective gastroenteritis and colitis, unspecified: Secondary | ICD-10-CM | POA: Diagnosis present

## 2019-12-14 DIAGNOSIS — Z7983 Long term (current) use of bisphosphonates: Secondary | ICD-10-CM

## 2019-12-14 DIAGNOSIS — K317 Polyp of stomach and duodenum: Secondary | ICD-10-CM

## 2019-12-14 DIAGNOSIS — R531 Weakness: Secondary | ICD-10-CM

## 2019-12-14 DIAGNOSIS — G620 Drug-induced polyneuropathy: Secondary | ICD-10-CM | POA: Diagnosis present

## 2019-12-14 DIAGNOSIS — Z882 Allergy status to sulfonamides status: Secondary | ICD-10-CM

## 2019-12-14 DIAGNOSIS — D630 Anemia in neoplastic disease: Secondary | ICD-10-CM | POA: Diagnosis present

## 2019-12-14 DIAGNOSIS — Z8542 Personal history of malignant neoplasm of other parts of uterus: Secondary | ICD-10-CM

## 2019-12-14 DIAGNOSIS — Z8249 Family history of ischemic heart disease and other diseases of the circulatory system: Secondary | ICD-10-CM

## 2019-12-14 DIAGNOSIS — H409 Unspecified glaucoma: Secondary | ICD-10-CM | POA: Diagnosis present

## 2019-12-14 DIAGNOSIS — I10 Essential (primary) hypertension: Secondary | ICD-10-CM | POA: Diagnosis present

## 2019-12-14 DIAGNOSIS — Z9049 Acquired absence of other specified parts of digestive tract: Secondary | ICD-10-CM

## 2019-12-14 DIAGNOSIS — E860 Dehydration: Secondary | ICD-10-CM | POA: Diagnosis present

## 2019-12-14 DIAGNOSIS — Z8261 Family history of arthritis: Secondary | ICD-10-CM

## 2019-12-14 DIAGNOSIS — Z79890 Hormone replacement therapy (postmenopausal): Secondary | ICD-10-CM

## 2019-12-14 DIAGNOSIS — Z79899 Other long term (current) drug therapy: Secondary | ICD-10-CM

## 2019-12-14 DIAGNOSIS — T451X5A Adverse effect of antineoplastic and immunosuppressive drugs, initial encounter: Secondary | ICD-10-CM | POA: Diagnosis present

## 2019-12-14 LAB — BASIC METABOLIC PANEL
Anion gap: 14 (ref 5–15)
BUN: 15 mg/dL (ref 8–23)
CO2: 24 mmol/L (ref 22–32)
Calcium: 9.6 mg/dL (ref 8.9–10.3)
Chloride: 95 mmol/L — ABNORMAL LOW (ref 98–111)
Creatinine, Ser: 0.72 mg/dL (ref 0.44–1.00)
GFR, Estimated: >60 mL/min (ref >60)
Glucose, Bld: 97 mg/dL (ref 70–99)
Potassium: 4 mmol/L (ref 3.5–5.1)
Sodium: 133 mmol/L — ABNORMAL LOW (ref 135–145)

## 2019-12-14 LAB — LIPASE, BLOOD: Lipase: 24 U/L (ref 11–51)

## 2019-12-14 LAB — URINALYSIS, ROUTINE W REFLEX MICROSCOPIC
Bilirubin Urine: NEGATIVE
Glucose, UA: NEGATIVE mg/dL
Hgb urine dipstick: NEGATIVE
Ketones, ur: 80 mg/dL — AB
Nitrite: NEGATIVE
Protein, ur: 30 mg/dL — AB
Specific Gravity, Urine: 1.026 (ref 1.005–1.030)
pH: 5 (ref 5.0–8.0)

## 2019-12-14 LAB — HEPATIC FUNCTION PANEL
ALT: 13 U/L (ref 0–44)
AST: 16 U/L (ref 15–41)
Albumin: 3.6 g/dL (ref 3.5–5.0)
Alkaline Phosphatase: 49 U/L (ref 38–126)
Bilirubin, Direct: 0.1 mg/dL (ref 0.0–0.2)
Indirect Bilirubin: 0.8 mg/dL (ref 0.3–0.9)
Total Bilirubin: 0.9 mg/dL (ref 0.3–1.2)
Total Protein: 7.3 g/dL (ref 6.5–8.1)

## 2019-12-14 LAB — CBC
HCT: 37.4 % (ref 36.0–46.0)
Hemoglobin: 11.8 g/dL — ABNORMAL LOW (ref 12.0–15.0)
MCH: 28.4 pg (ref 26.0–34.0)
MCHC: 31.6 g/dL (ref 30.0–36.0)
MCV: 90.1 fL (ref 80.0–100.0)
Platelets: 543 10*3/uL — ABNORMAL HIGH (ref 150–400)
RBC: 4.15 MIL/uL (ref 3.87–5.11)
RDW: 13 % (ref 11.5–15.5)
WBC: 7.5 10*3/uL (ref 4.0–10.5)
nRBC: 0 % (ref 0.0–0.2)

## 2019-12-14 LAB — RESPIRATORY PANEL BY RT PCR (FLU A&B, COVID)
Influenza A by PCR: NEGATIVE
Influenza B by PCR: NEGATIVE
SARS Coronavirus 2 by RT PCR: NEGATIVE

## 2019-12-14 LAB — CBG MONITORING, ED: Glucose-Capillary: 83 mg/dL (ref 70–99)

## 2019-12-14 MED ORDER — SODIUM CHLORIDE 0.9 % IV BOLUS
1000.0000 mL | Freq: Once | INTRAVENOUS | Status: AC
  Administered 2019-12-14: 1000 mL via INTRAVENOUS

## 2019-12-14 MED ORDER — SODIUM CHLORIDE 0.9 % IV SOLN: INTRAVENOUS | Status: DC

## 2019-12-14 NOTE — ED Provider Notes (Signed)
[facility] DEPT Provider Note   CSN: [id number] Arrival date & time: 12/14/19  1636     History Chief Complaint  Patient presents with  . Weakness  . Dysphagia    Katie [name] is a 79 y.o. female.  79 year old female who presents with 2 weeks of difficulty swallowing.  Patient states that over the last several days is gotten worse to the point where she can only swallow small sips.  Patient states that she has had increased weakness.  Denies any urinary symptoms.  Weakness has been nonfocal.  Denies any emesis.  No abdominal discomfort.  Has an appointment with GI in the future.  Saw her doctor today for this complaint and concern for dehydration and patient sent here.  She denies any prior history of esophageal pathology.        Past Medical History:  Diagnosis Date  . Arthritis   . Cancer [facility])    Endometrial  . Complication of anesthesia    very hard to wake up from Anesthesia- then when wakes up feels like she is going to faint or have vomiting  . Endometrial cancer [facility])    Per [facility] New Patient Packet   . Family history of adverse reaction to anesthesia    daughter has a hard time waking up from Anesthesia  . GERD (gastroesophageal reflux disease)   . Glaucoma   . History of fainting 2021   Per [place] Patient Packet   . Hypothyroidism   . 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 PONV (postoperative nausea and vomiting)   . Thyroid disease     Patient Active Problem List   Diagnosis Date Noted  . PVD (peripheral vascular disease) ([place]) 11/05/2019  . Syncope and collapse 06/27/2019  . Reactive airway disease 06/15/2019  . Muscle cramps 06/15/2019  . Essential hypertension 06/15/2019  . Elevated BP without diagnosis of hypertension 05/29/2019  . Stress and adjustment reaction 05/29/2019  . Iron deficiency 02/23/2019  . B12 deficiency 02/23/2019  . Hospital  discharge follow-up 02/20/2019  . Anemia 02/20/2019  . 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 Vitamin D deficiency 09/15/2018  . Pain due to onychomycosis of toenails of both feet 08/05/2018  . Osteoporosis without current pathological fracture 03/17/2018  . Healthcare maintenance 03/17/2018  . Elevated LDL cholesterol level 03/17/2018  . History of total hip arthroplasty, left 01/23/2018  . Cellulitis of right upper extremity 09/26/2017  . Left flank pain 08/26/2017  . OA (osteoarthritis) of knee 06/03/2017  . Loose stools 05/30/2017  . Chronic diarrhea 05/30/2017  . 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 Osteoarthritis of right knee 05/27/2017  . Labyrinthitis 04/18/2017  . Need for assistance due to unsteady gait 04/18/2017  . Age-related osteoporosis without current pathological fracture 01/23/2017  . Acquired hypothyroidism 01/23/2017  . History of endometrial cancer 01/23/2017    Past Surgical History:  Procedure Laterality Date  . ABDOMINAL HYSTERECTOMY    . APPENDECTOMY  1958  . BREAST EXCISIONAL BIOPSY Right    benign, was a cyst  . BREAST SURGERY Left    lumpectomy in 1989  . CATARACT EXTRACTION  04/29/2019  . CATARACT EXTRACTION Right 09/2019  . CHOLECYSTECTOMY    . FEMUR SURGERY  06/2013   Dr.Suarez   . ROTATOR CUFF REPAIR  2003   Per [facility] Patient Packet, Dr. Gwenlyn Perking   . TOTAL KNEE ARTHROPLASTY Right 01/05/2019   Procedure: TOTAL  KNEE ARTHROPLASTY;  Surgeon: Gaynelle Arabian, MD;  Location: [facility];  Service: Orthopedics;  Laterality: Right;  39min     OB History    Gravida  2   Para  2   Term  2   Preterm      AB      Living  2     SAB      TAB      Ectopic      Multiple      Live Births  2           Family History  Problem Relation Age of Onset  . Arthritis Mother   . Osteoporosis Mother   . Heart disease Father   . Hearing loss Maternal Grandmother   . Hearing loss Maternal Grandfather   . Cancer Paternal  Grandfather   . Cancer Sister     Social History   Tobacco Use  . Smoking status: Never Smoker  . Smokeless tobacco: Never Used  Vaping Use  . Vaping Use: Never used  Substance Use Topics  . Alcohol use: No  . Drug use: No    Home Medications Prior to Admission medications   Medication Sig Start Date End Date Taking? Authorizing Provider  acetaminophen (TYLENOL) 500 MG tablet Take 1,000 mg by mouth daily. At bedtime.     [provider]  alendronate (FOSAMAX) 70 MG tablet TAKE 1 TABLET BY MOUTH ONCE A WEEK, WITH WATER. DO NOT LIE DOWN FOR AT LEAST 30 MINUTES AND UNTIL AFTER FIRST MEAL OF THE DAY 11/25/19   [name], NP  bimatoprost (LUMIGAN) 0.01 % SOLN Place 1 drop into both eyes at bedtime. Can not take Generic    [provider]  brimonidine (ALPHAGAN P) 0.1 % SOLN Place 1 drop into both eyes 2 (two) times daily. Can not take generic    [provider]  CALCIUM-VITAMIN D PO Take 600 mg by mouth.    [provider]  cholecalciferol (VITAMIN D) 25 MCG (1000 UNIT) tablet Take 3,000 Units by mouth daily.     [provider]  cholestyramine (QUESTRAN) 4 g packet Take 4 g by mouth at bedtime.    [provider]  esomeprazole (NEXIUM) 20 MG capsule Take 20 mg by mouth daily.     [provider]  levothyroxine (SYNTHROID) 75 MCG tablet TAKE 1 TABLET(75 MCG) BY MOUTH DAILY 03/09/19   Libby Maw, MD  loratadine (CLARITIN) 10 MG tablet Take 10 mg by mouth daily.    [provider]  Polyethyl Glycol-Propyl Glycol (SYSTANE ULTRA OP) Apply 1 drop to eye as needed. Both Eyes. For dry eyes.    [provider]  vitamin B-12 (CYANOCOBALAMIN) 1000 MCG tablet Take 1,000 mcg by mouth daily.    [provider]    Allergies    Sulfa antibiotics, Gabapentin, Lyrica [pregabalin], and Paxil [paroxetine]  Review of Systems   Review of Systems  All other systems reviewed and are  negative.   Physical Exam Updated Vital Signs BP (!) 170/90 (BP Location: Left Arm)   Pulse 91   Temp 98.5 F (36.9 C) (Oral)   Resp (!) 31   Ht 1.524 m (5')   Wt 60.3 kg   SpO2 93%   BMI 25.97 kg/m   Physical Exam Vitals and nursing note reviewed.  Constitutional:      General: She is not in acute distress.    Appearance: Normal appearance. She is well-developed. She  is not toxic-appearing.  HENT:     Head: Normocephalic and atraumatic.  Eyes:     General: Lids are normal.     Conjunctiva/sclera: Conjunctivae normal.     Pupils: Pupils are equal, round, and reactive to light.  Neck:     Thyroid: No thyroid mass.     Trachea: No tracheal deviation.  Cardiovascular:     Rate and Rhythm: Normal rate and regular rhythm.     Heart sounds: Normal heart sounds. No murmur heard.  No gallop.   Pulmonary:     Effort: Pulmonary effort is normal. No respiratory distress.     Breath sounds: Normal breath sounds. No stridor. No decreased breath sounds, wheezing, rhonchi or rales.  Abdominal:     General: Bowel sounds are normal. There is no distension.     Palpations: Abdomen is soft.     Tenderness: There is no abdominal tenderness. There is no rebound.  Musculoskeletal:        General: No tenderness. Normal range of motion.     Cervical back: Normal range of motion and neck supple.  Skin:    General: Skin is warm and dry.     Findings: No abrasion or rash.  Neurological:     Mental Status: She is alert and oriented to person, place, and time.     GCS: GCS eye subscore is 4. GCS verbal subscore is 5. GCS motor subscore is 6.     Cranial Nerves: No cranial nerve deficit.     Sensory: No sensory deficit.  Psychiatric:        Speech: Speech normal.        Behavior: Behavior normal.     ED Results / Procedures / Treatments   Labs (all labs ordered are listed, but only abnormal results are displayed) Labs Reviewed  BASIC METABOLIC PANEL - Abnormal; Notable for the  following components:      Result Value   Sodium 133 (*)    Chloride 95 (*)    All other components within normal limits  CBC - Abnormal; Notable for the following components:   Hemoglobin 11.8 (*)    Platelets 543 (*)    All other components within normal limits  URINALYSIS, ROUTINE W REFLEX MICROSCOPIC  LIPASE, BLOOD  HEPATIC FUNCTION PANEL  CBG MONITORING, ED    EKG None  Radiology No results found.  Procedures Procedures (including critical care time)  Medications Ordered in ED Medications  sodium chloride 0.9 % bolus 1,000 mL (has no administration in time range)  0.9 %  sodium chloride infusion (has no administration in time range)    ED Course  I have reviewed the triage vital signs and the nursing notes.  Pertinent labs & imaging results that were available during my care of the patient were reviewed by me and considered in my medical decision making (see chart for details).    MDM Rules/Calculators/A&P                          Patient with evidence of dehydration here.  Discussed with Dr. Lorenza Chick from GI, agrees with patient being admitted for GI consultation in the morning.  Will consult hospitalist for admission Final Clinical Impression(s) / ED Diagnoses Final diagnoses:  None    Rx / DC Orders ED Discharge Orders    None       Lacretia Leigh, MD 12/14/19 2153

## 2019-12-14 NOTE — ED Triage Notes (Signed)
Patient reports to the ER for inability to eat. Patient's daughter is concerned about patient having a stricture. Patient reports she has been feeling lethargy due to lack of intake. Patient is alert and oriented at this time.

## 2019-12-14 NOTE — H&P (Addendum)
History and Physical  [name] DVV:[id number] DOB: 03/23/1940 DOA: 12/14/2019  Referring physician: Dr. Zenia Resides PCP: [name], NP  Outpatient Specialists: Heme oncology, GI. Patient coming from: Home.  Lives alone.  Chief Complaint: Generalized weakness and trouble swallowing.  HPI: Katie [name] is a 79 y.o. female with medical history significant for endometrial cancer 10 years ago with extensive surgery and radiation in 2011, cancer in remission, hypothyroidism, GERD, osteoporosis who presented to [facility] ED as recommended by her PCP due to gradually worsening generalized weakness, poor oral intake and dysphagia with solids and liquids of 2 weeks duration.  Associated with unintentional weight loss, 4 pounds in the last 2 weeks, intermittent diarrhea, nonproductive cough.  Denies any fevers.  Admits to chills, states she stays cold.  She is concerned that her cancer is recurring.  She has had difficulty with bolus of food passing down her esophagus, it takes much longer and at times sticky.  No odynophagia.  Went to her primary care provider who advised her to go to the ED for further evaluation.  Work-up in the ED revealed hypovolemia secondary to dehydration from poor oral intake.  EDP asked to admit for IV fluid hydration.  EDP consulted GI Dr. Fuller Plan.  ED Course: Afebrile, temperature 97.1, BP 156/104, heart rate 103, respiration rate 36.  Lab studies remarkable for mild anemia with hemoglobin of 11.8K and thrombocytosis 543K.  Persistent non productive cough on exam, will obtain port CXR.  Review of Systems: Review of systems as noted in the HPI. All other systems reviewed and are negative.   Past Medical History:  Diagnosis Date  . Arthritis   . Cancer [facility])    Endometrial  . Complication of anesthesia    very hard to wake up from Anesthesia- then when wakes up feels like she is going to faint or have vomiting  . Endometrial cancer [facility])    Per [facility] New Patient  Packet   . Family history of adverse reaction to anesthesia    daughter has a hard time waking up from Anesthesia  . GERD (gastroesophageal reflux disease)   . Glaucoma   . History of fainting 2021   Per [place] Patient Packet   . Hypothyroidism   . 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 PONV (postoperative nausea and vomiting)   . Thyroid disease    Past Surgical History:  Procedure Laterality Date  . ABDOMINAL HYSTERECTOMY    . APPENDECTOMY  1958  . BREAST EXCISIONAL BIOPSY Right    benign, was a cyst  . BREAST SURGERY Left    lumpectomy in 1989  . CATARACT EXTRACTION  04/29/2019  . CATARACT EXTRACTION Right 09/2019  . CHOLECYSTECTOMY    . FEMUR SURGERY  06/2013   Dr.Suarez   . ROTATOR CUFF REPAIR  2003   Per [place] Patient Packet, Dr. Gwenlyn Perking   . TOTAL KNEE ARTHROPLASTY Right 01/05/2019   Procedure: TOTAL KNEE ARTHROPLASTY;  Surgeon: Gaynelle Arabian, MD;  Location: [facility] ORS;  Service: Orthopedics;  Laterality: Right;  98min    Social History:  reports that she has never smoked. She has never used smokeless tobacco. She reports that she does not drink alcohol and does not use drugs.   Allergies  Allergen Reactions  . Sulfa Antibiotics Rash  . Gabapentin Other (See Comments)    Depressed   . Lyrica [Pregabalin] Other (See Comments)    unknown  . Paxil [  Paroxetine]     unknown    Family History  Problem Relation Age of Onset  . Arthritis Mother   . Osteoporosis Mother   . Heart disease Father   . Hearing loss Maternal Grandmother   . Hearing loss Maternal Grandfather   . Cancer Paternal Grandfather   . Cancer Sister       Prior to Admission medications   Medication Sig Start Date End Date Taking? Authorizing Provider  acetaminophen (TYLENOL) 500 MG tablet Take 1,000 mg by mouth daily. At bedtime.    Yes [provider]  alendronate (FOSAMAX) 70 MG tablet TAKE 1 TABLET BY MOUTH ONCE A  WEEK, WITH WATER. DO NOT LIE DOWN FOR AT LEAST 30 MINUTES AND UNTIL AFTER FIRST MEAL OF THE DAY Patient taking differently: Take 70 mg by mouth once a week. Take on Sundays 11/25/19  Yes [name], NP  bimatoprost (LUMIGAN) 0.01 % SOLN Place 1 drop into both eyes at bedtime. Can not take Generic   Yes [provider]  brimonidine (ALPHAGAN P) 0.1 % SOLN Place 1 drop into both eyes 2 (two) times daily. Can not take generic   Yes [provider]  CALCIUM-VITAMIN D PO Take 600 mg by mouth daily.    Yes [provider]  cholecalciferol (VITAMIN D) 25 MCG (1000 UNIT) tablet Take 3,000 Units by mouth daily.    Yes [provider]  cholestyramine (QUESTRAN) 4 g packet Take 4 g by mouth at bedtime.   Yes [provider]  esomeprazole (NEXIUM) 20 MG capsule Take 20 mg by mouth daily.    Yes [provider]  levothyroxine (SYNTHROID) 75 MCG tablet TAKE 1 TABLET(75 MCG) BY MOUTH DAILY Patient taking differently: Take 75 mcg by mouth daily before breakfast.  03/09/19  Yes Libby Maw, MD  loratadine (CLARITIN) 10 MG tablet Take 10 mg by mouth daily.   Yes [provider]  Polyethyl Glycol-Propyl Glycol (SYSTANE ULTRA OP) Apply 1 drop to eye daily as needed. Both Eyes. For dry eyes.    Yes [provider]  vitamin B-12 (CYANOCOBALAMIN) 1000 MCG tablet Take 1,000 mcg by mouth daily.   Yes [provider]    Physical Exam: BP (!) 189/97   Pulse 99   Temp 98.5 F (36.9 C) (Oral)   Resp (!) 24   Ht 5' (1.524 m)   Wt 60.3 kg   SpO2 97%   BMI 25.97 kg/m   . General: 79 y.o. year-old female well developed well nourished in no acute distress.  Alert and oriented x3. . Cardiovascular: Regular rate and rhythm with no rubs or gallops.  No thyromegaly or JVD noted.  No lower extremity edema. 2/4 pulses in all 4 extremities. Marland Kitchen Respiratory: Clear to auscultation with no wheezes or rales. Good inspiratory  effort. . Abdomen: Soft nontender nondistended with normal bowel sounds x4 quadrants. . Muskuloskeletal: No cyanosis, clubbing or edema noted bilaterally . Neuro: CN II-XII intact, strength, sensation, reflexes . Skin: No ulcerative lesions noted or rashes . Psychiatry: Judgement and insight appear normal. Mood is appropriate for condition and setting          Labs on Admission:  Basic Metabolic Panel: Recent Labs  Lab 12/14/19 1707  NA 133*  K 4.0  CL 95*  CO2 24  GLUCOSE 97  BUN 15  CREATININE 0.72  CALCIUM 9.6   Liver Function Tests: Recent Labs  Lab 12/14/19 1910  AST 16  ALT 13  ALKPHOS 49  BILITOT 0.9  PROT 7.3  ALBUMIN 3.6   Recent Labs  Lab 12/14/19 1910  LIPASE 24   No results for input(s): AMMONIA in the last 168 hours. CBC: Recent Labs  Lab 12/14/19 1707  WBC 7.5  HGB 11.8*  HCT 37.4  MCV 90.1  PLT 543*   Cardiac Enzymes: No results for input(s): CKTOTAL, CKMB, CKMBINDEX, TROPONINI in the last 168 hours.  BNP (last 3 results) No results for input(s): BNP in the last 8760 hours.  ProBNP (last 3 results) No results for input(s): PROBNP in the last 8760 hours.  CBG: Recent Labs  Lab 12/14/19 1651  GLUCAP 83    Radiological Exams on Admission: No results found.  EKG: I independently viewed the EKG done and my findings are as followed: None available at the time of this exam.  Assessment/Plan Present on Admission: **None**  Active Problems:   Generalized weakness  Generalized weakness, suspect secondary to dehydration with poor oral intake Self-reported difficulty swallowing liquids and solids, no odynophagia.  + Poor oral intake Continue IV fluid Obtain speech therapist evaluation for possible MBS EDP consulted GI, Dr. Fuller Plan Aspiration precautions PT OT assessment  Dysphagia, unspecified Management as stated above Previously seen in the GI clinic by GI [place] in 2019 for loose stool, prescribed cholestyramine Follow-up  with GI consult  Thrombocytosis, suspect related to hemoconcentration from dehydration IV fluid hydration Repeat CBC in the morning  Mild normocytic anemia Presented with hemoglobin of 11.8K, MCV 90 No overt bleeding Obtain FOBT and iron studies  Chronic nonproductive cough Unclear etiology, possibly related to GERD Ongoing for more than 2 weeks Will obtain a portable chest x-ray, follow results Antitussives as needed  Hypothyroidism Obtain TSH Resume home levothyroxine  GERD Resume home PPI  Osteoporosis She is on Fosamax, hold for now  History of endometrial cancer in remission Had extensive surgery and radiation in 2011  History of cholecystectomy and loose stools She is on cholestyramine PTA, continue Follows with GI outpatient  Ambulatory dysfunction She ambulates with a walker PT OT to assess Fall precautions    DVT prophylaxis: Subcu Lovenox daily  Code Status: DNR stated by the patient herself  Family Communication: None at bedside  Disposition Plan: Admit to [place] called: EDP consulted GI [place]  Admission status: Observation status   Status is: Observation    Dispo:  Patient From: Home  Planned Disposition: Home with Health Care Svc  Expected discharge date: 12/15/19  Medically stable for discharge: No, ongoing management of generalized weakness, dysphagia, and dehydration.   Kayleen Memos MD [facility] Pager [phone]  If 7PM-7AM, please contact night-coverage [email or web address] Password [facility]  12/14/2019, 11:20 PM

## 2019-12-14 NOTE — Progress Notes (Signed)
Careteam: Patient Care Team: [name], NP as PCP - General (Geriatric Medicine) Alda Berthold, DO as Consulting Physician (Neurology) Gaynelle Arabian, MD as Consulting Physician (Orthopedic Surgery) Felipa Furnace, DPM as Consulting Physician (Podiatry) Renne Crigler, MD as Referring Physician (Obstetrics and Gynecology) Jola Schmidt, MD as Consulting Physician (Ophthalmology)  PLACE OF SERVICE:  [place]  Advanced Directive information    Allergies  Allergen Reactions   Sulfa Antibiotics Rash   Gabapentin Other (See Comments)    Depressed    Lyrica [Pregabalin] Other (See Comments)    unknown   Paxil [Paroxetine]     unknown    Chief Complaint  Patient presents with   Acute Visit    Patient c/o diarrhea (last episode last night "Blow out", loss of appetite, vomiting, fatigue. Since patient is not eating she is suffering with constipation off/on.  Main concern is trouble swallowing. Here with daughter Norva Riffle      HPI: Patient is a 79 y.o. female due to not being able to eat or drink for over a week.  Progressively getting worse since the last visit.  She took a few bites of burger yesterday but threw it up later.  Feels like food, liquid and pills are getting stuck Able to sip water. Holds it in her mouth for a few seconds then swallows. Has had maybe 4 oz of fluids all today. She has to chew things until mush then can swallow. Still comes up.  Has not been able to take her medications because they are not able to pass  Very weak, very fatigue.  Coughs when she sits up. Has had increase in coughing with swallowng.  More short of breath recently.  Has lost 4 lbs in 2 weeks.    Review of Systems:  Review of Systems  Constitutional: Positive for weight loss. Negative for chills and fever.  HENT: Positive for sore throat. Negative for tinnitus.   Respiratory: Positive for cough and shortness of breath. Negative for sputum production.     Cardiovascular: Negative for chest pain, palpitations and leg swelling.  Gastrointestinal: Negative for abdominal pain, constipation, diarrhea and heartburn.  Genitourinary: Negative for dysuria, frequency and urgency.  Musculoskeletal: Negative for back pain, falls, joint pain and myalgias.  Skin: Negative.   Neurological: Positive for weakness. Negative for dizziness and headaches.    Past Medical History:  Diagnosis Date   Arthritis    Cancer [facility])    Endometrial   Complication of anesthesia    very hard to wake up from Anesthesia- then when wakes up feels like she is going to faint or have vomiting   Endometrial cancer ([place])    Per [facility] New Patient Packet    Family history of adverse reaction to anesthesia    daughter has a hard time waking up from Anesthesia   GERD (gastroesophageal reflux disease)    Glaucoma    History of fainting 2021   Per [place] New Patient Packet    Hypothyroidism    Neuromuscular disorder ([place])    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 Gwenlyn Perking    TOTAL KNEE ARTHROPLASTY Right 01/05/2019   Procedure: TOTAL KNEE ARTHROPLASTY;  Surgeon: Gaynelle Arabian, MD;  Location: [facility] ORS;  Service: Orthopedics;  Laterality: Right;  83min   Social History:   reports that she has never smoked. She has never used smokeless tobacco. She reports that she does not drink alcohol and does not use drugs.  Family History  Problem Relation Age of Onset   Arthritis Mother     Osteoporosis Mother    Heart disease Father    Hearing loss Maternal Grandmother    Hearing loss Maternal Grandfather    Cancer Paternal Grandfather    Cancer Sister     Medications: Patient's Medications  New Prescriptions   No medications on file  Previous Medications   ACETAMINOPHEN (TYLENOL) 500 MG TABLET    Take 1,000 mg by mouth daily. At bedtime.    ALENDRONATE (FOSAMAX) 70 MG TABLET    TAKE 1 TABLET BY MOUTH ONCE A WEEK, WITH WATER. DO NOT LIE DOWN FOR AT LEAST 30 MINUTES AND UNTIL AFTER FIRST MEAL OF THE DAY   BIMATOPROST (LUMIGAN) 0.01 % SOLN    Place 1 drop into both eyes at bedtime. Can not take Generic   BRIMONIDINE (ALPHAGAN P) 0.1 % SOLN    Place 1 drop into both eyes 2 (two) times daily. Can not take generic   CALCIUM-VITAMIN D PO    Take 600 mg by mouth.   CHOLECALCIFEROL (VITAMIN D) 25 MCG (1000 UNIT) TABLET    Take 3,000 Units by mouth daily.    CHOLESTYRAMINE (QUESTRAN) 4 G PACKET    Take 4 g by mouth at bedtime.   ESOMEPRAZOLE (NEXIUM) 20 MG CAPSULE    Take 20 mg by mouth daily.    LEVOTHYROXINE (SYNTHROID) 75 MCG TABLET    TAKE 1 TABLET(75 MCG) BY MOUTH DAILY   LORATADINE (CLARITIN) 10 MG TABLET    Take 10 mg by mouth daily.   POLYETHYL GLYCOL-PROPYL GLYCOL (SYSTANE ULTRA OP)    Apply 1 drop to eye as needed. Both Eyes. For dry eyes.   VITAMIN B-12 (CYANOCOBALAMIN) 1000 MCG TABLET    Take 1,000 mcg by mouth daily.  Modified Medications   No medications on file  Discontinued Medications   MIRTAZAPINE (REMERON) 15 MG TABLET    Take 1 tablet (15 mg total) by mouth at bedtime.    Physical Exam:  Vitals:   12/14/19 1544  BP: 132/80  Pulse: 97  Temp: (!) 97.1 F (36.2 C)  TempSrc: Temporal  SpO2: 94%  Weight: 133 lb (60.3 kg)  Height: 5' (1.524 m)   Body mass index is 25.97 kg/m. Wt Readings from Last 3 Encounters:  12/14/19 133 lb (60.3 kg)  11/19/19 137 lb (62.1 kg)  11/02/19 136 lb 3.2 oz (61.8 kg)    Physical Exam Constitutional:       General: She is not in acute distress.    Appearance: She is well-developed. She is not diaphoretic.  HENT:     Head: Normocephalic and atraumatic.     Nose: No congestion.     Mouth/Throat:     Pharynx: Oropharynx is clear. No oropharyngeal exudate.  Eyes:     Conjunctiva/sclera: Conjunctivae normal.     Pupils: Pupils are equal, round, and reactive to  light.  Cardiovascular:     Rate and Rhythm: Normal rate and regular rhythm.     Heart sounds: Normal heart sounds.  Pulmonary:     Effort: Pulmonary effort is normal.     Breath sounds: Decreased air movement present.  Abdominal:     General: Bowel sounds are normal.     Palpations: Abdomen is soft.  Musculoskeletal:        General: No tenderness.     Cervical back: Normal range of motion and neck supple.  Skin:    General: Skin is warm and dry.  Neurological:     Mental Status: She is alert and oriented to person, place, and time.  Psychiatric:        Mood and Affect: Mood normal.        Behavior: Behavior normal.     Labs reviewed: Basic Metabolic Panel: Recent Labs    06/15/19 1409 06/15/19 1409 06/27/19 2046 06/27/19 2057 06/28/19 0746 07/27/19 1422 11/19/19 1414  NA 132*   < > 127*   < > 130* 133* 130*  K 3.6   < > 4.6   < > 3.5 4.2 4.8  CL 100   < > 90*   < > 93* 97* 93*  CO2 25   < > 26   < > 29 26 27   GLUCOSE 142*   < > 116*   < > 98 103 86  BUN 17   < > 19   < > 13 17 18   CREATININE 0.83   < > 0.86   < > 0.70 0.87 0.82  CALCIUM 9.2   < > 9.0   < > 8.8* 10.3 10.0  MG 1.9  --  2.1  --   --   --   --    < > = values in this interval not displayed.   Liver Function Tests: Recent Labs    12/29/18 1442 06/27/19 2046  AST 25 33  ALT 22 10  ALKPHOS 38 42  BILITOT 0.6 0.8  PROT 6.9 6.4*  ALBUMIN 3.8 3.5   No results for input(s): LIPASE, AMYLASE in the last 8760 hours. No results for input(s): AMMONIA in the last 8760 hours. CBC: Recent Labs    06/27/19 2046 06/27/19 2057 06/28/19 0746  07/27/19 1422 11/19/19 1414  WBC 11.2*   < > 7.0 7.1 6.6  NEUTROABS 9.0*  --   --  5,211 4,759  HGB 13.2   < > 11.9* 12.7 12.5  HCT 40.3   < > 36.3 39.7 37.9  MCV 89.4   < > 88.3 90.6 89.2  PLT 429*   < > 398 477* 416*   < > = values in this interval not displayed.   Lipid Panel: No results for input(s): CHOL, HDL, LDLCALC, TRIG, CHOLHDL, LDLDIRECT in the last 8760 hours. TSH: No results for input(s): TSH in the last 8760 hours. A1C: No results found for: HGBA1C   Assessment/Plan 1. Dysphagia, unspecified type -unable to swallow food, water, or pills. If she does she has to chew vigerously and even then it will come up. Unable to consistently keep anything down.   2. Failure to thrive in adult Due to not being able to properly shallow and unable to maintain proper hydration or nutrition as well as swallow pills.  She is now fatigue, weak, short of breath  3. Cough with worsening cough and shortness of breath.    Will send to ED for further evaluation and  treatment at this time due to unable to swallow, cough, weakness and failure to thrive. Pt and daughter in agreement with plan.  Carlos American. [place], [facility] [phone]

## 2019-12-15 ENCOUNTER — Observation Stay (HOSPITAL_COMMUNITY): Payer: Medicare Other

## 2019-12-15 ENCOUNTER — Encounter (HOSPITAL_COMMUNITY): Payer: Self-pay | Admitting: Internal Medicine

## 2019-12-15 DIAGNOSIS — D649 Anemia, unspecified: Secondary | ICD-10-CM | POA: Diagnosis present

## 2019-12-15 DIAGNOSIS — R918 Other nonspecific abnormal finding of lung field: Secondary | ICD-10-CM | POA: Diagnosis not present

## 2019-12-15 DIAGNOSIS — E861 Hypovolemia: Secondary | ICD-10-CM | POA: Diagnosis present

## 2019-12-15 DIAGNOSIS — R5381 Other malaise: Secondary | ICD-10-CM | POA: Diagnosis not present

## 2019-12-15 DIAGNOSIS — K209 Esophagitis, unspecified without bleeding: Secondary | ICD-10-CM | POA: Diagnosis not present

## 2019-12-15 DIAGNOSIS — C7801 Secondary malignant neoplasm of right lung: Secondary | ICD-10-CM | POA: Diagnosis not present

## 2019-12-15 DIAGNOSIS — H409 Unspecified glaucoma: Secondary | ICD-10-CM | POA: Diagnosis present

## 2019-12-15 DIAGNOSIS — R1310 Dysphagia, unspecified: Secondary | ICD-10-CM | POA: Diagnosis not present

## 2019-12-15 DIAGNOSIS — Z9049 Acquired absence of other specified parts of digestive tract: Secondary | ICD-10-CM | POA: Diagnosis not present

## 2019-12-15 DIAGNOSIS — I739 Peripheral vascular disease, unspecified: Secondary | ICD-10-CM | POA: Diagnosis not present

## 2019-12-15 DIAGNOSIS — Z923 Personal history of irradiation: Secondary | ICD-10-CM | POA: Diagnosis not present

## 2019-12-15 DIAGNOSIS — C3431 Malignant neoplasm of lower lobe, right bronchus or lung: Secondary | ICD-10-CM | POA: Diagnosis not present

## 2019-12-15 DIAGNOSIS — Z8542 Personal history of malignant neoplasm of other parts of uterus: Secondary | ICD-10-CM | POA: Diagnosis not present

## 2019-12-15 DIAGNOSIS — D75839 Thrombocytosis, unspecified: Secondary | ICD-10-CM | POA: Diagnosis present

## 2019-12-15 DIAGNOSIS — E869 Volume depletion, unspecified: Secondary | ICD-10-CM | POA: Diagnosis present

## 2019-12-15 DIAGNOSIS — K5730 Diverticulosis of large intestine without perforation or abscess without bleeding: Secondary | ICD-10-CM | POA: Diagnosis not present

## 2019-12-15 DIAGNOSIS — K529 Noninfective gastroenteritis and colitis, unspecified: Secondary | ICD-10-CM | POA: Diagnosis present

## 2019-12-15 DIAGNOSIS — N281 Cyst of kidney, acquired: Secondary | ICD-10-CM | POA: Diagnosis not present

## 2019-12-15 DIAGNOSIS — R531 Weakness: Secondary | ICD-10-CM | POA: Diagnosis not present

## 2019-12-15 DIAGNOSIS — T451X5A Adverse effect of antineoplastic and immunosuppressive drugs, initial encounter: Secondary | ICD-10-CM | POA: Diagnosis not present

## 2019-12-15 DIAGNOSIS — I10 Essential (primary) hypertension: Secondary | ICD-10-CM | POA: Diagnosis present

## 2019-12-15 DIAGNOSIS — Z20822 Contact with and (suspected) exposure to covid-19: Secondary | ICD-10-CM | POA: Diagnosis present

## 2019-12-15 DIAGNOSIS — Z7401 Bed confinement status: Secondary | ICD-10-CM | POA: Diagnosis not present

## 2019-12-15 DIAGNOSIS — E222 Syndrome of inappropriate secretion of antidiuretic hormone: Secondary | ICD-10-CM | POA: Diagnosis present

## 2019-12-15 DIAGNOSIS — K317 Polyp of stomach and duodenum: Secondary | ICD-10-CM | POA: Diagnosis not present

## 2019-12-15 DIAGNOSIS — J9811 Atelectasis: Secondary | ICD-10-CM | POA: Diagnosis not present

## 2019-12-15 DIAGNOSIS — E860 Dehydration: Secondary | ICD-10-CM | POA: Diagnosis present

## 2019-12-15 DIAGNOSIS — K5710 Diverticulosis of small intestine without perforation or abscess without bleeding: Secondary | ICD-10-CM | POA: Diagnosis not present

## 2019-12-15 DIAGNOSIS — D638 Anemia in other chronic diseases classified elsewhere: Secondary | ICD-10-CM | POA: Diagnosis not present

## 2019-12-15 DIAGNOSIS — Z0189 Encounter for other specified special examinations: Secondary | ICD-10-CM | POA: Diagnosis not present

## 2019-12-15 DIAGNOSIS — C541 Malignant neoplasm of endometrium: Secondary | ICD-10-CM | POA: Diagnosis present

## 2019-12-15 DIAGNOSIS — E039 Hypothyroidism, unspecified: Secondary | ICD-10-CM | POA: Diagnosis present

## 2019-12-15 DIAGNOSIS — G620 Drug-induced polyneuropathy: Secondary | ICD-10-CM | POA: Diagnosis not present

## 2019-12-15 DIAGNOSIS — M2550 Pain in unspecified joint: Secondary | ICD-10-CM | POA: Diagnosis not present

## 2019-12-15 DIAGNOSIS — R1312 Dysphagia, oropharyngeal phase: Secondary | ICD-10-CM | POA: Diagnosis present

## 2019-12-15 DIAGNOSIS — M6281 Muscle weakness (generalized): Secondary | ICD-10-CM | POA: Diagnosis present

## 2019-12-15 DIAGNOSIS — J90 Pleural effusion, not elsewhere classified: Secondary | ICD-10-CM | POA: Diagnosis not present

## 2019-12-15 DIAGNOSIS — D630 Anemia in neoplastic disease: Secondary | ICD-10-CM | POA: Diagnosis present

## 2019-12-15 DIAGNOSIS — Q782 Osteopetrosis: Secondary | ICD-10-CM | POA: Diagnosis not present

## 2019-12-15 DIAGNOSIS — Z66 Do not resuscitate: Secondary | ICD-10-CM | POA: Diagnosis present

## 2019-12-15 DIAGNOSIS — Z7983 Long term (current) use of bisphosphonates: Secondary | ICD-10-CM | POA: Diagnosis not present

## 2019-12-15 DIAGNOSIS — Z452 Encounter for adjustment and management of vascular access device: Secondary | ICD-10-CM | POA: Diagnosis not present

## 2019-12-15 DIAGNOSIS — R52 Pain, unspecified: Secondary | ICD-10-CM | POA: Diagnosis not present

## 2019-12-15 DIAGNOSIS — Z882 Allergy status to sulfonamides status: Secondary | ICD-10-CM | POA: Diagnosis not present

## 2019-12-15 DIAGNOSIS — J910 Malignant pleural effusion: Secondary | ICD-10-CM | POA: Diagnosis not present

## 2019-12-15 DIAGNOSIS — Z96651 Presence of right artificial knee joint: Secondary | ICD-10-CM | POA: Diagnosis present

## 2019-12-15 DIAGNOSIS — E871 Hypo-osmolality and hyponatremia: Secondary | ICD-10-CM | POA: Diagnosis not present

## 2019-12-15 DIAGNOSIS — R634 Abnormal weight loss: Secondary | ICD-10-CM | POA: Diagnosis present

## 2019-12-15 DIAGNOSIS — K2289 Other specified disease of esophagus: Secondary | ICD-10-CM | POA: Diagnosis not present

## 2019-12-15 DIAGNOSIS — J9621 Acute and chronic respiratory failure with hypoxia: Secondary | ICD-10-CM | POA: Diagnosis present

## 2019-12-15 DIAGNOSIS — Z90710 Acquired absence of both cervix and uterus: Secondary | ICD-10-CM | POA: Diagnosis not present

## 2019-12-15 DIAGNOSIS — M810 Age-related osteoporosis without current pathological fracture: Secondary | ICD-10-CM | POA: Diagnosis present

## 2019-12-15 DIAGNOSIS — J9601 Acute respiratory failure with hypoxia: Secondary | ICD-10-CM | POA: Diagnosis not present

## 2019-12-15 LAB — CBC
HCT: 33.4 % — ABNORMAL LOW (ref 36.0–46.0)
Hemoglobin: 10.6 g/dL — ABNORMAL LOW (ref 12.0–15.0)
MCH: 28.3 pg (ref 26.0–34.0)
MCHC: 31.7 g/dL (ref 30.0–36.0)
MCV: 89.3 fL (ref 80.0–100.0)
Platelets: 458 10*3/uL — ABNORMAL HIGH (ref 150–400)
RBC: 3.74 MIL/uL — ABNORMAL LOW (ref 3.87–5.11)
RDW: 13 % (ref 11.5–15.5)
WBC: 7.1 10*3/uL (ref 4.0–10.5)
nRBC: 0 % (ref 0.0–0.2)

## 2019-12-15 LAB — BASIC METABOLIC PANEL
Anion gap: 10 (ref 5–15)
BUN: 12 mg/dL (ref 8–23)
CO2: 22 mmol/L (ref 22–32)
Calcium: 8.4 mg/dL — ABNORMAL LOW (ref 8.9–10.3)
Chloride: 98 mmol/L (ref 98–111)
Creatinine, Ser: 0.61 mg/dL (ref 0.44–1.00)
GFR, Estimated: >60 mL/min (ref >60)
Glucose, Bld: 103 mg/dL — ABNORMAL HIGH (ref 70–99)
Potassium: 3.7 mmol/L (ref 3.5–5.1)
Sodium: 130 mmol/L — ABNORMAL LOW (ref 135–145)

## 2019-12-15 MED ORDER — DEXTROSE IN LACTATED RINGERS 5 % IV SOLN: INTRAVENOUS | Status: AC

## 2019-12-15 MED ORDER — HYDRALAZINE HCL 20 MG/ML IJ SOLN
10.0000 mg | Freq: Four times a day (QID) | INTRAMUSCULAR | Status: DC | PRN
  Administered 2019-12-15: 10 mg via INTRAVENOUS
  Filled 2019-12-15: qty 1

## 2019-12-15 MED ORDER — KETOROLAC TROMETHAMINE 15 MG/ML IJ SOLN
15.0000 mg | Freq: Once | INTRAMUSCULAR | Status: AC
  Administered 2019-12-15: 15 mg via INTRAVENOUS
  Filled 2019-12-15: qty 1

## 2019-12-15 MED ORDER — ONDANSETRON HCL 4 MG/2ML IJ SOLN
4.0000 mg | Freq: Four times a day (QID) | INTRAMUSCULAR | Status: DC | PRN
  Filled 2019-12-15: qty 2

## 2019-12-15 MED ORDER — CHOLESTYRAMINE 4 G PO PACK
4.0000 g | PACK | Freq: Every day | ORAL | Status: DC
  Administered 2019-12-15: 4 g via ORAL
  Filled 2019-12-15 (×4): qty 1

## 2019-12-15 MED ORDER — PANTOPRAZOLE SODIUM 40 MG PO TBEC
40.0000 mg | DELAYED_RELEASE_TABLET | Freq: Every day | ORAL | Status: DC
  Filled 2019-12-15: qty 1

## 2019-12-15 MED ORDER — ENOXAPARIN SODIUM 40 MG/0.4ML ~~LOC~~ SOLN
40.0000 mg | SUBCUTANEOUS | Status: DC
  Administered 2019-12-15: 40 mg via SUBCUTANEOUS
  Filled 2019-12-15: qty 0.4

## 2019-12-15 MED ORDER — PANTOPRAZOLE SODIUM 40 MG IV SOLR
40.0000 mg | Freq: Two times a day (BID) | INTRAVENOUS | Status: DC
  Administered 2019-12-15 – 2019-12-17 (×3): 40 mg via INTRAVENOUS
  Filled 2019-12-15 (×6): qty 40

## 2019-12-15 MED ORDER — VITAMIN B-12 1000 MCG PO TABS
1000.0000 ug | ORAL_TABLET | Freq: Every day | ORAL | Status: DC
  Administered 2019-12-16 – 2019-12-24 (×9): 1000 ug via ORAL
  Filled 2019-12-15 (×10): qty 1

## 2019-12-15 MED ORDER — HYDRALAZINE HCL 10 MG PO TABS: 10.0000 mg | ORAL_TABLET | Freq: Four times a day (QID) | ORAL | Status: DC | PRN

## 2019-12-15 MED ORDER — LEVOTHYROXINE SODIUM 50 MCG PO TABS
75.0000 ug | ORAL_TABLET | Freq: Every day | ORAL | Status: DC
  Administered 2019-12-15 – 2019-12-24 (×10): 75 ug via ORAL
  Filled 2019-12-15 (×11): qty 1

## 2019-12-15 MED ORDER — LORATADINE 10 MG PO TABS
10.0000 mg | ORAL_TABLET | Freq: Every day | ORAL | Status: DC
  Administered 2019-12-16 – 2019-12-24 (×9): 10 mg via ORAL
  Filled 2019-12-15 (×10): qty 1

## 2019-12-15 NOTE — ED Notes (Signed)
OT / PT at bedside.

## 2019-12-15 NOTE — Evaluation (Signed)
Physical Therapy Evaluation Patient Details Name: Katie [name] MRN: [id number] DOB: 1940/06/07 Today's Date: 12/15/2019   History of Present Illness  Katie [name] is a 79 y.o. female with medical history significant for endometrial cancer  in remission, hypothyroidism, GERD, osteoporosis who presented to [facility] ED du e to gradually worsening generalized weakness, poor oral intake and dysphagia  of 2 weeks duration,  unintentional weight loss.  Clinical Impression  The patient  Resting  In bed on RA, SPO2 935, HR 92, BP 164/86.Marland Kitchen Patient mobilized to sitting , then took a few steps from the bed and back, Noted SPO2 dropping to 84%. RR 34, HR 113.Dyspnea 4/4. Patient requires frequent rest  breaks.  Patient lives alone in an apartment. Pt admitted with above diagnosis.  Pt currently with functional limitations due to the deficits listed below (see PT Problem List). Pt will benefit from skilled PT to increase their independence and safety with mobility to allow discharge to the venue listed below.       Follow Up Recommendations Home health PT;Supervision/Assistance - 24 hour vs SNF depending on progress and resolution of acute medical issues.     Equipment Recommendations  None recommended by PT    Recommendations for Other Services       Precautions / Restrictions Precautions Precautions: Fall Precaution Comments: reports several falls, getting very SOB with activity      Mobility  Bed Mobility Overal bed mobility: Needs Assistance Bed Mobility: Supine to Sit;Sit to Supine     Supine to sit: Supervision Sit to supine: Supervision   General bed mobility comments: pt did not want nor require assistance  for  sitting up/lying down with HOB raised    Transfers Overall transfer level: Needs assistance Equipment used: Rolling walker (2 wheeled) Transfers: Sit to/from Stand Sit to Stand: Mod assist;+2 safety/equipment;From elevated surface         General transfer  comment: stood from stretcher and sat back up onto stretcher, noted very SOB  Ambulation/Gait Ambulation/Gait assistance: +2 safety/equipment;Mod assist Gait Distance (Feet): 6 Feet Assistive device: Rolling walker (2 wheeled) Gait Pattern/deviations: Step-to pattern;Step-through pattern Gait velocity: decr   General Gait Details: steady assistanc e due to increased dyspnea. patient  ambualed ` 6 "  SpPo2 84 % on RA.  Stairs            Wheelchair Mobility    Modified Rankin (Stroke Patients Only)       Balance Overall balance assessment: Needs assistance Sitting-balance support: Feet supported;Bilateral upper extremity supported Sitting balance-Leahy Scale: Fair     Standing balance support: During functional activity;Bilateral upper extremity supported Standing balance-Leahy Scale: Poor Standing balance comment: steady assistnace and use of RW                             Pertinent Vitals/Pain Pain Assessment: Faces Faces Pain Scale: Hurts little more Pain Location: back and breathing Pain Descriptors / Indicators: Aching;Grimacing;Guarding Pain Intervention(s): Monitored during session;Limited activity within patient's tolerance;Repositioned    Home Living Family/patient expects to be discharged to:: Private residence Living Arrangements: Alone Available Help at Discharge: Neighbor;Available PRN/intermittently Type of Home: Apartment Home Access:  (1 step to get to car)     Home Layout: One level Home Equipment: Cane - single point;Bedside commode;Toilet riser;Tub bench;Walker - 4 wheels Additional Comments: has Link to [employer]    Prior Function Level of Independence: Independent with assistive device(s)  Comments: using SPC for mobility  in home, uses rollator outside when walks with neighbo,  iADL tasks with mod independence     Hand Dominance   Dominant Hand: Right    Extremity/Trunk Assessment   Upper Extremity  Assessment Upper Extremity Assessment: Defer to OT evaluation    Lower Extremity Assessment Lower Extremity Assessment: Generalized weakness    Cervical / Trunk Assessment Cervical / Trunk Assessment: Normal  Communication   Communication: No difficulties;HOH  Cognition Arousal/Alertness: Awake/alert Behavior During Therapy: WFL for tasks assessed/performed;Anxious Overall Cognitive Status: Within Functional Limits for tasks assessed                                        General Comments      Exercises     Assessment/Plan    PT Assessment Patient needs continued PT services  PT Problem List Decreased strength;Decreased knowledge of use of DME;Decreased activity tolerance;Decreased safety awareness;Decreased balance;Decreased knowledge of precautions;Decreased mobility;Cardiopulmonary status limiting activity       PT Treatment Interventions DME instruction;Gait training;Functional mobility training;Therapeutic activities;Therapeutic exercise;Patient/family education    PT Goals (Current goals can be found in the Care Plan section)  Acute Rehab PT Goals Patient Stated Goal: to go home PT Goal Formulation: With patient Time For Goal Achievement: 12/29/19 Potential to Achieve Goals: Fair    Frequency Min 3X/week   Barriers to discharge        Co-evaluation PT/OT/SLP Co-Evaluation/Treatment: Yes Reason for Co-Treatment: For patient/therapist safety;To address functional/ADL transfers PT goals addressed during session: Mobility/safety with mobility         AM-PAC PT "6 Clicks" Mobility  Outcome Measure Help needed turning from your back to your side while in a flat bed without using bedrails?: A Little Help needed moving from lying on your back to sitting on the side of a flat bed without using bedrails?: A Little Help needed moving to and from a bed to a chair (including a wheelchair)?: A Lot Help needed standing up from a chair using your arms  (e.g., wheelchair or bedside chair)?: A Lot Help needed to walk in hospital room?: A Lot Help needed climbing 3-5 steps with a railing? : Total 6 Click Score: 13    End of Session   Activity Tolerance: Treatment limited secondary to medical complications (Comment);Patient limited by fatigue Patient left: in bed;with call bell/phone within reach Nurse Communication: Mobility status PT Visit Diagnosis: History of falling (Z91.81);Difficulty in walking, not elsewhere classified (R26.2)    Time: 1610-9604 PT Time Calculation (min) (ACUTE ONLY): 22 min   Charges:   PT Evaluation $PT Eval Low Complexity: [place] PT Acute Rehabilitation Services Pager [phone] Office [phone]   Claretha Cooper 12/15/2019, 11:14 AM

## 2019-12-15 NOTE — Anesthesia Preprocedure Evaluation (Addendum)
Anesthesia Evaluation  Patient identified by MRN, date of birth, ID band Patient awake    Reviewed: Allergy & Precautions, NPO status , Patient's Chart, lab work & pertinent test results  History of Anesthesia Complications (+) PONV  Airway Mallampati: II  TM Distance: >3 FB Neck ROM: Full    Dental no notable dental hx. (+) Upper Dentures,    Pulmonary    Pulmonary exam normal breath sounds clear to auscultation       Cardiovascular hypertension, + Peripheral Vascular Disease  PND:    Normal cardiovascular exam Rhythm:Regular Rate:Normal     Neuro/Psych  Neuromuscular disease    GI/Hepatic Neg liver ROS, GERD  Medicated and Controlled,  Endo/Other  Hypothyroidism   Renal/GU negative Renal ROS   Endometrial CA,     Musculoskeletal  (+) Arthritis ,   Abdominal   Peds  Hematology  (+) anemia ,   Anesthesia Other Findings   Reproductive/Obstetrics                            Anesthesia Physical Anesthesia Plan  ASA: III  Anesthesia Plan: MAC   Post-op Pain Management:    Induction:   PONV Risk Score and Plan: Treatment may vary due to age or medical condition  Airway Management Planned: Nasal Cannula and Natural Airway  Additional Equipment: None  Intra-op Plan:   Post-operative Plan:   Informed Consent: I have reviewed the patients History and Physical, chart, labs and discussed the procedure including the risks, benefits and alternatives for the proposed anesthesia with the patient or authorized representative who has indicated his/her understanding and acceptance.     Dental advisory given  Plan Discussed with: CRNA and Anesthesiologist  Anesthesia Plan Comments: (EGD for Dysphagia)       Anesthesia Quick Evaluation

## 2019-12-15 NOTE — ED Notes (Signed)
Hourly rounding complete. Pt resting comfortably with bed in lowest position and side rails up x2. Meal tray provided (liquid diet). Vitals updated. All needs met at this time. Will continue to monitor.

## 2019-12-15 NOTE — ED Notes (Signed)
Report given to 6E RN 

## 2019-12-15 NOTE — ED Notes (Signed)
Hourly rounding complete. Pt resting comfortably with bed in lowest position and side rails up x2. Vitals updated. All needs met at this time. Will continue to monitor. 

## 2019-12-15 NOTE — Consult Note (Signed)
Consultation  Referring Provider:  Dr. Louanne Belton    Primary Care Physician:  [name], NP Primary Gastroenterologist: Dr. Silverio Decamp      Reason for Consultation: Dysphagia            HPI:   Katie [name] is a 79 y.o. female with a past medical history significant for endometrial cancer 10 years ago with extensive surgery and radiation in 2011, cancer in remission, GERD, osteoporosis, who presented to the ER on 12/14/2019 with a complaint of gradually worsening generalized weakness, poor intake and dysphagia with solids and liquids x2 weeks.    Today, the patient explains that over the past 2 weeks she has had trouble swallowing anything.  Explains that if she tries to swallow something she will gag and then it will come right back up.  This has occurred with most anything that she has tried to put in her mouth, she has been able to keep down some applesauce occasionally, but mostly it has only been taking 2-3 bites of things.  Describes a 4 pound weight loss over the past couple weeks related to this.  Also describes some intermittent diarrhea but also tells me she did not take her Cholestyramine because she was somewhat constipated at one point.  Associated symptoms include a nonproductive cough over the past 2 months.  Patient is on Nexium 20 mg daily since 2001 for reflux symptoms which she tells me are well controlled.    Denies fever, chills, abdominal pain, esophageal pain or symptoms that awaken her from sleep.     ED course: Mild anemia with a hemoglobin of 11.8 and thrombocytosis 543,000, persistent nonproductive cough  GI history: 05/30/2017 patient seen in clinic by Alonza Bogus: Seen for evaluation of chronic loose stools, at that time describes symptoms since 2011, at that time was going to receive records from previous colonoscopy 2010 2011 and start Questran  Past Medical History:  Diagnosis Date  . Arthritis   . Cancer [facility])    Endometrial  . Complication of  anesthesia    very hard to wake up from Anesthesia- then when wakes up feels like she is going to faint or have vomiting  . Endometrial cancer [facility])    Per [facility] New Patient Packet   . Family history of adverse reaction to anesthesia    daughter has a hard time waking up from Anesthesia  . GERD (gastroesophageal reflux disease)   . Glaucoma   . History of fainting 2021   Per [facility] Patient Packet   . Hypothyroidism   . 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 PONV (postoperative nausea and vomiting)   . Thyroid disease     Past Surgical History:  Procedure Laterality Date  . ABDOMINAL HYSTERECTOMY    . APPENDECTOMY  1958  . BREAST EXCISIONAL BIOPSY Right    benign, was a cyst  . BREAST SURGERY Left    lumpectomy in 1989  . CATARACT EXTRACTION  04/29/2019  . CATARACT EXTRACTION Right 09/2019  . CHOLECYSTECTOMY    . FEMUR SURGERY  06/2013   Dr.Suarez   . ROTATOR CUFF REPAIR  2003   Per [facility] Patient Packet, Dr. Gwenlyn Perking   . 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 Arthritis Mother   . Osteoporosis Mother   . Heart disease Father   . Hearing loss Maternal Grandmother   . Hearing loss Maternal Grandfather   . Cancer Paternal Grandfather   . Cancer Sister      Social History   Tobacco Use  . Smoking status: Never Smoker  . Smokeless tobacco: Never Used  Vaping Use  . Vaping Use: Never used  Substance Use Topics  . Alcohol use: No  . Drug use: No    Prior to Admission medications   Medication Sig Start Date End Date Taking? Authorizing Provider  acetaminophen (TYLENOL) 500 MG tablet Take 1,000 mg by mouth daily. At bedtime.    Yes [provider]  alendronate (FOSAMAX) 70 MG tablet TAKE 1 TABLET BY MOUTH  ONCE A WEEK, WITH WATER. DO NOT LIE DOWN FOR AT LEAST 30 MINUTES AND UNTIL AFTER FIRST MEAL OF THE DAY Patient taking differently: Take 70 mg by mouth once a week. Take on Sundays 11/25/19  Yes [name], NP  bimatoprost (LUMIGAN) 0.01 % SOLN Place 1 drop into both eyes at bedtime. Can not take Generic   Yes [provider]  brimonidine (ALPHAGAN P) 0.1 % SOLN Place 1 drop into both eyes 2 (two) times daily. Can not take generic   Yes [provider]  CALCIUM-VITAMIN D PO Take 600 mg by mouth daily.    Yes [provider]  cholecalciferol (VITAMIN D) 25 MCG (1000 UNIT) tablet Take 3,000 Units by mouth daily.    Yes [provider]  cholestyramine (QUESTRAN) 4 g packet Take 4 g by mouth at bedtime.   Yes [provider]  esomeprazole (NEXIUM) 20 MG capsule Take 20 mg by mouth daily.    Yes [provider]  levothyroxine (SYNTHROID) 75 MCG tablet TAKE 1 TABLET(75 MCG) BY MOUTH DAILY Patient taking differently: Take 75 mcg by mouth daily before breakfast.  03/09/19  Yes Libby Maw, MD  loratadine (CLARITIN) 10 MG tablet Take 10 mg by mouth daily.   Yes [provider]  Polyethyl Glycol-Propyl Glycol (SYSTANE ULTRA OP) Apply 1 drop to eye daily as needed. Both Eyes. For dry eyes.    Yes [provider]  vitamin B-12 (CYANOCOBALAMIN) 1000 MCG tablet Take 1,000 mcg by mouth daily.   Yes [provider]    Current Facility-Administered Medications  Medication Dose Route Frequency Provider Last Rate Last Admin  . 0.9 %  sodium chloride infusion   Intravenous Continuous Kayleen Memos, DO   Stopped at 12/15/19 0039  . cholestyramine (QUESTRAN) packet 4 g  4 g Oral QHS Hall, Carole N, DO      . dextrose 5 % in lactated ringers infusion   Intravenous Continuous Kayleen Memos, DO 75 mL/hr at 12/15/19 0104 New Bag at 12/15/19 0104  . enoxaparin (LOVENOX) injection 40 mg  40 mg Subcutaneous Q24H Irene Pap N,  DO   40 mg at 12/15/19 0936  . levothyroxine (SYNTHROID) tablet 75 mcg  75 mcg Oral QAC breakfast Kayleen Memos, DO   75 mcg at 12/15/19 0557  . loratadine (CLARITIN) tablet 10 mg  10 mg Oral Daily Hall, Carole N, DO      . pantoprazole (PROTONIX) EC tablet 40 mg  40 mg Oral Daily Hall, Carole N, DO      . vitamin B-12 (CYANOCOBALAMIN) tablet 1,000 mcg  1,000 mcg Oral Daily Kayleen Memos, DO  Current Outpatient Medications  Medication Sig Dispense Refill  . acetaminophen (TYLENOL) 500 MG tablet Take 1,000 mg by mouth daily. At bedtime.     Marland Kitchen alendronate (FOSAMAX) 70 MG tablet TAKE 1 TABLET BY MOUTH ONCE A WEEK, WITH WATER. DO NOT LIE DOWN FOR AT LEAST 30 MINUTES AND UNTIL AFTER FIRST MEAL OF THE DAY (Patient taking differently: Take 70 mg by mouth once a week. Take on Sundays) 12 tablet 1  . bimatoprost (LUMIGAN) 0.01 % SOLN Place 1 drop into both eyes at bedtime. Can not take Generic    . brimonidine (ALPHAGAN P) 0.1 % SOLN Place 1 drop into both eyes 2 (two) times daily. Can not take generic    . CALCIUM-VITAMIN D PO Take 600 mg by mouth daily.     . cholecalciferol (VITAMIN D) 25 MCG (1000 UNIT) tablet Take 3,000 Units by mouth daily.     . cholestyramine (QUESTRAN) 4 g packet Take 4 g by mouth at bedtime.    Marland Kitchen esomeprazole (NEXIUM) 20 MG capsule Take 20 mg by mouth daily.     Marland Kitchen levothyroxine (SYNTHROID) 75 MCG tablet TAKE 1 TABLET(75 MCG) BY MOUTH DAILY (Patient taking differently: Take 75 mcg by mouth daily before breakfast. ) 90 tablet 3  . loratadine (CLARITIN) 10 MG tablet Take 10 mg by mouth daily.    Vladimir Faster Glycol-Propyl Glycol (SYSTANE ULTRA OP) Apply 1 drop to eye daily as needed. Both Eyes. For dry eyes.     . vitamin B-12 (CYANOCOBALAMIN) 1000 MCG tablet Take 1,000 mcg by mouth daily.      Allergies as of 12/14/2019 - Review Complete 12/14/2019  Allergen Reaction Noted  . Sulfa antibiotics Rash 01/23/2017  . Gabapentin Other (See Comments) 01/23/2017  . Lyrica  [pregabalin] Other (See Comments) 01/23/2017  . Paxil [paroxetine]  06/27/2019     Review of Systems:    Constitutional: No weight loss, fever or chills  Skin: No rash  Cardiovascular: No chest pain Respiratory: No SOB  Gastrointestinal: See HPI and otherwise negative Genitourinary: No dysuria Neurological: No headache, dizziness or syncope Musculoskeletal: No new muscle or joint pain Hematologic: No bleeding  Psychiatric: No history of depression or anxiety    Physical Exam:  Vital signs in last 24 hours: Temp:  [97.1 F (36.2 C)-98.5 F (36.9 C)] 98.5 F (36.9 C) (11/08 1644) Pulse Rate:  [83-110] 92 (11/09 1030) Resp:  [16-38] 23 (11/09 1030) BP: (130-200)/(77-120) 170/100 (11/09 1030) SpO2:  [90 %-97 %] 93 % (11/09 1030) Weight:  [60.3 kg] 60.3 kg (11/08 1644)   General:   Pleasant Elderly Caucasian female appears to be in NAD, Well developed, Well nourished, alert and cooperative Head:  Normocephalic and atraumatic. Eyes:   PEERL, EOMI. No icterus. Conjunctiva pink. Ears:  Normal auditory acuity. Neck:  Supple Throat: Oral cavity and pharynx without inflammation, swelling or lesion. Teeth in good condition. Lungs: Respirations even and unlabored. Lungs clear to auscultation bilaterally.   No wheezes, crackles, or rhonchi.  Heart: Normal S1, S2. No MRG. Regular rate and rhythm. No peripheral edema, cyanosis or pallor.  Abdomen:  Soft, nondistended, nontender. No rebound or guarding. Normal bowel sounds. No appreciable masses or hepatomegaly. Rectal:  Not performed.  Msk:  Symmetrical without gross deformities. Peripheral pulses intact.  Extremities:  Without edema, no deformity or joint abnormality.  Neurologic:  Alert and  oriented x4;  grossly normal neurologically.  Skin:   Dry and intact without significant lesions or rashes. Psychiatric: Demonstrates good judgement  and reason without abnormal affect or behaviors.   LAB RESULTS: Recent Labs    12/14/19 1707  12/15/19 0417  WBC 7.5 7.1  HGB 11.8* 10.6*  HCT 37.4 33.4*  PLT 543* 458*   BMET Recent Labs    12/14/19 1707 12/15/19 0417  NA 133* 130*  K 4.0 3.7  CL 95* 98  CO2 24 22  GLUCOSE 97 103*  BUN 15 12  CREATININE 0.72 0.61  CALCIUM 9.6 8.4*   LFT Recent Labs    12/14/19 1910  PROT 7.3  ALBUMIN 3.6  AST 16  ALT 13  ALKPHOS 49  BILITOT 0.9  BILIDIR 0.1  IBILI 0.8   STUDIES: DG CHEST PORT 1 VIEW  Result Date: 12/15/2019 CLINICAL DATA:  Inability to eat, possible stricture, lack of intake, history of endometrial cancer EXAM: PORTABLE CHEST 1 VIEW COMPARISON:  Radiograph 06/27/2019 FINDINGS: New moderate to large right pleural effusion with fluid tracking across the lung apex as well. Adjacent opacity likely reflecting some passive atelectatic changes though underlying airspace disease is difficult to fully exclude. Left lung is predominantly clear. Right cardiac margin is largely obscured by opacity. Visible cardiomediastinal contours are unremarkable with a calcified aorta. The osseous structures appear diffusely demineralized which may limit detection of small or nondisplaced fractures. Degenerative changes are present in the imaged spine and shoulders. High-riding appearance of the humeral heads bilaterally compatible with sequela of rotator cuff insufficiency with evidence of prior right shoulder rotator cuff repair. IMPRESSION: 1. New moderate to large right pleural effusion with fluid tracking across the lung apex. Adjacent opacity likely reflecting some passive atelectatic changes though other underlying processes are difficult to exclude. 2.  Aortic Atherosclerosis (ICD10-I70.0). Electronically Signed   By: Lovena Le M.D.   On: 12/15/2019 00:34    Impression / Plan:   Impression: 1.  Dysphagia: Progressive over the past 2 weeks, history of reflux well-controlled on Nexium, no recent NSAID use; consider stricture versus other 2.  Nonproductive cough: Uncertain  etiology, consider postnasal drip 3.  Generalized weakness: Likely related to decreased intake over the past couple of weeks 4.  Mild normocytic anemia 5.  History of endometrial cancer in remission 6.  History of cholecystectomy and loose stools: Typically does well on cholestyramine  Plan: 1.  Patient will benefit from an EGD.  We will schedule this tomorrow with Dr. Tarri Glenn.  Did discuss risks, benefits, limitations and alternatives the patient agrees to proceed. 2.  Agree with IV PPI twice daily until then 3.  Patient requests a soft diet, will allow this today, as much as she can tolerate and then n.p.o. after midnight 4.  Please await any further recommendations from Dr. Tarri Glenn later today.  Thank you for your kind consultation, we will continue to follow.  Lavone Nian Presli Fanguy  12/15/2019, 10:55 AM

## 2019-12-15 NOTE — Progress Notes (Signed)
PROGRESS NOTE  [name] [facility]:[id number] DOB: Aug 13, 1940 DOA: 12/14/2019 PCP: [name], NP   LOS: 0 days   Brief narrative: As per HPI,  Katie [name] is a 79 y.o. female with medical history significant for endometrial cancer 10 years ago with extensive surgery and radiation in 2011 in remission, hypothyroidism, GERD, osteoporosis who presented to the hospital as per her primary care physician ,with worsening generalized weakness, dysphagia to solids and liquids for 2 weeks duration.  Patient did have unintentional weight loss, 4 pounds in the last 2 weeks, intermittent diarrhea, nonproductive cough.    She also complained of difficulty with bolus of food passing down her esophagus, it takes much longer and at times was sticky.    Patient had gone to her  primary care provider who advised her to go to the ED for further evaluation.  Work-up in the ED revealed hypovolemia secondary to dehydration from poor oral intake.  EDP asked to admit for IV fluid hydration.  ED provider also consulted GI for further evaluation.    Assessment/Plan:  Principal Problem:   Generalized weakness Active Problems:   Acquired hypothyroidism   History of endometrial cancer   Chronic diarrhea   Essential hypertension   Dysphagia   Volume depletion  Generalized weakness, likely secondary to volume depletion and poor oral intake.  Progressive dysphagia for the last 2 weeks.  GI has been notified for possible endoscopic evaluation.  Continue aspiration precautions.  Will get speech evaluation.  Will get PT, OT evaluation as well.  Continue IV fluid for hydration.  Dysphagia, unspecified.  GI has been consulted for evaluation.  Will follow recommendation.  Clears for today.  Continue IV fluids.  Patient does have history of chronic diarrhea and was on cholestyramine as outpatient.  GI has plans for endoscopic evaluation in a.m.  Thrombocytosis, likely reactive.  We will continue to  monitor.  Mild normocytic anemia  hemoglobin of 11.8K, MCV 90 on presentation.  Hemoglobin of 10.6 today.  Iron TIBC occult blood and ferritin pending.  Chronic cough likely secondary to moderate to large right pleural effusion. Hypoxic on exertion.  Could be secondary to GERD/dysphagia.  Chest x-ray done today showed new moderate to large right pleural effusion with fluid tracking over the lung apex.  Possibility of atelectasis.  Patient is not on any supplemental oxygen at this time but feels dyspneic.  We will get ultrasound-guided thoracocentesis with pleural fluid labs.  Patient was negative for Covid and influenza.  There is a possibility of dysphagia leading to aspiration and possible pneumonia but patient does not have any fever without leukocytosis or productive sputum so I not inclined to put the patient on antibiotic at this time until we have some positive report on the fluid analysis.  Will need a speech and swallow evaluation/EGD.  Hypothyroidism Continue Synthroid.  TSH pending  Hyponatremia. Sodium of 130 on presentation. Will need to monitor closely.   GERD Continue PPI  Osteoporosis Hold Fosamax for now  History of endometrial cancer in remission Had extensive surgery and radiation in 2011.  History of cholecystectomy and  chronic diarrhea. She is on cholestyramine chronically. Follows with GI outpatient.  Has not had a bowel movement since yesterday.  Ambulatory dysfunction At baseline, patient walks with a walker.  Will get PT OT evaluation while in hospital.  DVT prophylaxis: enoxaparin (LOVENOX) injection 40 mg Start: 12/15/19 1000   Code Status: DO NOT RESUSCITATE  Family Communication: I spoke with the patient's daughter Ms  [name] on the phone and updated her about the clinical condition of the patient.  Status is: Observation  The patient will require care spanning > 2 midnights and should be moved to inpatient because: IV treatments appropriate due  to intensity of illness or inability to take PO, Inpatient level of care appropriate due to severity of illness and dysphagia, new effusion, need for IR thoracocentesis, endoscopic evaluation  Dispo:  Patient From: Home  Planned Disposition: Home with Health Care Svc  Expected discharge date: 2-3 days  Medically stable for discharge: No   Consultants:  GI  Procedures:  None  Antibiotics:  . None  Anti-infectives (From admission, onward)   None     Subjective: Today, patient was seen and examined at bedside. Nursing staff stated that he was hypoxic on exertion, patient also complains of headache and nausea with ongoing dysphagia.  Patient is daughter on the phone reported that that she had been having some rattling sound and dysphagia.  Objective: Vitals:   12/15/19 0700 12/15/19 0813  BP: (!) 200/109 (!) 172/88  Pulse: 92 87  Resp: (!) 28 (!) 24  Temp:    SpO2: 94% 93%   No intake or output data in the 24 hours ending 12/15/19 0840 Filed Weights   12/14/19 1644  Weight: 60.3 kg   Body mass index is 25.97 kg/m.   Physical Exam: GENERAL: Patient is alert awake and communicative, not in obvious distress. On room air.  Mildly hard of hearing HENT: No scleral pallor or icterus. Pupils equally reactive to light. Oral mucosa is moist NECK: is supple, no gross swelling noted. CHEST:   Diminished breath sounds on the right, coarse breath sounds noted.  No overt wheezing. CVS: S1 and S2 heard, no murmur. Regular rate and rhythm.  ABDOMEN: Soft, non-tender, bowel sounds are present. EXTREMITIES: No edema. CNS: Cranial nerves are intact. No focal motor deficits. SKIN: warm and dry without rashes.  Data Review: I have personally reviewed the following laboratory data and studies,  CBC: Recent Labs  Lab 12/14/19 1707 12/15/19 0417  WBC 7.5 7.1  HGB 11.8* 10.6*  HCT 37.4 33.4*  MCV 90.1 89.3  PLT 543* 284*   Basic Metabolic Panel: Recent Labs  Lab 12/14/19 1707  12/15/19 0417  NA 133* 130*  K 4.0 3.7  CL 95* 98  CO2 24 22  GLUCOSE 97 103*  BUN 15 12  CREATININE 0.72 0.61  CALCIUM 9.6 8.4*   Liver Function Tests: Recent Labs  Lab 12/14/19 1910  AST 16  ALT 13  ALKPHOS 49  BILITOT 0.9  PROT 7.3  ALBUMIN 3.6   Recent Labs  Lab 12/14/19 1910  LIPASE 24   No results for input(s): AMMONIA in the last 168 hours. Cardiac Enzymes: No results for input(s): CKTOTAL, CKMB, CKMBINDEX, TROPONINI in the last 168 hours. BNP (last 3 results) No results for input(s): BNP in the last 8760 hours.  ProBNP (last 3 results) No results for input(s): PROBNP in the last 8760 hours.  CBG: Recent Labs  Lab 12/14/19 1651  GLUCAP 83   Recent Results (from the past 240 hour(s))  Respiratory Panel by RT PCR (Flu A&B, Covid) - Nasopharyngeal Swab     Status: None   Collection Time: 12/14/19 10:39 PM   Specimen: Nasopharyngeal Swab  Result Value Ref Range Status   SARS Coronavirus 2 by RT PCR NEGATIVE NEGATIVE Final    Comment: (NOTE) SARS-CoV-2 target nucleic acids are NOT DETECTED.  The SARS-CoV-2 RNA  is generally detectable in upper respiratoy specimens during the acute phase of infection. The lowest concentration of SARS-CoV-2 viral copies this assay can detect is 131 copies/mL. A negative result does not preclude SARS-Cov-2 infection and should not be used as the sole basis for treatment or other patient management decisions. A negative result may occur with  improper specimen collection/handling, submission of specimen other than nasopharyngeal swab, presence of viral mutation(s) within the areas targeted by this assay, and inadequate number of viral copies (<131 copies/mL). A negative result must be combined with clinical observations, patient history, and epidemiological information. The expected result is Negative.  Fact Sheet for Patients:  PinkCheek.be  Fact Sheet for Healthcare Providers:    GravelBags.it  This test is no t yet approved or cleared by the [place] FDA and  has been authorized for detection and/or diagnosis of SARS-CoV-2 by FDA under an Emergency Use Authorization (EUA). This EUA will remain  in effect (meaning this test can be used) for the duration of the COVID-19 declaration under Section 564(b)(1) of the Act, 21 U.S.C. section 360bbb-3(b)(1), unless the authorization is terminated or revoked sooner.     Influenza A by PCR NEGATIVE NEGATIVE Final   Influenza B by PCR NEGATIVE NEGATIVE Final    Comment: (NOTE) The Xpert Xpress SARS-CoV-2/FLU/RSV assay is intended as an aid in  the diagnosis of influenza from Nasopharyngeal swab specimens and  should not be used as a sole basis for treatment. Nasal washings and  aspirates are unacceptable for Xpert Xpress SARS-CoV-2/FLU/RSV  testing.  Fact Sheet for Patients: PinkCheek.be  Fact Sheet for Healthcare Providers: GravelBags.it  This test is not yet approved or cleared by the [place] FDA and  has been authorized for detection and/or diagnosis of SARS-CoV-2 by  FDA under an Emergency Use Authorization (EUA). This EUA will remain  in effect (meaning this test can be used) for the duration of the  Covid-19 declaration under Section 564(b)(1) of the Act, 21  U.S.C. section 360bbb-3(b)(1), unless the authorization is  terminated or revoked. Performed at [facility], [place] [address], [place], [place] [address]      Studies: DG CHEST PORT 1 VIEW  Result Date: 12/15/2019 CLINICAL DATA:  Inability to eat, possible stricture, lack of intake, history of endometrial cancer EXAM: PORTABLE CHEST 1 VIEW COMPARISON:  Radiograph 06/27/2019 FINDINGS: New moderate to large right pleural effusion with fluid tracking across the lung apex as well. Adjacent opacity likely reflecting some passive atelectatic  changes though underlying airspace disease is difficult to fully exclude. Left lung is predominantly clear. Right cardiac margin is largely obscured by opacity. Visible cardiomediastinal contours are unremarkable with a calcified aorta. The osseous structures appear diffusely demineralized which may limit detection of small or nondisplaced fractures. Degenerative changes are present in the imaged spine and shoulders. High-riding appearance of the humeral heads bilaterally compatible with sequela of rotator cuff insufficiency with evidence of prior right shoulder rotator cuff repair. IMPRESSION: 1. New moderate to large right pleural effusion with fluid tracking across the lung apex. Adjacent opacity likely reflecting some passive atelectatic changes though other underlying processes are difficult to exclude. 2.  Aortic Atherosclerosis (ICD10-I70.0). Electronically Signed   By: Lovena Le M.D.   On: 12/15/2019 00:34      Flora Lipps, MD  [facility] 12/15/2019

## 2019-12-15 NOTE — Progress Notes (Signed)
Occupational Therapy Evaluation  Patient with functional deficits listed below impacting safety and independence with self care. At baseline patient is mod I with ADLs, most IADLs does have a cleaning lady that comes every other week. Recently patient feeling too fatigued to grocery shop, has friends/family that have been assisting with that. Currently patient requiring mod A x1-2 for sit to stand and steadying assist at EOB with rolling walker. Patient with decreased activity tolerance and desaturation to 84% on room air. Educate patient in pursed lip breathing techniques, recovers to low 90s upon return to gurney with increased time. Recommend continued acute OT services in order to facilitate to D/C venue listed below.    12/15/19 1400  OT Visit Information  Last OT Received On 12/15/19  Assistance Needed +2 (to ambulate)  PT/OT/SLP Co-Evaluation/Treatment Yes  Reason for Co-Treatment For patient/therapist safety;To address functional/ADL transfers  OT goals addressed during session ADL's and self-care  History of Present Illness Katie [name] is a 79 y.o. female with medical history significant for endometrial cancer  in remission, hypothyroidism, GERD, osteoporosis who presented to [facility] ED du e to gradually worsening generalized weakness, poor oral intake and dysphagia  of 2 weeks duration,  unintentional weight loss.  Precautions  Precautions Fall  Precaution Comments reports several falls, getting very SOB with activity  Restrictions  Weight Bearing Restrictions No  Home Living  Family/patient expects to be discharged to: Private residence  Living Arrangements Alone  Available Help at Discharge Neighbor;Available PRN/intermittently  Type of Home Apartment  Home Access Level entry  Home Layout One level  Bathroom Shower/Tub Tub/shower unit  Research officer, trade union - single point;BSC;Toilet riser;Tub bench;Walker - 4 wheels  Additional Comments has Link to  [employer]  Prior Function  Level of Independence Independent with assistive device(s)  Comments using SPC for mobility in home, uses rollator outside when walks with neighbor, IADL tasks with mod independence  Communication  Communication HOH  Pain Assessment  Pain Assessment Faces  Faces Pain Scale 4  Pain Location back and breathing  Pain Descriptors / Indicators Aching;Grimacing;Guarding  Pain Intervention(s) Monitored during session  Cognition  Arousal/Alertness Awake/alert  Behavior During Therapy [facility] for tasks assessed/performed;Anxious  Overall Cognitive Status Within Functional Limits for tasks assessed  Upper Extremity Assessment  Upper Extremity Assessment Generalized weakness  Lower Extremity Assessment  Lower Extremity Assessment Defer to PT evaluation  Cervical / Trunk Assessment  Cervical / Trunk Assessment Normal  ADL  Overall ADL's  Needs assistance/impaired  Grooming Set up;Sitting  Upper Body Bathing Minimal assistance;Sitting  Lower Body Bathing Moderate assistance;Sitting/lateral leans;Sit to/from stand  Upper Body Dressing  Minimal assistance;Sitting  Lower Body Dressing Moderate assistance;Sitting/lateral leans;Sit to/from Associate Professor Details (indicate cue type and reason) simulated with functional mobility, mod A for steadying and limited activity tolerance  Toileting- Clothing Manipulation and Hygiene Moderate assistance  Functional mobility during ADLs Moderate assistance;Rolling walker  Bed Mobility  Overal bed mobility Needs Assistance  Bed Mobility Supine to Sit;Sit to Supine  Supine to sit Supervision  Sit to supine Supervision  General bed mobility comments pt did not want nor require assistance for sitting up/lying down with HOB raised  Transfers  Overall transfer level Needs assistance  Equipment used Rolling walker (2 wheeled)  Transfers Sit to/from Stand  Sit to Stand Mod assist;+2  safety/equipment;From elevated surface  General transfer comment stood from stretcher and sat back up onto stretcher, noted very SOB  Balance  Overall balance assessment Needs assistance  Sitting-balance support Feet supported;Bilateral upper extremity supported  Sitting balance-Leahy Scale Fair  Standing balance support During functional activity;Bilateral upper extremity supported  Standing balance-Leahy Scale Poor  Standing balance comment steady assistance and use of RW  General Comments  General comments (skin integrity, edema, etc.) patient desaturate to 84% with minimal activity on room air   OT - End of Session  Equipment Utilized During Treatment Rolling walker  Activity Tolerance Patient limited by fatigue  Patient left in bed;with call bell/phone within reach  Nurse Communication Mobility status  OT Assessment  OT Recommendation/Assessment Patient needs continued OT Services  OT Visit Diagnosis Other abnormalities of gait and mobility (R26.89);Muscle weakness (generalized) (M62.81);Pain  Pain - part of body  (back)  OT Problem List Decreased strength;Decreased activity tolerance;Impaired balance (sitting and/or standing);Decreased safety awareness;Pain;Cardiopulmonary status limiting activity  OT Plan  OT Frequency (ACUTE ONLY) Min 2X/week  OT Treatment/Interventions (ACUTE ONLY) Self-care/ADL training;Therapeutic exercise;Balance training;Patient/family education;Therapeutic activities;Energy conservation;DME and/or AE instruction  AM-PAC OT "6 Clicks" Daily Activity Outcome Measure (Version 2)  Help from another person eating meals? 4  Help from another person taking care of personal grooming? 3  Help from another person toileting, which includes using toliet, bedpan, or urinal? 2  Help from another person bathing (including washing, rinsing, drying)? 2  Help from another person to put on and taking off regular upper body clothing? 3  Help from another person to put on and  taking off regular lower body clothing? 2  6 Click Score 16  OT Recommendation  Follow Up Recommendations SNF;Other (comment) (vs HH pending progress)  OT Equipment None recommended by OT  Individuals Consulted  Consulted and Agree with Results and Recommendations Patient  Acute Rehab OT Goals  Patient Stated Goal to go home  OT Goal Formulation With patient  Time For Goal Achievement 12/29/19  Potential to Achieve Goals Good  OT Time Calculation  OT Start Time (ACUTE ONLY) 0815  OT Stop Time (ACUTE ONLY) 0835  OT Time Calculation (min) 20 min  OT General Charges  $OT Visit 1 Visit  OT Evaluation  $OT Eval Low Complexity 1 Low  Written Expression  Dominant Hand Right   Delbert Phenix OT OT pager: [phone]

## 2019-12-15 NOTE — ED Notes (Addendum)
Pt wheeled to/from restroom. Able to perform personal hygiene independently. Became severely hypoxic moving from wheelchair to bed. Lowest O2 sat was 79%. This RN observed gradual increase to normal limits in O2 sat within a few. minutes

## 2019-12-15 NOTE — ED Notes (Signed)
Providers notified via secure chat of pt's HTN state while in the ED. Most recent BP 194/92. Awaiting orders if necessary.

## 2019-12-16 ENCOUNTER — Inpatient Hospital Stay (HOSPITAL_COMMUNITY): Payer: Medicare Other | Admitting: Anesthesiology

## 2019-12-16 ENCOUNTER — Inpatient Hospital Stay (HOSPITAL_COMMUNITY): Payer: Medicare Other

## 2019-12-16 ENCOUNTER — Ambulatory Visit: Payer: Medicare Other | Admitting: Obstetrics & Gynecology

## 2019-12-16 ENCOUNTER — Encounter (HOSPITAL_COMMUNITY): Payer: Self-pay | Admitting: Internal Medicine

## 2019-12-16 ENCOUNTER — Encounter (HOSPITAL_COMMUNITY)
Admission: EM | Disposition: A | Discharge status: Home / Self Care | Payer: Self-pay | Source: Home / Self Care | Attending: Internal Medicine

## 2019-12-16 DIAGNOSIS — R531 Weakness: Secondary | ICD-10-CM | POA: Diagnosis not present

## 2019-12-16 DIAGNOSIS — K317 Polyp of stomach and duodenum: Secondary | ICD-10-CM | POA: Diagnosis not present

## 2019-12-16 HISTORY — PX: ESOPHAGOGASTRODUODENOSCOPY (EGD) WITH PROPOFOL: SHX5813

## 2019-12-16 HISTORY — PX: BIOPSY: SHX5522

## 2019-12-16 LAB — COMPREHENSIVE METABOLIC PANEL
ALT: 12 U/L (ref 0–44)
AST: 14 U/L — ABNORMAL LOW (ref 15–41)
Albumin: 2.7 g/dL — ABNORMAL LOW (ref 3.5–5.0)
Alkaline Phosphatase: 39 U/L (ref 38–126)
Anion gap: 8 (ref 5–15)
BUN: 11 mg/dL (ref 8–23)
CO2: 22 mmol/L (ref 22–32)
Calcium: 8.1 mg/dL — ABNORMAL LOW (ref 8.9–10.3)
Chloride: 98 mmol/L (ref 98–111)
Creatinine, Ser: 0.61 mg/dL (ref 0.44–1.00)
GFR, Estimated: >60 mL/min (ref >60)
Glucose, Bld: 89 mg/dL (ref 70–99)
Potassium: 3.6 mmol/L (ref 3.5–5.1)
Sodium: 128 mmol/L — ABNORMAL LOW (ref 135–145)
Total Bilirubin: 0.6 mg/dL (ref 0.3–1.2)
Total Protein: 6.2 g/dL — ABNORMAL LOW (ref 6.5–8.1)

## 2019-12-16 LAB — LACTATE DEHYDROGENASE, PLEURAL OR PERITONEAL FLUID: LD, Fluid: 635 U/L — ABNORMAL HIGH (ref 3–23)

## 2019-12-16 LAB — BODY FLUID CELL COUNT WITH DIFFERENTIAL
Lymphs, Fluid: 79 %
Monocyte-Macrophage-Serous Fluid: 10 % — ABNORMAL LOW (ref 50–90)
Neutrophil Count, Fluid: 11 % (ref 0–25)
Total Nucleated Cell Count, Fluid: 1417 cu mm — ABNORMAL HIGH (ref 0–1000)

## 2019-12-16 LAB — CBC
HCT: 33.7 % — ABNORMAL LOW (ref 36.0–46.0)
Hemoglobin: 10.7 g/dL — ABNORMAL LOW (ref 12.0–15.0)
MCH: 28.3 pg (ref 26.0–34.0)
MCHC: 31.8 g/dL (ref 30.0–36.0)
MCV: 89.2 fL (ref 80.0–100.0)
Platelets: 454 10*3/uL — ABNORMAL HIGH (ref 150–400)
RBC: 3.78 MIL/uL — ABNORMAL LOW (ref 3.87–5.11)
RDW: 13.2 % (ref 11.5–15.5)
WBC: 7.1 10*3/uL (ref 4.0–10.5)
nRBC: 0 % (ref 0.0–0.2)

## 2019-12-16 LAB — AMYLASE, PLEURAL OR PERITONEAL FLUID: Amylase, Fluid: 39 U/L

## 2019-12-16 LAB — TSH: TSH: 1.835 u[IU]/mL (ref 0.350–4.500)

## 2019-12-16 LAB — IRON AND TIBC
Iron: 26 ug/dL — ABNORMAL LOW (ref 28–170)
Saturation Ratios: 10 % — ABNORMAL LOW (ref 10.4–31.8)
TIBC: 262 ug/dL (ref 250–450)
UIBC: 236 ug/dL

## 2019-12-16 LAB — FERRITIN: Ferritin: 298 ng/mL (ref 11–307)

## 2019-12-16 LAB — MAGNESIUM: Magnesium: 1.9 mg/dL (ref 1.7–2.4)

## 2019-12-16 LAB — PROTEIN, PLEURAL OR PERITONEAL FLUID: Total protein, fluid: 4.4 g/dL

## 2019-12-16 LAB — PHOSPHORUS: Phosphorus: 2.5 mg/dL (ref 2.5–4.6)

## 2019-12-16 LAB — GLUCOSE, PLEURAL OR PERITONEAL FLUID: Glucose, Fluid: 81 mg/dL

## 2019-12-16 SURGERY — ESOPHAGOGASTRODUODENOSCOPY (EGD) WITH PROPOFOL
Anesthesia: Monitor Anesthesia Care

## 2019-12-16 MED ORDER — LIDOCAINE HCL 1 % IJ SOLN
INTRAMUSCULAR | Status: AC
  Filled 2019-12-16: qty 20

## 2019-12-16 MED ORDER — METOPROLOL TARTRATE 25 MG PO TABS
12.5000 mg | ORAL_TABLET | Freq: Two times a day (BID) | ORAL | Status: DC
  Administered 2019-12-16 – 2019-12-24 (×15): 12.5 mg via ORAL
  Filled 2019-12-16 (×17): qty 1

## 2019-12-16 MED ORDER — PROPOFOL 500 MG/50ML IV EMUL
INTRAVENOUS | Status: DC | PRN
  Administered 2019-12-16: 125 ug/kg/min via INTRAVENOUS

## 2019-12-16 MED ORDER — LACTATED RINGERS IV SOLN: INTRAVENOUS | Status: DC | PRN

## 2019-12-16 MED ORDER — SODIUM CHLORIDE 0.9 % IV SOLN: INTRAVENOUS | Status: DC

## 2019-12-16 MED ORDER — LACTATED RINGERS IV SOLN: Freq: Once | INTRAVENOUS | Status: AC

## 2019-12-16 MED ORDER — LIDOCAINE 2% (20 MG/ML) 5 ML SYRINGE
INTRAMUSCULAR | Status: DC | PRN
  Administered 2019-12-16: 60 mg via INTRAVENOUS

## 2019-12-16 MED ORDER — HYDRALAZINE HCL 20 MG/ML IJ SOLN
5.0000 mg | Freq: Four times a day (QID) | INTRAMUSCULAR | Status: DC | PRN
  Administered 2019-12-16: 5 mg via INTRAVENOUS
  Filled 2019-12-16: qty 1

## 2019-12-16 MED ORDER — PROPOFOL 10 MG/ML IV BOLUS
INTRAVENOUS | Status: DC | PRN
  Administered 2019-12-16 (×2): 20 mg via INTRAVENOUS

## 2019-12-16 SURGICAL SUPPLY — 14 items

## 2019-12-16 NOTE — Transfer of Care (Signed)
Immediate Anesthesia Transfer of Care Note  Patient: Penni Homans  Procedure(s) Performed: ESOPHAGOGASTRODUODENOSCOPY (EGD) WITH PROPOFOL (N/A ) BIOPSY  Patient Location: PACU  Anesthesia Type:MAC  Level of Consciousness: awake, alert  and oriented  Airway & Oxygen Therapy: Patient Spontanous Breathing and Patient connected to face mask oxygen  Post-op Assessment: Report given to RN and Post -op Vital signs reviewed and stable  Post vital signs: Reviewed and stable  Last Vitals:  Vitals Value Taken Time  BP    Temp    Pulse 83 12/16/19 1123  Resp 33 12/16/19 1123  SpO2 100 % 12/16/19 1123  Vitals shown include unvalidated device data.  Last Pain:  Vitals:   12/16/19 0943  TempSrc: Oral  PainSc: 0-No pain      Patients Stated Pain Goal: 0 (36/01/65 8006)  Complications: No complications documented.

## 2019-12-16 NOTE — Procedures (Signed)
Ultrasound-guided diagnostic and therapeutic right thoracentesis performed yielding 1.7 liters of bloody fluid. No immediate complications. Follow-up chest x-ray pending. A portion of the fluid was sent to the lab for preordered studies. EBL< 2 cc. Primary MD notified.

## 2019-12-16 NOTE — Progress Notes (Signed)
PROGRESS NOTE    Katie [name]  JIR:[id number] DOB: 15-Sep-1940 DOA: 12/14/2019 PCP: [name], NP    Chief Complaint  Patient presents with  . Weakness  . Dysphagia    Brief Narrative:  Katie [name] is a 79 y.o. female with medical history significant for endometrial cancer 10 years ago with extensive surgery and radiation in 2011, cancer in remission, hypothyroidism, GERD, osteoporosis who presented to [facility] ED as recommended by her PCP due to gradually worsening generalized weakness, poor oral intake and dysphagia with solids and liquids of 2 weeks duration.  Associated with unintentional weight loss, 4 pounds in the last 2 weeks, intermittent diarrhea, nonproductive cough  Subjective:   Had right side thoracentesis earlier this morning Return for EGD, started back on clear liquid per GI recommendation, continue hold Lovenox Blood pressure elevated  Assessment & Plan:   Principal Problem:   Generalized weakness Active Problems:   Acquired hypothyroidism   History of endometrial cancer   Chronic diarrhea   Essential hypertension   Dysphagia   Volume depletion   Gastric polyp  Dysphagia -Status post EGD, source of dysphagia not identified by EGD -Has multiple gastric polyps biopsied, also biopsied esophagus ( normal-appearing esophagus), hold Lovenox for another 24 hours, monitor H&H -GI recommended clear liquid diet, no red liquid, continue PPI twice daily -Swallow eval to evaluate oropharyngeal dysphagia, may need outpatient esophageal manometry if no oropharyngeal dysphagia   Hyponatremia -Appear chronic since November 2020 -Report dysphagia and poor oral intake, continue IV hydration, change from D5 LR to normal saline   Right-sided pleural effusion/acute hypoxic respiratory failure -reported cough, DOE for a while -She has tachypnea since presentation,  desaturation to 84% on room air during OT eval -Ultrasound-guided diagnostic and therapeutic  right thoracentesis performed yielding 1.7 liters of bloody fluid Gram stain no organisms seen , culture in process , elevated LDH , total nucleated cells 1400 with 79% of lymphocytes ,cytology pending -Concerning for metastatic cancer, I have discussed with her about CT chest abdomen, she wanted to wait for cytology before moving to CT imaging    Is diagnosed with endometrial cancer 11 years ago when she turned 59 s/p lap hyst with BSO. Had cholecystectomy at the same.  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  Hypertension No prior diagnosis of this,  For now gave Lopressor  Chronic bilateral lower extremity edema Report not able to tolerate diuretics due to hypotension Currently on exam no significant edema Elevate legs  Normocytic anemia -Hemoglobin 10.7 -No overt sign of external bleed, right-sided pleural effusion is bloody -Monitor hemoglobin  Thrombocytosis Likely reactive  History of cholecystectomy and loose stools She is on cholestyramine PTA, continue Follows with GI outpatient  Hypothyroidism TSH 1.8 ,continue Synthroid  Osteoporosis -Hold Fosamax for now due to GI symptoms  Failure to thrive with poor oral intake ambulatory dysfunction report walks with a walker PT OT Fall precaution  DVT prophylaxis: SCDs for now, Lovenox held per GI recommendation   Code Status: DNR Family Communication: Patient Disposition:   Status is: Inpatient  Dispo: The patient is from: Home              Anticipated d/c is to: Home health versus skilled nursing facility pending progress              Anticipated d/c date is: To be determined                Consultants:  IR for ultrasound-guided thoracentesis  GI for EGD  Procedures:   Ultrasound-guided thoracentesis  EGD  Antimicrobials:   None     Objective: Vitals:   12/16/19 0943 12/16/19 1123 12/16/19 1130 12/16/19 1140  BP: (!) 189/82 (!) 161/79 (!) 177/82 (!)  184/82  Pulse:  83 86 87  Resp: (!) 23 (!) 33 (!) 27 (!) 27  Temp: 98.3 F (36.8 C) 98.1 F (36.7 C)    TempSrc: Oral Axillary    SpO2: 93% 100% 96% 95%  Weight: 60.5 kg     Height:        Intake/Output Summary (Last 24 hours) at 12/16/2019 1334 Last data filed at 12/16/2019 1123 Gross per 24 hour  Intake 1560 ml  Output --  Net 1560 ml   Filed Weights   12/14/19 1644 12/16/19 0943  Weight: 60.3 kg 60.5 kg    Examination:  General exam: thin, calm, NAD, + alopecia, very sharp minded lady Respiratory system: + rhoncus , no wheezing, slight tachypnea, rr 23-27 Cardiovascular system: S1 & S2 heard, RRR. Trace ankle and pedal edema. Gastrointestinal system: Abdomen is nondistended, soft and nontender. Normal bowel sounds heard. Central nervous system: Alert and oriented. No focal neurological deficits. Extremities: generalized weakness,  Skin: No rashes, lesions or ulcers Psychiatry: Judgement and insight appear normal. Mood & affect appropriate.     Data Reviewed: I have personally reviewed following labs and imaging studies  CBC: Recent Labs  Lab 12/14/19 1707 12/15/19 0417 12/16/19 0553  WBC 7.5 7.1 7.1  HGB 11.8* 10.6* 10.7*  HCT 37.4 33.4* 33.7*  MCV 90.1 89.3 89.2  PLT 543* 458* 454*    Basic Metabolic Panel: Recent Labs  Lab 12/14/19 1707 12/15/19 0417 12/16/19 0553  NA 133* 130* 128*  K 4.0 3.7 3.6  CL 95* 98 98  CO2 24 22 22   GLUCOSE 97 103* 89  BUN 15 12 11   CREATININE 0.72 0.61 0.61  CALCIUM 9.6 8.4* 8.1*  MG  --   --  1.9  PHOS  --   --  2.5    GFR: Estimated Creatinine Clearance: 44.8 mL/min (by C-G formula based on SCr of 0.61 mg/dL).  Liver Function Tests: Recent Labs  Lab 12/14/19 1910 12/16/19 0553  AST 16 14*  ALT 13 12  ALKPHOS 49 39  BILITOT 0.9 0.6  PROT 7.3 6.2*  ALBUMIN 3.6 2.7*    CBG: Recent Labs  Lab 12/14/19 1651  GLUCAP 83     Recent Results (from the past 240 hour(s))  Respiratory Panel by RT PCR  (Flu A&B, Covid) - Nasopharyngeal Swab     Status: None   Collection Time: 12/14/19 10:39 PM   Specimen: Nasopharyngeal Swab  Result Value Ref Range Status   SARS Coronavirus 2 by RT PCR NEGATIVE NEGATIVE Final    Comment: (NOTE) SARS-CoV-2 target nucleic acids are NOT DETECTED.  The SARS-CoV-2 RNA is generally detectable in upper respiratoy specimens during the acute phase of infection. The lowest concentration of SARS-CoV-2 viral copies this assay can detect is 131 copies/mL. A negative result does not preclude SARS-Cov-2 infection and should not be used as the sole basis for treatment or other patient management decisions. A negative result may occur with  improper specimen collection/handling, submission of specimen other than nasopharyngeal swab, presence of viral mutation(s) within the areas targeted by this assay, and inadequate number of viral copies (<131 copies/mL). A negative result must be combined with clinical observations, patient history, and epidemiological information. The  expected result is Negative.  Fact Sheet for Patients:  PinkCheek.be  Fact Sheet for Healthcare Providers:  GravelBags.it  This test is no t yet approved or cleared by the [place] FDA and  has been authorized for detection and/or diagnosis of SARS-CoV-2 by FDA under an Emergency Use Authorization (EUA). This EUA will remain  in effect (meaning this test can be used) for the duration of the COVID-19 declaration under Section 564(b)(1) of the Act, 21 U.S.C. section 360bbb-3(b)(1), unless the authorization is terminated or revoked sooner.     Influenza A by PCR NEGATIVE NEGATIVE Final   Influenza B by PCR NEGATIVE NEGATIVE Final    Comment: (NOTE) The Xpert Xpress SARS-CoV-2/FLU/RSV assay is intended as an aid in  the diagnosis of influenza from Nasopharyngeal swab specimens and  should not be used as a sole basis for treatment.  Nasal washings and  aspirates are unacceptable for Xpert Xpress SARS-CoV-2/FLU/RSV  testing.  Fact Sheet for Patients: PinkCheek.be  Fact Sheet for Healthcare Providers: GravelBags.it  This test is not yet approved or cleared by the [place] FDA and  has been authorized for detection and/or diagnosis of SARS-CoV-2 by  FDA under an Emergency Use Authorization (EUA). This EUA will remain  in effect (meaning this test can be used) for the duration of the  Covid-19 declaration under Section 564(b)(1) of the Act, 21  U.S.C. section 360bbb-3(b)(1), unless the authorization is  terminated or revoked. Performed at [facility], [place] [address] [place], [place] [address]   Body fluid culture     Status: None (Preliminary result)   Collection Time: 12/16/19  9:37 AM   Specimen: PATH Cytology Misc. fluid; Pleural Fluid  Result Value Ref Range Status   Specimen Description   Final    PLEURAL Performed at [facility] [address], [place], [place] [address]    Special Requests   Final    NONE Performed at [facility], [employer] [employer] [address] [place], [place] [address]    Gram Stain   Final    RARE WBC PRESENT, PREDOMINANTLY MONONUCLEAR NO ORGANISMS SEEN Performed at [facility], [place] [address], [place], [place] [address]    Culture PENDING  Incomplete   Report Status PENDING  Incomplete         Radiology Studies: DG Chest 1 View  Result Date: 12/16/2019 CLINICAL DATA:  Status post right thoracentesis. EXAM: CHEST  1 VIEW COMPARISON:  December 15, 2019. FINDINGS: The heart size and mediastinal contours are within normal limits. Right pleural effusion is significantly smaller status post thoracentesis. No pneumothorax is noted. Left lung is clear. The visualized skeletal structures are unremarkable. IMPRESSION: Right pleural effusion is significantly smaller  status post thoracentesis. No pneumothorax is noted. Electronically Signed   By: Marijo Conception M.D.   On: 12/16/2019 09:47   DG CHEST PORT 1 VIEW  Result Date: 12/15/2019 CLINICAL DATA:  Inability to eat, possible stricture, lack of intake, history of endometrial cancer EXAM: PORTABLE CHEST 1 VIEW COMPARISON:  Radiograph 06/27/2019 FINDINGS: New moderate to large right pleural effusion with fluid tracking across the lung apex as well. Adjacent opacity likely reflecting some passive atelectatic changes though underlying airspace disease is difficult to fully exclude. Left lung is predominantly clear. Right cardiac margin is largely obscured by opacity. Visible cardiomediastinal contours are unremarkable with a calcified aorta. The osseous structures appear diffusely demineralized which may limit detection of small or nondisplaced fractures. Degenerative changes  are present in the imaged spine and shoulders. High-riding appearance of the humeral heads bilaterally compatible with sequela of rotator cuff insufficiency with evidence of prior right shoulder rotator cuff repair. IMPRESSION: 1. New moderate to large right pleural effusion with fluid tracking across the lung apex. Adjacent opacity likely reflecting some passive atelectatic changes though other underlying processes are difficult to exclude. 2.  Aortic Atherosclerosis (ICD10-I70.0). Electronically Signed   By: Lovena Le M.D.   On: 12/15/2019 00:34   US THORACENTESIS ASP PLEURAL SPACE W/IMG GUIDE  Result Date: 12/16/2019 INDICATION: Patient with remote history of endometrial cancer, weakness, dysphagia, anemia, right pleural effusion. Request received for diagnostic and therapeutic right thoracentesis. EXAM: ULTRASOUND GUIDED DIAGNOSTIC AND THERAPEUTIC RIGHT THORACENTESIS MEDICATIONS: 1% lidocaine to skin and subcutaneous tissue COMPLICATIONS: None immediate. PROCEDURE: An ultrasound guided thoracentesis was thoroughly discussed with the patient  and questions answered. The benefits, risks, alternatives and complications were also discussed. The patient understands and wishes to proceed with the procedure. Written consent was obtained. Ultrasound was performed to localize and mark an adequate pocket of fluid in the right chest. The area was then prepped and draped in the normal sterile fashion. 1% Lidocaine was used for local anesthesia. Under ultrasound guidance a 6 Fr Safe-T-Centesis catheter was introduced. Thoracentesis was performed. The catheter was removed and a dressing applied. FINDINGS: A total of approximately 1.7 liters of bloody fluid was removed. Samples were sent to the laboratory as requested by the clinical team. Primary MD notified of above. IMPRESSION: Successful ultrasound guided diagnostic and therapeutic right thoracentesis yielding 1.7 liters of pleural fluid. Read by: Rowe Robert, PA-C Electronically Signed   By: Sandi Mariscal M.D.   On: 12/16/2019 11:28        Scheduled Meds: . cholestyramine  4 g Oral QHS  . levothyroxine  75 mcg Oral QAC breakfast  . lidocaine      . loratadine  10 mg Oral Daily  . metoprolol tartrate  12.5 mg Oral BID  . pantoprazole (PROTONIX) IV  40 mg Intravenous Q12H  . vitamin B-12  1,000 mcg Oral Daily   Continuous Infusions: . sodium chloride 75 mL/hr at 12/16/19 1052  . dextrose 5% lactated ringers Stopped (12/15/19 1456)     LOS: 1 day   Time spent: 68mins Greater than 50% of this time was spent in counseling, explanation of diagnosis, planning of further management, and coordination of care.  I have personally reviewed and interpreted on  12/16/2019 daily labs, I reviewed all nursing notes, pharmacy notes, consultant notes,  vitals, pertinent old records  I have discussed plan of care as described above with RN , patient on 12/16/2019  Voice Recognition /Dragon dictation system was used to create this note, attempts have been made to correct errors. Please contact the author  with questions and/or clarifications.   Florencia Reasons, MD PhD FACP [facility]  Available via Epic secure chat 7am-7pm for nonurgent issues Please page for urgent issues To page the attending provider between 7A-7P or the covering provider during after hours 7P-7A, please log into the web site [email or web address] and access using universal [place] password for that web site. If you do not have the password, please call the hospital operator.    12/16/2019, 1:34 PM

## 2019-12-16 NOTE — Op Note (Addendum)
[facility] Patient Name: Katie [name] Procedure Date: 12/16/2019 MRN: [id number] Attending MD: Thornton Park MD, MD Date of Birth: 28-Apr-1940 CSN: [id number] Age: 79 Admit Type: Inpatient Procedure:                Upper GI endoscopy Indications:              Dysphagia Providers:                Thornton Park MD, MD, Cleda Daub, RN, Elspeth Cho Tech., Technician, Lesia Sago,                            Technician, [facility], CRNA Referring MD:              Medicines:                Monitored Anesthesia Care Complications:            No immediate complications. Estimated blood loss:                            Minimal. Estimated Blood Loss:     Estimated blood loss was minimal. Procedure:                Pre-Anesthesia Assessment:                           - Prior to the procedure, a History and Physical                            was performed, and patient medications and                            allergies were reviewed. The patient's tolerance of                            previous anesthesia was also reviewed. The risks                            and benefits of the procedure and the sedation                            options and risks were discussed with the patient.                            All questions were answered, and informed consent                            was obtained. Prior Anticoagulants: The patient has                            taken no previous anticoagulant or antiplatelet                            agents. ASA Grade Assessment:  III - A patient with                            severe systemic disease. After reviewing the risks                            and benefits, the patient was deemed in                            satisfactory condition to undergo the procedure.                           After obtaining informed consent, the endoscope was                            passed under  direct vision. Throughout the                            procedure, the patient's blood pressure, pulse, and                            oxygen saturations were monitored continuously. The                            [id number]-H190 ([id number]) Olympus gastroscope was                            introduced through the mouth, and advanced to the                            third part of duodenum. The upper GI endoscopy was                            accomplished without difficulty. The patient                            tolerated the procedure well. Scope In: Scope Out: Findings:      The examined esophagus was normal. No ring, web, stricture, or       esophagitis. Biopsies were taken with from the mid/proximal and distal       esophagus with a cold forceps for histology. Two distal biopsies were       obtained. One biopsy caused a significant rent with some bleeding. The       bleeding had stopped by the end of the procedure.      Multiple small sessile polyps with no bleeding and no stigmata of recent       bleeding were found in the gastric fundus. Biopsies were taken with a       cold forceps for histology. The gastric mucosa otherwise appeared       normal. Estimated blood loss was minimal.      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 Impression:               - Normal esophagus.  Biopsied.                           - Post-esophageal biopsy rent. High risk for                            rebleeding.                           - Multiple gastric polyps. Biopsied.                           - Duodenal diverticulum.                           - Source of dysphagia not identified on this study.                            Await biopsies for eosinophilic esophagitis. Moderate Sedation:      Not Applicable - Patient had care per Anesthesia. Recommendation:           - Return patient to hospital ward for ongoing care.                            - Clear liquid diet. No red liquids. Do not advance                            beyond clear liquids at this time.                           - Continue present medications.                           - Hold Lovenox today given the high risk for                            bleeding.                           - Await pathology results.                           - PPI IV BID.                           - Proceed with evaluation for oropharyngeal                            dysphagia. If negative, will plan outpatient                            esophageal manometry. Procedure Code(s):        --- Professional ---                           [phone], Esophagogastroduodenoscopy, flexible,  transoral; with biopsy, single or multiple Diagnosis Code(s):        --- Professional ---                           K31.7, Polyp of stomach and duodenum                           R13.10, Dysphagia, unspecified CPT copyright 2019 American Medical Association. All rights reserved. The codes documented in this report are preliminary and upon coder review may  be revised to meet current compliance requirements. Thornton Park MD, MD 12/16/2019 11:32:24 AM This report has been signed electronically. Number of Addenda: 0

## 2019-12-16 NOTE — Evaluation (Signed)
SLP Cancellation Note  Patient Details Name: Katie [name] MRN: [id number] DOB: 05/08/1940   Cancelled treatment:       Reason Eval/Treat Not Completed: Other (comment) (pt with MD, will continue efforts)   Macario Golds 12/16/2019, 3:37 PM

## 2019-12-16 NOTE — Anesthesia Postprocedure Evaluation (Signed)
Anesthesia Post Note  Patient: Katie [name]  Procedure(s) Performed: ESOPHAGOGASTRODUODENOSCOPY (EGD) WITH PROPOFOL (N/A ) BIOPSY     Patient location during evaluation: Endoscopy Anesthesia Type: MAC Level of consciousness: awake and alert Pain management: pain level controlled Vital Signs Assessment: post-procedure vital signs reviewed and stable Respiratory status: spontaneous breathing, nonlabored ventilation, respiratory function stable and patient connected to nasal cannula oxygen Cardiovascular status: blood pressure returned to baseline and stable Postop Assessment: no apparent nausea or vomiting Anesthetic complications: no   No complications documented.  Last Vitals:  Vitals:   12/16/19 0943 12/16/19 1123  BP: (!) 189/82 (!) 161/79  Pulse:  83  Resp: (!) 23 (!) 33  Temp: 36.8 C 36.7 C  SpO2: 93% 100%    Last Pain:  Vitals:   12/16/19 1123  TempSrc: Axillary  PainSc: 0-No pain                 Barnet Glasgow

## 2019-12-17 ENCOUNTER — Inpatient Hospital Stay (HOSPITAL_COMMUNITY): Payer: Medicare Other

## 2019-12-17 DIAGNOSIS — R531 Weakness: Secondary | ICD-10-CM | POA: Diagnosis not present

## 2019-12-17 DIAGNOSIS — R1310 Dysphagia, unspecified: Secondary | ICD-10-CM | POA: Diagnosis not present

## 2019-12-17 LAB — BASIC METABOLIC PANEL
Anion gap: 8 (ref 5–15)
BUN: 9 mg/dL (ref 8–23)
CO2: 22 mmol/L (ref 22–32)
Calcium: 7.8 mg/dL — ABNORMAL LOW (ref 8.9–10.3)
Chloride: 98 mmol/L (ref 98–111)
Creatinine, Ser: 0.56 mg/dL (ref 0.44–1.00)
GFR, Estimated: >60 mL/min (ref >60)
Glucose, Bld: 89 mg/dL (ref 70–99)
Potassium: 3.6 mmol/L (ref 3.5–5.1)
Sodium: 128 mmol/L — ABNORMAL LOW (ref 135–145)

## 2019-12-17 LAB — CBC WITH DIFFERENTIAL/PLATELET
Abs Immature Granulocytes: 0.03 10*3/uL (ref 0.00–0.07)
Basophils Absolute: 0 10*3/uL (ref 0.0–0.1)
Basophils Relative: 0 %
Eosinophils Absolute: 0.2 10*3/uL (ref 0.0–0.5)
Eosinophils Relative: 3 %
HCT: 32.1 % — ABNORMAL LOW (ref 36.0–46.0)
Hemoglobin: 10.3 g/dL — ABNORMAL LOW (ref 12.0–15.0)
Immature Granulocytes: 0 %
Lymphocytes Relative: 9 %
Lymphs Abs: 0.6 10*3/uL — ABNORMAL LOW (ref 0.7–4.0)
MCH: 28.6 pg (ref 26.0–34.0)
MCHC: 32.1 g/dL (ref 30.0–36.0)
MCV: 89.2 fL (ref 80.0–100.0)
Monocytes Absolute: 0.8 10*3/uL (ref 0.1–1.0)
Monocytes Relative: 12 %
Neutro Abs: 5.3 10*3/uL (ref 1.7–7.7)
Neutrophils Relative %: 76 %
Platelets: 450 10*3/uL — ABNORMAL HIGH (ref 150–400)
RBC: 3.6 MIL/uL — ABNORMAL LOW (ref 3.87–5.11)
RDW: 13.1 % (ref 11.5–15.5)
WBC: 6.9 10*3/uL (ref 4.0–10.5)
nRBC: 0 % (ref 0.0–0.2)

## 2019-12-17 LAB — SURGICAL PATHOLOGY

## 2019-12-17 LAB — MAGNESIUM: Magnesium: 1.6 mg/dL — ABNORMAL LOW (ref 1.7–2.4)

## 2019-12-17 MED ORDER — IOHEXOL 300 MG/ML  SOLN
100.0000 mL | Freq: Once | INTRAMUSCULAR | Status: AC | PRN
  Administered 2019-12-17: 100 mL via INTRAVENOUS

## 2019-12-17 MED ORDER — MAGNESIUM SULFATE 2 GM/50ML IV SOLN
2.0000 g | Freq: Once | INTRAVENOUS | Status: AC
  Administered 2019-12-17: 2 g via INTRAVENOUS
  Filled 2019-12-17: qty 50

## 2019-12-17 MED ORDER — ENOXAPARIN SODIUM 40 MG/0.4ML ~~LOC~~ SOLN
40.0000 mg | SUBCUTANEOUS | Status: DC
  Administered 2019-12-17 – 2019-12-20 (×4): 40 mg via SUBCUTANEOUS
  Filled 2019-12-17 (×4): qty 0.4

## 2019-12-17 MED ORDER — ACETAMINOPHEN 325 MG PO TABS
650.0000 mg | ORAL_TABLET | Freq: Four times a day (QID) | ORAL | Status: DC | PRN
  Administered 2019-12-17 – 2019-12-24 (×8): 650 mg via ORAL
  Filled 2019-12-17 (×8): qty 2

## 2019-12-17 MED ORDER — PANTOPRAZOLE SODIUM 40 MG PO TBEC
40.0000 mg | DELAYED_RELEASE_TABLET | Freq: Two times a day (BID) | ORAL | Status: DC
  Administered 2019-12-17 – 2019-12-24 (×14): 40 mg via ORAL
  Filled 2019-12-17 (×14): qty 1

## 2019-12-17 MED ORDER — CHOLESTYRAMINE LIGHT 4 G PO PACK
4.0000 g | PACK | Freq: Every day | ORAL | Status: DC
  Administered 2019-12-17 – 2019-12-18 (×2): 4 g via ORAL
  Filled 2019-12-17 (×2): qty 1

## 2019-12-17 NOTE — Evaluation (Signed)
Clinical/Bedside Swallow Evaluation Patient Details  Name: Katie [name] MRN: [id number] Date of Birth: Jan 02, 1941  Today's Date: 12/17/2019 Time:        Past Medical History:  Past Medical History:  Diagnosis Date  . Arthritis   . Cancer [facility])    Endometrial  . Complication of anesthesia    very hard to wake up from Anesthesia- then when wakes up feels like she is going to faint or have vomiting  . Endometrial cancer [facility])    Per [facility] New Patient Packet   . Family history of adverse reaction to anesthesia    daughter has a hard time waking up from Anesthesia  . GERD (gastroesophageal reflux disease)   . Glaucoma   . History of fainting 2021   Per [facility] Patient Packet   . Hypothyroidism   . 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 PONV (postoperative nausea and vomiting)   . Thyroid disease    Past Surgical History:  Past Surgical History:  Procedure Laterality Date  . ABDOMINAL HYSTERECTOMY    . APPENDECTOMY  1958  . BREAST EXCISIONAL BIOPSY Right    benign, was a cyst  . BREAST SURGERY Left    lumpectomy in 1989  . CATARACT EXTRACTION  04/29/2019  . CATARACT EXTRACTION Right 09/2019  . CHOLECYSTECTOMY    . FEMUR SURGERY  06/2013   Dr.Suarez   . ROTATOR CUFF REPAIR  2003   Per [facility] Patient Packet, Dr. Gwenlyn Perking   . TOTAL KNEE ARTHROPLASTY Right 01/05/2019   Procedure: TOTAL KNEE ARTHROPLASTY;  Surgeon: Gaynelle Arabian, MD;  Location: [facility] ORS;  Service: Orthopedics;  Laterality: Right;  61min   HPI:  pt is an 79 yo female adm to [facility] 11/9 with dysphagia.  PMH + for GERD,  remote h/o endometrial cancer s/p treatment, neuropathy from chemo, hypothyroidism, persistant complaint of dysphagia and weakness.  GI MD advised her to go to ED.  Pt is s/p endoscopy showing "Normal esophagus, High risk for rebleeding, Multiple gastric polyps. Biopsied., Duodenal diverticulum, dysphagia not identified on this study,  Await biopsies for eosinophilic esophagitis".  SLP swallow eval ordered.  Pt admits to dysphagia starting approx 2 weeks ago with gradual onset. - even having problems with liquids.  Reports sensing lodging in throat to the point of gagging.  She states currently her swallowing ability has improved.  Reflux diagnosis prior noted and pt reports changing medication from Nexium 40 mg to OTC 20 mg over the last 5-6 years.  Pt did have right moderate pleural effusion with dyspnea on exertion - states this has improved but still has some dyspnea.  She is s/p thoracentesis.   Assessment / Plan / Recommendation Clinical Impression  Pt presents with negative CN exam and strong voice/cough.  She reports improved swallow function since her endoscopy - states it is nearly normal.  Pt seen with intake of eggs, water, and pill with water given by RN.  She does demonstrate subtle cough within 10 seocnds of swallowing water with 2/5 boluses and states this does not occur when she is not ill - cough likely not oropharyngeal related.  No complaint of dysphagia with breakfast meal although pt admits she is taking it easy.    Testing modified based on pt needing to use restroom (diarrhea) x2 during session and SLP assisting to return from bathroom and then back to bathroom.  Reviewed with pt compensation strategies for known GERD  and dysphagia. Reviewed indication of possible pharyngeal vs esophageal dysphagia to help her in delineation in future prn.  Suspect pt's sensation of retention in pharynx and gag/cough/gag/expectoration is due to her esophageal deficits.  Suspect referrant sensation from distal esophagus to pharynx priro to admit.  Pt reports understanding to information reviewed and RN informed as well.  Thanks for this consult. SLP Visit Diagnosis: Dysphagia, unspecified (R13.10)    Aspiration Risk  Mild aspiration risk    Diet Recommendation Regular;Thin liquid   Liquid Administration via: Cup;Straw Medication  Administration: Whole meds with liquid Supervision: Patient able to self feed Compensations: Slow rate;Small sips/bites Postural Changes: Seated upright at 90 degrees;Remain upright for at least 30 minutes after po intake    Other  Recommendations Oral Care Recommendations: Oral care BID   Follow up Recommendations None      Frequency and Duration     n/a       Prognosis   n/a     Swallow Study   General Date of Onset: 12/17/19 HPI: pt is an 79 yo female adm to [facility] 11/9 with dysphagia.  PMH + for GERD,  remote h/o endometrial cancer s/p treatment, neuropathy from chemo, hypothyroidism, persistant complaint of dysphagia and weakness.  GI MD advised her to go to ED.  Pt is s/p endoscopy showing "Normal esophagus, High risk for rebleeding, Multiple gastric polyps. Biopsied., Duodenal diverticulum, dysphagia not identified on this study, Await biopsies for eosinophilic esophagitis".  SLP swallow eval ordered.  Pt admits to dysphagia starting approx 2 weeks ago with gradual onset. - even having problems with liquids.  Reports sensing lodging in throat to the point of gagging.  She states currently her swallowing ability has improved.  Reflux diagnosis prior noted and pt reports changing medication from Nexium 40 mg to OTC 20 mg over the last 5-6 years.  Pt did have right moderate pleural effusion with dyspnea on exertion - states this has improved but still has some dyspnea.  She is s/p thoracentesis. Type of Study: Bedside Swallow Evaluation Previous Swallow Assessment: see hpi Diet Prior to this Study: Regular;Thin liquids Temperature Spikes Noted: No Respiratory Status: Room air History of Recent Intubation: No Behavior/Cognition: Alert;Cooperative;Pleasant mood Oral Cavity Assessment: Within Functional Limits Oral Care Completed by SLP: No Oral Cavity - Dentition: Adequate natural dentition Vision: Functional for self-feeding Self-Feeding Abilities: Able to feed self Patient  Positioning: Upright in bed Baseline Vocal Quality: Normal Volitional Cough: Strong Volitional Swallow: Able to elicit    Oral/Motor/Sensory Function Overall Oral Motor/Sensory Function: Within functional limits   Ice Chips     Thin Liquid Thin Liquid: Impaired Presentation: Cup;Straw Pharyngeal  Phase Impairments: Cough - Delayed Other Comments: pt did not pass 3 ounce water test as she was unable to complete amount of fluid but also with cough (minimal delayed- within 10 seconds) x2/5 boluses    Nectar Thick Nectar Thick Liquid: Not tested   Honey Thick Honey Thick Liquid: Not tested   Puree Puree: Not tested   Solid     Solid: Within functional limits Presentation: Self Fed      Katie [name] 12/17/2019,11:28 AM   Katie Lime, MS [place] Office [phone] Pager [phone]

## 2019-12-17 NOTE — Progress Notes (Signed)
SATURATION QUALIFICATIONS: (This note is used to comply with regulatory documentation for home oxygen)  Patient Saturations on Room Air at Rest = 91%  Patient Saturations on Room Air while Ambulating = 83%  Saturations on O2 while ambulating TBD.  Please briefly explain why patient needs home oxygen: to maintain appropriate SaO2 levels  Philomena Doheny PT 12/17/2019  Acute Rehabilitation Services Pager [phone] Office [phone]

## 2019-12-17 NOTE — Progress Notes (Signed)
Physical Therapy Treatment Patient Details Name: Katie [name] MRN: [id number] DOB: 27-Oct-1940 Today's Date: 12/17/2019    History of Present Illness Daielle Melcher is a 79 y.o. female with medical history significant for endometrial cancer  in remission, hypothyroidism, GERD, osteoporosis who presented to [facility] ED du e to gradually worsening generalized weakness, poor oral intake and dysphagia  of 2 weeks duration,  unintentional weight loss.    PT Comments    Pt ambulated 89' with RW, no loss of balance, distance limited by 4/4 dyspnea. SaO2 91% at rest on room air, 83% on room air with ambulation. Encouraged pursed lip breathing.  Issued incentive spirometer. At present, pt qualifies for home O2. Will need to titrate O2 with ambulation.      Follow Up Recommendations  Home health PT;Supervision/Assistance - 24 hour     Equipment Recommendations  None recommended by PT    Recommendations for Other Services       Precautions / Restrictions Precautions Precautions: Fall Precaution Comments: reports several falls, getting very SOB with activity Restrictions Weight Bearing Restrictions: No    Mobility  Bed Mobility Overal bed mobility: Modified Independent       Supine to sit: Modified independent (Device/Increase time)        Transfers Overall transfer level: Needs assistance Equipment used: Rolling walker (2 wheeled) Transfers: Sit to/from Stand Sit to Stand: Min assist;+2 safety/equipment         General transfer comment: VCs hand placement, min A to power up  Ambulation/Gait Ambulation/Gait assistance: Min guard Gait Distance (Feet): 60 Feet Assistive device: Rolling walker (2 wheeled) Gait Pattern/deviations: Step-through pattern;Decreased stride length     General Gait Details: SaO2 83-85% on room air ambulating, 4/4 dyspnea, distance limited by dyspnea   Stairs             Wheelchair Mobility    Modified Rankin (Stroke Patients  Only)       Balance Overall balance assessment: Needs assistance Sitting-balance support: Feet supported;Bilateral upper extremity supported Sitting balance-Leahy Scale: Fair     Standing balance support: During functional activity;Bilateral upper extremity supported Standing balance-Leahy Scale: Poor Standing balance comment: steady assistnace and use of RW                            Cognition Arousal/Alertness: Awake/alert Behavior During Therapy: WFL for tasks assessed/performed;Anxious Overall Cognitive Status: Within Functional Limits for tasks assessed                                        Exercises      General Comments        Pertinent Vitals/Pain Pain Assessment: No/denies pain    Home Living                      Prior Function            PT Goals (current goals can now be found in the care plan section) Acute Rehab PT Goals Patient Stated Goal: to go home, likes to go out to theater shows PT Goal Formulation: With patient Time For Goal Achievement: 12/29/19 Potential to Achieve Goals: Fair Progress towards PT goals: Progressing toward goals    Frequency    Min 3X/week      PT Plan Current plan remains appropriate    Co-evaluation PT/OT/SLP Co-Evaluation/Treatment: Yes Reason  for Co-Treatment: To address functional/ADL transfers PT goals addressed during session: Mobility/safety with mobility;Proper use of DME        AM-PAC PT "6 Clicks" Mobility   Outcome Measure  Help needed turning from your back to your side while in a flat bed without using bedrails?: A Little Help needed moving from lying on your back to sitting on the side of a flat bed without using bedrails?: A Little Help needed moving to and from a bed to a chair (including a wheelchair)?: A Little Help needed standing up from a chair using your arms (e.g., wheelchair or bedside chair)?: A Little Help needed to walk in hospital room?: A  Little Help needed climbing 3-5 steps with a railing? : A Lot 6 Click Score: 17    End of Session Equipment Utilized During Treatment: Gait belt Activity Tolerance: Treatment limited secondary to medical complications (Comment);Patient limited by fatigue (hypoxia with activity) Patient left: with call bell/phone within reach;in chair;with family/visitor present;with chair alarm set Nurse Communication: Mobility status PT Visit Diagnosis: History of falling (Z91.81);Difficulty in walking, not elsewhere classified (R26.2)     Time: 8299-3716 PT Time Calculation (min) (ACUTE ONLY): 21 min  Charges:  $Gait Training: 8-22 mins                    Blondell Reveal Kistler PT 12/17/2019  Acute Rehabilitation Services Pager [phone] Office [phone]

## 2019-12-17 NOTE — Progress Notes (Signed)
PROGRESS NOTE    [name]  NUU:[id number] DOB: 28-Jan-1941 DOA: 12/14/2019 PCP: [name], NP    Chief Complaint  Patient presents with   Weakness   Dysphagia    Brief Narrative:  Katie [name] is a 79 y.o. female with medical history significant for endometrial cancer 10 years ago with extensive surgery and radiation in 2011, cancer in remission, hypothyroidism, GERD, osteoporosis who presented to [facility] ED as recommended by her PCP due to gradually worsening generalized weakness, poor oral intake and dysphagia with solids and liquids of 2 weeks duration.  Associated with unintentional weight loss, 4 pounds in the last 2 weeks, intermittent diarrhea, nonproductive cough  Subjective:  She is sitting up in chair, denies pain, continue to have intermittent dry cough She reports swallow breakfast better this morning Daughter at bedside   Assessment & Plan:   Principal Problem:   Generalized weakness Active Problems:   Acquired hypothyroidism   History of endometrial cancer   Chronic diarrhea   Essential hypertension   Dysphagia   Volume depletion   Gastric polyp  Dysphagia -Status post EGD, source of dysphagia not identified by EGD -Has multiple gastric polyps biopsied, also biopsied esophagus ( normal-appearing esophagus), hold Lovenox for another 24 hours, monitor H&H -diet perGI, continue PPI twice daily -Swallow eval to evaluate oropharyngeal dysphagia, may need outpatient esophageal manometry if no oropharyngeal dysphagia   Hyponatremia, euvolemic to dry -Appear chronic since November 2020 -she now has better oral intake, will hold off ivf, plan to check urine sodium/osm once off ivf, 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   Right-sided pleural effusion/acute hypoxic respiratory failure/chronic cough started April this year -reported cough, DOE for a while -She has tachypnea since presentation,  desaturation to 84% on room air during OT  eval -Ultrasound-guided diagnostic and therapeutic right thoracentesis performed yielding 1.7 liters of bloody fluid Gram stain no organisms seen , culture in process , elevated LDH , total nucleated cells 1400 with 79% of lymphocytes ,cytology pending -Concerning for metastatic cancer, she agrees to CT chest abdomen pelvis today, cytology pending -will need home o2, daughter requesting pulm consult    Is diagnosed with endometrial cancer 11 years ago when she turned 24 s/p lap hyst with BSO. Had cholecystectomy at the same.  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  Hypertension No prior diagnosis of this,  For now gave Lopressor  Chronic bilateral lower extremity edema Report not able to tolerate diuretics due to hypotension Currently on exam no significant edema Elevate legs  Normocytic anemia -Hemoglobin 10.7 -No overt sign of external bleed, right-sided pleural effusion is bloody -Monitor hemoglobin  Thrombocytosis Likely reactive, improving  History of cholecystectomy and loose stools She is on cholestyramine PTA, continue Follows with GI outpatient  Hypothyroidism TSH 1.8 ,continue Synthroid  Osteoporosis -Hold Fosamax for now due to GI symptoms  Failure to thrive with poor oral intake ambulatory dysfunction report walks with a walker PT OT Fall precaution  DVT prophylaxis:  Lovenox held per GI recommendation, GI oked to resume lovenox today    Code Status: DNR Family Communication: daughter at bedside Disposition:   Status is: Inpatient  Dispo: The patient is from: Home              Anticipated d/c is to: Home health               Anticipated d/c date is: To be determined  Consultants:   IR for ultrasound-guided thoracentesis  GI for EGD  Procedures:   Ultrasound-guided thoracentesis  EGD  Antimicrobials:   None     Objective: Vitals:   12/16/19 2058 12/17/19 0208 12/17/19 0701  12/17/19 1024  BP: (!) 150/97 (!) 154/88 (!) 148/82 (!) 155/80  Pulse: 98 96 84   Resp: 19  18   Temp: 98 F (36.7 C)  98.5 F (36.9 C)   TempSrc: Oral  Oral   SpO2: 92% 93% 91%   Weight:      Height:        Intake/Output Summary (Last 24 hours) at 12/17/2019 1241 Last data filed at 12/17/2019 1100 Gross per 24 hour  Intake 2152.68 ml  Output 200 ml  Net 1952.68 ml   Filed Weights   12/14/19 1644 12/16/19 0943  Weight: 60.3 kg 60.5 kg    Examination:  General exam: thin, calm, NAD, + alopecia, very sharp minded lady Respiratory system: + rhoncus , no wheezing, tachypnea has resolved Cardiovascular system: S1 & S2 heard, RRR. Trace ankle and pedal edema. Gastrointestinal system: Abdomen is nondistended, soft and nontender. Normal bowel sounds heard. Central nervous system: Alert and oriented. No focal neurological deficits. Extremities: generalized weakness,  Skin: No rashes, lesions or ulcers Psychiatry: Judgement and insight appear normal. Mood & affect appropriate.     Data Reviewed: I have personally reviewed following labs and imaging studies  CBC: Recent Labs  Lab 12/14/19 1707 12/15/19 0417 12/16/19 0553 12/17/19 0527  WBC 7.5 7.1 7.1 6.9  NEUTROABS  --   --   --  5.3  HGB 11.8* 10.6* 10.7* 10.3*  HCT 37.4 33.4* 33.7* 32.1*  MCV 90.1 89.3 89.2 89.2  PLT 543* 458* 454* 450*    Basic Metabolic Panel: Recent Labs  Lab 12/14/19 1707 12/15/19 0417 12/16/19 0553 12/17/19 0527  NA 133* 130* 128* 128*  K 4.0 3.7 3.6 3.6  CL 95* 98 98 98  CO2 24 22 22 22   GLUCOSE 97 673* 89 89  BUN 15 12 11 9   CREATININE 0.72 0.61 0.61 0.56  CALCIUM 9.6 8.4* 8.1* 7.8*  MG  --   --  1.9 1.6*  PHOS  --   --  2.5  --     GFR: Estimated Creatinine Clearance: 44.8 mL/min (by C-G formula based on SCr of 0.56 mg/dL).  Liver Function Tests: Recent Labs  Lab 12/14/19 1910 12/16/19 0553  AST 16 14*  ALT 13 12  ALKPHOS 49 39  BILITOT 0.9 0.6  PROT 7.3 6.2*   ALBUMIN 3.6 2.7*    CBG: Recent Labs  Lab 12/14/19 1651  GLUCAP 83     Recent Results (from the past 240 hour(s))  Respiratory Panel by RT PCR (Flu A&B, Covid) - Nasopharyngeal Swab     Status: None   Collection Time: 12/14/19 10:39 PM   Specimen: Nasopharyngeal Swab  Result Value Ref Range Status   SARS Coronavirus 2 by RT PCR NEGATIVE NEGATIVE Final    Comment: (NOTE) SARS-CoV-2 target nucleic acids are NOT DETECTED.  The SARS-CoV-2 RNA is generally detectable in upper respiratoy specimens during the acute phase of infection. The lowest concentration of SARS-CoV-2 viral copies this assay can detect is 131 copies/mL. A negative result does not preclude SARS-Cov-2 infection and should not be used as the sole basis for treatment or other patient management decisions. A negative result may occur with  improper specimen collection/handling, submission of specimen other than nasopharyngeal swab, presence of viral  mutation(s) within the areas targeted by this assay, and inadequate number of viral copies (<131 copies/mL). A negative result must be combined with clinical observations, patient history, and epidemiological information. The expected result is Negative.  Fact Sheet for Patients:  PinkCheek.be  Fact Sheet for Healthcare Providers:  GravelBags.it  This test is no t yet approved or cleared by the [place] FDA and  has been authorized for detection and/or diagnosis of SARS-CoV-2 by FDA under an Emergency Use Authorization (EUA). This EUA will remain  in effect (meaning this test can be used) for the duration of the COVID-19 declaration under Section 564(b)(1) of the Act, 21 U.S.C. section 360bbb-3(b)(1), unless the authorization is terminated or revoked sooner.     Influenza A by PCR NEGATIVE NEGATIVE Final   Influenza B by PCR NEGATIVE NEGATIVE Final    Comment: (NOTE) The Xpert Xpress  SARS-CoV-2/FLU/RSV assay is intended as an aid in  the diagnosis of influenza from Nasopharyngeal swab specimens and  should not be used as a sole basis for treatment. Nasal washings and  aspirates are unacceptable for Xpert Xpress SARS-CoV-2/FLU/RSV  testing.  Fact Sheet for Patients: PinkCheek.be  Fact Sheet for Healthcare Providers: GravelBags.it  This test is not yet approved or cleared by the [place] FDA and  has been authorized for detection and/or diagnosis of SARS-CoV-2 by  FDA under an Emergency Use Authorization (EUA). This EUA will remain  in effect (meaning this test can be used) for the duration of the  Covid-19 declaration under Section 564(b)(1) of the Act, 21  U.S.C. section 360bbb-3(b)(1), unless the authorization is  terminated or revoked. Performed at [facility], [place] [address] [place], [place] [address]   Body fluid culture     Status: None (Preliminary result)   Collection Time: 12/16/19  9:37 AM   Specimen: PATH Cytology Misc. fluid; Pleural Fluid  Result Value Ref Range Status   Specimen Description   Final    PLEURAL Performed at [place] [address], [place], [place] [address]    Special Requests   Final    NONE Performed at [facility], [place] [address] [place], [place] [address]    Gram Stain   Final    RARE WBC PRESENT, PREDOMINANTLY MONONUCLEAR NO ORGANISMS SEEN    Culture   Final    NO GROWTH < 24 HOURS Performed at [facility], [place] [address], [place],  [address]    Report Status PENDING  Incomplete         Radiology Studies: DG Chest 1 View  Result Date: 12/16/2019 CLINICAL DATA:  Status post right thoracentesis. EXAM: CHEST  1 VIEW COMPARISON:  December 15, 2019. FINDINGS: The heart size and mediastinal contours are within normal limits. Right pleural effusion is significantly smaller  status post thoracentesis. No pneumothorax is noted. Left lung is clear. The visualized skeletal structures are unremarkable. IMPRESSION: Right pleural effusion is significantly smaller status post thoracentesis. No pneumothorax is noted. Electronically Signed   By: Marijo Conception M.D.   On: 12/16/2019 09:47   US THORACENTESIS ASP PLEURAL SPACE W/IMG GUIDE  Result Date: 12/16/2019 INDICATION: Patient with remote history of endometrial cancer, weakness, dysphagia, anemia, right pleural effusion. Request received for diagnostic and therapeutic right thoracentesis. EXAM: ULTRASOUND GUIDED DIAGNOSTIC AND THERAPEUTIC RIGHT THORACENTESIS MEDICATIONS: 1% lidocaine to skin and subcutaneous tissue COMPLICATIONS: None immediate. PROCEDURE: An ultrasound guided thoracentesis was thoroughly discussed with the patient and questions answered.  The benefits, risks, alternatives and complications were also discussed. The patient understands and wishes to proceed with the procedure. Written consent was obtained. Ultrasound was performed to localize and mark an adequate pocket of fluid in the right chest. The area was then prepped and draped in the normal sterile fashion. 1% Lidocaine was used for local anesthesia. Under ultrasound guidance a 6 Fr Safe-T-Centesis catheter was introduced. Thoracentesis was performed. The catheter was removed and a dressing applied. FINDINGS: A total of approximately 1.7 liters of bloody fluid was removed. Samples were sent to the laboratory as requested by the clinical team. Primary MD notified of above. IMPRESSION: Successful ultrasound guided diagnostic and therapeutic right thoracentesis yielding 1.7 liters of pleural fluid. Read by: Rowe Robert, PA-C Electronically Signed   By: Sandi Mariscal M.D.   On: 12/16/2019 11:28        Scheduled Meds:  cholestyramine  4 g Oral QHS   levothyroxine  75 mcg Oral QAC breakfast   loratadine  10 mg Oral Daily   metoprolol tartrate  12.5 mg Oral  BID   pantoprazole (PROTONIX) IV  40 mg Intravenous Q12H   vitamin B-12  1,000 mcg Oral Daily   Continuous Infusions:    LOS: 2 days   Time spent: 57mins Greater than 50% of this time was spent in counseling, explanation of diagnosis, planning of further management, and coordination of care.  I have personally reviewed and interpreted on  12/17/2019 daily labs, I reviewed all nursing notes, pharmacy notes, consultant notes,  vitals, pertinent old records  I have discussed plan of care as described above with RN , patient on 12/17/2019  Voice Recognition /Dragon dictation system was used to create this note, attempts have been made to correct errors. Please contact the author with questions and/or clarifications.   Florencia Reasons, MD PhD FACP [facility]  Available via Epic secure chat 7am-7pm for nonurgent issues Please page for urgent issues To page the attending provider between 7A-7P or the covering provider during after hours 7P-7A, please log into the web site [email or web address] and access using universal [place] password for that web site. If you do not have the password, please call the hospital operator.    12/17/2019, 12:41 PM

## 2019-12-17 NOTE — Progress Notes (Signed)
Progress Note   Subjective  Chief Complaint: Dysphagia  EGD 12/16/2019 with normal esophagus, post esophageal biopsy went, high risk for rebleeding, multiple gastric polyps and duodenal diverticulum, source of dysphagia not identified.  Today, the patient tells me that she is hungry to eat something more than clear liquids, tells me that she had some soup when she first and was able to tolerate this is she feels so. Aware of findings from EGD yesterday. Explained plans for speech pathology to evaluate her further. Denies any new complaints or concerns.   Objective   Vital signs in last 24 hours: Temp:  [98 F (36.7 C)-98.5 F (36.9 C)] 98.5 F (36.9 C) (11/11 0701) Pulse Rate:  [83-98] 84 (11/11 0701) Resp:  [18-33] 18 (11/11 0701) BP: (148-184)/(79-97) 148/82 (11/11 0701) SpO2:  [91 %-100 %] 91 % (11/11 0701) Last BM Date: 12/14/19 General:    Elderly white female in NAD Heart:  Regular rate and rhythm; no murmurs Lungs: Respirations even and unlabored, lungs CTA bilaterally Abdomen:  Soft, nontender and nondistended. Normal bowel sounds. Extremities:  Without edema. Neurologic:  Alert and oriented,  grossly normal neurologically. Psych:  Cooperative. Normal mood and affect.  Intake/Output from previous day: 11/10 0701 - 11/11 0700 In: 2202.7 [I.V.:2202.7] Out: 200 [Urine:200]  Lab Results: Recent Labs    12/15/19 0417 12/16/19 0553 12/17/19 0527  WBC 7.1 7.1 6.9  HGB 10.6* 10.7* 10.3*  HCT 33.4* 33.7* 32.1*  PLT 458* 454* 450*   BMET Recent Labs    12/15/19 0417 12/16/19 0553 12/17/19 0527  NA 130* 128* 128*  K 3.7 3.6 3.6  CL 98 98 98  CO2 22 22 22   GLUCOSE 103* 89 89  BUN 12 11 9   CREATININE 0.61 0.61 0.56  CALCIUM 8.4* 8.1* 7.8*   LFT Recent Labs    12/14/19 1910 12/14/19 1910 12/16/19 0553  PROT 7.3   < > 6.2*  ALBUMIN 3.6   < > 2.7*  AST 16   < > 14*  ALT 13   < > 12  ALKPHOS 49   < > 39  BILITOT 0.9   < > 0.6  BILIDIR 0.1  --   --     IBILI 0.8  --   --    < > = values in this interval not displayed.   Studies/Results: DG Chest 1 View  Result Date: 12/16/2019 CLINICAL DATA:  Status post right thoracentesis. EXAM: CHEST  1 VIEW COMPARISON:  December 15, 2019. FINDINGS: The heart size and mediastinal contours are within normal limits. Right pleural effusion is significantly smaller status post thoracentesis. No pneumothorax is noted. Left lung is clear. The visualized skeletal structures are unremarkable. IMPRESSION: Right pleural effusion is significantly smaller status post thoracentesis. No pneumothorax is noted. Electronically Signed   By: Marijo Conception M.D.   On: 12/16/2019 09:47   US THORACENTESIS ASP PLEURAL SPACE W/IMG GUIDE  Result Date: 12/16/2019 INDICATION: Patient with remote history of endometrial cancer, weakness, dysphagia, anemia, right pleural effusion. Request received for diagnostic and therapeutic right thoracentesis. EXAM: ULTRASOUND GUIDED DIAGNOSTIC AND THERAPEUTIC RIGHT THORACENTESIS MEDICATIONS: 1% lidocaine to skin and subcutaneous tissue COMPLICATIONS: None immediate. PROCEDURE: An ultrasound guided thoracentesis was thoroughly discussed with the patient and questions answered. The benefits, risks, alternatives and complications were also discussed. The patient understands and wishes to proceed with the procedure. Written consent was obtained. Ultrasound was performed to localize and mark an adequate pocket of fluid in the right chest.  The area was then prepped and draped in the normal sterile fashion. 1% Lidocaine was used for local anesthesia. Under ultrasound guidance a 6 Fr Safe-T-Centesis catheter was introduced. Thoracentesis was performed. The catheter was removed and a dressing applied. FINDINGS: A total of approximately 1.7 liters of bloody fluid was removed. Samples were sent to the laboratory as requested by the clinical team. Primary MD notified of above. IMPRESSION: Successful ultrasound  guided diagnostic and therapeutic right thoracentesis yielding 1.7 liters of pleural fluid. Read by: Rowe Robert, PA-C Electronically Signed   By: Sandi Mariscal M.D.   On: 12/16/2019 11:28       Assessment / Plan:   Assessment: 1. Dysphagia: EGD with no clear source on 12/16/2019, thought oropharyngeal in origin 2. Nonproductive cough 3. Generalized weakness 4. Mild normocytic anemia 5. History of endometrial cancer in remission 6. History of cholecystectomy and loose stools  Plan: 1. Discussed results from EGD with the patient today. Answered all of her questions. Biopsies are still pending. 2. Agree with plan for speech pathology to evaluate further with a swallow study. 3. Continue IV PPI 4. Place the patient on a regular diet this morning, told her to be observant of what she is eating and try to pick mostly soft foods and things that are easy to chew. 5. Please await further recommendations from Dr. Tarri Glenn later today  Thank you for kind consultation.   LOS: 2 days   Levin Erp  12/17/2019, 9:48 AM

## 2019-12-17 NOTE — Progress Notes (Signed)
Occupational Therapy Progress Note  Patient reports feeling much better than previous session, had over liter of fluid drained from lungs and reports easier time breathing. At rest patient maintaining low 90s on room air however with functional ambulation and LB dressing patient desaturate to mid 80s and becomes short of breath. Patient min A to power up to standing and min A with functional ambulation using rolling walker. Mod cues for pursed lip breathing carry over with activity.     12/17/19 1500  OT Visit Information  Last OT Received On 12/17/19  Assistance Needed +1  PT/OT/SLP Co-Evaluation/Treatment Yes  Reason for Co-Treatment To address functional/ADL transfers  OT goals addressed during session ADL's and self-care  History of Present Illness Katie [name] is a 79 y.o. female with medical history significant for endometrial cancer  in remission, hypothyroidism, GERD, osteoporosis who presented to [facility] ED du e to gradually worsening generalized weakness, poor oral intake and dysphagia  of 2 weeks duration,  unintentional weight loss.  Precautions  Precautions Fall  Precaution Comments reports several falls, getting very SOB with activity  Pain Assessment  Pain Assessment Faces  Faces Pain Scale 0  Cognition  Arousal/Alertness Awake/alert  Behavior During Therapy WFL for tasks assessed/performed;Anxious  Overall Cognitive Status Within Functional Limits for tasks assessed  ADL  Overall ADL's  Needs assistance/impaired  Lower Body Dressing Supervision/safety;Sitting/lateral leans  Lower Body Dressing Details (indicate cue type and reason) to bend over at EOB to don slippers  Toilet Transfer Minimal assistance;+2 for safety/equipment;Ambulation;RW  Toilet Transfer Details (indicate cue type and reason) simulated with ambulation then transfer to recliner,   Functional mobility during ADLs Minimal assistance;+2 for safety/equipment;Cueing for safety;Cueing for sequencing  General  ADL Comments patient with improved activity tolerance reports over a liter of fluid drained from lungs. Patient desaturate with functional ambulation to mid 80s on room air requiring cues for pursed lip breathing. recovers to low 90s seated in reclienr  Bed Mobility  Overal bed mobility Modified Independent  Balance  Overall balance assessment Needs assistance  Sitting-balance support Feet supported  Sitting balance-Leahy Scale Good  Standing balance support During functional activity;Bilateral upper extremity supported  Standing balance-Leahy Scale Poor  Transfers  Overall transfer level Needs assistance  Equipment used Rolling walker (2 wheeled)  Transfers Sit to/from Stand  Sit to Stand Min assist;+2 safety/equipment  General transfer comment VCs hand placement, min A to power up  General Comments  General comments (skin integrity, edema, etc.) please see ADL section  OT - End of Session  Equipment Utilized During Treatment Rolling walker  Activity Tolerance Patient tolerated treatment well  Patient left in chair;with call bell/phone within reach;with family/visitor present  Nurse Communication Mobility status  OT Assessment/Plan  OT Plan Discharge plan needs to be updated  OT Visit Diagnosis Other abnormalities of gait and mobility (R26.89);Muscle weakness (generalized) (M62.81)  OT Frequency (ACUTE ONLY) Min 2X/week  Follow Up Recommendations Home health OT;Supervision/Assistance - 24 hour  OT Equipment None recommended by OT  AM-PAC OT "6 Clicks" Daily Activity Outcome Measure (Version 2)  Help from another person eating meals? 4  Help from another person taking care of personal grooming? 3  Help from another person toileting, which includes using toliet, bedpan, or urinal? 3  Help from another person bathing (including washing, rinsing, drying)? 3  Help from another person to put on and taking off regular upper body clothing? 3  Help from another person to put on and taking  off regular lower  body clothing? 3  6 Click Score 19  OT Goal Progression  Progress towards OT goals Progressing toward goals  Acute Rehab OT Goals  Patient Stated Goal to go home, likes to go out to theater shows  OT Goal Formulation With patient  Time For Goal Achievement 12/29/19  Potential to Achieve Goals Good  OT Time Calculation  OT Start Time (ACUTE ONLY) 1144  OT Stop Time (ACUTE ONLY) 1207  OT Time Calculation (min) 23 min  OT General Charges  $OT Visit 1 Visit  OT Treatments  $Self Care/Home Management  8-22 mins   Delbert Phenix OT OT pager: [phone]

## 2019-12-18 ENCOUNTER — Encounter (HOSPITAL_COMMUNITY): Payer: Self-pay | Admitting: Internal Medicine

## 2019-12-18 DIAGNOSIS — C541 Malignant neoplasm of endometrium: Secondary | ICD-10-CM

## 2019-12-18 DIAGNOSIS — R1310 Dysphagia, unspecified: Secondary | ICD-10-CM

## 2019-12-18 DIAGNOSIS — Z8542 Personal history of malignant neoplasm of other parts of uterus: Secondary | ICD-10-CM

## 2019-12-18 DIAGNOSIS — D649 Anemia, unspecified: Secondary | ICD-10-CM

## 2019-12-18 DIAGNOSIS — G620 Drug-induced polyneuropathy: Secondary | ICD-10-CM

## 2019-12-18 DIAGNOSIS — J90 Pleural effusion, not elsewhere classified: Secondary | ICD-10-CM

## 2019-12-18 DIAGNOSIS — M810 Age-related osteoporosis without current pathological fracture: Secondary | ICD-10-CM

## 2019-12-18 DIAGNOSIS — Z7189 Other specified counseling: Secondary | ICD-10-CM

## 2019-12-18 DIAGNOSIS — E871 Hypo-osmolality and hyponatremia: Secondary | ICD-10-CM

## 2019-12-18 DIAGNOSIS — D75839 Thrombocytosis, unspecified: Secondary | ICD-10-CM

## 2019-12-18 DIAGNOSIS — J910 Malignant pleural effusion: Secondary | ICD-10-CM

## 2019-12-18 DIAGNOSIS — E039 Hypothyroidism, unspecified: Secondary | ICD-10-CM

## 2019-12-18 DIAGNOSIS — R918 Other nonspecific abnormal finding of lung field: Secondary | ICD-10-CM | POA: Diagnosis not present

## 2019-12-18 DIAGNOSIS — T451X5A Adverse effect of antineoplastic and immunosuppressive drugs, initial encounter: Secondary | ICD-10-CM

## 2019-12-18 DIAGNOSIS — R531 Weakness: Secondary | ICD-10-CM

## 2019-12-18 HISTORY — DX: Other specified counseling: Z71.89

## 2019-12-18 HISTORY — DX: Malignant neoplasm of endometrium: C54.1

## 2019-12-18 HISTORY — DX: Malignant pleural effusion: J91.0

## 2019-12-18 LAB — OSMOLALITY, URINE: Osmolality, Ur: 635 mOsm/kg (ref 300–900)

## 2019-12-18 LAB — SODIUM, URINE, RANDOM: Sodium, Ur: 23 mmol/L

## 2019-12-18 LAB — CYTOLOGY - NON PAP

## 2019-12-18 LAB — PH, BODY FLUID: pH, Body Fluid: 7.4

## 2019-12-18 MED ORDER — MEGESTROL ACETATE 40 MG PO TABS
160.0000 mg | ORAL_TABLET | Freq: Every day | ORAL | Status: DC
  Administered 2019-12-18 – 2019-12-24 (×7): 160 mg via ORAL
  Filled 2019-12-18 (×8): qty 4

## 2019-12-18 MED ORDER — MAGNESIUM SULFATE 2 GM/50ML IV SOLN
2.0000 g | Freq: Once | INTRAVENOUS | Status: AC
  Administered 2019-12-18: 2 g via INTRAVENOUS
  Filled 2019-12-18: qty 50

## 2019-12-18 NOTE — TOC Initial Note (Signed)
Transition of Care [facility]) - Initial/Assessment Note    Patient Details  Name: Katie [name] MRN: [id number] Date of Birth: February 13, 1940  Transition [facility]) CM/SW Contact:    Lynnell Catalan, RN Phone Number: 12/18/2019, 2:43 PM  Clinical Narrative:                 Pt from home alone. She has used [facility] for home health services previously and would like to use them again. Will need MD orders for PT/OT/RN. [facility] liaison contacted for referral. Pt states She has a RW, BSC, and tub bench at home. No other DME requested.  Expected Discharge Plan: [place] Barriers to Discharge: Continued Medical Work up   Patient Goals and CMS Choice Patient states their goals for this hospitalization and ongoing recovery are:: To get home CMS Medicare.gov Compare Post Acute Care list provided to:: Patient Choice offered to / list presented to : Patient  Expected Discharge Plan and Services Expected Discharge Plan: [place]   Discharge Planning Services: CM Consult Post Acute Care Choice: [place] arrangements for the past 2 months: Apartment                           HH Arranged: RN, PT, OT HH Agency: [facility] [place] Date [place]: 12/18/19 Time HH Agency Contacted: 30 Representative spoke with at [place]: [place] Arrangements/Services Living arrangements for the past 2 months: [place] with:: Self Patient language and need for interpreter reviewed:: Yes Do you feel safe going back to the place where you live?: Yes      Need for Family Participation in Patient Care: Yes (Comment) Care giver support system in place?: Yes (comment)   Criminal Activity/Legal Involvement Pertinent to Current Situation/Hospitalization: No - Comment as needed  Activities of Daily Living Home Assistive Devices/Equipment: Walker (specify type), Eyeglasses, Bedside commode/3-in-1, Hearing aid (upper denture,  front wheeled walker, single point cane, tub/shower unit, bilateral hearing aids, tub bench) ADL Screening (condition at time of admission) Patient's cognitive ability adequate to safely complete daily activities?: Yes Is the patient deaf or have difficulty hearing?: Yes (wears bilateral hearing aids) Does the patient have difficulty seeing, even when wearing glasses/contacts?: No Does the patient have difficulty concentrating, remembering, or making decisions?: No Patient able to express need for assistance with ADLs?: Yes Does the patient have difficulty dressing or bathing?: No Independently performs ADLs?: No Communication: Independent Dressing (OT): Independent Grooming: Independent Feeding: Independent Bathing: Independent Toileting: Needs assistance Is this a change from baseline?: Change from baseline, expected to last >3days In/Out Bed: Needs assistance Is this a change from baseline?: Change from baseline, expected to last >3 days Walks in Home: Needs assistance Is this a change from baseline?: Change from baseline, expected to last >3 days Does the patient have difficulty walking or climbing stairs?: Yes Weakness of Legs: Both Weakness of Arms/Hands: None  Permission Sought/Granted   Permission granted to share information with : Yes, Verbal Permission Granted     Permission granted to share info w AGENCY: [facility]        Emotional Assessment Appearance:: Appears stated age Attitude/Demeanor/Rapport: Engaged Affect (typically observed): Calm Orientation: : Oriented to Self, Oriented to Place, Oriented to  Time, Oriented to Situation Alcohol / Substance Use: Not Applicable Psych Involvement: No (comment)  Admission diagnosis:  Cough [R05.9] Pleural effusion [J90] Generalized weakness [R53.1] Dysphagia [R13.10] Patient Active  Problem List   Diagnosis Date Noted  . Gastric polyp   . Dysphagia 12/15/2019  . Volume depletion 12/15/2019  . Generalized weakness  12/14/2019  . PVD (peripheral vascular disease) ([place]) 11/05/2019  . Syncope and collapse 06/27/2019  . Reactive airway disease 06/15/2019  . Muscle cramps 06/15/2019  . Essential hypertension 06/15/2019  . Elevated BP without diagnosis of hypertension 05/29/2019  . Stress and adjustment reaction 05/29/2019  . Iron deficiency 02/23/2019  . B12 deficiency 02/23/2019  . Hospital discharge follow-up 02/20/2019  . Anemia 02/20/2019  . 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 Vitamin D deficiency 09/15/2018  . Pain due to onychomycosis of toenails of both feet 08/05/2018  . Osteoporosis without current pathological fracture 03/17/2018  . Healthcare maintenance 03/17/2018  . Elevated LDL cholesterol level 03/17/2018  . History of total hip arthroplasty, left 01/23/2018  . Cellulitis of right upper extremity 09/26/2017  . Left flank pain 08/26/2017  . OA (osteoarthritis) of knee 06/03/2017  . Loose stools 05/30/2017  . Chronic diarrhea 05/30/2017  . 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 Osteoarthritis of right knee 05/27/2017  . Labyrinthitis 04/18/2017  . Need for assistance due to unsteady gait 04/18/2017  . Age-related osteoporosis without current pathological fracture 01/23/2017  . Acquired hypothyroidism 01/23/2017  . History of endometrial cancer 01/23/2017   PCP:  [name], NP Pharmacy:   [facility] [phone] [address], [address] AT [facility] [address] [place] [address] Phone: [phone] Fax: [phone]     Social Determinants of Health ([place]) Interventions    Readmission Risk Interventions No flowsheet data found.

## 2019-12-18 NOTE — Consult Note (Addendum)
Katie [name]  [phone] [phone]   MEDICAL ONCOLOGY - INITIAL CONSULTATION  Referral MD: Dr. Florencia Reasons  Reason for Referral: Right lower lobe lung mass with extension into the right hilum and subcarinal region concerning for malignancy, right pleural effusion, history of endometrial cancer  HPI: Ms. Katie [name] is an 79 year old female with a past medical history significant for endometrial cancer 10 years ago status post surgery and radiation, hypothyroidism, GERD, osteoporosis.  The patient presented to the emergency room with generalized weakness and difficulty swallowing.  Labs on admission significant for a hemoglobin of 11.8, sodium 133.  Chest x-ray showed a new moderate to large right pleural effusion with fluid tracking across the lung apex.  Underwent ultrasound-guided thoracentesis on 12/16/2019 with 1.7 L of fluid removed.  Sent for cytology which is currently pending.  A CT chest/abdomen/pelvis with performed on 12/17/2019 which showed a right lower lobe mass extending into the right hilum and subcarinal region most consistent with malignancy, high-grade narrowing of the right middle and right lower bronchi secondary to mass-effect and compression or infiltration of the mass, moderate right pleural effusion with partial compressive atelectasis of the right lower lobe, right upper lobe pleural thickening likely metastasis.  The patient has been seen by GI this admission and underwent EGD on 12/16/2019.  No masses were noted.  Biopsies were taken which are negative for malignancy.  The patient was seen and her daughter who was a Designer, jewellery was at the bedside.  The patient reports mild improvement in her shortness of breath since thoracentesis.  The patient has had a nonproductive cough for at least 2 weeks.  Cough has worsened.  Due to difficulty swallowing, the patient had a poor appetite and has lost 4 pounds over the past 2 weeks.  She denies  headaches, dizziness, vision changes.  No chest pain reported.  Denies abdominal pain.  She is not having any nausea or vomiting today.  She has baseline diarrhea due to her prior cancer treatment.  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  The patient reported one episode of blood in the toilet about a month ago.  She was not sure if the blood was from her vagina or in her urine.  This has not recurred.  She also has baseline chronic lower extremity edema.  The patient reports that she was diagnosed with endometrial cancer in September 2010.  She had extensive surgery followed by chemotherapy which was then followed by radiation and brachytherapy.  While her treatment was performed in [place].  She reports that she has been followed annually to monitor for recurrence.  She was due for her annual appointment this month.  At her annual visit, she had a chest x-ray, GYN exam, and a CA-125. Medical oncology was asked see the patient to make recommendations regarding her abnormal CT scan findings.   Past Medical History:  Diagnosis Date  . Arthritis   . Cancer [facility])    Endometrial  . Complication of anesthesia    very hard to wake up from Anesthesia- then when wakes up feels like she is going to faint or have vomiting  . Endometrial cancer [facility])    Per [facility] New Patient Packet   . Family history of adverse reaction to anesthesia    daughter has a hard time waking up from Anesthesia  . GERD (gastroesophageal reflux disease)   . Glaucoma   . History of fainting 2021  Per [facility] New Patient Packet   . Hypothyroidism   . 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 PONV (postoperative nausea and vomiting)   . Thyroid disease   :  Past Surgical History:  Procedure Laterality Date  . ABDOMINAL HYSTERECTOMY    . APPENDECTOMY  1958  . BREAST EXCISIONAL BIOPSY Right    benign, was a cyst  . BREAST SURGERY Left     lumpectomy in 1989  . CATARACT EXTRACTION  04/29/2019  . CATARACT EXTRACTION Right 09/2019  . CHOLECYSTECTOMY    . FEMUR SURGERY  06/2013   Dr.Suarez   . ROTATOR CUFF REPAIR  2003   Per [place] Patient Packet, Dr. Gwenlyn Perking   . TOTAL KNEE ARTHROPLASTY Right 01/05/2019   Procedure: TOTAL KNEE ARTHROPLASTY;  Surgeon: Gaynelle Arabian, MD;  Location: [facility] ORS;  Service: Orthopedics;  Laterality: Right;  50min  :  Current Facility-Administered Medications  Medication Dose Route Frequency Provider Last Rate Last Admin  . acetaminophen (TYLENOL) tablet 650 mg  650 mg Oral Q6H PRN Lang Snow, NP   650 mg at 12/17/19 2239  . cholestyramine light (PREVALITE) packet 4 g  4 g Oral QHS Lenis Noon, RPH   4 g at 12/17/19 2142  . enoxaparin (LOVENOX) injection 40 mg  40 mg Subcutaneous Q24H Florencia Reasons, MD   40 mg at 12/17/19 2142  . hydrALAZINE (APRESOLINE) injection 5 mg  5 mg Intravenous Q6H PRN Florencia Reasons, MD   5 mg at 12/16/19 1904  . levothyroxine (SYNTHROID) tablet 75 mcg  75 mcg Oral QAC breakfast Kayleen Memos, DO   75 mcg at 12/18/19 0526  . loratadine (CLARITIN) tablet 10 mg  10 mg Oral Daily Irene Pap N, DO   10 mg at 12/18/19 0933  . magnesium sulfate IVPB 2 g 50 mL  2 g Intravenous Once Florencia Reasons, MD 50 mL/hr at 12/18/19 1143 2 g at 12/18/19 1143  . metoprolol tartrate (LOPRESSOR) tablet 12.5 mg  12.5 mg Oral BID Florencia Reasons, MD   12.5 mg at 12/18/19 0933  . ondansetron (ZOFRAN) injection 4 mg  4 mg Intravenous Q6H PRN Pokhrel, Laxman, MD      . pantoprazole (PROTONIX) EC tablet 40 mg  40 mg Oral BID Thornton Park, MD   40 mg at 12/18/19 0933  . vitamin B-12 (CYANOCOBALAMIN) tablet 1,000 mcg  1,000 mcg Oral Daily Irene Pap N, DO   1,000 mcg at 12/18/19 4403     Allergies  Allergen Reactions  . Sulfa Antibiotics Rash  . Gabapentin Other (See Comments)    Depressed   . Lyrica [Pregabalin] Other (See Comments)    unknown  . Paxil [Paroxetine]     unknown  :  Family  History  Problem Relation Age of Onset  . Arthritis Mother   . Osteoporosis Mother   . Heart disease Father   . Hearing loss Maternal Grandmother   . Hearing loss Maternal Grandfather   . Cancer Paternal Grandfather   . Cancer Sister   :  Social History   Socioeconomic History  . Marital status: Widowed    Spouse name: Not on file  . Number of children: Not on file  . Years of education: Not on file  . Highest education level: Not on file  Occupational History  . Occupation: retired  Tobacco Use  . Smoking status: Never Smoker  . Smokeless tobacco: Never Used  Vaping Use  . Vaping Use: Never used  Substance and Sexual Activity  . Alcohol use: No  . Drug use: No  . Sexual activity: Not on file  Other Topics Concern  . Not on file  Social History Narrative   Right handed   One story apartment   One cup coffee daily and tea      Per [place] New Patient Packet Abstracted on 07/13/2019 by Chrae Beatty/CMA      Diet: left blank      Caffeine: Yes      Married, if yes what year: Widowed, married in [place] you live in a house, apartment, assisted living, condo, trailer, ect: Apartment, 1 person      Is it one or more stories: Yes, patient lives on ground floor       Pets: No      Current/Past profession: Optometrist      Highest level or education completed: [employer]       Exercise:     Not enough            Type and how often:          Living Will: Yes   DNR: Yes    POA/HPOA: Yes      Functional Status:   Do you have difficulty bathing or dressing yourself? Left blank   Do you have difficulty preparing food or eating? Left blank   Do you have difficulty managing your medications? Left blank   Do you have difficulty managing your finances? Left blank   Do you have difficulty affording your medications? Left blank      Social Determinants of Health   Financial Resource Strain:   . Difficulty of Paying Living Expenses: Not on file  Food Insecurity:    . Worried About Charity fundraiser in the Last Year: Not on file  . Ran Out of Food in the Last Year: Not on file  Transportation Needs:   . Lack of Transportation (Medical): Not on file  . Lack of Transportation (Non-Medical): Not on file  Physical Activity:   . Days of Exercise per Week: Not on file  . Minutes of Exercise per Session: Not on file  Stress:   . Feeling of Stress : Not on file  Social Connections:   . Frequency of Communication with Friends and Family: Not on file  . Frequency of Social Gatherings with Friends and Family: Not on file  . Attends Religious Services: Not on file  . Active Member of Clubs or Organizations: Not on file  . Attends Archivist Meetings: Not on file  . Marital Status: Not on file  Intimate Partner Violence:   . Fear of Current or Ex-Partner: Not on file  . Emotionally Abused: Not on file  . Physically Abused: Not on file  . Sexually Abused: Not on file  :  Review of Systems: A comprehensive 14 point review of systems was negative except as noted in the HPI.  Exam: Patient Vitals for the past 24 hrs:  BP Temp Temp src Pulse Resp SpO2  12/18/19 0933 (!) 151/83 -- -- 88 -- --  12/18/19 0512 (!) 157/82 98 F (36.7 C) Oral 80 16 91 %  12/17/19 2004 (!) 166/86 99.3 F (37.4 C) Oral 97 16 90 %  12/17/19 1346 (!) 160/82 98.4 F (36.9 C) Oral 81 15 91 %  12/17/19 1300 -- -- -- -- -- (!) 83 %  General: Awake and alert, no distress. Eyes: PERRL, no scleral icterus.   ENT:  There were no oropharyngeal lesions.     Lymphatics:  Negative cervical, supraclavicular or axillary adenopathy.   Respiratory: Rhonchi and wheezing in the right upper and right middle lung fields, diminished in the right base.  Left clear. Cardiovascular:  Regular rate and rhythm, S1/S2, without murmur, rub or gallop.  1+ bilateral lower extremity edema. GI:  abdomen was soft, flat, nontender, nondistended, without organomegaly.   Musculoskeletal: Strength  symmetrical in the upper and lower extremities. Skin exam was without echymosis, petichae.   Neuro exam was nonfocal. Patient was alert and oriented.  Attention was good.   Language was appropriate.  Mood was normal without depression.  Speech was not pressured.  Thought content was not tangential.     Lab Results  Component Value Date   WBC 6.9 12/17/2019   HGB 10.3 (L) 12/17/2019   HCT 32.1 (L) 12/17/2019   PLT 450 (H) 12/17/2019   GLUCOSE 89 12/17/2019   LDLDIRECT 109.0 03/17/2018   ALT 12 12/16/2019   AST 14 (L) 12/16/2019   NA 128 (L) 12/17/2019   K 3.6 12/17/2019   CL 98 12/17/2019   CREATININE 0.56 12/17/2019   BUN 9 12/17/2019   CO2 22 12/17/2019    DG Chest 1 View  Result Date: 12/16/2019 CLINICAL DATA:  Status post right thoracentesis. EXAM: CHEST  1 VIEW COMPARISON:  December 15, 2019. FINDINGS: The heart size and mediastinal contours are within normal limits. Right pleural effusion is significantly smaller status post thoracentesis. No pneumothorax is noted. Left lung is clear. The visualized skeletal structures are unremarkable. IMPRESSION: Right pleural effusion is significantly smaller status post thoracentesis. No pneumothorax is noted. Electronically Signed   By: Marijo Conception M.D.   On: 12/16/2019 09:47   CT CHEST ABDOMEN PELVIS W CONTRAST  Result Date: 12/17/2019 CLINICAL DATA:  79 year old female with unintentional weight loss. History of endometrial cancer. EXAM: CT CHEST, ABDOMEN, AND PELVIS WITH CONTRAST TECHNIQUE: Multidetector CT imaging of the chest, abdomen and pelvis was performed following the standard protocol during bolus administration of intravenous contrast. CONTRAST:  123mL OMNIPAQUE IOHEXOL 300 MG/ML  SOLN COMPARISON:  CT abdomen pelvis dated 07/11/2017. FINDINGS: CT CHEST FINDINGS Cardiovascular: There is no cardiomegaly or pericardial effusion. There is 3 vessel coronary vascular calcification. There is calcification of the mitral annulus. There  is moderate atherosclerotic calcification of the thoracic aorta. No aneurysmal dilatation or dissection. The origins of the great vessels of the aortic arch appear patent as visualized. Mediastinum/Nodes: Right hilar mass extending into the subcarinal region measuring 4.1 x 2.3 cm in greatest axial dimension and 6 cm in craniocaudal length. Several subcentimeter but mildly rounded lymph node noted in the prevascular space. The esophagus is grossly unremarkable. Lungs/Pleura: There is a masslike consolidation in the superior segment of the right lower lobe which is contiguous with the right hilar mass. The portion of the mass in the right lower lobe measures 5.2 x 2.0 cm in greatest axial dimensions (49/6). There is high-grade narrowing of the right middle and right lower lobe bronchi secondary to mass effect and compression or infiltration of the mass. There are small clusters of ground-glass opacity in the right upper lobe as well as right lower lobe. A 2.4 x 1.8 cm focal density in the right lower lobe (coronal 76/4) likely extension of tumor versus pneumonia. There is a moderate right pleural effusion with partial compressive atelectasis of the right  lower lobe. There is nodular and irregular thickening of the right upper lobe pleural surface consistent with pleural metastasis. The left lung is clear. There is no pneumothorax. Musculoskeletal: Degenerative changes of the spine. No acute osseous pathology. CT ABDOMEN PELVIS FINDINGS No intra-abdominal free air. Trace free fluid in the pelvis. Hepatobiliary: The liver is unremarkable. No intrahepatic biliary ductal dilatation. Cholecystectomy. Pancreas: Unremarkable. No pancreatic ductal dilatation or surrounding inflammatory changes. Spleen: Normal in size without focal abnormality. Adrenals/Urinary Tract: The adrenal glands unremarkable. There is no hydronephrosis on either side. There is symmetric enhancement and excretion of contrast by both kidneys. There is a  1 cm right renal interpolar cyst. The visualized ureters and urinary bladder appear unremarkable. Stomach/Bowel: There is sigmoid diverticulosis as well as diverticula of the ascending colon. Mild pericolonic stranding adjacent to the ascending colonic diverticula may be related to general mesenteric edema or mild diverticulitis. Clinical correlation is recommended. No abscess or perforation. There is a 3 cm duodenal diverticulum at the head of the pancreas as well as a 2 cm distal duodenal diverticula. There is no bowel obstruction. Vascular/Lymphatic: Advanced aortoiliac atherosclerotic disease. The IVC is unremarkable. No portal venous gas. There is no adenopathy. There is a 5 mm gastrohepatic lymph node. Reproductive: Hysterectomy. No adnexal masses. Other: Mild subcutaneous edema. There is streak artifact caused by left hip hardware limiting evaluation of the pelvic structures. Musculoskeletal: Osteopenia with degenerative changes of the spine and scoliosis. No acute osseous pathology. Left femoral screw. IMPRESSION: 1. Right lower lobe mass extending into the right hilum and subcarinal region most consistent with malignancy. There is high-grade narrowing of the right middle and right lower lobe bronchi secondary to mass effect and compression or infiltration of the mass. 2. Moderate right pleural effusion with partial compressive atelectasis of the right lower lobe. 3. Right upper lobe pleural thickening, likely metastasis. 4. Colonic diverticulosis. Mild pericolonic stranding adjacent to the ascending colonic diverticula may be related to general mesenteric edema or mild diverticulitis. Clinical correlation is recommended. No abscess or perforation. 5. Aortic Atherosclerosis (ICD10-I70.0). Electronically Signed   By: Anner Crete M.D.   On: 12/17/2019 19:35   DG CHEST PORT 1 VIEW  Result Date: 12/15/2019 CLINICAL DATA:  Inability to eat, possible stricture, lack of intake, history of endometrial  cancer EXAM: PORTABLE CHEST 1 VIEW COMPARISON:  Radiograph 06/27/2019 FINDINGS: New moderate to large right pleural effusion with fluid tracking across the lung apex as well. Adjacent opacity likely reflecting some passive atelectatic changes though underlying airspace disease is difficult to fully exclude. Left lung is predominantly clear. Right cardiac margin is largely obscured by opacity. Visible cardiomediastinal contours are unremarkable with a calcified aorta. The osseous structures appear diffusely demineralized which may limit detection of small or nondisplaced fractures. Degenerative changes are present in the imaged spine and shoulders. High-riding appearance of the humeral heads bilaterally compatible with sequela of rotator cuff insufficiency with evidence of prior right shoulder rotator cuff repair. IMPRESSION: 1. New moderate to large right pleural effusion with fluid tracking across the lung apex. Adjacent opacity likely reflecting some passive atelectatic changes though other underlying processes are difficult to exclude. 2.  Aortic Atherosclerosis (ICD10-I70.0). Electronically Signed   By: Lovena Le M.D.   On: 12/15/2019 00:34   US THORACENTESIS ASP PLEURAL SPACE W/IMG GUIDE  Result Date: 12/16/2019 INDICATION: Patient with remote history of endometrial cancer, weakness, dysphagia, anemia, right pleural effusion. Request received for diagnostic and therapeutic right thoracentesis. EXAM: ULTRASOUND GUIDED DIAGNOSTIC AND THERAPEUTIC RIGHT  THORACENTESIS MEDICATIONS: 1% lidocaine to skin and subcutaneous tissue COMPLICATIONS: None immediate. PROCEDURE: An ultrasound guided thoracentesis was thoroughly discussed with the patient and questions answered. The benefits, risks, alternatives and complications were also discussed. The patient understands and wishes to proceed with the procedure. Written consent was obtained. Ultrasound was performed to localize and mark an adequate pocket of fluid in  the right chest. The area was then prepped and draped in the normal sterile fashion. 1% Lidocaine was used for local anesthesia. Under ultrasound guidance a 6 Fr Safe-T-Centesis catheter was introduced. Thoracentesis was performed. The catheter was removed and a dressing applied. FINDINGS: A total of approximately 1.7 liters of bloody fluid was removed. Samples were sent to the laboratory as requested by the clinical team. Primary MD notified of above. IMPRESSION: Successful ultrasound guided diagnostic and therapeutic right thoracentesis yielding 1.7 liters of pleural fluid. Read by: Rowe Robert, PA-C Electronically Signed   By: Sandi Mariscal M.D.   On: 12/16/2019 11:28     DG Chest 1 View  Result Date: 12/16/2019 CLINICAL DATA:  Status post right thoracentesis. EXAM: CHEST  1 VIEW COMPARISON:  December 15, 2019. FINDINGS: The heart size and mediastinal contours are within normal limits. Right pleural effusion is significantly smaller status post thoracentesis. No pneumothorax is noted. Left lung is clear. The visualized skeletal structures are unremarkable. IMPRESSION: Right pleural effusion is significantly smaller status post thoracentesis. No pneumothorax is noted. Electronically Signed   By: Marijo Conception M.D.   On: 12/16/2019 09:47   CT CHEST ABDOMEN PELVIS W CONTRAST  Result Date: 12/17/2019 CLINICAL DATA:  79 year old female with unintentional weight loss. History of endometrial cancer. EXAM: CT CHEST, ABDOMEN, AND PELVIS WITH CONTRAST TECHNIQUE: Multidetector CT imaging of the chest, abdomen and pelvis was performed following the standard protocol during bolus administration of intravenous contrast. CONTRAST:  178mL OMNIPAQUE IOHEXOL 300 MG/ML  SOLN COMPARISON:  CT abdomen pelvis dated 07/11/2017. FINDINGS: CT CHEST FINDINGS Cardiovascular: There is no cardiomegaly or pericardial effusion. There is 3 vessel coronary vascular calcification. There is calcification of the mitral annulus. There is  moderate atherosclerotic calcification of the thoracic aorta. No aneurysmal dilatation or dissection. The origins of the great vessels of the aortic arch appear patent as visualized. Mediastinum/Nodes: Right hilar mass extending into the subcarinal region measuring 4.1 x 2.3 cm in greatest axial dimension and 6 cm in craniocaudal length. Several subcentimeter but mildly rounded lymph node noted in the prevascular space. The esophagus is grossly unremarkable. Lungs/Pleura: There is a masslike consolidation in the superior segment of the right lower lobe which is contiguous with the right hilar mass. The portion of the mass in the right lower lobe measures 5.2 x 2.0 cm in greatest axial dimensions (49/6). There is high-grade narrowing of the right middle and right lower lobe bronchi secondary to mass effect and compression or infiltration of the mass. There are small clusters of ground-glass opacity in the right upper lobe as well as right lower lobe. A 2.4 x 1.8 cm focal density in the right lower lobe (coronal 76/4) likely extension of tumor versus pneumonia. There is a moderate right pleural effusion with partial compressive atelectasis of the right lower lobe. There is nodular and irregular thickening of the right upper lobe pleural surface consistent with pleural metastasis. The left lung is clear. There is no pneumothorax. Musculoskeletal: Degenerative changes of the spine. No acute osseous pathology. CT ABDOMEN PELVIS FINDINGS No intra-abdominal free air. Trace free fluid in the pelvis. Hepatobiliary:  The liver is unremarkable. No intrahepatic biliary ductal dilatation. Cholecystectomy. Pancreas: Unremarkable. No pancreatic ductal dilatation or surrounding inflammatory changes. Spleen: Normal in size without focal abnormality. Adrenals/Urinary Tract: The adrenal glands unremarkable. There is no hydronephrosis on either side. There is symmetric enhancement and excretion of contrast by both kidneys. There is a 1  cm right renal interpolar cyst. The visualized ureters and urinary bladder appear unremarkable. Stomach/Bowel: There is sigmoid diverticulosis as well as diverticula of the ascending colon. Mild pericolonic stranding adjacent to the ascending colonic diverticula may be related to general mesenteric edema or mild diverticulitis. Clinical correlation is recommended. No abscess or perforation. There is a 3 cm duodenal diverticulum at the head of the pancreas as well as a 2 cm distal duodenal diverticula. There is no bowel obstruction. Vascular/Lymphatic: Advanced aortoiliac atherosclerotic disease. The IVC is unremarkable. No portal venous gas. There is no adenopathy. There is a 5 mm gastrohepatic lymph node. Reproductive: Hysterectomy. No adnexal masses. Other: Mild subcutaneous edema. There is streak artifact caused by left hip hardware limiting evaluation of the pelvic structures. Musculoskeletal: Osteopenia with degenerative changes of the spine and scoliosis. No acute osseous pathology. Left femoral screw. IMPRESSION: 1. Right lower lobe mass extending into the right hilum and subcarinal region most consistent with malignancy. There is high-grade narrowing of the right middle and right lower lobe bronchi secondary to mass effect and compression or infiltration of the mass. 2. Moderate right pleural effusion with partial compressive atelectasis of the right lower lobe. 3. Right upper lobe pleural thickening, likely metastasis. 4. Colonic diverticulosis. Mild pericolonic stranding adjacent to the ascending colonic diverticula may be related to general mesenteric edema or mild diverticulitis. Clinical correlation is recommended. No abscess or perforation. 5. Aortic Atherosclerosis (ICD10-I70.0). Electronically Signed   By: Anner Crete M.D.   On: 12/17/2019 19:35   DG CHEST PORT 1 VIEW  Result Date: 12/15/2019 CLINICAL DATA:  Inability to eat, possible stricture, lack of intake, history of endometrial cancer  EXAM: PORTABLE CHEST 1 VIEW COMPARISON:  Radiograph 06/27/2019 FINDINGS: New moderate to large right pleural effusion with fluid tracking across the lung apex as well. Adjacent opacity likely reflecting some passive atelectatic changes though underlying airspace disease is difficult to fully exclude. Left lung is predominantly clear. Right cardiac margin is largely obscured by opacity. Visible cardiomediastinal contours are unremarkable with a calcified aorta. The osseous structures appear diffusely demineralized which may limit detection of small or nondisplaced fractures. Degenerative changes are present in the imaged spine and shoulders. High-riding appearance of the humeral heads bilaterally compatible with sequela of rotator cuff insufficiency with evidence of prior right shoulder rotator cuff repair. IMPRESSION: 1. New moderate to large right pleural effusion with fluid tracking across the lung apex. Adjacent opacity likely reflecting some passive atelectatic changes though other underlying processes are difficult to exclude. 2.  Aortic Atherosclerosis (ICD10-I70.0). Electronically Signed   By: Lovena Le M.D.   On: 12/15/2019 00:34   US THORACENTESIS ASP PLEURAL SPACE W/IMG GUIDE  Result Date: 12/16/2019 INDICATION: Patient with remote history of endometrial cancer, weakness, dysphagia, anemia, right pleural effusion. Request received for diagnostic and therapeutic right thoracentesis. EXAM: ULTRASOUND GUIDED DIAGNOSTIC AND THERAPEUTIC RIGHT THORACENTESIS MEDICATIONS: 1% lidocaine to skin and subcutaneous tissue COMPLICATIONS: None immediate. PROCEDURE: An ultrasound guided thoracentesis was thoroughly discussed with the patient and questions answered. The benefits, risks, alternatives and complications were also discussed. The patient understands and wishes to proceed with the procedure. Written consent was obtained. Ultrasound was performed to  localize and mark an adequate pocket of fluid in the  right chest. The area was then prepped and draped in the normal sterile fashion. 1% Lidocaine was used for local anesthesia. Under ultrasound guidance a 6 Fr Safe-T-Centesis catheter was introduced. Thoracentesis was performed. The catheter was removed and a dressing applied. FINDINGS: A total of approximately 1.7 liters of bloody fluid was removed. Samples were sent to the laboratory as requested by the clinical team. Primary MD notified of above. IMPRESSION: Successful ultrasound guided diagnostic and therapeutic right thoracentesis yielding 1.7 liters of pleural fluid. Read by: Rowe Robert, PA-C Electronically Signed   By: Sandi Mariscal M.D.   On: 12/16/2019 11:28   Assessment and Plan:  This is an 79 year old female with: 1.  Right lung mass with extension into the right hilum and subcarinal region with right pleural effusion 2.  History of endometrial cancer diagnosed in September 2010 and treated in [place], [place] 3.  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  Normocytic anemia 5.  Thrombocytosis 6.  Hyponatremia 7.  Dysphagia 8.  Chronic bilateral lower extremity edema 9.  Hypothyroidism 10.  Osteoporosis  -Discussed CT scan findings with the patient and her daughter.  Fluid from thoracentesis has been sent and cytology is pending.  We discussed that lung mass could be metastasis from prior endometrial cancer versus a new primary cancer.  She has no smoking history.  Will await cytology to discuss further recommendations for work-up and treatment. -Anemia likely due to underlying malignancy and a component of iron deficiency.  May consider administration of IV or oral iron. -Thrombocytosis is reactive.  Monitor for now. -Work-up of hyponatremia per hospitalist. -Dysphagia secondary to her lung mass.  Has been evaluated by speech therapy.  Recommend dietitian consult.  Thank you for this referral.   Mikey Bussing, DNP, AGPCNP-BC, AOCNP  ADDENDUM: I received a  call from pathology after my visit with the patient.  Cytology results from 12/16/2019 showed malignant cells consistent with metastatic adenocarcinoma.  IHC testing positive for ER (80% moderate staining) and PAX 8.  Negative for Napsin-A, CDX2, calretinin, and WT-1.  TTF-1 reveals occasional weak staining cells.  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  These results will be discussed with the patient and her daughter later today with Dr. Marin Olp sees the patient.  ADDENDUM: I saw and examined Ms. Katie [name]. She is very nice. She is originally from [place].  Her daughter was on the mobile phone. Her daughter is actually a Designer, jewellery over at [employer] [employer] [facility] [employer] [facility] [employer] in Pediatrics.  Ms. Avans has a history of what sounds like stage III uterine cancer. This was up in [place]. She had a hysterectomy. She is then treated with radiation and chemotherapy. She has had some permanent side effects. She has neuropathy. Her hair has not fully come back. Carrolyn Meiers that she was treated with carboplatinum/Taxol or docetaxel.  She has been down in [name] for about 3 years.  She now has what appears to be recurrent disease. She had fluid taken out from the right pleural space. The tumor appears to be consistent with a GYN primary.  She is 79 years old. Looks like her performance status is not that great. I know she is a tough lady.  I suspect that we might need to get a biopsy of this hilar mass. I think we might need to get an actual tissue so we have sent off for molecular markers to see if  we might be able to use targeted therapy or immunotherapy.  I would see if pulmonary can evaluate her and see if they feel that a bronchoscopy could be done and biopsies could be taken.  1 option for treatment would be endocrine therapy. Tamoxifen alternating with Megace has been shown to be somewhat effective.  I cannot think of any other studies that we can do. Maybe a  CA-125 might be helpful.  We are clearly looking at quality of life as a primary goal. She is in agreement. I see that she is a DO NOT RESUSCITATE. I certainly cannot argue with this.  We will follow her along. I know that she will get outstanding care from all the staff on 6 E.  Katie Haw, MD  [place] [phone]

## 2019-12-18 NOTE — Progress Notes (Addendum)
PROGRESS NOTE    Katie [name]  WUJ:[id number] DOB: 1940-11-29 DOA: 12/14/2019 PCP: [name], NP    Chief Complaint  Patient presents with  . Weakness  . Dysphagia    Brief Narrative:  Katie [name] is a 79 y.o. female with medical history significant for endometrial cancer 10 years ago with extensive surgery and radiation in 2011, cancer in remission, hypothyroidism, GERD, osteoporosis who presented to [facility] ED as recommended by her PCP due to gradually worsening generalized weakness, poor oral intake and dysphagia with solids and liquids of 2 weeks duration.  Associated with unintentional weight loss, 4 pounds in the last 2 weeks, intermittent diarrhea, nonproductive cough  Subjective:  She denies pain, continue to have intermittent dry cough, significant DOE returned from bath room to her bed. She reports swallowing is better    Assessment & Plan:   Principal Problem:   Generalized weakness Active Problems:   Acquired hypothyroidism   History of endometrial cancer   Chronic diarrhea   Essential hypertension   Dysphagia   Volume depletion   Gastric polyp  Dysphagia -Status post EGD, source of dysphagia not identified by EGD -Has multiple gastric polyps biopsied, also biopsied esophagus ( normal-appearing esophagus), hold Lovenox for another 24 hours, monitor H&H -diet perGI, continue PPI twice daily -Swallow eval to evaluate oropharyngeal dysphagia, may need outpatient esophageal manometry if no oropharyngeal dysphagia -dysphagia possibly due to metastatic cancer in lung cause compression to esophagus   Hyponatremia, euvolemic to dry -Appear chronic since November 2020 -she now has better oral intake, will hold off ivf, plan to check urine sodium/osm /serum sodium/osm, uric acid level, once off ivf, 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 -urine sodium may not be reliable due to poor oral intake    Right-sided pleural effusion/acute hypoxic respiratory  failure/chronic cough started April this year -reported cough, DOE for a while -She has tachypnea since presentation,  desaturation to 84% on room air during OT eval -Ultrasound-guided diagnostic and therapeutic right thoracentesis performed yielding 1.7 liters of bloody fluid Gram stain no organisms seen , culture no growth, elevated LDH , total nucleated cells 1400 with 79% of lymphocytes ,cytology pending -Concerning for metastatic cancer, she agrees to CT chest abdomen pelvis which unfortunately showed findings concerning for primary lung cancer versus metastatic cancer, case discussed with pulmonology who recommend wait for  cytology result, if nondiagnostic may need bronc for biopsy, case discussed with oncology who is going to see patient in consult -will likely need home o2    Is diagnosed with endometrial cancer 11 years ago when she turned 71 s/p lap hyst with BSO. Had cholecystectomy at the same.  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  Hypertension No prior diagnosis of this,  For now gave Lopressor  Hypomagnesemia, replace mag, recheck in the morning  Chronic bilateral lower extremity edema Report not able to tolerate diuretics due to hypotension Currently on exam no significant edema Elevate legs  Normocytic anemia, possibly due to malignancy -Hemoglobin 10.7 -No overt sign of external bleed, right-sided pleural effusion is bloody -Monitor hemoglobin  Thrombocytosis Likely reactive, improving  History of cholecystectomy and loose stools She is on cholestyramine PTA, continue Follows with GI outpatient  Hypothyroidism TSH 1.8 ,continue Synthroid  Osteoporosis -Hold Fosamax for now due to GI symptoms  Failure to thrive with poor oral intake ambulatory dysfunction report walks with a walker PT OT Fall precaution  DVT prophylaxis:  Lovenox    Code Status: DNR Family Communication:  daughter over the  phone Disposition:   Status is: Inpatient  Dispo: The patient is from: Home              Anticipated d/c is to: Home health  Vs SNF              Anticipated d/c date is: To be determined                Consultants:   IR for ultrasound-guided thoracentesis  GI for EGD  oncology  Procedures:   Ultrasound-guided thoracentesis  EGD  Antimicrobials:   None     Objective: Vitals:   12/17/19 1346 12/17/19 2004 12/18/19 0512 12/18/19 0933  BP: (!) 160/82 (!) 166/86 (!) 157/82 (!) 151/83  Pulse: 81 97 80 88  Resp: 15 16 16    Temp: 98.4 F (36.9 C) 99.3 F (37.4 C) 98 F (36.7 C)   TempSrc: Oral Oral Oral   SpO2: 91% 90% 91%   Weight:      Height:        Intake/Output Summary (Last 24 hours) at 12/18/2019 1010 Last data filed at 12/18/2019 0900 Gross per 24 hour  Intake 1260 ml  Output --  Net 1260 ml   Filed Weights   12/14/19 1644 12/16/19 0943  Weight: 60.3 kg 60.5 kg    Examination:  General exam: thin, calm, NAD, + alopecia, very sharp minded lady Respiratory system: tachypnea, no rhonchi this morning, no wheezing, overall diminished Cardiovascular system: S1 & S2 heard, RRR. Trace ankle and pedal edema. Gastrointestinal system: Abdomen is nondistended, soft and nontender. Normal bowel sounds heard. Central nervous system: Alert and oriented. No focal neurological deficits. Extremities: generalized weakness,  Skin: No rashes, lesions or ulcers Psychiatry: Judgement and insight appear normal. Mood & affect appropriate.     Data Reviewed: I have personally reviewed following labs and imaging studies  CBC: Recent Labs  Lab 12/14/19 1707 12/15/19 0417 12/16/19 0553 12/17/19 0527  WBC 7.5 7.1 7.1 6.9  NEUTROABS  --   --   --  5.3  HGB 11.8* 10.6* 10.7* 10.3*  HCT 37.4 33.4* 33.7* 32.1*  MCV 90.1 89.3 89.2 89.2  PLT 543* 458* 454* 450*    Basic Metabolic Panel: Recent Labs  Lab 12/14/19 1707 12/15/19 0417 12/16/19 0553 12/17/19 0527   NA 133* 130* 128* 128*  K 4.0 3.7 3.6 3.6  CL 95* 98 98 98  CO2 24 22 22 22   GLUCOSE 97 103* 89 89  [name] 15 12 11 9   CREATININE 0.72 0.61 0.61 0.56  CALCIUM 9.6 8.4* 8.1* 7.8*  MG  --   --  1.9 1.6*  PHOS  --   --  2.5  --     GFR: Estimated Creatinine Clearance: 44.8 mL/min (by C-G formula based on SCr of 0.56 mg/dL).  Liver Function Tests: Recent Labs  Lab 12/14/19 1910 12/16/19 0553  AST 16 14*  ALT 13 12  ALKPHOS 49 39  BILITOT 0.9 0.6  PROT 7.3 6.2*  ALBUMIN 3.6 2.7*    CBG: Recent Labs  Lab 12/14/19 1651  GLUCAP 83     Recent Results (from the past 240 hour(s))  Respiratory Panel by RT PCR (Flu A&B, Covid) - Nasopharyngeal Swab     Status: None   Collection Time: 12/14/19 10:39 PM   Specimen: Nasopharyngeal Swab  Result Value Ref Range Status   SARS Coronavirus 2 by RT PCR NEGATIVE NEGATIVE Final    Comment: (NOTE) SARS-CoV-2 target nucleic  acids are NOT DETECTED.  The SARS-CoV-2 RNA is generally detectable in upper respiratoy specimens during the acute phase of infection. The lowest concentration of SARS-CoV-2 viral copies this assay can detect is 131 copies/mL. A negative result does not preclude SARS-Cov-2 infection and should not be used as the sole basis for treatment or other patient management decisions. A negative result may occur with  improper specimen collection/handling, submission of specimen other than nasopharyngeal swab, presence of viral mutation(s) within the areas targeted by this assay, and inadequate number of viral copies (<131 copies/mL). A negative result must be combined with clinical observations, patient history, and epidemiological information. The expected result is Negative.  Fact Sheet for Patients:  PinkCheek.be  Fact Sheet for Healthcare Providers:  GravelBags.it  This test is no t yet approved or cleared by the [place] FDA and  has been authorized  for detection and/or diagnosis of SARS-CoV-2 by FDA under an Emergency Use Authorization (EUA). This EUA will remain  in effect (meaning this test can be used) for the duration of the COVID-19 declaration under Section 564(b)(1) of the Act, 21 U.S.C. section 360bbb-3(b)(1), unless the authorization is terminated or revoked sooner.     Influenza A by PCR NEGATIVE NEGATIVE Final   Influenza B by PCR NEGATIVE NEGATIVE Final    Comment: (NOTE) The Xpert Xpress SARS-CoV-2/FLU/RSV assay is intended as an aid in  the diagnosis of influenza from Nasopharyngeal swab specimens and  should not be used as a sole basis for treatment. Nasal washings and  aspirates are unacceptable for Xpert Xpress SARS-CoV-2/FLU/RSV  testing.  Fact Sheet for Patients: PinkCheek.be  Fact Sheet for Healthcare Providers: GravelBags.it  This test is not yet approved or cleared by the [place] FDA and  has been authorized for detection and/or diagnosis of SARS-CoV-2 by  FDA under an Emergency Use Authorization (EUA). This EUA will remain  in effect (meaning this test can be used) for the duration of the  Covid-19 declaration under Section 564(b)(1) of the Act, 21  U.S.C. section 360bbb-3(b)(1), unless the authorization is  terminated or revoked. Performed at [facility], [place] [address], [place], [place] [address]   Body fluid culture     Status: None (Preliminary result)   Collection Time: 12/16/19  9:37 AM   Specimen: PATH Cytology Misc. fluid; Pleural Fluid  Result Value Ref Range Status   Specimen Description   Final    PLEURAL Performed at [facility] [address], [place], [place] [address]    Special Requests   Final    NONE Performed at [facility], [place] [address] [place], [place] [address]    Gram Stain   Final    RARE WBC PRESENT, PREDOMINANTLY MONONUCLEAR NO ORGANISMS  SEEN    Culture   Final    NO GROWTH 2 DAYS Performed at [facility], [place] [address], [place],  [address]    Report Status PENDING  Incomplete         Radiology Studies: CT CHEST ABDOMEN PELVIS W CONTRAST  Result Date: 12/17/2019 CLINICAL DATA:  79 year old female with unintentional weight loss. History of endometrial cancer. EXAM: CT CHEST, ABDOMEN, AND PELVIS WITH CONTRAST TECHNIQUE: Multidetector CT imaging of the chest, abdomen and pelvis was performed following the standard protocol during bolus administration of intravenous contrast. CONTRAST:  130mL OMNIPAQUE IOHEXOL 300 MG/ML  SOLN COMPARISON:  CT abdomen pelvis dated 07/11/2017. FINDINGS: CT CHEST FINDINGS Cardiovascular: There is no cardiomegaly  or pericardial effusion. There is 3 vessel coronary vascular calcification. There is calcification of the mitral annulus. There is moderate atherosclerotic calcification of the thoracic aorta. No aneurysmal dilatation or dissection. The origins of the great vessels of the aortic arch appear patent as visualized. Mediastinum/Nodes: Right hilar mass extending into the subcarinal region measuring 4.1 x 2.3 cm in greatest axial dimension and 6 cm in craniocaudal length. Several subcentimeter but mildly rounded lymph node noted in the prevascular space. The esophagus is grossly unremarkable. Lungs/Pleura: There is a masslike consolidation in the superior segment of the right lower lobe which is contiguous with the right hilar mass. The portion of the mass in the right lower lobe measures 5.2 x 2.0 cm in greatest axial dimensions (49/6). There is high-grade narrowing of the right middle and right lower lobe bronchi secondary to mass effect and compression or infiltration of the mass. There are small clusters of ground-glass opacity in the right upper lobe as well as right lower lobe. A 2.4 x 1.8 cm focal density in the right lower lobe (coronal 76/4) likely extension of tumor versus  pneumonia. There is a moderate right pleural effusion with partial compressive atelectasis of the right lower lobe. There is nodular and irregular thickening of the right upper lobe pleural surface consistent with pleural metastasis. The left lung is clear. There is no pneumothorax. Musculoskeletal: Degenerative changes of the spine. No acute osseous pathology. CT ABDOMEN PELVIS FINDINGS No intra-abdominal free air. Trace free fluid in the pelvis. Hepatobiliary: The liver is unremarkable. No intrahepatic biliary ductal dilatation. Cholecystectomy. Pancreas: Unremarkable. No pancreatic ductal dilatation or surrounding inflammatory changes. Spleen: Normal in size without focal abnormality. Adrenals/Urinary Tract: The adrenal glands unremarkable. There is no hydronephrosis on either side. There is symmetric enhancement and excretion of contrast by both kidneys. There is a 1 cm right renal interpolar cyst. The visualized ureters and urinary bladder appear unremarkable. Stomach/Bowel: There is sigmoid diverticulosis as well as diverticula of the ascending colon. Mild pericolonic stranding adjacent to the ascending colonic diverticula may be related to general mesenteric edema or mild diverticulitis. Clinical correlation is recommended. No abscess or perforation. There is a 3 cm duodenal diverticulum at the head of the pancreas as well as a 2 cm distal duodenal diverticula. There is no bowel obstruction. Vascular/Lymphatic: Advanced aortoiliac atherosclerotic disease. The IVC is unremarkable. No portal venous gas. There is no adenopathy. There is a 5 mm gastrohepatic lymph node. Reproductive: Hysterectomy. No adnexal masses. Other: Mild subcutaneous edema. There is streak artifact caused by left hip hardware limiting evaluation of the pelvic structures. Musculoskeletal: Osteopenia with degenerative changes of the spine and scoliosis. No acute osseous pathology. Left femoral screw. IMPRESSION: 1. Right lower lobe mass  extending into the right hilum and subcarinal region most consistent with malignancy. There is high-grade narrowing of the right middle and right lower lobe bronchi secondary to mass effect and compression or infiltration of the mass. 2. Moderate right pleural effusion with partial compressive atelectasis of the right lower lobe. 3. Right upper lobe pleural thickening, likely metastasis. 4. Colonic diverticulosis. Mild pericolonic stranding adjacent to the ascending colonic diverticula may be related to general mesenteric edema or mild diverticulitis. Clinical correlation is recommended. No abscess or perforation. 5. Aortic Atherosclerosis (ICD10-I70.0). Electronically Signed   By: Anner Crete M.D.   On: 12/17/2019 19:35   US THORACENTESIS ASP PLEURAL SPACE W/IMG GUIDE  Result Date: 12/16/2019 INDICATION: Patient with remote history of endometrial cancer, weakness, dysphagia, anemia, right pleural effusion.  Request received for diagnostic and therapeutic right thoracentesis. EXAM: ULTRASOUND GUIDED DIAGNOSTIC AND THERAPEUTIC RIGHT THORACENTESIS MEDICATIONS: 1% lidocaine to skin and subcutaneous tissue COMPLICATIONS: None immediate. PROCEDURE: An ultrasound guided thoracentesis was thoroughly discussed with the patient and questions answered. The benefits, risks, alternatives and complications were also discussed. The patient understands and wishes to proceed with the procedure. Written consent was obtained. Ultrasound was performed to localize and mark an adequate pocket of fluid in the right chest. The area was then prepped and draped in the normal sterile fashion. 1% Lidocaine was used for local anesthesia. Under ultrasound guidance a 6 Fr Safe-T-Centesis catheter was introduced. Thoracentesis was performed. The catheter was removed and a dressing applied. FINDINGS: A total of approximately 1.7 liters of bloody fluid was removed. Samples were sent to the laboratory as requested by the clinical team.  Primary MD notified of above. IMPRESSION: Successful ultrasound guided diagnostic and therapeutic right thoracentesis yielding 1.7 liters of pleural fluid. Read by: Rowe Robert, PA-C Electronically Signed   By: Sandi Mariscal M.D.   On: 12/16/2019 11:28        Scheduled Meds: . cholestyramine light  4 g Oral QHS  . enoxaparin (LOVENOX) injection  40 mg Subcutaneous Q24H  . levothyroxine  75 mcg Oral QAC breakfast  . loratadine  10 mg Oral Daily  . metoprolol tartrate  12.5 mg Oral BID  . pantoprazole  40 mg Oral BID  . vitamin B-12  1,000 mcg Oral Daily   Continuous Infusions: . magnesium sulfate bolus IVPB       LOS: 3 days   Time spent: 64mins, case discussed with pulmonology and oncology Greater than 50% of this time was spent in counseling, explanation of diagnosis, planning of further management, and coordination of care.  I have personally reviewed and interpreted on  12/18/2019 daily labs, I reviewed all nursing notes, pharmacy notes, consultant notes,  vitals, pertinent old records  I have discussed plan of care as described above with RN , patient and daughter on 12/18/2019  Voice Recognition /Dragon dictation system was used to create this note, attempts have been made to correct errors. Please contact the author with questions and/or clarifications.   Florencia Reasons, MD PhD FACP [facility]  Available via Epic secure chat 7am-7pm for nonurgent issues Please page for urgent issues To page the attending provider between 7A-7P or the covering provider during after hours 7P-7A, please log into the web site [email or web address] and access using universal [place] password for that web site. If you do not have the password, please call the hospital operator.    12/18/2019, 10:10 AM

## 2019-12-18 NOTE — Progress Notes (Signed)
Physical Therapy Treatment Patient Details Name: Katie [name] MRN: [id number] DOB: 14-Oct-1940 Today's Date: 12/18/2019    History of Present Illness Katie [name] is a 79 y.o. female with medical history significant for endometrial cancer  in remission, hypothyroidism, GERD, osteoporosis who presented to [facility] ED du e to gradually worsening generalized weakness, poor oral intake and dysphagia  of 2 weeks duration,  unintentional weight loss.    PT Comments    Patient progressing gradually with acute PT but remains limited by SOB with activity. She was able to ambulate increased distance today with standing rest breaks and min guard for safety. Pt continues to desat to 84% as a low on RA with gait but recovers to 90% with rest breaks. She was educated on benefits of a rollator to provide seated rest with gait and reports she has one at home. Instructed pt in exercises for functional LE strength and circulation to address LE edema. Patient will benefit from follow up [employer] and 24/7 assist from family if available. Acute PT will continue to progress as able.   Follow Up Recommendations  Home health PT;Supervision/Assistance - 24 hour     Equipment Recommendations  None recommended by PT    Recommendations for Other Services       Precautions / Restrictions Precautions Precautions: Fall Precaution Comments: reports several falls, getting very SOB with activity Restrictions Weight Bearing Restrictions: No    Mobility  Bed Mobility Overal bed mobility: Modified Independent             General bed mobility comments: no assist required, HOB elevated and pt taking extra time.  Transfers Overall transfer level: Needs assistance Equipment used: Rolling walker (2 wheeled) Transfers: Sit to/from Stand Sit to Stand: Min guard         General transfer comment: cues for safe hand placement, min guard for safety with power up.  Ambulation/Gait Ambulation/Gait assistance:  Min guard Gait Distance (Feet): 100 Feet (30, 30, 25, 15) Assistive device: Rolling walker (2 wheeled) Gait Pattern/deviations: Step-through pattern;Decreased stride length Gait velocity: decr   General Gait Details: pt ambulated in hallway ~100' with 3 standing rest breaks throughout and pursed lip breathign to catch breath. SpO2 dropped to low of 84% on RA with gait. With rest breaks pt recovered to 90%.   Stairs             Wheelchair Mobility    Modified Rankin (Stroke Patients Only)       Balance Overall balance assessment: Needs assistance Sitting-balance support: Feet supported Sitting balance-Leahy Scale: Good     Standing balance support: During functional activity;Bilateral upper extremity supported Standing balance-Leahy Scale: Poor                              Cognition Arousal/Alertness: Awake/alert Behavior During Therapy: WFL for tasks assessed/performed;Anxious Overall Cognitive Status: Within Functional Limits for tasks assessed                                        Exercises Other Exercises Other Exercises: 5 reps sit<>stand from EOB, pt using bil UE for power up and taking extended rest break between reps due to SOB. SpO2 low of 87% with exercises Other Exercises: ankle pumps performed in bed for LE swelling and pt encouraged to continue throughout the night.  Other Exercises: 5 reps IS  for breathing with cues to breath in like drinking a milkshake. pt reaching 250-400.    General Comments        Pertinent Vitals/Pain Pain Assessment: No/denies pain    Home Living                      Prior Function            PT Goals (current goals can now be found in the care plan section) Acute Rehab PT Goals Patient Stated Goal: to go home, likes to go out to theater shows PT Goal Formulation: With patient Time For Goal Achievement: 12/29/19 Potential to Achieve Goals: Fair Progress towards PT goals:  Progressing toward goals    Frequency    Min 3X/week      PT Plan Current plan remains appropriate    Co-evaluation              AM-PAC PT "6 Clicks" Mobility   Outcome Measure  Help needed turning from your back to your side while in a flat bed without using bedrails?: None Help needed moving from lying on your back to sitting on the side of a flat bed without using bedrails?: A Little Help needed moving to and from a bed to a chair (including a wheelchair)?: A Little Help needed standing up from a chair using your arms (e.g., wheelchair or bedside chair)?: A Little Help needed to walk in hospital room?: A Little Help needed climbing 3-5 steps with a railing? : A Lot 6 Click Score: 18    End of Session Equipment Utilized During Treatment: Gait belt Activity Tolerance: Patient tolerated treatment well Patient left: in bed;with call bell/phone within reach;with bed alarm set Nurse Communication: Mobility status PT Visit Diagnosis: History of falling (Z91.81);Difficulty in walking, not elsewhere classified (R26.2)     Time: 4696-2952 PT Time Calculation (min) (ACUTE ONLY): 28 min  Charges:  $Gait Training: 8-22 mins $Therapeutic Exercise: 8-22 mins                    Verner Mould, DPT Acute Rehabilitation Services  Office [phone] Pager [phone]  12/18/2019 6:43 PM

## 2019-12-18 NOTE — Progress Notes (Signed)
Physical therapy performed walking 02 evaluation with patient. Therapist reported that patient would desat to mid 23s while walking but would recover to mid 90s while standing still without supplemental oxygen.

## 2019-12-19 DIAGNOSIS — R918 Other nonspecific abnormal finding of lung field: Secondary | ICD-10-CM | POA: Diagnosis not present

## 2019-12-19 DIAGNOSIS — J910 Malignant pleural effusion: Secondary | ICD-10-CM | POA: Diagnosis not present

## 2019-12-19 DIAGNOSIS — R1310 Dysphagia, unspecified: Secondary | ICD-10-CM | POA: Diagnosis not present

## 2019-12-19 LAB — BODY FLUID CULTURE: Culture: NO GROWTH

## 2019-12-19 LAB — BASIC METABOLIC PANEL
Anion gap: 10 (ref 5–15)
BUN: 9 mg/dL (ref 8–23)
CO2: 24 mmol/L (ref 22–32)
Calcium: 8.2 mg/dL — ABNORMAL LOW (ref 8.9–10.3)
Chloride: 97 mmol/L — ABNORMAL LOW (ref 98–111)
Creatinine, Ser: 0.74 mg/dL (ref 0.44–1.00)
GFR, Estimated: >60 mL/min (ref >60)
Glucose, Bld: 104 mg/dL — ABNORMAL HIGH (ref 70–99)
Potassium: 3.7 mmol/L (ref 3.5–5.1)
Sodium: 131 mmol/L — ABNORMAL LOW (ref 135–145)

## 2019-12-19 LAB — OSMOLALITY: Osmolality: 273 mOsm/kg — ABNORMAL LOW (ref 275–295)

## 2019-12-19 LAB — MAGNESIUM: Magnesium: 2 mg/dL (ref 1.7–2.4)

## 2019-12-19 LAB — URIC ACID: Uric Acid, Serum: 4.5 mg/dL (ref 2.5–7.1)

## 2019-12-19 MED ORDER — CHOLESTYRAMINE LIGHT 4 G PO PACK
4.0000 g | PACK | Freq: Every day | ORAL | Status: DC
  Administered 2019-12-19 – 2019-12-23 (×5): 4 g via ORAL
  Filled 2019-12-19 (×6): qty 1

## 2019-12-19 NOTE — Consult Note (Signed)
Name: Katie [name] MRN: [id number] DOB: 08-23-1940    ADMISSION DATE:  12/14/2019 CONSULTATION DATE:  12/19/2019  REFERRING MD :  Erlinda Hong , triad MD  CHIEF COMPLAINT:  Lung mass  BRIEF PATIENT DESCRIPTION: 79 year old never smoker with a remote history of endometrial cancer admitted with large right malignant pleural effusion and lung mass  SIGNIFICANT EVENTS [place] RT thora 11/10 >> 1.7 L  , protein 4.41, LDH 635, 79% lymphocytes EGD 11/10 >> normal esophagus, multiple gastric polyps  STUDIES:  CT chest/abd/pelvis 11/11 >> Right lower lobe mass extending into the right hilum and subcarinal region most consistent with malignancy. There is high-grade narrowing of the right middle and right lower lobe bronchi secondary to mass effect and compression or infiltration of the mass. 2. Moderate right pleural effusion with partial compressive atelectasis of the right lower lobe. 3. 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  She reports dysphagia worse to solids than liquids.  Shortness of breath and generalized weakness has been ongoing for a few weeks.  She reports 4 pound weight loss over the past 1 week.  She has a remote history of endometrial cancer in 2010 for which she required surgery, chemotherapy and radiation.  Chemotherapy caused hair loss and neuropathy. She was found to have moderate to large right pleural effusion and underwent ultrasound-guided thoracentesis with removal of 1.7 L of fluid.  This showed malignant cells. CT chest /abdomen and pelvis was performed which showed right lower lobe mass extending into the hilum and subcarinal region with narrowing of the right middle and lower lobe bronchi with pleural effusion and suggestion of pleural metastases She was evaluated by oncology, we are consulted for more tissue   She reports that her  breathing has improved after thoracentesis , some dysphagia persists, she was found to desaturate on walking and placed on oxygen by physical therapy today.   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  has a past surgical history that includes Cholecystectomy; Abdominal hysterectomy; Appendectomy (1958); Femur Surgery (06/2013); Breast excisional biopsy (Right); Total knee arthroplasty (Right, 01/05/2019); Rotator cuff repair (2003); Breast surgery (Left); Cataract extraction (04/29/2019); and Cataract extraction (Right, 09/2019). Prior to Admission medications   Medication Sig Start Date End Date Taking? Authorizing Provider  acetaminophen (TYLENOL) 500 MG tablet Take 1,000 mg by mouth daily. At bedtime.    Yes [provider]  alendronate (FOSAMAX) 70 MG tablet TAKE 1 TABLET BY MOUTH ONCE A WEEK, WITH WATER. DO NOT LIE DOWN FOR AT LEAST 30 MINUTES AND UNTIL AFTER FIRST MEAL OF THE DAY Patient taking differently: Take 70 mg by mouth once a week. Take on Sundays 11/25/19  Yes [name], NP  bimatoprost (LUMIGAN) 0.01 % SOLN Place 1 drop into both eyes at bedtime. Can not take Generic   Yes [provider]  brimonidine (ALPHAGAN P) 0.1 % SOLN Place 1 drop into both eyes 2 (two) times daily. Can not take generic   Yes [provider]  CALCIUM-VITAMIN D PO Take 600 mg by mouth daily.    Yes [provider]  cholecalciferol (VITAMIN D) 25 MCG (1000 UNIT) tablet Take 3,000 Units by mouth daily.    Yes [provider]  cholestyramine (QUESTRAN) 4 g packet Take 4 g by mouth at bedtime.   Yes [provider]  esomeprazole (NEXIUM) 20 MG capsule Take 20 mg by mouth daily.    Yes [provider]  levothyroxine (SYNTHROID) 75 MCG tablet TAKE 1 TABLET(75 MCG) BY MOUTH DAILY Patient taking differently: Take 75 mcg by mouth daily before breakfast.  03/09/19  Yes Libby Maw, MD  loratadine (CLARITIN) 10 MG tablet Take 10 mg by mouth daily.   Yes [provider]  Polyethyl Glycol-Propyl Glycol (SYSTANE ULTRA OP) Apply 1 drop to eye daily as needed. Both Eyes. For dry eyes.    Yes [provider]  vitamin B-12 (CYANOCOBALAMIN) 1000 MCG tablet Take 1,000 mcg by mouth daily.   Yes [provider]   Allergies  Allergen Reactions  . Sulfa Antibiotics Rash  . Gabapentin Other (See Comments)    Depressed   . Lyrica [Pregabalin] Other (See Comments)    unknown  . Paxil [Paroxetine]     unknown    FAMILY HISTORY:  family history includes Arthritis in her mother; Cancer in her paternal grandfather and sister; Hearing loss in her maternal grandfather and maternal grandmother; Heart disease in her father; Osteoporosis in her mother. SOCIAL HISTORY:  reports that she has never smoked. She has never used smokeless tobacco. She reports that she does not drink alcohol and does not use drugs.  REVIEW OF SYSTEMS:   Shortness of breath Dysphagia 4 pounds weight loss in 1 week  Constitutional: Negative for fever, chills,malaise/fatigue and diaphoresis.  HENT: Negative for hearing loss, ear pain, nosebleeds, congestion, sore throat, neck pain, tinnitus and ear discharge.   Eyes: Negative for blurred vision, double vision, photophobia, pain, discharge and redness.  Respiratory: Negative for cough, hemoptysis, sputum production, wheezing and stridor.   Cardiovascular: Negative for chest pain, palpitations, orthopnea, claudication, leg swelling and PND.    Gastrointestinal: Negative for heartburn, nausea, vomiting, abdominal pain, diarrhea, constipation, blood in stool and melena.  Genitourinary: Negative for dysuria, urgency, frequency, hematuria and flank pain.  Musculoskeletal: Negative for myalgias, back pain, joint pain and falls.  Skin: Negative for itching and rash.  Neurological: Negative for dizziness, tingling, tremors, sensory change, speech change, focal weakness, seizures, loss of consciousness, weakness and headaches.  Endo/Heme/Allergies: Negative for environmental allergies and polydipsia. Does not bruise/bleed easily.  SUBJECTIVE:   VITAL SIGNS: Temp:  [97.8 F (36.6 C)-99 F (37.2 C)] 97.8 F (36.6 C) (11/13 1237) Pulse Rate:  [73-97] 73 (11/13 1237) Resp:  [14-18] 14 (11/13 1237) BP: (132-159)/(72-92) 142/88 (11/13 1237) SpO2:  [90 %-98 %] 98 % (11/13 1237)  PHYSICAL EXAMINATION: Gen. Pleasant, well-nourished, in no distress, normal affect ENT - no pallor,icterus,no thrush, hair loss Neck: No JVD, no thyromegaly, no carotid bruits Lungs: no use of accessory muscles, no dullness to percussion, decreased RT without rales or rhonchi  Cardiovascular: Rhythm regular, heart sounds  normal, no murmurs or gallops, no peripheral edema Abdomen: soft and non-tender, no hepatosplenomegaly, BS normal. Musculoskeletal: No deformities, no cyanosis or clubbing Neuro:  alert, non focal   Recent Labs  Lab 12/16/19 0553 12/17/19 0527 12/19/19 0746  NA 128* 128* 131*  K 3.6 3.6 3.7  CL 98 98 97*  CO2 22 22 24   BUN 11 9 9   CREATININE 0.61 0.56 0.74  GLUCOSE 89 89 104*  Recent Labs  Lab 12/15/19 0417 12/16/19 0553 12/17/19 0527  HGB 10.6* 10.7* 10.3*  HCT 33.4* 33.7* 32.1*  WBC 7.1 7.1 6.9  PLT 458* 454* 450*   CT CHEST ABDOMEN PELVIS W CONTRAST  Result Date: 12/17/2019 CLINICAL DATA:  78 year old female with unintentional weight loss. History of endometrial cancer. EXAM: CT CHEST, ABDOMEN, AND PELVIS WITH  CONTRAST TECHNIQUE: Multidetector CT imaging of the chest, abdomen and pelvis was performed following the standard protocol during bolus administration of intravenous contrast. CONTRAST:  143mL OMNIPAQUE IOHEXOL 300 MG/ML  SOLN COMPARISON:  CT abdomen pelvis dated 07/11/2017. FINDINGS: CT CHEST FINDINGS Cardiovascular: There is no cardiomegaly or pericardial effusion. There is 3 vessel coronary vascular calcification. There is calcification of the mitral annulus. There is moderate atherosclerotic calcification of the thoracic aorta. No aneurysmal dilatation or dissection. The origins of the great vessels of the aortic arch appear patent as visualized. Mediastinum/Nodes: Right hilar mass extending into the subcarinal region measuring 4.1 x 2.3 cm in greatest axial dimension and 6 cm in craniocaudal length. Several subcentimeter but mildly rounded lymph node noted in the prevascular space. The esophagus is grossly unremarkable. Lungs/Pleura: There is a masslike consolidation in the superior segment of the right lower lobe which is contiguous with the right hilar mass. The portion of the mass in the right lower lobe measures 5.2 x 2.0 cm in greatest axial dimensions (49/6). There is high-grade narrowing of the right middle and right lower lobe bronchi secondary to mass effect and compression or infiltration of the mass. There are small clusters of ground-glass opacity in the right upper lobe as well as right lower lobe. A 2.4 x 1.8 cm focal density in the right lower lobe (coronal 76/4) likely extension of tumor versus pneumonia. There is a moderate right pleural effusion with partial compressive atelectasis of the right lower lobe. There is nodular and irregular thickening of the right upper lobe pleural surface consistent with pleural metastasis. The left lung is clear. There is no pneumothorax. Musculoskeletal: Degenerative changes of the spine. No acute osseous pathology. CT ABDOMEN PELVIS FINDINGS No  intra-abdominal free air. Trace free fluid in the pelvis. Hepatobiliary: The liver is unremarkable. No intrahepatic biliary ductal dilatation. Cholecystectomy. Pancreas: Unremarkable. No pancreatic ductal dilatation or surrounding inflammatory changes. Spleen: Normal in size without focal abnormality. Adrenals/Urinary Tract: The adrenal glands unremarkable. There is no hydronephrosis on either side. There is symmetric enhancement and excretion of contrast by both kidneys. There is a 1 cm right renal interpolar cyst. The visualized ureters and urinary bladder appear unremarkable. Stomach/Bowel: There is sigmoid diverticulosis as well as diverticula of the ascending colon. Mild pericolonic stranding adjacent to the ascending colonic diverticula may be related to general mesenteric edema or mild diverticulitis. Clinical correlation is recommended. No abscess or perforation. There is a 3 cm duodenal diverticulum at the head of the pancreas as well as a 2 cm distal duodenal diverticula. There is no bowel obstruction. Vascular/Lymphatic: Advanced aortoiliac atherosclerotic disease. The IVC is unremarkable. No portal venous gas. There is no adenopathy. There is a 5 mm gastrohepatic lymph node. Reproductive: Hysterectomy. No adnexal masses. Other: Mild subcutaneous edema. There is streak artifact caused by left hip hardware limiting evaluation of the pelvic structures. Musculoskeletal: Osteopenia with degenerative changes of the spine and scoliosis. No acute osseous pathology. Left femoral screw. IMPRESSION: 1. Right lower lobe mass extending into the right hilum and subcarinal region most consistent with malignancy. There is high-grade narrowing of the right middle and right lower  lobe bronchi secondary to mass effect and compression or infiltration of the mass. 2. Moderate right pleural effusion with partial compressive atelectasis of the right lower lobe. 3. Right upper lobe pleural thickening, likely metastasis. 4.  Colonic diverticulosis. Mild pericolonic stranding adjacent to the ascending colonic diverticula may be related to general mesenteric edema or mild diverticulitis. Clinical correlation is recommended. No abscess or perforation. 5. Aortic Atherosclerosis (ICD10-I70.0). Electronically Signed   By: Anner Crete M.D.   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  Narrowing of the right lower lobe bronchus could be extrinsic compression or could be an endobronchial component on my review of her CT scan.  If more tissue is required, we can certainly proceed with bronchoscopy, would likely plan for EBUS just in case we do not see endobronchial lesions.   However I am not sure that we need any more tissue years since this is likely a GYN malignancy.  We will discuss with Dr. Marin Olp , and if required, my team who would be rounding on Monday can schedule procedure  I discussed this plan of care with the patient and her daughter Katie [name], who is a pediatric nurse practitioner on the phone  Kara Mead MD. FCCP. [facility] See Amion for pager  If no response to pager , please call [phone]  After 7:00 pm call Elink  [phone]    12/19/2019, 2:28 PM

## 2019-12-19 NOTE — Progress Notes (Signed)
Physical Therapy Treatment Patient Details Name: Katie [name] MRN: [id number] DOB: 08/11/1940 Today's Date: 12/19/2019    History of Present Illness Katie [name] is a 79 y.o. female with medical history significant for endometrial cancer  in remission, hypothyroidism, GERD, osteoporosis who presented to [facility] ED du e to gradually worsening generalized weakness, poor oral intake and dysphagia  of 2 weeks duration,  unintentional weight loss.    PT Comments    Pt in bed on RA with daughter at bedside on arrival.  General transfer comment: increased time with caution with turns also assited to toilet transfer.  Limited activity towerance and requires seated rest breaks between each activity. Assisted with amb in hallway.  General Gait Details: assisted with amb in hallway a decreased distance due to dyspnea/fatigue.  Used a Rollator walker as pt required a seated rest break after 20 feet.  RA decreased to 78% with DOE 3/4.  Required 2 lts, purse lip breathing and approx 4 min seated rest break to recover to 90%.  VERY limited activity tolerance.  Assisted with amb another 25 feet and another 20 feet same tech. Assisted back to bed per pt request to rest.   Pt lived home alone and was Indep.  Pt will need ST Rehab at SNF to address mobility and activity decline.   Follow Up Recommendations  SNF     Equipment Recommendations  None recommended by PT    Recommendations for Other Services       Precautions / Restrictions Precautions Precautions: Fall Precaution Comments: monitor sats/HR Restrictions Weight Bearing Restrictions: No    Mobility  Bed Mobility Overal bed mobility: Modified Independent       Supine to sit: Modified independent (Device/Increase time)     General bed mobility comments: no assist required, HOB elevated and pt taking extra time.  Transfers Overall transfer level: Needs assistance Equipment used: 4-wheeled walker Transfers: Stand Pivot  Transfers Sit to Stand: Min guard Stand pivot transfers: Min guard       General transfer comment: increased time with caution with turns also assited to toilet transfer.  Limited activity towerance and requires seated rest breaks between each activity.  Ambulation/Gait Ambulation/Gait assistance: Min guard Gait Distance (Feet): 65 Feet (20', 25' 20') Assistive device: 4-wheeled walker Gait Pattern/deviations: Step-to pattern;Step-through pattern Gait velocity: decr   General Gait Details: assisted with amb in hallway a decreased distance due to dyspnea/fatigue.  Used a Rollator walker as pt required a seated rest break after 20 feet.  RA decreased to 78% with DOE 3/4.  Required 2 lts, purse lip breathing and approx 4 min seated rest break to recover to 90%.  VERY limited activity tolerance.  Assisted with amb another 25 feet and another 20 feet same tech.   Stairs             Wheelchair Mobility    Modified Rankin (Stroke Patients Only)       Balance                                            Cognition Arousal/Alertness: Awake/alert Behavior During Therapy: WFL for tasks assessed/performed;Anxious Overall Cognitive Status: Within Functional Limits for tasks assessed                                 General  Comments: AxO x 3 very pleasant/motivated/educated      Exercises      General Comments        Pertinent Vitals/Pain Pain Assessment: No/denies pain    Home Living                      Prior Function            PT Goals (current goals can now be found in the care plan section) Progress towards PT goals: Progressing toward goals    Frequency    Min 3X/week      PT Plan Current plan remains appropriate    Co-evaluation              AM-PAC PT "6 Clicks" Mobility   Outcome Measure  Help needed turning from your back to your side while in a flat bed without using bedrails?: None Help needed  moving from lying on your back to sitting on the side of a flat bed without using bedrails?: A Little Help needed moving to and from a bed to a chair (including a wheelchair)?: A Little Help needed standing up from a chair using your arms (e.g., wheelchair or bedside chair)?: A Little Help needed to walk in hospital room?: A Little Help needed climbing 3-5 steps with a railing? : A Lot 6 Click Score: 18    End of Session Equipment Utilized During Treatment: Gait belt Activity Tolerance: Patient limited by fatigue;Other (comment) (dyspnea) Patient left: in bed;with call bell/phone within reach;with bed alarm set Nurse Communication: Mobility status (pt required supplemental oxygen during activity) PT Visit Diagnosis: History of falling (Z91.81);Difficulty in walking, not elsewhere classified (R26.2)     Time: 7482-7078 PT Time Calculation (min) (ACUTE ONLY): 32 min  Charges:  $Gait Training: 8-22 mins $Therapeutic Activity: 8-22 mins                     Rica Koyanagi  PTA Acute  Rehabilitation Services Pager      [phone] Office      [phone]

## 2019-12-19 NOTE — Progress Notes (Signed)
PROGRESS NOTE    Katie [name]  MLY:[id number] DOB: 31-Oct-1940 DOA: 12/14/2019 PCP: [name], NP    Chief Complaint  Patient presents with  . Weakness  . Dysphagia    Brief Narrative:  Katie [name] is a 79 y.o. female with medical history significant for endometrial cancer 10 years ago with extensive surgery and radiation in 2011, cancer in remission, hypothyroidism, GERD, osteoporosis who presented to [facility] ED as recommended by her PCP due to gradually worsening generalized weakness, poor oral intake and dysphagia with solids and liquids of 2 weeks duration.  Associated with unintentional weight loss, 4 pounds in the last 2 weeks, intermittent diarrhea, nonproductive cough  Subjective:  She denies pain, continue to have intermittent dry cough, significant DOE returned from bath room to her bed. She reports swallowing is better    Assessment & Plan:   Principal Problem:   Generalized weakness Active Problems:   Acquired hypothyroidism   Chronic diarrhea   Essential hypertension   Dysphagia   Volume depletion   Gastric polyp   Endometrial cancer (HCC)   Pleural effusion, malignant   Goals of care, counseling/discussion  Dysphagia -Status post EGD, source of dysphagia not identified by EGD -Has multiple gastric polyps biopsied, also biopsied esophagus ( normal-appearing esophagus), hold Lovenox for another 24 hours, monitor H&H -diet perGI, continue PPI twice daily -Swallow eval to evaluate oropharyngeal dysphagia, may need outpatient esophageal manometry if no oropharyngeal dysphagia -dysphagia possibly due to metastatic cancer in lung cause compression to esophagus   Hyponatremia, euvolemic to dry -Appear chronic since November 2020 -Urine osmolarity is higher than serum osmolality , 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 - will discuss with patient about free water restriction   Right-sided pleural effusion/acute hypoxic respiratory failure/chronic  cough started April this year -reported cough, DOE for a while -She has tachypnea since presentation,  desaturation to 84% on room air during OT eval -Ultrasound-guided diagnostic and therapeutic right thoracentesis performed yielding 1.7 liters of bloody fluid Gram stain no organisms seen , culture no growth, elevated LDH , total nucleated cells 1400 with 79% of lymphocytes ,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 Had cholecystectomy at the same.  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  Hypertension No prior diagnosis of this,  For now continue Lopressor  Hypomagnesemia, replaced and normalized, monitor  Chronic bilateral lower extremity edema Report not able to tolerate diuretics due to hypotension Currently on exam no significant edema Elevate legs  Normocytic anemia, possibly due to malignancy -Hemoglobin 10.7 -No overt sign of external bleed, right-sided pleural effusion is bloody -Monitor hemoglobin  Thrombocytosis Likely reactive, improving  History of cholecystectomy and loose stools She is on cholestyramine PTA, continue Follows with GI outpatient  Hypothyroidism TSH 1.8 ,continue Synthroid  Osteoporosis -Hold Fosamax for now due to GI symptoms  Failure to thrive with poor oral intake ambulatory dysfunction report walks with a walker PT OT Fall precaution  DVT prophylaxis:  Lovenox    Code Status: DNR Family Communication: daughter at bedside Disposition:   Status is: Inpatient  Dispo: The  patient is from: Home  Anticipated d/c is to: SNF              Anticipated d/c date is: To be determined                Consultants:   IR for ultrasound-guided thoracentesis  GI for EGD  Oncology  pulmonology  Procedures:   Ultrasound-guided thoracentesis  EGD  Antimicrobials:   None     Objective: Vitals:   12/18/19 1450 12/18/19 2054 12/18/19 2054 12/19/19 0643  BP: (!) 152/72 132/89 132/89 (!) 159/92  Pulse: 85 96 97 83  Resp: 16 18 18 18   Temp: 99 F (37.2 C) 98.4 F (36.9 C) 98.4 F (36.9 C) 98.6 F (37 C)  TempSrc: Oral Oral Oral Oral  SpO2: 94% 90% 92% 90%  Weight:      Height:        Intake/Output Summary (Last 24 hours) at 12/19/2019 1133 Last data filed at 12/18/2019 2200 Gross per 24 hour  Intake 240 ml  Output --  Net 240 ml   Filed Weights   12/14/19 1644 12/16/19 0943  Weight: 60.3 kg 60.5 kg    Examination:  General exam: thin, calm, NAD, + alopecia, very sharp minded lady Respiratory system: no tachypnea at rest, no rhonchi this morning, no wheezing, right lower lobe diminished Cardiovascular system: S1 & S2 heard, RRR. Trace ankle and pedal edema. Gastrointestinal system: Abdomen is nondistended, soft and nontender. Normal bowel sounds heard. Central nervous system: Alert and oriented. No focal neurological deficits. Extremities: generalized weakness,  Skin: No rashes, lesions or ulcers Psychiatry: Judgement and insight appear normal. Mood & affect appropriate.     Data Reviewed: I have personally reviewed following labs and imaging studies  CBC: Recent Labs  Lab 12/14/19 1707 12/15/19 0417 12/16/19 0553 12/17/19 0527  WBC 7.5 7.1 7.1 6.9  NEUTROABS  --   --   --  5.3  HGB 11.8* 10.6* 10.7* 10.3*  HCT 37.4 33.4* 33.7* 32.1*  MCV 90.1 89.3 89.2 89.2  PLT 543* 458* 454* 450*    Basic Metabolic Panel: Recent Labs  Lab 12/14/19 1707 12/15/19 0417 12/16/19 0553 12/17/19 0527 12/19/19 0746  NA 133*  130* 128* 128* 131*  K 4.0 3.7 3.6 3.6 3.7  CL 95* 98 98 98 97*  CO2 24 22 22 22 24   GLUCOSE 97 103* 89 89 104*  BUN 15 12 11 9 9   CREATININE 0.72 0.61 0.61 0.56 0.74  CALCIUM 9.6 8.4* 8.1* 7.8* 8.2*  MG  --   --  1.9 1.6* 2.0  PHOS  --   --  2.5  --   --     GFR: Estimated Creatinine Clearance: 44.8 mL/min (by C-G formula based on SCr of 0.74 mg/dL).  Liver Function Tests: Recent Labs  Lab 12/14/19 1910 12/16/19 0553  AST 16 14*  ALT 13 12  ALKPHOS 49 39  BILITOT 0.9 0.6  PROT 7.3 6.2*  ALBUMIN 3.6 2.7*    CBG: Recent Labs  Lab 12/14/19 1651  GLUCAP 83     Recent Results (from the past 240 hour(s))  Respiratory Panel by RT PCR (Flu A&B, Covid) - Nasopharyngeal Swab     Status: None   Collection Time: 12/14/19 10:39 PM   Specimen: Nasopharyngeal Swab  Result Value Ref Range Status   SARS Coronavirus 2 by RT PCR NEGATIVE NEGATIVE Final    Comment: (NOTE) SARS-CoV-2 target nucleic acids are NOT DETECTED.  The SARS-CoV-2 RNA is generally detectable in upper  respiratoy specimens during the acute phase of infection. The lowest concentration of SARS-CoV-2 viral copies this assay can detect is 131 copies/mL. A negative result does not preclude SARS-Cov-2 infection and should not be used as the sole basis for treatment or other patient management decisions. A negative result may occur with  improper specimen collection/handling, submission of specimen other than nasopharyngeal swab, presence of viral mutation(s) within the areas targeted by this assay, and inadequate number of viral copies (<131 copies/mL). A negative result must be combined with clinical observations, patient history, and epidemiological information. The expected result is Negative.  Fact Sheet for Patients:  PinkCheek.be  Fact Sheet for Healthcare Providers:  GravelBags.it  This test is no t yet approved or cleared by the [place]  FDA and  has been authorized for detection and/or diagnosis of SARS-CoV-2 by FDA under an Emergency Use Authorization (EUA). This EUA will remain  in effect (meaning this test can be used) for the duration of the COVID-19 declaration under Section 564(b)(1) of the Act, 21 U.S.C. section 360bbb-3(b)(1), unless the authorization is terminated or revoked sooner.     Influenza A by PCR NEGATIVE NEGATIVE Final   Influenza B by PCR NEGATIVE NEGATIVE Final    Comment: (NOTE) The Xpert Xpress SARS-CoV-2/FLU/RSV assay is intended as an aid in  the diagnosis of influenza from Nasopharyngeal swab specimens and  should not be used as a sole basis for treatment. Nasal washings and  aspirates are unacceptable for Xpert Xpress SARS-CoV-2/FLU/RSV  testing.  Fact Sheet for Patients: PinkCheek.be  Fact Sheet for Healthcare Providers: GravelBags.it  This test is not yet approved or cleared by the [place] FDA and  has been authorized for detection and/or diagnosis of SARS-CoV-2 by  FDA under an Emergency Use Authorization (EUA). This EUA will remain  in effect (meaning this test can be used) for the duration of the  Covid-19 declaration under Section 564(b)(1) of the Act, 21  U.S.C. section 360bbb-3(b)(1), unless the authorization is  terminated or revoked. Performed at [facility], [place] [address], [place], [place] [address]   Body fluid culture     Status: None   Collection Time: 12/16/19  9:37 AM   Specimen: PATH Cytology Misc. fluid; Pleural Fluid  Result Value Ref Range Status   Specimen Description   Final    PLEURAL Performed at [facility] [address] [place], [place] [address]    Special Requests   Final    NONE Performed at [facility], [place] [address] [place], [place] [address]    Gram Stain   Final    RARE WBC PRESENT, PREDOMINANTLY MONONUCLEAR NO  ORGANISMS SEEN    Culture   Final    NO GROWTH Performed at [facility], [place] [address], [place],  [address]    Report Status 12/19/2019 FINAL  Final         Radiology Studies: CT CHEST ABDOMEN PELVIS W CONTRAST  Result Date: 12/17/2019 CLINICAL DATA:  79 year old female with unintentional weight loss. History of endometrial cancer. EXAM: CT CHEST, ABDOMEN, AND PELVIS WITH CONTRAST TECHNIQUE: Multidetector CT imaging of the chest, abdomen and pelvis was performed following the standard protocol during bolus administration of intravenous contrast. CONTRAST:  172mL OMNIPAQUE IOHEXOL 300 MG/ML  SOLN COMPARISON:  CT abdomen pelvis dated 07/11/2017. FINDINGS: CT CHEST FINDINGS Cardiovascular: There is no cardiomegaly or pericardial effusion. There is 3 vessel coronary vascular calcification. There is calcification of the mitral  annulus. There is moderate atherosclerotic calcification of the thoracic aorta. No aneurysmal dilatation or dissection. The origins of the great vessels of the aortic arch appear patent as visualized. Mediastinum/Nodes: Right hilar mass extending into the subcarinal region measuring 4.1 x 2.3 cm in greatest axial dimension and 6 cm in craniocaudal length. Several subcentimeter but mildly rounded lymph node noted in the prevascular space. The esophagus is grossly unremarkable. Lungs/Pleura: There is a masslike consolidation in the superior segment of the right lower lobe which is contiguous with the right hilar mass. The portion of the mass in the right lower lobe measures 5.2 x 2.0 cm in greatest axial dimensions (49/6). There is high-grade narrowing of the right middle and right lower lobe bronchi secondary to mass effect and compression or infiltration of the mass. There are small clusters of ground-glass opacity in the right upper lobe as well as right lower lobe. A 2.4 x 1.8 cm focal density in the right lower lobe (coronal 76/4) likely extension of tumor versus  pneumonia. There is a moderate right pleural effusion with partial compressive atelectasis of the right lower lobe. There is nodular and irregular thickening of the right upper lobe pleural surface consistent with pleural metastasis. The left lung is clear. There is no pneumothorax. Musculoskeletal: Degenerative changes of the spine. No acute osseous pathology. CT ABDOMEN PELVIS FINDINGS No intra-abdominal free air. Trace free fluid in the pelvis. Hepatobiliary: The liver is unremarkable. No intrahepatic biliary ductal dilatation. Cholecystectomy. Pancreas: Unremarkable. No pancreatic ductal dilatation or surrounding inflammatory changes. Spleen: Normal in size without focal abnormality. Adrenals/Urinary Tract: The adrenal glands unremarkable. There is no hydronephrosis on either side. There is symmetric enhancement and excretion of contrast by both kidneys. There is a 1 cm right renal interpolar cyst. The visualized ureters and urinary bladder appear unremarkable. Stomach/Bowel: There is sigmoid diverticulosis as well as diverticula of the ascending colon. Mild pericolonic stranding adjacent to the ascending colonic diverticula may be related to general mesenteric edema or mild diverticulitis. Clinical correlation is recommended. No abscess or perforation. There is a 3 cm duodenal diverticulum at the head of the pancreas as well as a 2 cm distal duodenal diverticula. There is no bowel obstruction. Vascular/Lymphatic: Advanced aortoiliac atherosclerotic disease. The IVC is unremarkable. No portal venous gas. There is no adenopathy. There is a 5 mm gastrohepatic lymph node. Reproductive: Hysterectomy. No adnexal masses. Other: Mild subcutaneous edema. There is streak artifact caused by left hip hardware limiting evaluation of the pelvic structures. Musculoskeletal: Osteopenia with degenerative changes of the spine and scoliosis. No acute osseous pathology. Left femoral screw. IMPRESSION: 1. Right lower lobe mass  extending into the right hilum and subcarinal region most consistent with malignancy. There is high-grade narrowing of the right middle and right lower lobe bronchi secondary to mass effect and compression or infiltration of the mass. 2. Moderate right pleural effusion with partial compressive atelectasis of the right lower lobe. 3. Right upper lobe pleural thickening, likely metastasis. 4. Colonic diverticulosis. Mild pericolonic stranding adjacent to the ascending colonic diverticula may be related to general mesenteric edema or mild diverticulitis. Clinical correlation is recommended. No abscess or perforation. 5. Aortic Atherosclerosis (ICD10-I70.0). Electronically Signed   By: Anner Crete M.D.   On: 12/17/2019 19:35        Scheduled Meds: . cholestyramine light  4 g Oral QHS  . enoxaparin (LOVENOX) injection  40 mg Subcutaneous Q24H  . levothyroxine  75 mcg Oral QAC breakfast  . loratadine  10 mg Oral  Daily  . megestrol  160 mg Oral Daily  . metoprolol tartrate  12.5 mg Oral BID  . pantoprazole  40 mg Oral BID  . vitamin B-12  1,000 mcg Oral Daily   Continuous Infusions:    LOS: 4 days   Time spent: 72mins, case discussed with pulmonology  Greater than 50% of this time was spent in counseling, explanation of diagnosis, planning of further management, and coordination of care.  I have personally reviewed and interpreted on  12/19/2019 daily labs, I reviewed all nursing notes, pharmacy notes, consultant notes,  vitals, pertinent old records  I have discussed plan of care as described above with RN , patient and daughter on 12/19/2019  Voice Recognition /Dragon dictation system was used to create this note, attempts have been made to correct errors. Please contact the author with questions and/or clarifications.   Florencia Reasons, MD PhD FACP [facility]  Available via Epic secure chat 7am-7pm for nonurgent issues Please page for urgent issues To page the attending  provider between 7A-7P or the covering provider during after hours 7P-7A, please log into the web site [email or web address] and access using universal [place] password for that web site. If you do not have the password, please call the hospital operator.    12/19/2019, 11:33 AM

## 2019-12-20 ENCOUNTER — Encounter (HOSPITAL_COMMUNITY): Payer: Self-pay | Admitting: Gastroenterology

## 2019-12-20 DIAGNOSIS — R1310 Dysphagia, unspecified: Secondary | ICD-10-CM | POA: Diagnosis not present

## 2019-12-20 LAB — CBC WITH DIFFERENTIAL/PLATELET
Abs Immature Granulocytes: 0.06 10*3/uL (ref 0.00–0.07)
Basophils Absolute: 0 10*3/uL (ref 0.0–0.1)
Basophils Relative: 1 %
Eosinophils Absolute: 0.3 10*3/uL (ref 0.0–0.5)
Eosinophils Relative: 6 %
HCT: 32.9 % — ABNORMAL LOW (ref 36.0–46.0)
Hemoglobin: 10.4 g/dL — ABNORMAL LOW (ref 12.0–15.0)
Immature Granulocytes: 1 %
Lymphocytes Relative: 17 %
Lymphs Abs: 0.9 10*3/uL (ref 0.7–4.0)
MCH: 28.3 pg (ref 26.0–34.0)
MCHC: 31.6 g/dL (ref 30.0–36.0)
MCV: 89.6 fL (ref 80.0–100.0)
Monocytes Absolute: 0.8 10*3/uL (ref 0.1–1.0)
Monocytes Relative: 14 %
Neutro Abs: 3.4 10*3/uL (ref 1.7–7.7)
Neutrophils Relative %: 61 %
Platelets: 435 10*3/uL — ABNORMAL HIGH (ref 150–400)
RBC: 3.67 MIL/uL — ABNORMAL LOW (ref 3.87–5.11)
RDW: 13.2 % (ref 11.5–15.5)
WBC: 5.6 10*3/uL (ref 4.0–10.5)
nRBC: 0 % (ref 0.0–0.2)

## 2019-12-20 LAB — BASIC METABOLIC PANEL
Anion gap: 8 (ref 5–15)
BUN: 11 mg/dL (ref 8–23)
CO2: 26 mmol/L (ref 22–32)
Calcium: 8.4 mg/dL — ABNORMAL LOW (ref 8.9–10.3)
Chloride: 99 mmol/L (ref 98–111)
Creatinine, Ser: 0.64 mg/dL (ref 0.44–1.00)
GFR, Estimated: >60 mL/min (ref >60)
Glucose, Bld: 99 mg/dL (ref 70–99)
Potassium: 3.6 mmol/L (ref 3.5–5.1)
Sodium: 133 mmol/L — ABNORMAL LOW (ref 135–145)

## 2019-12-20 LAB — MAGNESIUM: Magnesium: 1.9 mg/dL (ref 1.7–2.4)

## 2019-12-20 MED ORDER — POTASSIUM CHLORIDE 20 MEQ PO PACK
40.0000 meq | PACK | Freq: Once | ORAL | Status: AC
  Administered 2019-12-20: 40 meq via ORAL
  Filled 2019-12-20: qty 2

## 2019-12-20 MED ORDER — MAGNESIUM SULFATE IN D5W 1-5 GM/100ML-% IV SOLN
1.0000 g | Freq: Once | INTRAVENOUS | Status: AC
  Administered 2019-12-20: 1 g via INTRAVENOUS
  Filled 2019-12-20: qty 100

## 2019-12-20 NOTE — Progress Notes (Signed)
PROGRESS NOTE    Katie [name]  NWG:[id number] DOB: 07-05-1940 DOA: 12/14/2019 PCP: [name], NP    Chief Complaint  Patient presents with  . Weakness  . Dysphagia    Brief Narrative:  Katie [name] is a 79 y.o. female with medical history significant for endometrial cancer 10 years ago with extensive surgery and radiation in 2011, cancer in remission, hypothyroidism, GERD, osteoporosis who presented to [facility] ED as recommended by her PCP due to gradually worsening generalized weakness, poor oral intake and dysphagia with solids and liquids of 2 weeks duration.  Associated with unintentional weight loss, 4 pounds in the last 2 weeks, intermittent diarrhea, nonproductive cough  Subjective:  Report oxygen supplement helped, she continue have dry cough, DOE, she denies pain    Assessment & Plan:   Principal Problem:   Generalized weakness Active Problems:   Acquired hypothyroidism   Chronic diarrhea   Essential hypertension   Dysphagia   Volume depletion   Gastric polyp   Endometrial cancer (HCC)   Pleural effusion, malignant   Goals of care, counseling/discussion  Dysphagia, presenting symptom -Status post EGD, source of dysphagia not identified by EGD -Has multiple gastric polyps biopsied, also biopsied esophagus ( normal-appearing esophagus), hold Lovenox for another 24 hours, monitor H&H -diet perGI, continue PPI twice daily -Swallow eval to evaluate oropharyngeal dysphagia, may need outpatient esophageal manometry if no oropharyngeal dysphagia -dysphagia possibly due to metastatic cancer in lung cause compression to esophagus -Improved, tolerating regular diet in the last 2 days   Hyponatremia, euvolemic to dry -Appear chronic since November 2020 -Urine osmolarity is higher than serum osmolality , 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 - improved, discussed with daughter regarding free water restriction if her sodium gets worse, she agrees, currently  will hold of due to improvement   Right-sided pleural effusion/acute hypoxic respiratory failure/chronic cough started April this year -reported cough, DOE for a while -She has tachypnea since presentation,  desaturation to 84% on room air during OT eval -Ultrasound-guided diagnostic and therapeutic right thoracentesis performed yielding 1.7 liters of bloody fluid Gram stain no organisms seen , culture no growth, elevated LDH , total nucleated cells 1400 with 79% of lymphocytes ,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 Marin Olp requests pulmonology consult for possible bronchoscopy biopsy, pulmonology will discuss with Dr. Marin Olp regarding necessity, if confirmed necessity plan for bronchoscopy on Tuesday per discussion with Dr. Elsworth Soho over the weekend -need home o2    H/o endometrial cancer 11 years ago when she turned 20 s/p lap hyst with BSO. Had cholecystectomy at the same.  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  Hypertension No prior diagnosis of this,  For now continue Lopressor  Hypomagnesemia, replaced and normalized, monitor  Chronic bilateral lower extremity edema Report not able to tolerate diuretics due to hypotension Currently on exam no significant edema Elevate legs  Normocytic anemia, possibly due to malignancy -Hemoglobin 10.7 -No overt sign of external bleed, right-sided pleural effusion is bloody -Monitor hemoglobin  Thrombocytosis Likely reactive, improving  History of cholecystectomy and loose stools She is on cholestyramine PTA, continue Follows with GI outpatient  Hypothyroidism TSH 1.8 ,continue  Synthroid  Osteoporosis -Hold Fosamax for now due to GI symptoms  Failure to thrive with poor oral intake ambulatory dysfunction report walks with a walker PT OT Fall precaution  DVT prophylaxis:  Lovenox    Code Status: DNR Family Communication: daughter over the phone Disposition:   Status is: Inpatient  Dispo: The patient is from: Home              Anticipated d/c is to: SNF              Anticipated d/c date is: To be determined, Monday or Teusday                Consultants:   IR for ultrasound-guided thoracentesis  GI for EGD  Oncology  pulmonology  Procedures:   Ultrasound-guided thoracentesis  EGD  Antimicrobials:   None     Objective: Vitals:   12/19/19 1237 12/19/19 2102 12/20/19 0634 12/20/19 1519  BP: (!) 142/88 (!) 154/70 (!) 146/83 (!) 151/74  Pulse: 73 83 76 85  Resp: 14 16 14  (!) 21  Temp: 97.8 F (36.6 C) 98.4 F (36.9 C) 98.3 F (36.8 C) 99 F (37.2 C)  TempSrc: Oral Oral Oral Oral  SpO2: 98% 96% 97% 96%  Weight:      Height:        Intake/Output Summary (Last 24 hours) at 12/20/2019 1734 Last data filed at 12/20/2019 1600 Gross per 24 hour  Intake 600 ml  Output --  Net 600 ml   Filed Weights   12/14/19 1644 12/16/19 0943  Weight: 60.3 kg 60.5 kg    Examination:  General exam: thin, calm, NAD, + alopecia, very sharp minded lady Respiratory system: no tachypnea at rest, no rhonchi this morning, no wheezing, right lower lobe diminished Cardiovascular system: S1 & S2 heard, RRR. Trace ankle and pedal edema. Gastrointestinal system: Abdomen is nondistended, soft and nontender. Normal bowel sounds heard. Central nervous system: Alert and oriented. No focal neurological deficits. Extremities: generalized weakness,  Skin: No rashes, lesions or ulcers Psychiatry: Judgement and insight appear normal. Mood & affect appropriate.     Data Reviewed: I have personally reviewed following labs and imaging studies  CBC: Recent  Labs  Lab 12/14/19 1707 12/15/19 0417 12/16/19 0553 12/17/19 0527 12/20/19 0632  WBC 7.5 7.1 7.1 6.9 5.6  NEUTROABS  --   --   --  5.3 3.4  HGB 11.8* 10.6* 10.7* 10.3* 10.4*  HCT 37.4 33.4* 33.7* 32.1* 32.9*  MCV 90.1 89.3 89.2 89.2 89.6  PLT 543* 458* 454* 450* 435*    Basic Metabolic Panel: Recent Labs  Lab 12/15/19 0417 12/16/19 0553 12/17/19 0527 12/19/19 0746 12/20/19 0632  NA 130* 128* 128* 131* 133*  K 3.7 3.6 3.6 3.7 3.6  CL 98 98 98 97* 99  CO2 22 22 22 24 26   GLUCOSE 103* 89 89 104* 99  BUN 12 11 9 9 11   CREATININE 0.61 0.61 0.56 0.74 0.64  CALCIUM 8.4* 8.1* 7.8* 8.2* 8.4*  MG  --  1.9 1.6* 2.0 1.9  PHOS  --  2.5  --   --   --     GFR: Estimated Creatinine Clearance: 44.8 mL/min (by C-G formula based on SCr of 0.64 mg/dL).  Liver Function Tests: Recent Labs  Lab 12/14/19 1910 12/16/19 0553  AST 16 14*  ALT 13 12  ALKPHOS 49 39  BILITOT 0.9 0.6  PROT 7.3 6.2*  ALBUMIN 3.6 2.7*    CBG: Recent Labs  Lab 12/14/19 1651  GLUCAP 83     Recent Results (  from the past 240 hour(s))  Respiratory Panel by RT PCR (Flu A&B, Covid) - Nasopharyngeal Swab     Status: None   Collection Time: 12/14/19 10:39 PM   Specimen: Nasopharyngeal Swab  Result Value Ref Range Status   SARS Coronavirus 2 by RT PCR NEGATIVE NEGATIVE Final    Comment: (NOTE) SARS-CoV-2 target nucleic acids are NOT DETECTED.  The SARS-CoV-2 RNA is generally detectable in upper respiratoy specimens during the acute phase of infection. The lowest concentration of SARS-CoV-2 viral copies this assay can detect is 131 copies/mL. A negative result does not preclude SARS-Cov-2 infection and should not be used as the sole basis for treatment or other patient management decisions. A negative result may occur with  improper specimen collection/handling, submission of specimen other than nasopharyngeal swab, presence of viral mutation(s) within the areas targeted by this assay, and inadequate  number of viral copies (<131 copies/mL). A negative result must be combined with clinical observations, patient history, and epidemiological information. The expected result is Negative.  Fact Sheet for Patients:  PinkCheek.be  Fact Sheet for Healthcare Providers:  GravelBags.it  This test is no t yet approved or cleared by the [place] FDA and  has been authorized for detection and/or diagnosis of SARS-CoV-2 by FDA under an Emergency Use Authorization (EUA). This EUA will remain  in effect (meaning this test can be used) for the duration of the COVID-19 declaration under Section 564(b)(1) of the Act, 21 U.S.C. section 360bbb-3(b)(1), unless the authorization is terminated or revoked sooner.     Influenza A by PCR NEGATIVE NEGATIVE Final   Influenza B by PCR NEGATIVE NEGATIVE Final    Comment: (NOTE) The Xpert Xpress SARS-CoV-2/FLU/RSV assay is intended as an aid in  the diagnosis of influenza from Nasopharyngeal swab specimens and  should not be used as a sole basis for treatment. Nasal washings and  aspirates are unacceptable for Xpert Xpress SARS-CoV-2/FLU/RSV  testing.  Fact Sheet for Patients: PinkCheek.be  Fact Sheet for Healthcare Providers: GravelBags.it  This test is not yet approved or cleared by the [place] FDA and  has been authorized for detection and/or diagnosis of SARS-CoV-2 by  FDA under an Emergency Use Authorization (EUA). This EUA will remain  in effect (meaning this test can be used) for the duration of the  Covid-19 declaration under Section 564(b)(1) of the Act, 21  U.S.C. section 360bbb-3(b)(1), unless the authorization is  terminated or revoked. Performed at [facility], [place] [address] [place], [place] [address]   Body fluid culture     Status: None   Collection Time: 12/16/19  9:37 AM   Specimen: PATH  Cytology Misc. fluid; Pleural Fluid  Result Value Ref Range Status   Specimen Description   Final    PLEURAL Performed at [facility] [address], [place], [place] [address]    Special Requests   Final    NONE Performed at [facility], [place] [address], [place], [place] [address]    Gram Stain   Final    RARE WBC PRESENT, PREDOMINANTLY MONONUCLEAR NO ORGANISMS SEEN    Culture   Final    NO GROWTH Performed at [facility], [place] [address] [place],  [address]    Report Status 12/19/2019 FINAL  Final         Radiology Studies: No results found.      Scheduled Meds: . cholestyramine light  4 g Oral Q2000  . enoxaparin (  LOVENOX) injection  40 mg Subcutaneous Q24H  . levothyroxine  75 mcg Oral QAC breakfast  . loratadine  10 mg Oral Daily  . megestrol  160 mg Oral Daily  . metoprolol tartrate  12.5 mg Oral BID  . pantoprazole  40 mg Oral BID  . vitamin B-12  1,000 mcg Oral Daily   Continuous Infusions:    LOS: 5 days   Time spent: 6mins, case discussed with pulmonology  Greater than 50% of this time was spent in counseling, explanation of diagnosis, planning of further management, and coordination of care.  I have personally reviewed and interpreted on  12/20/2019 daily labs, I reviewed all nursing notes, pharmacy notes, consultant notes,  vitals, pertinent old records  I have discussed plan of care as described above with RN , patient and daughter on 12/20/2019  Voice Recognition /Dragon dictation system was used to create this note, attempts have been made to correct errors. Please contact the author with questions and/or clarifications.   Florencia Reasons, MD PhD FACP [facility]  Available via Epic secure chat 7am-7pm for nonurgent issues Please page for urgent issues To page the attending provider between 7A-7P or the covering provider during after hours 7P-7A, please log into the web site  [email or web address] and access using universal [place] password for that web site. If you do not have the password, please call the hospital operator.    12/20/2019, 5:34 PM

## 2019-12-21 ENCOUNTER — Inpatient Hospital Stay (HOSPITAL_COMMUNITY): Payer: Medicare Other

## 2019-12-21 DIAGNOSIS — C541 Malignant neoplasm of endometrium: Secondary | ICD-10-CM | POA: Diagnosis not present

## 2019-12-21 DIAGNOSIS — R918 Other nonspecific abnormal finding of lung field: Secondary | ICD-10-CM | POA: Diagnosis not present

## 2019-12-21 DIAGNOSIS — R531 Weakness: Secondary | ICD-10-CM | POA: Diagnosis not present

## 2019-12-21 DIAGNOSIS — J90 Pleural effusion, not elsewhere classified: Secondary | ICD-10-CM | POA: Diagnosis not present

## 2019-12-21 HISTORY — PX: IR IMAGING GUIDED PORT INSERTION: IMG5740

## 2019-12-21 LAB — BASIC METABOLIC PANEL
Anion gap: 7 (ref 5–15)
BUN: 11 mg/dL (ref 8–23)
CO2: 26 mmol/L (ref 22–32)
Calcium: 8.4 mg/dL — ABNORMAL LOW (ref 8.9–10.3)
Chloride: 99 mmol/L (ref 98–111)
Creatinine, Ser: 0.62 mg/dL (ref 0.44–1.00)
GFR, Estimated: >60 mL/min (ref >60)
Glucose, Bld: 95 mg/dL (ref 70–99)
Potassium: 4.3 mmol/L (ref 3.5–5.1)
Sodium: 132 mmol/L — ABNORMAL LOW (ref 135–145)

## 2019-12-21 LAB — CA 125: Cancer Antigen (CA) 125: 426 U/mL — ABNORMAL HIGH (ref 0.0–38.1)

## 2019-12-21 LAB — MAGNESIUM: Magnesium: 1.9 mg/dL (ref 1.7–2.4)

## 2019-12-21 MED ORDER — LIDOCAINE-EPINEPHRINE 1 %-1:100000 IJ SOLN
INTRAMUSCULAR | Status: AC
  Filled 2019-12-21: qty 1

## 2019-12-21 MED ORDER — CHLORHEXIDINE GLUCONATE CLOTH 2 % EX PADS
6.0000 | MEDICATED_PAD | Freq: Every day | CUTANEOUS | Status: DC
  Administered 2019-12-21 – 2019-12-24 (×3): 6 via TOPICAL

## 2019-12-21 MED ORDER — CEFAZOLIN SODIUM-DEXTROSE 2-4 GM/100ML-% IV SOLN
INTRAVENOUS | Status: AC
  Administered 2019-12-21: 2 g via INTRAVENOUS
  Filled 2019-12-21: qty 100

## 2019-12-21 MED ORDER — MIDAZOLAM HCL 2 MG/2ML IJ SOLN
INTRAMUSCULAR | Status: AC
  Filled 2019-12-21: qty 2

## 2019-12-21 MED ORDER — ENOXAPARIN SODIUM 40 MG/0.4ML ~~LOC~~ SOLN: 40.0000 mg | Freq: Every day | SUBCUTANEOUS | Status: DC

## 2019-12-21 MED ORDER — MIDAZOLAM HCL 2 MG/2ML IJ SOLN
INTRAMUSCULAR | Status: AC | PRN
  Administered 2019-12-21 (×2): 0.5 mg via INTRAVENOUS
  Administered 2019-12-21: 1 mg via INTRAVENOUS

## 2019-12-21 MED ORDER — SODIUM CHLORIDE 0.9 % IV SOLN
510.0000 mg | Freq: Once | INTRAVENOUS | Status: AC
  Administered 2019-12-21: 510 mg via INTRAVENOUS
  Filled 2019-12-21: qty 510

## 2019-12-21 MED ORDER — LIDOCAINE-EPINEPHRINE 1 %-1:100000 IJ SOLN
INTRAMUSCULAR | Status: AC | PRN
  Administered 2019-12-21: 10 mL

## 2019-12-21 MED ORDER — FENTANYL CITRATE (PF) 100 MCG/2ML IJ SOLN
INTRAMUSCULAR | Status: AC | PRN
Start: 2019-12-21 — End: 2019-12-21
  Administered 2019-12-21 (×2): 50 ug via INTRAVENOUS

## 2019-12-21 MED ORDER — CEFAZOLIN SODIUM-DEXTROSE 2-4 GM/100ML-% IV SOLN: 2.0000 g | Freq: Once | INTRAVENOUS | Status: AC

## 2019-12-21 MED ORDER — FENTANYL CITRATE (PF) 100 MCG/2ML IJ SOLN
INTRAMUSCULAR | Status: AC
  Filled 2019-12-21: qty 2

## 2019-12-21 NOTE — Progress Notes (Signed)
Katie [name] is doing okay.  She is having a little bit more shortness of breath.  We probably need to do another chest x-ray to see if there is fluid building up.  I appreciate Dr. Bari Mantis consultation.  I realize that the that this is a gynecologic malignancy.  The problem is that we need tissue for our molecular studies.  We need to make sure that there is no molecular mutation that we can actually use oral therapy with.  We also need to see if we can utilize immunotherapy.  As such, I think that bronchoscopy is probably going to help [place] out with respect to recommendations for treatment.  Her daughter was with her today.  Her daughter is a Designer, jewellery.  It was nice talking to her.  She actually was in the [employer].  I thanked her for her service.  The CA-125 is normal.  We cannot use this as a marker.  I have her on Megace right now.  I probably will utilize systemic chemotherapy.  I will try to see which she received 11 years ago up in [place].  There is no problems with pain.  Her appetite is doing quite well.  She has had no nausea or vomiting.  There is no bleeding.  She probably will need to have a Port-A-Cath placed.  I know that she is getting outstanding care from all the staff up on [address].  I appreciate all of their help and their compassion.  Lattie Haw, MD  Hebrews 6:10

## 2019-12-21 NOTE — Progress Notes (Signed)
PROGRESS NOTE    [name]  ZOX:[id number] DOB: 1940-11-24 DOA: 12/14/2019 PCP: [name], NP   Chief Complain: Weakness, dysphagia  Brief Narrative:  Patient is 79 year old female with history of endometrial cancer diagnosed about 10 years ago, status post extensive surgery/radiation in 2011, currently cancer remission, hypothyroidism, GERD, osteoporosis who presented to the [facility] emergency department for the evaluation of generalized weakness, poor oral intake, dysphagia.  She also reported unintentional weight loss, intermittent diarrhea, nonproductive cough.  On presentation, she was found to have large right-sided pleural effusion and was hypoxic.  She underwent thoracentesis.  CT chest/abdomen/pelvis showed right lower lobe mass extending into the right hilum.  Oncology recommended bronchoscopy biopsy for tissue specimen.  Plan for bronchoscopy tomorrow.  Assessment & Plan:   Principal Problem:   Generalized weakness Active Problems:   Acquired hypothyroidism   Chronic diarrhea   Essential hypertension   Dysphagia   Volume depletion   Gastric polyp   Endometrial cancer (HCC)   Pleural effusion, malignant   Goals of care, counseling/discussion   Right lung mass with malignant effusion/endometrial cancer: CT imaging on presentation showed right lower lobe mass extending into the right hilum, moderate pleural effusion.  Pulmonology planning  endobronchial ultrasound to obtain additional tissue for molecular studies. Patient has history of intermittent cancer diagnosed about 10 years ago.  She is status post extensive surgery/radiation 2011.  Cancer was in remission until now. Oncology following here. Pleural fluid Gram stain did not show any organisms, culture did not show any growth.  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 IR is planning to put a Port-A-Cath for chemotherapy plan  Acute hypoxic respiratory failure: Most likely secondary  to pleural effusion.  She presented with hypoxia, tachypnea.  Currently needing 2 L oxygen/min for maintenance of saturation.  She underwent ultrasound-guided left-sided thoracentesis with removal of 1.7 L of bloody fluid.   Dysphagia: Patient presented with poor oral intake, difficulty swallowing.  Underwent EGD.  Source of dysphagia not identified.  Currently on PPI.  She may need outpatient esophageal manometry.  Dysphagia suspected to be from metastatic cancer in the lung causing compression to the esophagus.  Currently she is tolerating diet.  Hypertension: Currently blood pressure is stable.On lopressor  Bilateral lower extremity edema: Does not tolerate diuretics due to hypotension.  Currently she does not have significant edema.  Normocytic anemia: Associated with pregnancy.  Currently hemoglobin is still in the range of 10.  Thrombocytosis: Most likely reactive  History of cholecystectomy/loose stools: On Cholestyramine.  Follows with GI as an outpatient  Hypothyroidism: Continue Synthyroid  Osteoporosis: On Fosamax at home  Generalized weakness: PT/OT consulted and recommended home health.          DVT prophylaxis:SCD Code Status: DNR Family Communication: None at bed side Status is: Inpatient  Remains inpatient appropriate because:Inpatient level of care appropriate due to severity of illness   Dispo:  Patient From: Home  Planned Disposition: Home with Health Care Svc  Expected discharge date: 12/23/19  Medically stable for discharge: No      Consultants: PCCM,oncology,iR  Procedures: Thoracentesis  Antimicrobials:  Anti-infectives (From admission, onward)   Start     Dose/Rate Route Frequency Ordered Stop   12/21/19 1115  ceFAZolin (ANCEF) IVPB 2g/100 mL premix        2 g 200 mL/hr over 30 Minutes Intravenous  Once 12/21/19 1027        Subjective:   Objective: Vitals:   12/20/19 0634 12/20/19 1519 12/20/19  2026 12/21/19 0527  BP: (!) 146/83  (!) 151/74 (!) 154/92 (!) 154/83  Pulse: 76 85 80 73  Resp: 14 (!) 21 16 16   Temp: 98.3 F (36.8 C) 99 F (37.2 C) 97.8 F (36.6 C) 98.3 F (36.8 C)  TempSrc: Oral Oral Oral Oral  SpO2: 97% 96% 95% 96%  Weight:      Height:        Intake/Output Summary (Last 24 hours) at 12/21/2019 1331 Last data filed at 12/21/2019 0900 Gross per 24 hour  Intake 340 ml  Output --  Net 340 ml   Filed Weights   12/14/19 1644 12/16/19 0943  Weight: 60.3 kg 60.5 kg    Examination:  General exam: Appears calm and comfortable , very pleasant elderly female HEENT:PERRL,Oral mucosa moist, Ear/Nose normal on gross exam Respiratory system: Diminished breath sounds on the right side, no wheezes or crackles  Cardiovascular system: S1 & S2 heard, RRR. No JVD, murmurs, rubs, gallops or clicks. No pedal edema. Gastrointestinal system: Abdomen is nondistended, soft and nontender. No organomegaly or masses felt. Normal bowel sounds heard. Central nervous system: Alert and oriented. No focal neurological deficits. Extremities: No edema, no clubbing ,no cyanosis Skin: No rashes, lesions or ulcers,no icterus ,no pallor   Data Reviewed: I have personally reviewed following labs and imaging studies  CBC: Recent Labs  Lab 12/14/19 1707 12/15/19 0417 12/16/19 0553 12/17/19 0527 12/20/19 0632  WBC 7.5 7.1 7.1 6.9 5.6  NEUTROABS  --   --   --  5.3 3.4  HGB 11.8* 10.6* 10.7* 10.3* 10.4*  HCT 37.4 33.4* 33.7* 32.1* 32.9*  MCV 90.1 89.3 89.2 89.2 89.6  PLT 543* 458* 454* 450* 235*   Basic Metabolic Panel: Recent Labs  Lab 12/16/19 0553 12/17/19 0527 12/19/19 0746 12/20/19 0632 12/21/19 0516  NA 128* 128* 131* 133* 132*  K 3.6 3.6 3.7 3.6 4.3  CL 98 98 97* 99 99  CO2 22 22 24 26 26   GLUCOSE 89 89 104* 99 95  BUN 11 9 9 11 11   CREATININE 0.61 0.56 0.74 0.64 0.62  CALCIUM 8.1* 7.8* 8.2* 8.4* 8.4*  MG 1.9 1.6* 2.0 1.9 1.9  PHOS 2.5  --   --   --   --    GFR: Estimated Creatinine Clearance:  44.8 mL/min (by C-G formula based on SCr of 0.62 mg/dL). Liver Function Tests: Recent Labs  Lab 12/14/19 1910 12/16/19 0553  AST 16 14*  ALT 13 12  ALKPHOS 49 39  BILITOT 0.9 0.6  PROT 7.3 6.2*  ALBUMIN 3.6 2.7*   Recent Labs  Lab 12/14/19 1910  LIPASE 24   No results for input(s): AMMONIA in the last 168 hours. Coagulation Profile: No results for input(s): INR, PROTIME in the last 168 hours. Cardiac Enzymes: No results for input(s): CKTOTAL, CKMB, CKMBINDEX, TROPONINI in the last 168 hours. BNP (last 3 results) No results for input(s): PROBNP in the last 8760 hours. HbA1C: No results for input(s): HGBA1C in the last 72 hours. CBG: Recent Labs  Lab 12/14/19 1651  GLUCAP 83   Lipid Profile: No results for input(s): CHOL, HDL, LDLCALC, TRIG, CHOLHDL, LDLDIRECT in the last 72 hours. Thyroid Function Tests: No results for input(s): TSH, T4TOTAL, FREET4, T3FREE, THYROIDAB in the last 72 hours. Anemia Panel: No results for input(s): VITAMINB12, FOLATE, FERRITIN, TIBC, IRON, RETICCTPCT in the last 72 hours. Sepsis Labs: No results for input(s): PROCALCITON, LATICACIDVEN in the last 168 hours.  Recent Results (from the past  240 hour(s))  Respiratory Panel by RT PCR (Flu A&B, Covid) - Nasopharyngeal Swab     Status: None   Collection Time: 12/14/19 10:39 PM   Specimen: Nasopharyngeal Swab  Result Value Ref Range Status   SARS Coronavirus 2 by RT PCR NEGATIVE NEGATIVE Final    Comment: (NOTE) SARS-CoV-2 target nucleic acids are NOT DETECTED.  The SARS-CoV-2 RNA is generally detectable in upper respiratoy specimens during the acute phase of infection. The lowest concentration of SARS-CoV-2 viral copies this assay can detect is 131 copies/mL. A negative result does not preclude SARS-Cov-2 infection and should not be used as the sole basis for treatment or other patient management decisions. A negative result may occur with  improper specimen collection/handling,  submission of specimen other than nasopharyngeal swab, presence of viral mutation(s) within the areas targeted by this assay, and inadequate number of viral copies (<131 copies/mL). A negative result must be combined with clinical observations, patient history, and epidemiological information. The expected result is Negative.  Fact Sheet for Patients:  PinkCheek.be  Fact Sheet for Healthcare Providers:  GravelBags.it  This test is no t yet approved or cleared by the [place] FDA and  has been authorized for detection and/or diagnosis of SARS-CoV-2 by FDA under an Emergency Use Authorization (EUA). This EUA will remain  in effect (meaning this test can be used) for the duration of the COVID-19 declaration under Section 564(b)(1) of the Act, 21 U.S.C. section 360bbb-3(b)(1), unless the authorization is terminated or revoked sooner.     Influenza A by PCR NEGATIVE NEGATIVE Final   Influenza B by PCR NEGATIVE NEGATIVE Final    Comment: (NOTE) The Xpert Xpress SARS-CoV-2/FLU/RSV assay is intended as an aid in  the diagnosis of influenza from Nasopharyngeal swab specimens and  should not be used as a sole basis for treatment. Nasal washings and  aspirates are unacceptable for Xpert Xpress SARS-CoV-2/FLU/RSV  testing.  Fact Sheet for Patients: PinkCheek.be  Fact Sheet for Healthcare Providers: GravelBags.it  This test is not yet approved or cleared by the [place] FDA and  has been authorized for detection and/or diagnosis of SARS-CoV-2 by  FDA under an Emergency Use Authorization (EUA). This EUA will remain  in effect (meaning this test can be used) for the duration of the  Covid-19 declaration under Section 564(b)(1) of the Act, 21  U.S.C. section 360bbb-3(b)(1), unless the authorization is  terminated or revoked. Performed at [facility], [place] [address], [place], [place] [address]   Body fluid culture     Status: None   Collection Time: 12/16/19  9:37 AM   Specimen: PATH Cytology Misc. fluid; Pleural Fluid  Result Value Ref Range Status   Specimen Description   Final    PLEURAL Performed at [place] [address], [place], [place] [address]    Special Requests   Final    NONE Performed at [facility], [place] [address] [place], [place] [address]    Gram Stain   Final    RARE WBC PRESENT, PREDOMINANTLY MONONUCLEAR NO ORGANISMS SEEN    Culture   Final    NO GROWTH Performed at [facility], [place] [address], [place], [place] [address]    Report Status 12/19/2019 FINAL  Final         Radiology Studies: DG Chest 2 View  Result Date: 12/21/2019 CLINICAL DATA:  Shortness of breath, history of pleural effusion. EXAM: CHEST - 2 VIEW COMPARISON:  12/16/2019 and CT 12/17/2019 FINDINGS: Examination demonstrates slight interval worsening of a moderate size right pleural effusion likely with associated right basilar atelectasis. Mild patchy density over the right mid to upper lung as seen on recent chest CT probably related to atelectasis or nodular pleural thickening. Cardiomediastinal silhouette and remainder of the exam is unchanged. IMPRESSION: 1. Slight interval worsening of a moderate size right pleural effusion likely with associated right basilar atelectasis. 2. Patchy density over the right mid to upper lung as seen on recent chest CT likely atelectasis or nodular pleural thickening. Right lower lobe mass as seen on CT not well visualized radiographically. Electronically Signed   By: Marin Olp M.D.   On: 12/21/2019 10:02        Scheduled Meds: . cholestyramine light  4 g Oral Q2000  . enoxaparin (LOVENOX) injection  40 mg Subcutaneous Q24H  . levothyroxine  75 mcg Oral QAC breakfast  . loratadine  10 mg Oral Daily  . megestrol  160 mg Oral Daily    . metoprolol tartrate  12.5 mg Oral BID  . pantoprazole  40 mg Oral BID  . vitamin B-12  1,000 mcg Oral Daily   Continuous Infusions: .  ceFAZolin (ANCEF) IV       LOS: 6 days    Time spent: 35 mins.More than 50% of that time was spent in counseling and/or coordination of care.      Shelly Coss, MD [facility] P11/15/2021, 1:31 PM

## 2019-12-21 NOTE — Procedures (Signed)
Pre Procedure Dx: Poor venous access Post Procedural Dx: Same  Successful placement of right IJ approach port-a-cath with tip at the superior caval atrial junction. The catheter is ready for immediate use.  Estimated Blood Loss: Trace  Complications: None immediate.  Jay Brittanyann Wittner, MD Pager #: [phone]   

## 2019-12-21 NOTE — Progress Notes (Signed)
Name: Katie [name] MRN: [id number] DOB: [birth year]    ADMISSION DATE:  12/14/2019 CONSULTATION DATE:  12/21/2019  REFERRING MD :  Erlinda Hong , [facility] MD  CHIEF COMPLAINT:  Lung mass  BRIEF PATIENT DESCRIPTION: 79 year old never smoker with a remote history of endometrial cancer admitted with large right malignant pleural effusion and lung mass  SIGNIFICANT EVENTS [place] RT thora 11/10 >> 1.7 L  , protein 4.41, LDH 635, 79% lymphocytes EGD 11/10 >> normal esophagus, multiple gastric polyps  STUDIES:  CT chest/abd/pelvis 11/11 >> Right lower lobe mass extending into the right hilum and subcarinal region most consistent with malignancy. There is high-grade narrowing of the right middle and right lower lobe bronchi secondary to mass effect and compression or infiltration of the mass. 2. Moderate right pleural effusion with partial compressive atelectasis of the right lower lobe. 3. 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  She reports dysphagia worse to solids than liquids.  Shortness of breath and generalized weakness has been ongoing for a few weeks.  She reports 4 pound weight loss over the past 1 week.  She has a remote history of endometrial cancer in 2010 for which she required surgery, chemotherapy and radiation.  Chemotherapy caused hair loss and neuropathy. She was found to have moderate to large right pleural effusion and underwent ultrasound-guided thoracentesis with removal of 1.7 L of fluid.  This showed malignant cells. CT chest /abdomen and pelvis was performed which showed right lower lobe mass extending into the hilum and subcarinal region with narrowing of the right middle and lower lobe bronchi with pleural effusion and suggestion of pleural metastases She was evaluated by oncology, we are consulted for more tissue   She reports that her  breathing has improved after thoracentesis , some dysphagia persists, she was found to desaturate on walking and placed on oxygen by physical therapy today.   PAST MEDICAL HISTORY :   has a past medical history of Arthritis, Cancer ([place]), Complication of anesthesia, Endometrial cancer ([place]), Endometrial cancer ([place]) (12/18/2019), Family history of adverse reaction to anesthesia, GERD (gastroesophageal reflux disease), Glaucoma, Goals of care, counseling/discussion (12/18/2019), History of fainting (2021), Hypothyroidism, Neuromuscular disorder ([place]), Pleural effusion, malignant (12/18/2019), PONV (postoperative nausea and vomiting), and Thyroid disease.  has a past surgical history that includes Cholecystectomy; Abdominal hysterectomy; Appendectomy (1958); Femur Surgery (06/2013); Breast excisional biopsy (Right); Total knee arthroplasty (Right, 01/05/2019); Rotator cuff repair (2003); Breast surgery (Left); Cataract extraction (04/29/2019); Cataract extraction (Right, 09/2019); Esophagogastroduodenoscopy (egd) with propofol (N/A, 12/16/2019); and biopsy (12/16/2019).   SUBJECTIVE:  Pt reports "I am going to beat this".  Denies pain, shortness of breath.    VITAL SIGNS: Temp:  [97.8 F (36.6 C)-99 F (37.2 C)] 98.3 F (36.8 C) (11/15 0527) Pulse Rate:  [73-85] 73 (11/15 0527) Resp:  [16-21] 16 (11/15 0527) BP: (151-154)/(74-92) 154/83 (11/15 0527) SpO2:  [95 %-96 %] 96 % (11/15 0527)  PHYSICAL EXAMINATION: General: small adult female sitting up in bed in NAD HEENT: MM pink/moist, anicteric, hair loss  Neuro: pleasant, AAOx4, speech clear, MAE  CV: s1s2 RRR, no m/r/g PULM: non-labored on 2L [place], lungs bilaterally clear anterior, diminished R base GI: soft, bsx4 active  Extremities: warm/dry, 1-2+ BLE pitting edema  Skin: no rashes or lesions    Recent Labs  Lab 12/19/19 0746 12/20/19 0632 12/21/19 0516  NA 131* 133* 132*  K 3.7 3.6 4.3  CL 97* 99 99  CO2 24 26 26   BUN 9 11 11     CREATININE 0.74 0.64 0.62  GLUCOSE 104* 99 95   Recent Labs  Lab 12/16/19 0553 12/17/19 0527 12/20/19 0632  HGB 10.7* 10.3* 10.4*  HCT 33.7* 32.1* 32.9*  WBC 7.1 6.9 5.6  PLT 454* 450* 435*   DG Chest 2 View  Result Date: 12/21/2019 CLINICAL DATA:  Shortness of breath, history of pleural effusion. EXAM: CHEST - 2 VIEW COMPARISON:  12/16/2019 and CT 12/17/2019 FINDINGS: Examination demonstrates slight interval worsening of a moderate size right pleural effusion likely with associated right basilar atelectasis. Mild patchy density over the right mid to upper lung as seen on recent chest CT probably related to atelectasis or nodular pleural thickening. Cardiomediastinal silhouette and remainder of the exam is unchanged. IMPRESSION: 1. Slight interval worsening of a moderate size right pleural effusion likely with associated right basilar atelectasis. 2. Patchy density over the right mid to upper lung as seen on recent chest CT likely atelectasis or nodular pleural thickening. Right lower lobe mass as seen on CT not well visualized radiographically. Electronically Signed   By: Marin Olp M.D.   On: 12/21/2019 10:02    ASSESSMENT / PLAN:  Right Lung Mass with Malignant Effusion  Acute Hypoxemic Respiratory Failure  Immunohistochemical profile is most consistent with metastatic adenocarcinoma of GYN primary. Lung mass extends into the hilum and subcarinal region with narrowing of the right lower lobe bronchus which could be extrinsic compression or an endobronchial component.   -NPO after MN  -plan for EBUS (bronchoscopy with endobronchial ultrasound) 11/16 at 10:00 am  -reviewed procedure risks / benefits with patient and she wishes to proceed.  States she can be slow to wake from deep anesthesia.  -pulmonary hygiene - IS, mobilize -wean O2 for sats >90%.  Patient is not O2 dependent at baseline.  -consider ambulatory O2 needs assessment prior to discharge    Daughter - Lavell Luster  updated via phone 11/15     Noe Gens, MSN, NP-C, AGACNP-BC [facility] 12/21/2019, 12:51 PM   Please see [email or web address] for pager details.

## 2019-12-21 NOTE — Consult Note (Signed)
Chief Complaint: Chemotherapy access. Request is for portacath placement  Referring Physician(s): Dr. Pearletha Alfred  Supervising Physician: Sandi Mariscal  Patient Status: [facility] - In-pt  History of Present Illness: Katie [name] is a 79 y.o. female History of hypothyroidism. GERD, Osteoporosis, endometrial cancer s/p SBO with chemotherapy and radiation in 2011. Presented to the ED at [facility] with generalized weakness, dysphagia, weight loss, nonproductive cough and intermittent diarrhea. Found to have a large right lower lobe mass Team is concerned for metastatic disease. Request is for portacath placement for chemotherapy access.  Patient reports a previous right sided portacath placement by a surgery team in [place] approximately 11 years ago.    Past Medical History:  Diagnosis Date  . Arthritis   . Cancer [facility])    Endometrial  . Complication of anesthesia    very hard to wake up from Anesthesia- then when wakes up feels like she is going to faint or have vomiting  . Endometrial cancer [facility])    Per [facility] New Patient Packet   . Endometrial cancer ([place]) 12/18/2019  . Family history of adverse reaction to anesthesia    daughter has a hard time waking up from Anesthesia  . GERD (gastroesophageal reflux disease)   . Glaucoma   . Goals of care, counseling/discussion 12/18/2019  . History of fainting 2021   Per [place] Patient Packet   . Hypothyroidism   . 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 PONV (postoperative nausea and vomiting)   . Thyroid disease     Past Surgical History:  Procedure Laterality Date  . ABDOMINAL HYSTERECTOMY    . APPENDECTOMY  1958  . BIOPSY  12/16/2019   Procedure: BIOPSY;  Surgeon: Thornton Park, MD;  Location: [facility] ENDOSCOPY;  Service: Gastroenterology;;  . BREAST EXCISIONAL BIOPSY Right    benign, was a cyst  . BREAST SURGERY Left     lumpectomy in 1989  . CATARACT EXTRACTION  04/29/2019  . CATARACT EXTRACTION Right 09/2019  . CHOLECYSTECTOMY    . ESOPHAGOGASTRODUODENOSCOPY (EGD) WITH PROPOFOL N/A 12/16/2019   Procedure: ESOPHAGOGASTRODUODENOSCOPY (EGD) WITH PROPOFOL;  Surgeon: Thornton Park, MD;  Location: [facility] ENDOSCOPY;  Service: Gastroenterology;  Laterality: N/A;  likely with dilation  . FEMUR SURGERY  06/2013   Dr.Suarez   . ROTATOR CUFF REPAIR  2003   Per [place] Patient Packet, Dr. Gwenlyn Perking   . TOTAL KNEE ARTHROPLASTY Right 01/05/2019   Procedure: TOTAL KNEE ARTHROPLASTY;  Surgeon: Gaynelle Arabian, MD;  Location: [facility] ORS;  Service: Orthopedics;  Laterality: Right;  82min    Allergies: Sulfa antibiotics, Gabapentin, Lyrica [pregabalin], and Paxil [paroxetine]  Medications: Prior to Admission medications   Medication Sig Start Date End Date Taking? Authorizing Provider  acetaminophen (TYLENOL) 500 MG tablet Take 1,000 mg by mouth daily. At bedtime.    Yes [provider]  alendronate (FOSAMAX) 70 MG tablet TAKE 1 TABLET BY MOUTH ONCE A WEEK, WITH WATER. DO NOT LIE DOWN FOR AT LEAST 30 MINUTES AND UNTIL AFTER FIRST MEAL OF THE DAY Patient taking differently: Take 70 mg by mouth once a week. Take on Sundays 11/25/19  Yes [name], NP  bimatoprost (LUMIGAN) 0.01 % SOLN Place 1 drop into both eyes at bedtime. Can not take Generic   Yes [provider]  brimonidine (ALPHAGAN P) 0.1 % SOLN Place 1 drop into both eyes 2 (two) times daily. Can not  take generic   Yes [provider]  CALCIUM-VITAMIN D PO Take 600 mg by mouth daily.    Yes [provider]  cholecalciferol (VITAMIN D) 25 MCG (1000 UNIT) tablet Take 3,000 Units by mouth daily.    Yes [provider]  cholestyramine (QUESTRAN) 4 g packet Take 4 g by mouth at bedtime.   Yes [provider]  esomeprazole (NEXIUM) 20 MG capsule Take 20 mg by mouth daily.    Yes [provider]  levothyroxine  (SYNTHROID) 75 MCG tablet TAKE 1 TABLET(75 MCG) BY MOUTH DAILY Patient taking differently: Take 75 mcg by mouth daily before breakfast.  03/09/19  Yes Libby Maw, MD  loratadine (CLARITIN) 10 MG tablet Take 10 mg by mouth daily.   Yes [provider]  Polyethyl Glycol-Propyl Glycol (SYSTANE ULTRA OP) Apply 1 drop to eye daily as needed. Both Eyes. For dry eyes.    Yes [provider]  vitamin B-12 (CYANOCOBALAMIN) 1000 MCG tablet Take 1,000 mcg by mouth daily.   Yes [provider]     Family History  Problem Relation Age of Onset  . Arthritis Mother   . Osteoporosis Mother   . Heart disease Father   . Hearing loss Maternal Grandmother   . Hearing loss Maternal Grandfather   . Cancer Paternal Grandfather   . Cancer Sister     Social History   Socioeconomic History  . Marital status: Widowed    Spouse name: Not on file  . Number of children: Not on file  . Years of education: Not on file  . Highest education level: Not on file  Occupational History  . Occupation: retired  Tobacco Use  . Smoking status: Never Smoker  . Smokeless tobacco: Never Used  Vaping Use  . Vaping Use: Never used  Substance and Sexual Activity  . Alcohol use: No  . Drug use: No  . Sexual activity: Not on file  Other Topics Concern  . Not on file  Social History Narrative   Right handed   One story apartment   One cup coffee daily and tea      Per [place] New Patient Packet Abstracted on 07/13/2019 by Chrae Beatty/CMA      Diet: left blank      Caffeine: Yes      Married, if yes what year: Widowed, married in [place] you live in a house, apartment, assisted living, condo, trailer, ect: Apartment, 1 person      Is it one or more stories: Yes, patient lives on ground floor       Pets: No      Current/Past profession: Optometrist      Highest level or education completed: [employer]       Exercise:     Not enough            Type and how often:            Living Will: Yes   DNR: Yes    POA/HPOA: Yes      Functional Status:   Do you have difficulty bathing or dressing yourself? Left blank   Do you have difficulty preparing food or eating? Left blank   Do you have difficulty managing your medications? Left blank   Do you have difficulty managing your finances? Left blank   Do you have difficulty affording your medications? Left blank      Social Determinants of Health  Financial Resource Strain:   . Difficulty of Paying Living Expenses: Not on file  Food Insecurity:   . Worried About Charity fundraiser in the Last Year: Not on file  . Ran Out of Food in the Last Year: Not on file  Transportation Needs:   . Lack of Transportation (Medical): Not on file  . Lack of Transportation (Non-Medical): Not on file  Physical Activity:   . Days of Exercise per Week: Not on file  . Minutes of Exercise per Session: Not on file  Stress:   . Feeling of Stress : Not on file  Social Connections:   . Frequency of Communication with Friends and Family: Not on file  . Frequency of Social Gatherings with Friends and Family: Not on file  . Attends Religious Services: Not on file  . Active Member of Clubs or Organizations: Not on file  . Attends Archivist Meetings: Not on file  . Marital Status: Not on file    Review of Systems: A 12 point ROS discussed and pertinent positives are indicated in the HPI above.  All other systems are negative.  Review of Systems  Constitutional: Negative for fatigue and fever.  HENT: Negative for congestion.   Respiratory: Positive for cough and shortness of breath.   Gastrointestinal: Negative for abdominal pain, diarrhea, nausea and vomiting.    Vital Signs: BP (!) 154/83 (BP Location: Right Arm)   Pulse 73   Temp 98.3 F (36.8 C) (Oral)   Resp 16   Ht 5' (1.524 m)   Wt 133 lb 7 oz (60.5 kg)   SpO2 96%   BMI 26.06 kg/m   Physical Exam Vitals and nursing note reviewed.  Constitutional:       Appearance: She is well-developed.  HENT:     Head: Normocephalic and atraumatic.  Eyes:     Conjunctiva/sclera: Conjunctivae normal.  Cardiovascular:     Rate and Rhythm: Normal rate and regular rhythm.     Heart sounds: Normal heart sounds.  Pulmonary:     Effort: Pulmonary effort is normal.     Breath sounds: Normal breath sounds.     Comments: On supplemental O2 via [place].  Musculoskeletal:        General: Normal range of motion.     Cervical back: Normal range of motion.  Skin:    General: Skin is warm.  Neurological:     Mental Status: She is alert and oriented to person, place, and time.     Imaging: DG Chest 1 View  Result Date: 12/16/2019 CLINICAL DATA:  Status post right thoracentesis. EXAM: CHEST  1 VIEW COMPARISON:  December 15, 2019. FINDINGS: The heart size and mediastinal contours are within normal limits. Right pleural effusion is significantly smaller status post thoracentesis. No pneumothorax is noted. Left lung is clear. The visualized skeletal structures are unremarkable. IMPRESSION: Right pleural effusion is significantly smaller status post thoracentesis. No pneumothorax is noted. Electronically Signed   By: Marijo Conception M.D.   On: 12/16/2019 09:47   CT CHEST ABDOMEN PELVIS W CONTRAST  Result Date: 12/17/2019 CLINICAL DATA:  79 year old female with unintentional weight loss. History of endometrial cancer. EXAM: CT CHEST, ABDOMEN, AND PELVIS WITH CONTRAST TECHNIQUE: Multidetector CT imaging of the chest, abdomen and pelvis was performed following the standard protocol during bolus administration of intravenous contrast. CONTRAST:  152mL OMNIPAQUE IOHEXOL 300 MG/ML  SOLN COMPARISON:  CT abdomen pelvis dated 07/11/2017. FINDINGS: CT CHEST FINDINGS Cardiovascular: There is no  cardiomegaly or pericardial effusion. There is 3 vessel coronary vascular calcification. There is calcification of the mitral annulus. There is moderate atherosclerotic calcification of the  thoracic aorta. No aneurysmal dilatation or dissection. The origins of the great vessels of the aortic arch appear patent as visualized. Mediastinum/Nodes: Right hilar mass extending into the subcarinal region measuring 4.1 x 2.3 cm in greatest axial dimension and 6 cm in craniocaudal length. Several subcentimeter but mildly rounded lymph node noted in the prevascular space. The esophagus is grossly unremarkable. Lungs/Pleura: There is a masslike consolidation in the superior segment of the right lower lobe which is contiguous with the right hilar mass. The portion of the mass in the right lower lobe measures 5.2 x 2.0 cm in greatest axial dimensions (49/6). There is high-grade narrowing of the right middle and right lower lobe bronchi secondary to mass effect and compression or infiltration of the mass. There are small clusters of ground-glass opacity in the right upper lobe as well as right lower lobe. A 2.4 x 1.8 cm focal density in the right lower lobe (coronal 76/4) likely extension of tumor versus pneumonia. There is a moderate right pleural effusion with partial compressive atelectasis of the right lower lobe. There is nodular and irregular thickening of the right upper lobe pleural surface consistent with pleural metastasis. The left lung is clear. There is no pneumothorax. Musculoskeletal: Degenerative changes of the spine. No acute osseous pathology. CT ABDOMEN PELVIS FINDINGS No intra-abdominal free air. Trace free fluid in the pelvis. Hepatobiliary: The liver is unremarkable. No intrahepatic biliary ductal dilatation. Cholecystectomy. Pancreas: Unremarkable. No pancreatic ductal dilatation or surrounding inflammatory changes. Spleen: Normal in size without focal abnormality. Adrenals/Urinary Tract: The adrenal glands unremarkable. There is no hydronephrosis on either side. There is symmetric enhancement and excretion of contrast by both kidneys. There is a 1 cm right renal interpolar cyst. The visualized  ureters and urinary bladder appear unremarkable. Stomach/Bowel: There is sigmoid diverticulosis as well as diverticula of the ascending colon. Mild pericolonic stranding adjacent to the ascending colonic diverticula may be related to general mesenteric edema or mild diverticulitis. Clinical correlation is recommended. No abscess or perforation. There is a 3 cm duodenal diverticulum at the head of the pancreas as well as a 2 cm distal duodenal diverticula. There is no bowel obstruction. Vascular/Lymphatic: Advanced aortoiliac atherosclerotic disease. The IVC is unremarkable. No portal venous gas. There is no adenopathy. There is a 5 mm gastrohepatic lymph node. Reproductive: Hysterectomy. No adnexal masses. Other: Mild subcutaneous edema. There is streak artifact caused by left hip hardware limiting evaluation of the pelvic structures. Musculoskeletal: Osteopenia with degenerative changes of the spine and scoliosis. No acute osseous pathology. Left femoral screw. IMPRESSION: 1. Right lower lobe mass extending into the right hilum and subcarinal region most consistent with malignancy. There is high-grade narrowing of the right middle and right lower lobe bronchi secondary to mass effect and compression or infiltration of the mass. 2. Moderate right pleural effusion with partial compressive atelectasis of the right lower lobe. 3. Right upper lobe pleural thickening, likely metastasis. 4. Colonic diverticulosis. Mild pericolonic stranding adjacent to the ascending colonic diverticula may be related to general mesenteric edema or mild diverticulitis. Clinical correlation is recommended. No abscess or perforation. 5. Aortic Atherosclerosis (ICD10-I70.0). Electronically Signed   By: Anner Crete M.D.   On: 12/17/2019 19:35   DG CHEST PORT 1 VIEW  Result Date: 12/15/2019 CLINICAL DATA:  Inability to eat, possible stricture, lack of intake, history of endometrial cancer  EXAM: PORTABLE CHEST 1 VIEW COMPARISON:   Radiograph 06/27/2019 FINDINGS: New moderate to large right pleural effusion with fluid tracking across the lung apex as well. Adjacent opacity likely reflecting some passive atelectatic changes though underlying airspace disease is difficult to fully exclude. Left lung is predominantly clear. Right cardiac margin is largely obscured by opacity. Visible cardiomediastinal contours are unremarkable with a calcified aorta. The osseous structures appear diffusely demineralized which may limit detection of small or nondisplaced fractures. Degenerative changes are present in the imaged spine and shoulders. High-riding appearance of the humeral heads bilaterally compatible with sequela of rotator cuff insufficiency with evidence of prior right shoulder rotator cuff repair. IMPRESSION: 1. New moderate to large right pleural effusion with fluid tracking across the lung apex. Adjacent opacity likely reflecting some passive atelectatic changes though other underlying processes are difficult to exclude. 2.  Aortic Atherosclerosis (ICD10-I70.0). Electronically Signed   By: Lovena Le M.D.   On: 12/15/2019 00:34   US THORACENTESIS ASP PLEURAL SPACE W/IMG GUIDE  Result Date: 12/16/2019 INDICATION: Patient with remote history of endometrial cancer, weakness, dysphagia, anemia, right pleural effusion. Request received for diagnostic and therapeutic right thoracentesis. EXAM: ULTRASOUND GUIDED DIAGNOSTIC AND THERAPEUTIC RIGHT THORACENTESIS MEDICATIONS: 1% lidocaine to skin and subcutaneous tissue COMPLICATIONS: None immediate. PROCEDURE: An ultrasound guided thoracentesis was thoroughly discussed with the patient and questions answered. The benefits, risks, alternatives and complications were also discussed. The patient understands and wishes to proceed with the procedure. Written consent was obtained. Ultrasound was performed to localize and mark an adequate pocket of fluid in the right chest. The area was then prepped and  draped in the normal sterile fashion. 1% Lidocaine was used for local anesthesia. Under ultrasound guidance a 6 Fr Safe-T-Centesis catheter was introduced. Thoracentesis was performed. The catheter was removed and a dressing applied. FINDINGS: A total of approximately 1.7 liters of bloody fluid was removed. Samples were sent to the laboratory as requested by the clinical team. Primary MD notified of above. IMPRESSION: Successful ultrasound guided diagnostic and therapeutic right thoracentesis yielding 1.7 liters of pleural fluid. Read by: Rowe Robert, PA-C Electronically Signed   By: Sandi Mariscal M.D.   On: 12/16/2019 11:28    Labs:  CBC: Recent Labs    12/15/19 0417 12/16/19 0553 12/17/19 0527 12/20/19 0632  WBC 7.1 7.1 6.9 5.6  HGB 10.6* 10.7* 10.3* 10.4*  HCT 33.4* 33.7* 32.1* 32.9*  PLT 458* 454* 450* 435*    COAGS: Recent Labs    12/29/18 1442  INR 1.0  APTT 30    BMP: Recent Labs    06/27/19 2046 06/27/19 2057 06/28/19 0746 06/28/19 0746 07/27/19 1422 07/27/19 1422 11/19/19 1414 12/14/19 1707 12/17/19 0527 12/19/19 0746 12/20/19 0632 12/21/19 0516  NA 127*   < > 130*   < > 133*   < > 130*   < > 128* 131* 133* 132*  K 4.6   < > 3.5   < > 4.2   < > 4.8   < > 3.6 3.7 3.6 4.3  CL 90*   < > 93*   < > 97*   < > 93*   < > 98 97* 99 99  CO2 26   < > 29   < > 26   < > 27   < > 22 24 26 26   GLUCOSE 116*   < > 98   < > 103   < > 86   < > 89 104* 99 95  BUN 19   < > 13   < > 17   < > 18   < > 9 9 11 11   CALCIUM 9.0   < > 8.8*   < > 10.3   < > 10.0   < > 7.8* 8.2* 8.4* 8.4*  CREATININE 0.86   < > 0.70  --  0.87   < > 0.82   < > 0.56 0.74 0.64 0.62  GFRNONAA >60   < > >60  --  63   < > 67   < > >60 >60 >60 >60  GFRAA >60  --  >60  --  73  --  78  --   --   --   --   --    < > = values in this interval not displayed.    LIVER FUNCTION TESTS: Recent Labs    12/29/18 1442 06/27/19 2046 12/14/19 1910 12/16/19 0553  BILITOT 0.6 0.8 0.9 0.6  AST 25 33 16 14*  ALT 22  10 13 12   ALKPHOS 38 42 49 39  PROT 6.9 6.4* 7.3 6.2*  ALBUMIN 3.8 3.5 3.6 2.7*    Assessment and Plan:  79 y.o. female inpatient. History of hypothyroidism. GERD, Osteoporosis, endometrial cancer s/p SBO with chemotherapy and radiation in 2011. Presented to the ED at [facility] with generalized weakness, dysphagia, weight loss, nonproductive cough and intermittent diarrhea. Found to have a large right lower lobe mass Team is concerned for metastatic disease. Request is for portacath placement for chemotherapy access.   11.11.21 - CT Chest shows no lines or drain. RIJ is accessible. IR performed a thoracentesis on 11.10.21 with 1.7 liters of fluid removed. Allergies include Sulfa. Patient is on subcutaneous prophylactic dose of lovenox currenlty on hold. Patient made NPO.   Risks and benefits of image guided port-a-catheter placement was discussed with the patient including, but not limited to bleeding, infection, pneumothorax, or fibrin sheath development and need for additional procedures.  All of the patient's questions were answered, patient is agreeable to proceed. Consent signed and in chart.  Thank you for this interesting consult.  I greatly enjoyed meeting [name] and look forward to participating in their care.  A copy of this report was sent to the requesting provider on this date.  Electronically Signed: Jacqualine Mau, NP 12/21/2019, 10:29 AM   I spent a total of 40 Minutes    in face to face in clinical consultation, greater than 50% of which was counseling/coordinating care for portacath placement.

## 2019-12-21 NOTE — Progress Notes (Signed)
Physical Therapy Treatment Patient Details Name: Katie [name] MRN: [id number] DOB: 1940/06/26 Today's Date: 12/21/2019    History of Present Illness Katie [name] is a 79 y.o. female with medical history significant for endometrial cancer  in remission, hypothyroidism, GERD, osteoporosis who presented to [facility] ED du e to gradually worsening generalized weakness, poor oral intake and dysphagia  of 2 weeks duration,  unintentional weight loss.    PT Comments     Pt able to ambulate significantly further distance this session, but requires 2L O2 and requires seated or standing rest breaks to recover. Therapist managed O2 tubing to reduce risk for falls when transferring and ambulating. Pt continues to demonstrate DOE requiring seated rest breaks and cues for PLB to recover. At EOS, pt requires min A to lift LLE back into bed. Pt on 2L O2 throughout session with SpO2 96-99% with mobility.   Follow Up Recommendations  SNF     Equipment Recommendations  None recommended by PT    Recommendations for Other Services       Precautions / Restrictions Precautions Precautions: Fall Precaution Comments: monitor sats Restrictions Weight Bearing Restrictions: No    Mobility  Bed Mobility Overal bed mobility: Needs Assistance Bed Mobility: Supine to Sit  Supine to sit: Modified independent (Device/Increase time) Sit to supine: Min assist   General bed mobility comments: min A to lift LLE back into bed, no assist for supine to sit transfer  Transfers Overall transfer level: Needs assistance Equipment used: Straight cane Transfers: Sit to/from Stand Sit to Stand: Supervision    General transfer comment: no physical assist, only tubing management for pt safety  Ambulation/Gait Ambulation/Gait assistance: Supervision Gait Distance (Feet): 280 Feet (80, 60, 140) Assistive device: 4-wheeled walker Gait Pattern/deviations: Step-to pattern;Step-through pattern Gait velocity:  decreased   General Gait Details: pt ambulates in hallway with rollator walker, required seated rest after 93ft and 60 ft, able to ambulate final 140 ft back to room with 1 standing rest break half way through, DOE 2/4 with ambulation on 2L O2   Stairs             Wheelchair Mobility    Modified Rankin (Stroke Patients Only)       Balance Overall balance assessment: Needs assistance Sitting-balance support: Feet supported Sitting balance-Leahy Scale: Good  Standing balance support: During functional activity;Bilateral upper extremity supported Standing balance-Leahy Scale: Poor Standing balance comment: reliant on UE support (SPC or rollator)         Cognition Arousal/Alertness: Awake/alert Behavior During Therapy: WFL for tasks assessed/performed Overall Cognitive Status: Within Functional Limits for tasks assessed             Exercises      General Comments        Pertinent Vitals/Pain Pain Assessment: No/denies pain    Home Living                      Prior Function            PT Goals (current goals can now be found in the care plan section) Acute Rehab PT Goals Patient Stated Goal: to go home, likes to go out to theater shows PT Goal Formulation: With patient Time For Goal Achievement: 12/29/19 Potential to Achieve Goals: Fair Progress towards PT goals: Progressing toward goals    Frequency    Min 3X/week      PT Plan Current plan remains appropriate    Co-evaluation PT/OT/SLP Co-Evaluation/Treatment: Yes Reason for  Co-Treatment: To address functional/ADL transfers PT goals addressed during session: Mobility/safety with mobility;Proper use of DME        AM-PAC PT "6 Clicks" Mobility   Outcome Measure  Help needed turning from your back to your side while in a flat bed without using bedrails?: None Help needed moving from lying on your back to sitting on the side of a flat bed without using bedrails?: None Help needed  moving to and from a bed to a chair (including a wheelchair)?: A Little Help needed standing up from a chair using your arms (e.g., wheelchair or bedside chair)?: A Little Help needed to walk in hospital room?: A Little Help needed climbing 3-5 steps with a railing? : A Lot 6 Click Score: 19    End of Session Equipment Utilized During Treatment: Gait belt Activity Tolerance: Patient tolerated treatment well Patient left: in bed;with call bell/phone within reach;with bed alarm set Nurse Communication: Mobility status PT Visit Diagnosis: History of falling (Z91.81);Difficulty in walking, not elsewhere classified (R26.2)     Time: 8676-1950 PT Time Calculation (min) (ACUTE ONLY): 30 min  Charges:  $Gait Training: 8-22 mins                      Tori Clydia Nieves PT, DPT 12/21/19, 3:50 PM

## 2019-12-21 NOTE — Progress Notes (Signed)
MEDICATION RELATED CONSULT NOTE  Post IR Procedure Consult - Anticoagulant/Antiplatelet PTA/Inpatient Med List Review by Pharmacist  Procedure:  Placement of right IJ approach port-a-cath   Completed: 12/21/19 1720  Post-Procedural bleeding risk per iR MD assessment: standard  Antithrombotic medications on inpatient or PTA profile prior to procedure: Enoxaparin 40 mg subq every 24 hours  Recommended restart time per IR Post-Procedure Guidelines:   Day + 1 (next am)  Plan:  Resume enoxaparin 40 mg subq every 24 hours at 1000 12/22/19 Monitor CBC, s/s bleeding   Efraim Kaufmann, PharmD, BCPS 12/21/2019,7:20 PM

## 2019-12-21 NOTE — Progress Notes (Signed)
Occupational Therapy Treatment Patient Details Name: Katie [name] MRN: [id number] DOB: November 22, 1940 Today's Date: 12/21/2019    History of present illness Katie [name] is a 79 y.o. female with medical history significant for endometrial cancer  in remission, hypothyroidism, GERD, osteoporosis who presented to [facility] ED du e to gradually worsening generalized weakness, poor oral intake and dysphagia  of 2 weeks duration,  unintentional weight loss.   OT comments  This 79 yo female admitted with above presents to acute OT doing much better today with supplemental O2 really helping her a lot with endurance and mobility. She is a spunky lady who is determined to do all she can do get better. She used her SPC in the room and rollator in the hallway. She will continue to benefit from acute OT with follow up at SNF.   Follow Up Recommendations  SNF;Supervision/Assistance - 24 hour    Equipment Recommendations  None recommended by OT       Precautions / Restrictions Precautions Precautions: Fall Precaution Comments: monitor sats (did great today on 2 liters, did not drop below 96%) Restrictions  Weight Bearing Restrictions: No       Mobility Bed Mobility Overal bed mobility: Needs Assistance Bed Mobility: Supine to Sit     Supine to sit: Modified independent (Device/Increase time) Sit to supine: Min assist   General bed mobility comments: min A to lift LLE back into bed, no assist for supine to sit transfer  Transfers Overall transfer level: Needs assistance Equipment used: Straight cane Transfers: Sit to/from Stand Sit to Stand: Supervision         General transfer comment: no physical assist, only tubing management for pt safety    Balance Overall balance assessment: Needs assistance Sitting-balance support: Feet supported Sitting balance-Leahy Scale: Good     Standing balance support: During functional activity;Bilateral upper extremity supported Standing  balance-Leahy Scale: Poor Standing balance comment: reliant on UE support (SPC or rollator)                           ADL either performed or assessed with clinical judgement   ADL Overall ADL's : Needs assistance/impaired     Grooming: Wash/dry hands;Min guard;Standing           Upper Body Dressing : Sitting;Minimal assistance Upper Body Dressing Details (indicate cue type and reason): donning robe Lower Body Dressing: Maximal assistance Lower Body Dressing Details (indicate cue type and reason): for slippers, ,min guard A sit<>stand Toilet Transfer: Min guard;+2 for safety/equipment;Ambulation;Regular Toilet;Grab bars Toilet Transfer Details (indicate cue type and reason): SPC Toileting- Clothing Manipulation and Hygiene: Min guard;Sit to/from stand               Vision Patient Visual Report: No change from baseline            Cognition Arousal/Alertness: Awake/alert Behavior During Therapy: WFL for tasks assessed/performed Overall Cognitive Status: Within Functional Limits for tasks assessed                                                     Pertinent Vitals/ Pain       Pain Assessment: No/denies pain         Frequency  Min 2X/week        Progress Toward Goals  OT Goals(current goals  can now be found in the care plan section)  Progress towards OT goals: Progressing toward goals  Acute Rehab OT Goals Patient Stated Goal: to rehab and then home OT Goal Formulation: With patient Time For Goal Achievement: 12/29/19 Potential to Achieve Goals: Good  Plan Discharge plan needs to be updated    Co-evaluation    PT/OT/SLP Co-Evaluation/Treatment: Yes Reason for Co-Treatment: To address functional/ADL transfers PT goals addressed during session: Mobility/safety with mobility;Proper use of DME OT goals addressed during session: ADL's and self-care      AM-PAC OT "6 Clicks" Daily Activity     Outcome Measure    Help from another person eating meals?: None Help from another person taking care of personal grooming?: A Little Help from another person toileting, which includes using toliet, bedpan, or urinal?: A Little Help from another person bathing (including washing, rinsing, drying)?: A Little Help from another person to put on and taking off regular upper body clothing?: A Little Help from another person to put on and taking off regular lower body clothing?: A Lot 6 Click Score: 18    End of Session Equipment Utilized During Treatment:  (rollator, 2 liters of O2)  OT Visit Diagnosis: Other abnormalities of gait and mobility (R26.89);Muscle weakness (generalized) (M62.81)   Activity Tolerance Patient tolerated treatment well   Patient Left  (sitting EOB with PT finishing up with patient)   Nurse Communication          Time: 2025-4270 OT Time Calculation (min): 26 min  Charges: OT General Charges $OT Visit: 1 Visit OT Treatments $Self Care/Home Management : 8-22 mins  [place], OTR/L Acute [employer] Pager [phone] Office [phone]      Almon Register 12/21/2019, 4:48 PM

## 2019-12-21 NOTE — Care Management Important Message (Signed)
Important Message  Patient Details  IM Letter given to the Patient Name: Katie [name] MRN: [id number] Date of Birth: January 18, 1941   Medicare Important Message Given:  Yes     Kerin Salen 12/21/2019, 10:25 AM

## 2019-12-22 ENCOUNTER — Inpatient Hospital Stay (HOSPITAL_COMMUNITY): Payer: Medicare Other | Admitting: Certified Registered Nurse Anesthetist

## 2019-12-22 ENCOUNTER — Encounter (HOSPITAL_COMMUNITY)
Admission: EM | Disposition: A | Discharge status: Home / Self Care | Payer: Self-pay | Source: Home / Self Care | Attending: Internal Medicine

## 2019-12-22 ENCOUNTER — Inpatient Hospital Stay (HOSPITAL_COMMUNITY): Payer: Medicare Other

## 2019-12-22 ENCOUNTER — Ambulatory Visit: Payer: Medicare Other | Admitting: Gastroenterology

## 2019-12-22 DIAGNOSIS — Z0189 Encounter for other specified special examinations: Secondary | ICD-10-CM | POA: Diagnosis not present

## 2019-12-22 DIAGNOSIS — R531 Weakness: Secondary | ICD-10-CM | POA: Diagnosis not present

## 2019-12-22 HISTORY — PX: BRONCHIAL BRUSHINGS: SHX5108

## 2019-12-22 HISTORY — PX: BRONCHIAL BIOPSY: SHX5109

## 2019-12-22 HISTORY — PX: VIDEO BRONCHOSCOPY: SHX5072

## 2019-12-22 HISTORY — PX: ENDOBRONCHIAL ULTRASOUND: SHX5096

## 2019-12-22 LAB — ECHOCARDIOGRAM COMPLETE
Area-P 1/2: 2.34 cm2
Height: 60 in
MV VTI: 1.37 cm2
S' Lateral: 3 cm
Weight: 2128 oz

## 2019-12-22 SURGERY — ENDOBRONCHIAL ULTRASOUND (EBUS)
Anesthesia: Monitor Anesthesia Care | Laterality: Bilateral

## 2019-12-22 MED ORDER — PHENYLEPHRINE HCL-NACL 10-0.9 MG/250ML-% IV SOLN
INTRAVENOUS | Status: DC | PRN
  Administered 2019-12-22: 50 ug/min via INTRAVENOUS

## 2019-12-22 MED ORDER — PROPOFOL 10 MG/ML IV BOLUS
INTRAVENOUS | Status: DC | PRN
  Administered 2019-12-22: 110 mg via INTRAVENOUS

## 2019-12-22 MED ORDER — PROPOFOL 500 MG/50ML IV EMUL
INTRAVENOUS | Status: DC | PRN
  Administered 2019-12-22: 150 ug/kg/min via INTRAVENOUS

## 2019-12-22 MED ORDER — ROCURONIUM BROMIDE 100 MG/10ML IV SOLN
INTRAVENOUS | Status: DC | PRN
  Administered 2019-12-22: 40 mg via INTRAVENOUS

## 2019-12-22 MED ORDER — ONDANSETRON HCL 4 MG/2ML IJ SOLN
INTRAMUSCULAR | Status: DC | PRN
  Administered 2019-12-22 (×2): 4 mg via INTRAVENOUS

## 2019-12-22 MED ORDER — LACTATED RINGERS IV SOLN
INTRAVENOUS | Status: AC | PRN
  Administered 2019-12-22: 1000 mL via INTRAVENOUS

## 2019-12-22 MED ORDER — SUGAMMADEX SODIUM 200 MG/2ML IV SOLN
INTRAVENOUS | Status: DC | PRN
  Administered 2019-12-22: 150 mg via INTRAVENOUS

## 2019-12-22 MED ORDER — LIDOCAINE HCL 1 % IJ SOLN
INTRAMUSCULAR | Status: AC
  Filled 2019-12-22: qty 1

## 2019-12-22 MED ORDER — LIDOCAINE HCL (CARDIAC) PF 100 MG/5ML IV SOSY
PREFILLED_SYRINGE | INTRAVENOUS | Status: DC | PRN
  Administered 2019-12-22: 100 mg via INTRAVENOUS

## 2019-12-22 MED ORDER — ENOXAPARIN SODIUM 40 MG/0.4ML ~~LOC~~ SOLN
40.0000 mg | Freq: Every day | SUBCUTANEOUS | Status: DC
  Administered 2019-12-24: 40 mg via SUBCUTANEOUS
  Filled 2019-12-22 (×4): qty 0.4

## 2019-12-22 MED ORDER — FENTANYL CITRATE (PF) 100 MCG/2ML IJ SOLN
INTRAMUSCULAR | Status: AC
  Filled 2019-12-22: qty 2

## 2019-12-22 MED ORDER — DEXAMETHASONE SODIUM PHOSPHATE 10 MG/ML IJ SOLN
INTRAMUSCULAR | Status: DC | PRN
  Administered 2019-12-22: 5 mg via INTRAVENOUS

## 2019-12-22 NOTE — Progress Notes (Signed)
Overall, Ms. Oestreicher is doing okay.  She had her bronchoscopy today.  I am so thankful that Dr. Vaughan Browner has done a great job.  I know that we got enough tissue for our molecular studies.  She had a chest x-ray yesterday.  This is shows increase in the pleural effusion on the right.  We will go ahead and see about having this drained again today.  She is on Megace.  This is just to try to help with the cancer right now.  She had a Port-A-Cath placed without any difficulties.  Her appetite is doing quite well.  She has had no nausea or vomiting.  She is out of bed a little bit.  She is quite motivated.  She has had no problem with fever.  Vital signs are pretty stable.  Her blood pressure is up a little bit at 179/78.  Pulse is 78.  Temperature 97.9.  Her lungs sound pretty clear on the left side.  Right side is slightly decreased.  She has some crackles bilaterally.  Cardiac exam regular rate and rhythm with no murmurs, rubs or bruits.  Abdomen is soft.  She has good bowel sounds.  There is no fluid wave.  Hopefully, a thoracentesis will help with respect to the fluid over the right side.  She is worried that the fluid keeps increasing, she will not be able to eat.  Hopefully, she will be able to be discharged home soon.  I think she really wants to go to rehab.  I do not think this would be a bad idea for her so she can get her strength back.  Lattie Haw, MD

## 2019-12-22 NOTE — Progress Notes (Addendum)
PROGRESS NOTE    [name]  YBO:[id number] DOB: 1940-05-02 DOA: 12/14/2019 PCP: [name], NP   Chief Complain: Weakness, dysphagia  Brief Narrative:  Patient is 79 year old female with history of endometrial cancer diagnosed about 10 years ago, status post extensive surgery/radiation in 2011, currently cancer remission, hypothyroidism, GERD, osteoporosis who presented to the [facility] emergency department for the evaluation of generalized weakness, poor oral intake, dysphagia.  She also reported unintentional weight loss, intermittent diarrhea, nonproductive cough.  On presentation, she was found to have large right-sided pleural effusion and was hypoxic.  She underwent thoracentesis.  CT chest/abdomen/pelvis showed right lower lobe mass extending into the right hilum.  Oncology recommended bronchoscopy biopsy for tissue specimen.  Underwent bronc today.  Plan for discharge tomorrow to skilled nursing facility.  Assessment & Plan:   Principal Problem:   Generalized weakness Active Problems:   Acquired hypothyroidism   Chronic diarrhea   Essential hypertension   Dysphagia   Volume depletion   Gastric polyp   Endometrial cancer (HCC)   Pleural effusion, malignant   Goals of care, counseling/discussion   Right lung mass with malignant effusion/endometrial cancer: CT imaging on presentation showed right lower lobe mass extending into the right hilum, moderate pleural effusion.She underwent   endobronchial ultrasound to obtain additional tissue for molecular studies. Patient has endometrial  cancer diagnosed about 10 years ago.  She is status post extensive surgery/radiation 2011.  Cancer was in remission until now. Oncology following here. Pleural fluid Gram stain did not show any organisms, culture did not show any growth.  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 IR  put a Port-A-Cath for chemotherapy plan.  Acute hypoxic respiratory failure: Most  likely secondary to pleural effusion.  She presented with hypoxia, tachypnea.  Currently needing 2 L oxygen/min for maintenance of saturation.  She underwent ultrasound-guided left-sided thoracentesis with removal of 1.7 L of bloody fluid.  Chest x-ray done on 11/16 showed moderate right-sided pleural effusion, plan for another ultrasound-guided thoracentesis today.  Dysphagia: Patient presented with poor oral intake, difficulty swallowing.  Underwent EGD.  Source of dysphagia not identified.  Currently on PPI.  She may need outpatient esophageal manometry.  Dysphagia suspected to be from metastatic cancer in the lung causing compression to the esophagus.  Currently she is tolerating diet.  Hypertension: Currently blood pressure is stable.On lopressor  Bilateral lower extremity edema: Does not tolerate diuretics due to hypotension.  Currently she does not have significant edema.  Normocytic anemia: Associated with pregnancy.  Currently hemoglobin is still in the range of 10.  Thrombocytosis: Most likely reactive  History of cholecystectomy/loose stools: On Cholestyramine.  Follows with GI as an outpatient  Hypothyroidism: Continue Synthyroid  Osteoporosis: On Fosamax at home  Generalized weakness: PT/OT consulted and recommended skilled nursing facility          DVT prophylaxis:SCD Code Status: DNR Family Communication: Called and discussed with daughter on phone Status is: Inpatient  Remains inpatient appropriate because:Inpatient level of care appropriate due to severity of illness   Dispo:  Patient From: Home  Planned Disposition: SNF  Expected discharge date: 12/23/19  Medically stable for discharge: Yes    Consultants: PCCM,oncology,iR  Procedures: Thoracentesis  Antimicrobials:  Anti-infectives (From admission, onward)   Start     Dose/Rate Route Frequency Ordered Stop   12/21/19 1115  ceFAZolin (ANCEF) IVPB 2g/100 mL premix        2 g 200 mL/hr over 30  Minutes Intravenous  Once 12/21/19 1027  12/21/19 1720      Subjective:  Patient seen and examined at the bedside this morning.  Hemodynamically stable.  Comfortable.  Waiting for bronchoscopy today.  Denies any complaints  Objective: Vitals:   12/21/19 1710 12/21/19 1715 12/21/19 2059 12/22/19 0458  BP: (!) 161/77 (!) 165/84 131/76 118/60  Pulse: 85 85 86 77  Resp: 19 (!) _0 Temp:   98.3 F (36.8 C) 98.2 F (36.8 C)  TempSrc:   Oral Oral  SpO2: 98% 98% 93% 95%  Weight:      Height:        Intake/Output Summary (Last 24 hours) at 12/22/2019 0845 Last data filed at 12/21/2019 1650 Gross per 24 hour  Intake 440 ml  Output --  Net 440 ml   Filed Weights   12/14/19 1644 12/16/19 0943  Weight: 60.3 kg 60.5 kg    Examination:  General exam: Appears calm and comfortable ,Not in distress, pleasant elderly female HEENT:PERRL,Oral mucosa moist, Ear/Nose normal on gross exam Respiratory system: Bilateral equal air entry, normal vesicular breath sounds, no wheezes or crackles  Cardiovascular system: S1 & S2 heard, RRR. No JVD, murmurs, rubs, gallops or clicks.  Chemo-Port on the right chest Gastrointestinal system: Abdomen is nondistended, soft and nontender. No organomegaly or masses felt. Normal bowel sounds heard. Central nervous system: Alert and oriented. No focal neurological deficits. Extremities: No edema, no clubbing ,no cyanosis Skin: No rashes, lesions or ulcers,no icterus ,no pallor   Data Reviewed: I have personally reviewed following labs and imaging studies  CBC: Recent Labs  Lab 12/16/19 0553 12/17/19 0527 12/20/19 0632  WBC 7.1 6.9 5.6  NEUTROABS  --  5.3 3.4  HGB 10.7* 10.3* 10.4*  HCT 33.7* 32.1* 32.9*  MCV 89.2 89.2 89.6  PLT 454* 450* 726*   Basic Metabolic Panel: Recent Labs  Lab 12/16/19 0553 12/17/19 0527 12/19/19 0746 12/20/19 0632 12/21/19 0516  NA 128* 128* 131* 133* 132*  K 3.6 3.6 3.7 3.6 4.3  CL 98 98 97* 99 99  CO2 _1 GLUCOSE 89 89 104* 99 95  BUN _2 CREATININE 0.61 0.56 0.74 0.64 0.62  CALCIUM 8.1* 7.8* 8.2* 8.4* 8.4*  MG 1.9 1.6* 2.0 1.9 1.9  PHOS 2.5  --   --   --   --    GFR: Estimated Creatinine Clearance: 44.8 mL/min (by C-G formula based on SCr of 0.62 mg/dL). Liver Function Tests: Recent Labs  Lab 12/16/19 0553  AST 14*  ALT 12  ALKPHOS 39  BILITOT 0.6  PROT 6.2*  ALBUMIN 2.7*   No results for input(s): LIPASE, AMYLASE in the last 168 hours. No results for input(s): AMMONIA in the last 168 hours. Coagulation Profile: No results for input(s): INR, PROTIME in the last 168 hours. Cardiac Enzymes: No results for input(s): CKTOTAL, CKMB, CKMBINDEX, TROPONINI in the last 168 hours. BNP (last 3 results) No results for input(s): PROBNP in the last 8760 hours. HbA1C: No results for input(s): HGBA1C in the last 72 hours. CBG: No results for input(s): GLUCAP in the last 168 hours. Lipid Profile: No results for input(s): CHOL, HDL, LDLCALC, TRIG, CHOLHDL, LDLDIRECT in the last 72 hours. Thyroid Function Tests: No results for input(s): TSH, T4TOTAL, FREET4, T3FREE, THYROIDAB in the last 72 hours. Anemia Panel: No results for input(s): VITAMINB12, FOLATE, FERRITIN, TIBC, IRON, RETICCTPCT in the last 72 hours. Sepsis Labs: No results for input(s): PROCALCITON, LATICACIDVEN in the  last 168 hours.  Recent Results (from the past 240 hour(s))  Respiratory Panel by RT PCR (Flu A&B, Covid) - Nasopharyngeal Swab     Status: None   Collection Time: 12/14/19 10:39 PM   Specimen: Nasopharyngeal Swab  Result Value Ref Range Status   SARS Coronavirus 2 by RT PCR NEGATIVE NEGATIVE Final    Comment: (NOTE) SARS-CoV-2 target nucleic acids are NOT DETECTED.  The SARS-CoV-2 RNA is generally detectable in upper respiratoy specimens during the acute phase of infection. The lowest concentration of SARS-CoV-2 viral copies this assay can detect is 131 copies/mL. A negative result  does not preclude SARS-Cov-2 infection and should not be used as the sole basis for treatment or other patient management decisions. A negative result may occur with  improper specimen collection/handling, submission of specimen other than nasopharyngeal swab, presence of viral mutation(s) within the areas targeted by this assay, and inadequate number of viral copies (<131 copies/mL). A negative result must be combined with clinical observations, patient history, and epidemiological information. The expected result is Negative.  Fact Sheet for Patients:  PinkCheek.be  Fact Sheet for Healthcare Providers:  GravelBags.it  This test is no t yet approved or cleared by the [place] FDA and  has been authorized for detection and/or diagnosis of SARS-CoV-2 by FDA under an Emergency Use Authorization (EUA). This EUA will remain  in effect (meaning this test can be used) for the duration of the COVID-19 declaration under Section 564(b)(1) of the Act, 21 U.S.C. section 360bbb-3(b)(1), unless the authorization is terminated or revoked sooner.     Influenza A by PCR NEGATIVE NEGATIVE Final   Influenza B by PCR NEGATIVE NEGATIVE Final    Comment: (NOTE) The Xpert Xpress SARS-CoV-2/FLU/RSV assay is intended as an aid in  the diagnosis of influenza from Nasopharyngeal swab specimens and  should not be used as a sole basis for treatment. Nasal washings and  aspirates are unacceptable for Xpert Xpress SARS-CoV-2/FLU/RSV  testing.  Fact Sheet for Patients: PinkCheek.be  Fact Sheet for Healthcare Providers: GravelBags.it  This test is not yet approved or cleared by the [place] FDA and  has been authorized for detection and/or diagnosis of SARS-CoV-2 by  FDA under an Emergency Use Authorization (EUA). This EUA will remain  in effect (meaning this test can be used) for  the duration of the  Covid-19 declaration under Section 564(b)(1) of the Act, 21  U.S.C. section 360bbb-3(b)(1), unless the authorization is  terminated or revoked. Performed at [facility], [place] [address] [place], [place] [address]   Body fluid culture     Status: None   Collection Time: 12/16/19  9:37 AM   Specimen: PATH Cytology Misc. fluid; Pleural Fluid  Result Value Ref Range Status   Specimen Description   Final    PLEURAL Performed at [facility] [address] [place], [place] [address]    Special Requests   Final    NONE Performed at [facility], [place] [address] [place], [place] [address]    Gram Stain   Final    RARE WBC PRESENT, PREDOMINANTLY MONONUCLEAR NO ORGANISMS SEEN    Culture   Final    NO GROWTH Performed at [facility], [place] [address] [place],  [address]    Report Status 12/19/2019 FINAL  Final         Radiology Studies: DG Chest 2 View  Result Date: 12/21/2019 CLINICAL DATA:  Shortness of breath, history  of pleural effusion. EXAM: CHEST - 2 VIEW COMPARISON:  12/16/2019 and CT 12/17/2019 FINDINGS: Examination demonstrates slight interval worsening of a moderate size right pleural effusion likely with associated right basilar atelectasis. Mild patchy density over the right mid to upper lung as seen on recent chest CT probably related to atelectasis or nodular pleural thickening. Cardiomediastinal silhouette and remainder of the exam is unchanged. IMPRESSION: 1. Slight interval worsening of a moderate size right pleural effusion likely with associated right basilar atelectasis. 2. Patchy density over the right mid to upper lung as seen on recent chest CT likely atelectasis or nodular pleural thickening. Right lower lobe mass as seen on CT not well visualized radiographically. Electronically Signed   By: Marin Olp M.D.   On: 12/21/2019 10:02   IR IMAGING GUIDED PORT  INSERTION  Result Date: 12/22/2019 INDICATION: History of recurrent metastatic endometrial cancer. In need durable intravenous access for chemotherapy administration. EXAM: IMPLANTED PORT A CATH PLACEMENT WITH ULTRASOUND AND FLUOROSCOPIC GUIDANCE COMPARISON:  CT the chest, abdomen pelvis-12/17/2019 MEDICATIONS: Ancef 2 gm IV; The antibiotic was administered within an appropriate time interval prior to skin puncture. ANESTHESIA/SEDATION: Moderate (conscious) sedation was employed during this procedure. A total of Versed 2 mg and Fentanyl 100 mcg was administered intravenously. Moderate Sedation Time: 22 minutes. The patient's level of consciousness and vital signs were monitored continuously by radiology nursing throughout the procedure under my direct supervision. CONTRAST:  None FLUOROSCOPY TIME:  16 seconds (4 mGy) COMPLICATIONS: None immediate. PROCEDURE: The procedure, risks, benefits, and alternatives were explained to the patient. Questions regarding the procedure were encouraged and answered. The patient understands and consents to the procedure. The right neck and chest were prepped with chlorhexidine in a sterile fashion, and a sterile drape was applied covering the operative field. Maximum barrier sterile technique with sterile gowns and gloves were used for the procedure. A timeout was performed prior to the initiation of the procedure. Local anesthesia was provided with 1% lidocaine with epinephrine. After creating a small venotomy incision, a micropuncture kit was utilized to access the internal jugular vein. Real-time ultrasound guidance was utilized for vascular access including the acquisition of a permanent ultrasound image documenting patency of the accessed vessel. The microwire was utilized to measure appropriate catheter length. A subcutaneous port pocket was then created along the upper chest wall utilizing a combination of sharp and blunt dissection. The pocket was irrigated with sterile  saline. A single lumen "Slim" sized power injectable port was chosen for placement. The 8 Fr catheter was tunneled from the port pocket site to the venotomy incision. The port was placed in the pocket. The external catheter was trimmed to appropriate length. At the venotomy, an 8 Fr peel-away sheath was placed over a guidewire under fluoroscopic guidance. The catheter was then placed through the sheath and the sheath was removed. Final catheter positioning was confirmed and documented with a fluoroscopic spot radiograph. The port was accessed with a Huber needle, aspirated and flushed with heparinized saline. The venotomy site was closed with an interrupted 4-0 Vicryl suture. The port pocket incision was closed with interrupted 2-0 Vicryl suture. The skin was opposed with a running subcuticular 4-0 Vicryl suture. Dermabond and Steri-strips were applied to both incisions. Dressings were applied. The patient tolerated the procedure well without immediate post procedural complication. FINDINGS: After catheter placement, the tip lies within the superior cavoatrial junction. The catheter aspirates and flushes normally and is ready for immediate use. IMPRESSION: Successful placement of a right internal  jugular approach power injectable Port-A-Cath. The catheter is ready for immediate use. Electronically Signed   By: Sandi Mariscal M.D.   On: 12/22/2019 08:29        Scheduled Meds: . Chlorhexidine Gluconate Cloth  6 each Topical Daily  . cholestyramine light  4 g Oral Q2000  . enoxaparin (LOVENOX) injection  40 mg Subcutaneous Daily  . levothyroxine  75 mcg Oral QAC breakfast  . loratadine  10 mg Oral Daily  . megestrol  160 mg Oral Daily  . metoprolol tartrate  12.5 mg Oral BID  . pantoprazole  40 mg Oral BID  . vitamin B-12  1,000 mcg Oral Daily   Continuous Infusions:    LOS: 7 days    Time spent: 35 mins.More than 50% of that time was spent in counseling and/or coordination of  care.      Shelly Coss, MD [facility] P11/16/2021, 8:45 AM

## 2019-12-22 NOTE — NC FL2 (Signed)
[facility] LEVEL OF CARE SCREENING TOOL     IDENTIFICATION  Patient Name: Katie [name] Birthdate: 03-09-1940 Sex: female Admission Date (Current Location): 12/14/2019  [place] Day and [place] Number:  Herbalist and Address:  [facility],  [place] [address] [place]      Provider Number: [id number]  Attending Physician Name and Address:  Shelly Coss, MD  Relative Name and Phone Number:       Current Level of Care: Hospital Recommended Level of Care: [facility] Prior Approval Number:    Date Approved/Denied:   PASRR Number: [id number]  Discharge Plan: SNF    Current Diagnoses: Patient Active Problem List   Diagnosis Date Noted  . Endometrial cancer ([place]) 12/18/2019  . 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 Goals of care, counseling/discussion 12/18/2019  . Gastric polyp   . Dysphagia 12/15/2019  . Volume depletion 12/15/2019  . Generalized weakness 12/14/2019  . PVD (peripheral vascular disease) ([place]) 11/05/2019  . Syncope and collapse 06/27/2019  . Reactive airway disease 06/15/2019  . Muscle cramps 06/15/2019  . Essential hypertension 06/15/2019  . Elevated BP without diagnosis of hypertension 05/29/2019  . Stress and adjustment reaction 05/29/2019  . Iron deficiency 02/23/2019  . B12 deficiency 02/23/2019  . Hospital discharge follow-up 02/20/2019  . Anemia 02/20/2019  . 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 Vitamin D deficiency 09/15/2018  . Pain due to onychomycosis of toenails of both feet 08/05/2018  . Osteoporosis without current pathological fracture 03/17/2018  . Healthcare maintenance 03/17/2018  . Elevated LDL cholesterol level 03/17/2018  . History of total hip arthroplasty, left 01/23/2018  . Cellulitis of right upper extremity 09/26/2017  . Left flank pain 08/26/2017  . OA (osteoarthritis) of knee 06/03/2017  . Loose stools 05/30/2017  . Chronic diarrhea  05/30/2017  . 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 Osteoarthritis of right knee 05/27/2017  . Labyrinthitis 04/18/2017  . Need for assistance due to unsteady gait 04/18/2017  . Age-related osteoporosis without current pathological fracture 01/23/2017  . Acquired hypothyroidism 01/23/2017    Orientation RESPIRATION BLADDER Height & Weight     Self, Time, Situation, Place  O2 (2L 02) Incontinent (intermittent incontinence) Weight: 60.3 kg Height:  5' (152.4 cm)  BEHAVIORAL SYMPTOMS/MOOD NEUROLOGICAL BOWEL NUTRITION STATUS      Continent Diet (Regular)  AMBULATORY STATUS COMMUNICATION OF NEEDS Skin   Limited Assist Verbally Bruising (bilateral arms)                       Personal Care Assistance Level of Assistance  Bathing, Dressing Bathing Assistance: Limited assistance   Dressing Assistance: Limited assistance     Functional Limitations Info  Sight, Hearing Sight Info: Impaired (Wears glasses) Hearing Info: Impaired (Has hearing aids)      SPECIAL CARE FACTORS FREQUENCY  PT (By licensed PT), OT (By licensed OT)     PT Frequency: 5 x weekly OT Frequency: 5 x weekly            Contractures Contractures Info: Not present    Additional Factors Info  Code Status, Allergies Code Status Info: DNR Allergies Info: Sulfa antibiotics, Gabapentin, Lyrica, Paxil           Current Medications (12/22/2019):  This is the current hospital active medication list Current Facility-Administered Medications  Medication Dose Route Frequency Provider Last Rate Last Admin  . acetaminophen (TYLENOL) tablet 650 mg  650 mg Oral Q6H PRN Lang Snow, NP  650 mg at 12/22/19 0205  . Chlorhexidine Gluconate Cloth 2 % PADS 6 each  6 each Topical Daily Shelly Coss, MD   6 each at 12/21/19 2046  . cholestyramine light (PREVALITE) packet 4 g  4 g Oral Q2000 Florencia Reasons, MD   4 g at 12/21/19 2043  . [START ON 12/23/2019] enoxaparin (LOVENOX) injection 40 mg  40 mg  Subcutaneous Daily Adhikari, Amrit, MD      . hydrALAZINE (APRESOLINE) injection 5 mg  5 mg Intravenous Q6H PRN Florencia Reasons, MD   5 mg at 12/16/19 1904  . levothyroxine (SYNTHROID) tablet 75 mcg  75 mcg Oral QAC breakfast Irene Pap N, DO   75 mcg at 12/22/19 1308  . loratadine (CLARITIN) tablet 10 mg  10 mg Oral Daily Irene Pap N, DO   10 mg at 12/22/19 1308  . megestrol (MEGACE) tablet 160 mg  160 mg Oral Daily Volanda Napoleon, MD   160 mg at 12/22/19 1309  . metoprolol tartrate (LOPRESSOR) tablet 12.5 mg  12.5 mg Oral BID Florencia Reasons, MD   12.5 mg at 12/22/19 1308  . ondansetron (ZOFRAN) injection 4 mg  4 mg Intravenous Q6H PRN Pokhrel, Laxman, MD      . pantoprazole (PROTONIX) EC tablet 40 mg  40 mg Oral BID [place], MD   40 mg at 12/22/19 1308  . vitamin B-12 (CYANOCOBALAMIN) tablet 1,000 mcg  1,000 mcg Oral Daily Irene Pap N, DO   1,000 mcg at 12/22/19 1308     Discharge Medications: Please see discharge summary for a list of discharge medications.  Relevant Imaging Results:  Relevant Lab Results:   Additional Information [id number]  Katie [name], Marjie Skiff, RN

## 2019-12-22 NOTE — TOC Progression Note (Signed)
Transition of Care [facility]) - Progression Note    Patient Details  Name: Katie [name] MRN: [id number] Date of Birth: 03-03-1940  [facility] CM/SW Contact  Lee-Ann Gal, Marjie Skiff, RN Phone Number: 12/22/2019, 2:18 PM  Clinical Narrative:    Spoke with pt again about dc planning. Pt now states that she thinks she needs to go to rehab because she can't care for herself at home alone right now. Permission received to fax out FL2 to area SNF facilities. TOC will follow up with pt with SNF bed offers whenever some are received in hub.   Expected Discharge Plan: [place] Barriers to Discharge: Continued Medical Work up  Expected Discharge Plan and Services Expected Discharge Plan: [place]   Discharge Planning Services: CM Consult Post Acute Care Choice: [place] Living arrangements for the past 2 months: Apartment                   Social Determinants of Health (SDOH) Interventions    Readmission Risk Interventions No flowsheet data found.

## 2019-12-22 NOTE — Transfer of Care (Signed)
Immediate Anesthesia Transfer of Care Note  Patient: Katie [name]  Procedure(s) Performed: ENDOBRONCHIAL ULTRASOUND (Bilateral ) BRONCHIAL BRUSHINGS BRONCHIAL BIOPSIES  Patient Location: PACU and Endoscopy Unit  Anesthesia Type:General  Level of Consciousness: awake, alert  and oriented  Airway & Oxygen Therapy: Patient Spontanous Breathing and Patient connected to face mask  Post-op Assessment: Report given to RN and Post -op Vital signs reviewed and stable  Post vital signs: Reviewed, stable  Last Vitals:  Vitals Value Taken Time  BP    Temp    Pulse    Resp    SpO2      Last Pain:  Vitals:   12/22/19 0910  TempSrc: Oral  PainSc: 7       Patients Stated Pain Goal: 7 (15/61/53 7943)  Complications: No complications documented.

## 2019-12-22 NOTE — Op Note (Signed)
[facility] Cardiopulmonary Patient Name: Katie [name] Procedure Date: 12/22/2019 MRN: [id number] Attending MD: Marshell Garfinkel , MD Date of Birth: Mar 28, 1940 CSN: [id number] Age: 79 Admit Type: Inpatient Ethnicity: Not Hispanic or Latino Procedure:             Bronchoscopy Indications:           Right hilar mass Providers:             Marshell Garfinkel, MD, Cleda Daub, RN, Darlene H.                         Rosana Hoes, Technician, Benetta Spar, Technician, Margurite Auerbach, CRNA Referring MD:           Medicines:             General Anesthesia Complications:         No immediate complications Estimated Blood Loss:  Estimated blood loss was minimal. Procedure:      Pre-Anesthesia Assessment:      - A History and Physical has been performed. Patient meds and allergies       have been reviewed. The risks and benefits of the procedure and the       sedation options and risks were discussed with the patient. All       questions were answered and informed consent was obtained. Patient       identification and proposed procedure were verified prior to the       procedure by the physician in the pre-procedure area. Mental Status       Examination: alert and oriented. Airway Examination: normal       oropharyngeal airway. Respiratory Examination: clear to auscultation. CV       Examination: RRR, no murmurs, no S3 or S4. ASA Grade Assessment: II - A       patient with mild systemic disease. After reviewing the risks and       benefits, the patient was deemed in satisfactory condition to undergo       the procedure. The anesthesia plan was to use general anesthesia.       Immediately prior to administration of medications, the patient was       re-assessed for adequacy to receive sedatives. The heart rate,       respiratory rate, oxygen saturations, blood pressure, adequacy of       pulmonary ventilation, and response to care were monitored throughout        the procedure. The physical status of the patient was re-assessed after       the procedure.      After obtaining informed consent, the bronchoscope was passed under       direct vision. Throughout the procedure, the patient's blood pressure,       pulse, and oxygen saturations were monitored continuously. the BF-H190       ([id number]) Olympus Bronchoscope was introduced through the nose, via the       endotracheal tube (the patient was intubated for the procedure) and       advanced to the tracheobronchial tree of both lungs. the BF-[id number]       ([id number]) Olympus EBUS was introduced through the and advanced to the. Findings:      Right Lung Abnormalities: Right upper lobe take off was from main  trachea just above the level of the main carina. Endobronchial narrowing       and endobroncial lesions noted on entrance to right lower lobe and right       middle lobe      An endobronchial ultrasound endoscope was utilized in order to assist       with guiding the biopsy needle and assist with fine needle aspiration in       the subcarinal area and in the right hilum.      Brushings of a lesion were obtained in the right lower lobe with a       cytology brush and sent for routine cytology. Four samples were obtained.      Endobronchial biopsies of a lesion were performed in the right lower       lobe using a forceps and sent for histopathology examination. Five       samples were obtained. Impression:      - Right hilar mass, Right lower lobe endobronchial mass      - Endobronchial ultrasound was performed. Moderate Sedation:      Moderate (conscious) sedation was personally administered by an       anesthesia professional. The following parameters were monitored: oxygen       saturation, heart rate, blood pressure, respiratory rate, EKG, adequacy       of pulmonary ventilation, and response to care. Total physician       intraservice time was 18 minutes. Recommendation:      -  Await biopsy, brushing and cytology results. Procedure Code(s):      --- Professional ---      [id number], Bronchoscopy, rigid or flexible, including fluoroscopic guidance,       when performed; with bronchial or endobronchial biopsy(s), single or       multiple sites      31623, Bronchoscopy, rigid or flexible, including fluoroscopic guidance,       when performed; with brushing or protected brushings      31654, Bronchoscopy, rigid or flexible, including fluoroscopic guidance,       when performed; with transendoscopic endobronchial ultrasound (EBUS)       during bronchoscopic diagnostic or therapeutic intervention(s) for       peripheral lesion(s) (List separately in addition to code for primary       procedure[s]) Diagnosis Code(s):      --- Professional ---      R91.8, Other nonspecific abnormal finding of lung field CPT copyright 2019 American Medical Association. All rights reserved. The codes documented in this report are preliminary and upon coder review may  be revised to meet current compliance requirements. Marshell Garfinkel, MD 12/22/2019 11:18:15 AM Number of Addenda: 0 Scope In: Scope Out:

## 2019-12-22 NOTE — Anesthesia Preprocedure Evaluation (Addendum)
Anesthesia Evaluation  Patient identified by MRN, date of birth, ID band Patient awake    Reviewed: Allergy & Precautions, NPO status , Patient's Chart, lab work & pertinent test results  History of Anesthesia Complications (+) PONV, PROLONGED EMERGENCE, Family history of anesthesia reaction and history of anesthetic complications  Airway Mallampati: II  TM Distance: >3 FB Neck ROM: Full    Dental no notable dental hx. (+) Partial Upper,    Pulmonary    Pulmonary exam normal breath sounds clear to auscultation       Cardiovascular hypertension, + Peripheral Vascular Disease  PND:    Normal cardiovascular exam Rhythm:Regular Rate:Normal  ECHO 5/21  1. Left ventricular ejection fraction, by estimation, is 60 to 65%. The left ventricle has normal function. The left ventricle has no regional wall motion abnormalities. There is mild asymmetric left ventricular hypertrophy of the basal-septal segment.  Left ventricular diastolic parameters are consistent with Grade I diastolic dysfunction (impaired relaxation). Elevated left ventricular end-diastolic pressure. The average left ventricular global longitudinal strain is -13.4 %. 2. Right ventricular systolic function is normal.  3. The mitral valve is normal in structure.  4. The aortic valve is normal in structure.     Neuro/Psych  Neuromuscular disease    GI/Hepatic Neg liver ROS, GERD  Medicated and Controlled,  Endo/Other  Hypothyroidism   Renal/GU negative Renal ROS   Endometrial CA,     Musculoskeletal  (+) Arthritis , Osteoarthritis,    Abdominal   Peds  Hematology  (+) Blood dyscrasia, anemia ,   Anesthesia Other Findings   Reproductive/Obstetrics                            Anesthesia Physical  Anesthesia Plan  ASA: III  Anesthesia Plan: MAC   Post-op Pain Management:    Induction:   PONV Risk Score and Plan: Treatment may  vary due to age or medical condition  Airway Management Planned: Oral ETT  Additional Equipment: None  Intra-op Plan:   Post-operative Plan:   Informed Consent: I have reviewed the patients History and Physical, chart, labs and discussed the procedure including the risks, benefits and alternatives for the proposed anesthesia with the patient or authorized representative who has indicated his/her understanding and acceptance.     Dental advisory given  Plan Discussed with: CRNA and Anesthesiologist  Anesthesia Plan Comments: (EGD for Dysphagia)        Anesthesia Quick Evaluation

## 2019-12-22 NOTE — Progress Notes (Signed)
  Echocardiogram 2D Echocardiogram has been performed.  Breane Grunwald G Kerem Gilmer 12/22/2019, 3:06 PM

## 2019-12-22 NOTE — Progress Notes (Signed)
Name: Katie [name] MRN: [id number] DOB: 12/28/1940    ADMISSION DATE:  12/14/2019 CONSULTATION DATE:  12/22/2019  REFERRING MD :  Erlinda Hong , triad MD  CHIEF COMPLAINT:  Lung mass  BRIEF PATIENT DESCRIPTION: 79 year old never smoker with a remote history of endometrial cancer admitted with large right malignant pleural effusion and lung mass  SIGNIFICANT EVENTS [place] RT thora 11/10 >> 1.7 L  , protein 4.41, LDH 635, 79% lymphocytes EGD 11/10 >> normal esophagus, multiple gastric polyps  STUDIES:  CT chest/abd/pelvis 11/11 >> Right lower lobe mass extending into the right hilum and subcarinal region most consistent with malignancy. There is high-grade narrowing of the right middle and right lower lobe bronchi secondary to mass effect and compression or infiltration of the mass. 2. Moderate right pleural effusion with partial compressive atelectasis of the right lower lobe. 3. 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  She reports dysphagia worse to solids than liquids.  Shortness of breath and generalized weakness has been ongoing for a few weeks.  She reports 4 pound weight loss over the past 1 week.  She has a remote history of endometrial cancer in 2010 for which she required surgery, chemotherapy and radiation.  Chemotherapy caused hair loss and neuropathy. She was found to have moderate to large right pleural effusion and underwent ultrasound-guided thoracentesis with removal of 1.7 L of fluid.  This showed malignant cells. CT chest /abdomen and pelvis was performed which showed right lower lobe mass extending into the hilum and subcarinal region with narrowing of the right middle and lower lobe bronchi with pleural effusion and suggestion of pleural metastases She was evaluated by oncology, we are consulted for more tissue   She reports that her  breathing has improved after thoracentesis , some dysphagia persists, she was found to desaturate on walking and placed on oxygen by physical therapy today.   PAST MEDICAL HISTORY :   has a past medical history of Arthritis, Cancer ([place]), Complication of anesthesia, Endometrial cancer ([place]), Endometrial cancer ([place]) (12/18/2019), Family history of adverse reaction to anesthesia, GERD (gastroesophageal reflux disease), Glaucoma, Goals of care, counseling/discussion (12/18/2019), History of fainting (2021), Hypothyroidism, Neuromuscular disorder ([place]), Pleural effusion, malignant (12/18/2019), PONV (postoperative nausea and vomiting), and Thyroid disease.  has a past surgical history that includes Cholecystectomy; Abdominal hysterectomy; Appendectomy (1958); Femur Surgery (06/2013); Breast excisional biopsy (Right); Total knee arthroplasty (Right, 01/05/2019); Rotator cuff repair (2003); Breast surgery (Left); Cataract extraction (04/29/2019); Cataract extraction (Right, 09/2019); Esophagogastroduodenoscopy (egd) with propofol (N/A, 12/16/2019); biopsy (12/16/2019); and IR IMAGING GUIDED PORT INSERTION (12/21/2019).   SUBJECTIVE:  Pt reports "I am going to beat this".  Denies pain, shortness of breath.    VITAL SIGNS: Temp:  [98.2 F (36.8 C)-98.6 F (37 C)] 98.5 F (36.9 C) (11/16 0910) Pulse Rate:  [71-86] 77 (11/16 0458) Resp:  [15-27] 25 (11/16 0910) BP: (118-198)/(60-96) 156/76 (11/16 0910) SpO2:  [93 %-100 %] 97 % (11/16 0910) Weight:  [60.3 kg] 60.3 kg (11/16 0910)  PHYSICAL EXAMINATION: Gen:      No acute distress, elderly HEENT:  EOMI, sclera anicteric Neck:     No masses; no thyromegaly Lungs:    Clear to auscultation bilaterally; normal respiratory effort CV:         Regular rate and rhythm; no murmurs Abd:      + bowel sounds; soft, non-tender; no palpable masses, no distension Ext:  No edema; adequate peripheral perfusion Skin:      Warm and dry; no rash Neuro: alert and  oriented x 3 Psych: normal mood and affect   Recent Labs  Lab 12/19/19 0746 12/20/19 0632 12/21/19 0516  NA 131* 133* 132*  K 3.7 3.6 4.3  CL 97* 99 99  CO2 $Re'24 26 26  'LcD$ BUN $R'9 11 11  'iD$ CREATININE 0.74 0.64 0.62  GLUCOSE 104* 99 95   Recent Labs  Lab 12/16/19 0553 12/17/19 0527 12/20/19 0632  HGB 10.7* 10.3* 10.4*  HCT 33.7* 32.1* 32.9*  WBC 7.1 6.9 5.6  PLT 454* 450* 435*   DG Chest 2 View  Result Date: 12/21/2019 CLINICAL DATA:  Shortness of breath, history of pleural effusion. EXAM: CHEST - 2 VIEW COMPARISON:  12/16/2019 and CT 12/17/2019 FINDINGS: Examination demonstrates slight interval worsening of a moderate size right pleural effusion likely with associated right basilar atelectasis. Mild patchy density over the right mid to upper lung as seen on recent chest CT probably related to atelectasis or nodular pleural thickening. Cardiomediastinal silhouette and remainder of the exam is unchanged. IMPRESSION: 1. Slight interval worsening of a moderate size right pleural effusion likely with associated right basilar atelectasis. 2. Patchy density over the right mid to upper lung as seen on recent chest CT likely atelectasis or nodular pleural thickening. Right lower lobe mass as seen on CT not well visualized radiographically. Electronically Signed   By: Marin Olp M.D.   On: 12/21/2019 10:02   IR IMAGING GUIDED PORT INSERTION  Result Date: 12/22/2019 INDICATION: History of recurrent metastatic endometrial cancer. In need durable intravenous access for chemotherapy administration. EXAM: IMPLANTED PORT A CATH PLACEMENT WITH ULTRASOUND AND FLUOROSCOPIC GUIDANCE COMPARISON:  CT the chest, abdomen pelvis-12/17/2019 MEDICATIONS: Ancef 2 gm IV; The antibiotic was administered within an appropriate time interval prior to skin puncture. ANESTHESIA/SEDATION: Moderate (conscious) sedation was employed during this procedure. A total of Versed 2 mg and Fentanyl 100 mcg was administered  intravenously. Moderate Sedation Time: 22 minutes. The patient's level of consciousness and vital signs were monitored continuously by radiology nursing throughout the procedure under my direct supervision. CONTRAST:  None FLUOROSCOPY TIME:  16 seconds (4 mGy) COMPLICATIONS: None immediate. PROCEDURE: The procedure, risks, benefits, and alternatives were explained to the patient. Questions regarding the procedure were encouraged and answered. The patient understands and consents to the procedure. The right neck and chest were prepped with chlorhexidine in a sterile fashion, and a sterile drape was applied covering the operative field. Maximum barrier sterile technique with sterile gowns and gloves were used for the procedure. A timeout was performed prior to the initiation of the procedure. Local anesthesia was provided with 1% lidocaine with epinephrine. After creating a small venotomy incision, a micropuncture kit was utilized to access the internal jugular vein. Real-time ultrasound guidance was utilized for vascular access including the acquisition of a permanent ultrasound image documenting patency of the accessed vessel. The microwire was utilized to measure appropriate catheter length. A subcutaneous port pocket was then created along the upper chest wall utilizing a combination of sharp and blunt dissection. The pocket was irrigated with sterile saline. A single lumen "Slim" sized power injectable port was chosen for placement. The 8 Fr catheter was tunneled from the port pocket site to the venotomy incision. The port was placed in the pocket. The external catheter was trimmed to appropriate length. At the venotomy, an 8 Fr peel-away sheath was placed over a guidewire under fluoroscopic guidance. The catheter  was then placed through the sheath and the sheath was removed. Final catheter positioning was confirmed and documented with a fluoroscopic spot radiograph. The port was accessed with a Huber needle,  aspirated and flushed with heparinized saline. The venotomy site was closed with an interrupted 4-0 Vicryl suture. The port pocket incision was closed with interrupted 2-0 Vicryl suture. The skin was opposed with a running subcuticular 4-0 Vicryl suture. Dermabond and Steri-strips were applied to both incisions. Dressings were applied. The patient tolerated the procedure well without immediate post procedural complication. FINDINGS: After catheter placement, the tip lies within the superior cavoatrial junction. The catheter aspirates and flushes normally and is ready for immediate use. IMPRESSION: Successful placement of a right internal jugular approach power injectable Port-A-Cath. The catheter is ready for immediate use. Electronically Signed   By: Sandi Mariscal M.D.   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 Lung mass extends into the hilum and subcarinal region with narrowing of the right lower lobe bronchus which could be extrinsic compression or an endobronchial component.  Plan for endobronchial ultrasound today to obtain additional tissue for molecular studies.  Informed consent obtained Patient is stable for procedure.  Marshell Garfinkel MD [facility] Please see [email or web address] for pager details.  12/22/2019, 9:40 AM

## 2019-12-22 NOTE — Anesthesia Procedure Notes (Signed)
Procedure Name: Intubation Performed by: Rosaland Lao, CRNA Pre-anesthesia Checklist: Patient identified, Emergency Drugs available, Suction available and Patient being monitored Patient Re-evaluated:Patient Re-evaluated prior to induction Oxygen Delivery Method: Circle system utilized Preoxygenation: Pre-oxygenation with 100% oxygen Induction Type: IV induction Ventilation: Mask ventilation without difficulty Laryngoscope Size: Miller and 2 Tube type: Oral Tube size: 8.5 mm Number of attempts: 1 Airway Equipment and Method: Stylet Placement Confirmation: ETT inserted through vocal cords under direct vision,  positive ETCO2 and breath sounds checked- equal and bilateral Tube secured with: Tape Dental Injury: Teeth and Oropharynx as per pre-operative assessment

## 2019-12-23 ENCOUNTER — Encounter (HOSPITAL_COMMUNITY): Payer: Self-pay | Admitting: Pulmonary Disease

## 2019-12-23 ENCOUNTER — Telehealth: Payer: Self-pay | Admitting: *Deleted

## 2019-12-23 ENCOUNTER — Inpatient Hospital Stay (HOSPITAL_COMMUNITY): Payer: Medicare Other

## 2019-12-23 DIAGNOSIS — R531 Weakness: Secondary | ICD-10-CM | POA: Diagnosis not present

## 2019-12-23 LAB — CYTOLOGY - NON PAP

## 2019-12-23 MED ORDER — LIDOCAINE HCL 1 % IJ SOLN
INTRAMUSCULAR | Status: AC
  Filled 2019-12-23: qty 20

## 2019-12-23 NOTE — Progress Notes (Signed)
Katie [name] did well yesterday.  It was a busy day for her yesterday.  She had her bronchoscopy.  Dr. Vaughan Browner did a fantastic job.  We should have enough tissue for molecular studies.  We will await the pathology report.  She has not had her thoracentesis yet.  She really needs to have this done.  The echocardiogram looked okay.  Her appetite is quite good.  She has had no problem with dysphagia or odynophagia.  She is trying to get to rehab.  She really wants to go to rehab to get stronger.  She has had no fever.  There has been no bleeding.  She has had no nausea or vomiting.  Her vital signs look stable.  Blood pressure is on the higher side at 160/89.  Temperature is 97.9.  Pulse is 82.  Her lungs sound good on the left side.  Right side shows some decreased.  She has decent breath sounds.  Cardiac exam regular rate and rhythm.  Abdomen is soft.  Neurological exam is nonfocal.  Katie [name] has likely recurrent endometrial cancer.  Its been 11 years since she had her initial diagnosis.  This is a little bit unusual.  Her presentation is also a little bit unusual.  We got tissue for evaluation.  We will be able to send off molecular markers.  I would like to hope that a rehab bed can be found for her soon.  Again, the staff on [address] are doing a great job with her.  Lattie Haw, MD  Lurena Joiner 1:37

## 2019-12-23 NOTE — Progress Notes (Signed)
Occupational Therapy Progress Note  Patient very motivated to mobilize, at min G level for functional ambulation and transfers due to mild unsteadiness. Patient maintaining in mid 90s on 2L during ambulation and ADLs. Min G for toilet transfer and clothing management with cue to stabilize with grab bar. Patient does require multiple seated rest breaks with activity, reports due to LE neuropathy.    12/23/19 1407  OT Visit Information  Last OT Received On 12/23/19  Assistance Needed +1  PT/OT/SLP Co-Evaluation/Treatment Yes  Reason for Co-Treatment For patient/therapist safety  OT goals addressed during session ADL's and self-care  History of Present Illness Katie [name] is a 79 y.o. female with medical history significant for endometrial cancer in remission, hypothyroidism, GERD, osteoporosis who presented to [facility] ED du e to gradually worsening generalized weakness, poor oral intake and dysphagia  of 2 weeks duration,  unintentional weight loss. S/P thoracentesis.  Precautions  Precautions Fall  Precaution Comments monitor sats , on 2 L  Pain Assessment  Pain Assessment No/denies pain  Cognition  Arousal/Alertness Awake/alert  Behavior During Therapy WFL for tasks assessed/performed  Overall Cognitive Status Within Functional Limits for tasks assessed  ADL  Overall ADL's  Needs assistance/impaired  Grooming Wash/dry hands;Min guard;Standing  Lower Body Dressing Moderate assistance;Sitting/lateral leans  Lower Body Dressing Details (indicate cue type and reason) for slippers, assist to slide heels into slippers  Toilet Transfer Min guard;Ambulation;Cueing for safety;Grab bars [facility])  Toilet Transfer Details (indicate cue type and reason) min guard for safety  Toileting- Clothing Manipulation and Hygiene Min guard;Sit to/from stand  Toileting - Clothing Manipulation Details (indicate cue type and reason) for balance with pulling up underwear  Functional mobility during ADLs Min  guard;Rolling walker  General ADL Comments patient maintaining mid 90s on 2L O2 with ADLs  Bed Mobility  Overal bed mobility Needs Assistance  Bed Mobility Supine to Sit  Supine to sit Modified independent (Device/Increase time)  General bed mobility comments HOB elevated   Balance  Overall balance assessment Needs assistance  Sitting-balance support Feet supported  Sitting balance-Leahy Scale Good  Standing balance support During functional activity;Single extremity supported  Standing balance-Leahy Scale Poor  Standing balance comment reliant on UE support (SPC or rollator)  Transfers  Overall transfer level Needs assistance  Equipment used Rolling walker (2 wheeled);Straight cane  Transfers Sit to/from Stand  Sit to [employer] guard  General transfer comment min G for safety  OT - End of Session  Equipment Utilized During Treatment Rolling walker;Oxygen  Activity Tolerance Patient tolerated treatment well  Patient left in chair;with call bell/phone within reach;with chair alarm set;with family/visitor present  Nurse Communication Mobility status  OT Assessment/Plan  OT Plan Discharge plan remains appropriate  OT Visit Diagnosis Other abnormalities of gait and mobility (R26.89);Muscle weakness (generalized) (M62.81)  OT Frequency (ACUTE ONLY) Min 2X/week  Follow Up Recommendations SNF;Supervision/Assistance - 24 hour  OT Equipment None recommended by OT  AM-PAC OT "6 Clicks" Daily Activity Outcome Measure (Version 2)  Help from another person eating meals? 4  Help from another person taking care of personal grooming? 3  Help from another person toileting, which includes using toliet, bedpan, or urinal? 3  Help from another person bathing (including washing, rinsing, drying)? 3  Help from another person to put on and taking off regular upper body clothing? 3  Help from another person to put on and taking off regular lower body clothing? 2  6 Click Score 18  OT Goal Progression  Progress towards OT goals Progressing toward goals  Acute Rehab OT Goals  Patient Stated Goal to rehab and then home  OT Goal Formulation With patient  Time For Goal Achievement 12/29/19  Potential to Achieve Goals Good  ADL Goals  Pt Will Perform Grooming with supervision;sitting;standing  Pt Will Perform Upper Body Dressing with supervision;sitting  Pt Will Perform Lower Body Dressing with supervision;sit to/from stand;sitting/lateral leans  Pt Will Transfer to Toilet with supervision;ambulating;regular height toilet (walker)  Pt Will Perform Toileting - Clothing Manipulation and hygiene with supervision;sit to/from stand;sitting/lateral leans  OT Time Calculation  OT Start Time (ACUTE ONLY) 1212  OT Stop Time (ACUTE ONLY) 1237  OT Time Calculation (min) 25 min  OT General Charges  $OT Visit 1 Visit  OT Treatments  $Self Care/Home Management  8-22 mins   Delbert Phenix OT OT pager: [phone]

## 2019-12-23 NOTE — Progress Notes (Addendum)
PROGRESS NOTE    [name]  LTJ:[id number] DOB: 23-Aug-1940 DOA: 12/14/2019 PCP: [name], NP   Chief Complain: Weakness, dysphagia  Brief Narrative:  Patient is 80 year old female with history of endometrial cancer diagnosed about 10 years ago, status post extensive surgery/radiation in 2011, currently cancer remission, hypothyroidism, GERD, osteoporosis who presented to the [facility] emergency department for the evaluation of generalized weakness, poor oral intake, dysphagia.  She also reported unintentional weight loss, intermittent diarrhea, nonproductive cough.  On presentation, she was found to have large right-sided pleural effusion and was hypoxic.  She underwent thoracentesis.  CT chest/abdomen/pelvis showed right lower lobe mass extending into the right hilum.  Oncology recommended bronchoscopy biopsy for tissue specimen.  Underwent bronc on 12/22/19.  Awaiting another right-sided thoracentesis today.  Ultimate plan is to discharge her to the skilled nursing facility.  Assessment & Plan:   Principal Problem:   Generalized weakness Active Problems:   Acquired hypothyroidism   Chronic diarrhea   Essential hypertension   Dysphagia   Volume depletion   Gastric polyp   Endometrial cancer (HCC)   Pleural effusion, malignant   Goals of care, counseling/discussion   Right lung mass with malignant effusion/endometrial cancer: CT imaging on presentation showed right lower lobe mass extending into the right hilum, moderate pleural effusion.She underwent   endobronchial ultrasound to obtain additional tissue for molecular studies. Patient has endometrial  cancer diagnosed about 10 years ago.  She is status post extensive surgery/radiation 2011.  Cancer was in remission until now. Oncology following here. Pleural fluid Gram stain did not show any organisms, culture did not show any growth.  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 IR  put a Port-A-Cath  for chemotherapy plan.  Acute hypoxic respiratory failure: Most likely secondary to pleural effusion.  She presented with hypoxia, tachypnea.  Currently needing 2 L oxygen/min for maintenance of saturation.  She underwent ultrasound-guided left-sided thoracentesis with removal of 1.7 L of bloody fluid.  Chest x-ray done on 11/16 showed moderate right-sided pleural effusion, plan for another ultrasound-guided thoracentesis today.  Dysphagia: Patient presented with poor oral intake, difficulty swallowing.  Underwent EGD.  Source of dysphagia not identified.  Currently on PPI.  She may need outpatient esophageal manometry.  Dysphagia suspected to be from metastatic cancer in the lung causing compression to the esophagus.  Currently she is tolerating diet.  Hypertension: Currently blood pressure is stable.On lopressor  Bilateral lower extremity edema: Does not tolerate diuretics due to hypotension.  Currently she does not have significant edema.  Normocytic anemia: Associated with malignancy.  Currently hemoglobin is still in the range of 10.  Thrombocytosis: Most likely reactive  History of cholecystectomy/loose stools: On Cholestyramine.  Follows with GI as an outpatient  Hypothyroidism: Continue Synthyroid  Osteoporosis: On Fosamax at home  Generalized weakness: PT/OT consulted and recommended skilled nursing facility          DVT prophylaxis:SCD Code Status: DNR Family Communication: Called and discussed with daughter on phone on 12/22/19 Status is: Inpatient  Remains inpatient appropriate because:Inpatient level of care appropriate due to severity of illness   Dispo:  Patient From: Home  Planned Disposition: SNF  Expected discharge date: 12/24/19  Medically stable for discharge: Yes    Consultants: PCCM,oncology,iR  Procedures: Thoracentesis  Antimicrobials:  Anti-infectives (From admission, onward)   Start     Dose/Rate Route Frequency Ordered Stop   12/21/19  1115  ceFAZolin (ANCEF) IVPB 2g/100 mL premix  2 g 200 mL/hr over 30 Minutes Intravenous  Once 12/21/19 1027 12/21/19 1720      Subjective:  Patient seen and examined the bedside this morning.  Hemodynamically stable during my evaluation.  Is having some cough today most likely secondary to right-sided pleural effusion.  She is awaiting for thoracentesis since yesterday.  Case  manager assisting with placement,  Objective: Vitals:   12/22/19 2009 12/23/19 0421 12/23/19 0810 12/23/19 1102  BP: 123/70 (!) 160/89 113/62 (!) 104/55  Pulse: 98 82 78 90  Resp: 18 15    Temp: 98.1 F (36.7 C) 97.9 F (36.6 C) 97.8 F (36.6 C)   TempSrc: Oral Oral Oral   SpO2: 95% 90% 94%   Weight:      Height:        Intake/Output Summary (Last 24 hours) at 12/23/2019 1300 Last data filed at 12/22/2019 1830 Gross per 24 hour  Intake 240 ml  Output --  Net 240 ml   Filed Weights   12/14/19 1644 12/16/19 0943 12/22/19 0910  Weight: 60.3 kg 60.5 kg 60.3 kg    Examination:   General exam: Very pleasant elderly female, deconditioned, debilitated HEENT:PERRL,Oral mucosa moist, Ear/Nose normal on gross exam Respiratory system: Diminished air sounds on the right side Cardiovascular system: S1 & S2 heard, RRR. No JVD, murmurs, rubs, gallops or clicks.  Port-A-Cath on the right chest Gastrointestinal system: Abdomen is nondistended, soft and nontender. No organomegaly or masses felt. Normal bowel sounds heard. Central nervous system: Alert and oriented. No focal neurological deficits. Extremities: No edema, no clubbing ,no cyanosis, distal peripheral pulses palpable. Skin: No rashes, lesions or ulcers,no icterus ,no pallor    Data Reviewed: I have personally reviewed following labs and imaging studies  CBC: Recent Labs  Lab 12/17/19 0527 12/20/19 0632  WBC 6.9 5.6  NEUTROABS 5.3 3.4  HGB 10.3* 10.4*  HCT 32.1* 32.9*  MCV 89.2 89.6  PLT 450* 811*   Basic Metabolic Panel: Recent  Labs  Lab 12/17/19 0527 12/19/19 0746 12/20/19 0632 12/21/19 0516  NA 128* 131* 133* 132*  K 3.6 3.7 3.6 4.3  CL 98 97* 99 99  CO2 _0 GLUCOSE 89 104* 99 95  BUN _1 CREATININE 0.56 0.74 0.64 0.62  CALCIUM 7.8* 8.2* 8.4* 8.4*  MG 1.6* 2.0 1.9 1.9   GFR: Estimated Creatinine Clearance: 44.8 mL/min (by C-G formula based on SCr of 0.62 mg/dL). Liver Function Tests: No results for input(s): AST, ALT, ALKPHOS, BILITOT, PROT, ALBUMIN in the last 168 hours. No results for input(s): LIPASE, AMYLASE in the last 168 hours. No results for input(s): AMMONIA in the last 168 hours. Coagulation Profile: No results for input(s): INR, PROTIME in the last 168 hours. Cardiac Enzymes: No results for input(s): CKTOTAL, CKMB, CKMBINDEX, TROPONINI in the last 168 hours. BNP (last 3 results) No results for input(s): PROBNP in the last 8760 hours. HbA1C: No results for input(s): HGBA1C in the last 72 hours. CBG: No results for input(s): GLUCAP in the last 168 hours. Lipid Profile: No results for input(s): CHOL, HDL, LDLCALC, TRIG, CHOLHDL, LDLDIRECT in the last 72 hours. Thyroid Function Tests: No results for input(s): TSH, T4TOTAL, FREET4, T3FREE, THYROIDAB in the last 72 hours. Anemia Panel: No results for input(s): VITAMINB12, FOLATE, FERRITIN, TIBC, IRON, RETICCTPCT in the last 72 hours. Sepsis Labs: No results for input(s): PROCALCITON, LATICACIDVEN in the last 168 hours.  Recent Results (from the past 240 hour(s))  Respiratory Panel  by RT PCR (Flu A&B, Covid) - Nasopharyngeal Swab     Status: None   Collection Time: 12/14/19 10:39 PM   Specimen: Nasopharyngeal Swab  Result Value Ref Range Status   SARS Coronavirus 2 by RT PCR NEGATIVE NEGATIVE Final    Comment: (NOTE) SARS-CoV-2 target nucleic acids are NOT DETECTED.  The SARS-CoV-2 RNA is generally detectable in upper respiratoy specimens during the acute phase of infection. The lowest concentration of SARS-CoV-2  viral copies this assay can detect is 131 copies/mL. A negative result does not preclude SARS-Cov-2 infection and should not be used as the sole basis for treatment or other patient management decisions. A negative result may occur with  improper specimen collection/handling, submission of specimen other than nasopharyngeal swab, presence of viral mutation(s) within the areas targeted by this assay, and inadequate number of viral copies (<131 copies/mL). A negative result must be combined with clinical observations, patient history, and epidemiological information. The expected result is Negative.  Fact Sheet for Patients:  PinkCheek.be  Fact Sheet for Healthcare Providers:  GravelBags.it  This test is no t yet approved or cleared by the [place] FDA and  has been authorized for detection and/or diagnosis of SARS-CoV-2 by FDA under an Emergency Use Authorization (EUA). This EUA will remain  in effect (meaning this test can be used) for the duration of the COVID-19 declaration under Section 564(b)(1) of the Act, 21 U.S.C. section 360bbb-3(b)(1), unless the authorization is terminated or revoked sooner.     Influenza A by PCR NEGATIVE NEGATIVE Final   Influenza B by PCR NEGATIVE NEGATIVE Final    Comment: (NOTE) The Xpert Xpress SARS-CoV-2/FLU/RSV assay is intended as an aid in  the diagnosis of influenza from Nasopharyngeal swab specimens and  should not be used as a sole basis for treatment. Nasal washings and  aspirates are unacceptable for Xpert Xpress SARS-CoV-2/FLU/RSV  testing.  Fact Sheet for Patients: PinkCheek.be  Fact Sheet for Healthcare Providers: GravelBags.it  This test is not yet approved or cleared by the [place] FDA and  has been authorized for detection and/or diagnosis of SARS-CoV-2 by  FDA under an Emergency Use Authorization (EUA).  This EUA will remain  in effect (meaning this test can be used) for the duration of the  Covid-19 declaration under Section 564(b)(1) of the Act, 21  U.S.C. section 360bbb-3(b)(1), unless the authorization is  terminated or revoked. Performed at [facility], [place] [address], [place], [place] [address]   Body fluid culture     Status: None   Collection Time: 12/16/19  9:37 AM   Specimen: PATH Cytology Misc. fluid; Pleural Fluid  Result Value Ref Range Status   Specimen Description   Final    PLEURAL Performed at [place] [address] [place], [place] [address]    Special Requests   Final    NONE Performed at [facility], [place] [address], [place], [place] [address]    Gram Stain   Final    RARE WBC PRESENT, PREDOMINANTLY MONONUCLEAR NO ORGANISMS SEEN    Culture   Final    NO GROWTH Performed at [facility], [place] [address], [place], [place] [address]    Report Status 12/19/2019 FINAL  Final         Radiology Studies: DG CHEST PORT 1 VIEW  Result Date: 12/22/2019 CLINICAL DATA:  Status post bronchoscopy. EXAM: PORTABLE CHEST 1 VIEW COMPARISON:  Yesterday. FINDINGS: Interval right jugular porta catheter  with its tip in the upper right atrium. No pneumothorax. Normal sized heart. Tortuous and partially calcified thoracic aorta. Moderately large right pleural effusion, mildly increased. Air oval mild right basilar atelectasis. Diffuse osteopenia. Right shoulder degenerative changes and fixation anchors. IMPRESSION: 1. No pneumothorax following bronchoscopy. 2. Moderately large right pleural effusion, mildly increased. 3. Mild right basilar atelectasis. Electronically Signed   By: Claudie Revering M.D.   On: 12/22/2019 11:50   ECHOCARDIOGRAM COMPLETE  Result Date: 12/22/2019    ECHOCARDIOGRAM REPORT   Patient Name:   Katie [name] Date of Exam: 12/22/2019 Medical Rec #:  [id number]         Height:       60.0 in  Accession #:    [id number]        Weight:       133.0 lb Date of Birth:  [birth year]         BSA:          1.569 m Patient Age:    68 years          BP:           179/78 mmHg Patient Gender: F                 HR:           72 bpm. Exam Location:  Inpatient Procedure: 2D Echo, 3D Echo, Cardiac Doppler, Color Doppler and Strain Analysis Indications:    Z51.11 Encounter for antineoplastic chemotheraphy  History:        Patient has no prior history of Echocardiogram examinations,                 most recent 06/28/2019. Cancer. Pleural Effusion. GERD.                 Hypothyroidism.  Sonographer:    Jonelle Sidle Dance Referring Phys: [place]  1. Left ventricular ejection fraction, by estimation, is 60%. The left ventricle has normal function. The left ventricle has no regional wall motion abnormalities. There is mild left ventricular hypertrophy. Left ventricular diastolic parameters are consistent with Grade I diastolic dysfunction (impaired relaxation). The average left ventricular global longitudinal strain is -13.8 %. The global longitudinal strain is abnormal. GLS has suboptimal tracking, but in a similar location to prior study, and no significant change as compared to 06/28/19.  2. Right ventricular systolic function is normal. The right ventricular size is normal. Tricuspid regurgitation signal is inadequate for assessing PA pressure.  3. The mitral valve is normal in structure. Mild mitral valve regurgitation. No evidence of mitral stenosis. Moderate mitral annular calcification.  4. The aortic valve is tricuspid. There is mild calcification of the aortic valve. Aortic valve regurgitation is not visualized. No aortic stenosis is present.  5. The inferior vena cava is normal in size with greater than 50% respiratory variability, suggesting right atrial pressure of 3 mmHg. FINDINGS  Left Ventricle: Left ventricular ejection fraction, by estimation, is 60%. The left ventricle has normal function.  The left ventricle has no regional wall motion abnormalities. The average left ventricular global longitudinal strain is -13.8 %. The global longitudinal strain is abnormal. The left ventricular internal cavity size was normal in size. There is mild left ventricular hypertrophy. Left ventricular diastolic parameters are consistent with Grade I diastolic dysfunction (impaired relaxation). Right Ventricle: The right ventricular size is normal. No increase in right ventricular wall thickness. Right ventricular systolic function is normal. Tricuspid regurgitation signal is inadequate for  assessing PA pressure. Left Atrium: Left atrial size was normal in size. Right Atrium: Right atrial size was normal in size. Pericardium: There is no evidence of pericardial effusion. Mitral Valve: The mitral valve is normal in structure. Moderate mitral annular calcification. Mild mitral valve regurgitation. No evidence of mitral valve stenosis. Tricuspid Valve: The tricuspid valve is normal in structure. Tricuspid valve regurgitation is not demonstrated. No evidence of tricuspid stenosis. Aortic Valve: The aortic valve is tricuspid. There is mild calcification of the aortic valve. Aortic valve regurgitation is not visualized. No aortic stenosis is present. Pulmonic Valve: The pulmonic valve was grossly normal. Pulmonic valve regurgitation is not visualized. No evidence of pulmonic stenosis. Aorta: The aortic root is normal in size and structure. Venous: The inferior vena cava is normal in size with greater than 50% respiratory variability, suggesting right atrial pressure of 3 mmHg. IAS/Shunts: No atrial level shunt detected by color flow Doppler.  LEFT VENTRICLE PLAX 2D LVIDd:         3.90 cm  Diastology LVIDs:         3.00 cm  LV e' medial:    0.05 cm/s LV PW:         1.00 cm  LV E/e' medial:  17.5 LV IVS:        1.00 cm  LV e' lateral:   0.05 cm/s LVOT diam:     1.80 cm  LV E/e' lateral: 16.4 LV SV:         43 LV SV Index:   27        2D Longitudinal Strain LVOT Area:     2.54 cm 2D Strain GLS Avg:     -13.8 %  RIGHT VENTRICLE             IVC RV Basal diam:  2.40 cm     IVC diam: 1.00 cm RV S prime:     10.90 cm/s TAPSE (M-mode): 1.5 cm LEFT ATRIUM             Index       RIGHT ATRIUM           Index LA diam:        3.70 cm 2.36 cm/m  RA Area:     10.50 cm LA Vol (A2C):   30.6 ml 19.50 ml/m RA Volume:   22.80 ml  14.53 ml/m LA Vol (A4C):   15.8 ml 10.07 ml/m LA Biplane Vol: 22.1 ml 14.08 ml/m  AORTIC VALVE LVOT Vmax:   83.60 cm/s LVOT Vmean:  59.100 cm/s LVOT VTI:    0.168 m  AORTA Ao Root diam: 3.10 cm Ao Asc diam:  3.00 cm MITRAL VALVE MV Area (PHT): 2.34 cm     SHUNTS MV Area VTI:   1.37 cm     Systemic VTI:  0.17 m MV VTI:        0.31 m       Systemic Diam: 1.80 cm MV Decel Time: 324 msec MV E velocity: 0.84 cm/s MV A velocity: 132.00 cm/s MV E/A ratio:  0.01 Cherlynn Kaiser MD Electronically signed by Cherlynn Kaiser MD Signature Date/Time: 12/22/2019/4:39:22 PM    Final    IR IMAGING GUIDED PORT INSERTION  Result Date: 12/22/2019 INDICATION: History of recurrent metastatic endometrial cancer. In need durable intravenous access for chemotherapy administration. EXAM: IMPLANTED PORT A CATH PLACEMENT WITH ULTRASOUND AND FLUOROSCOPIC GUIDANCE COMPARISON:  CT the chest, abdomen pelvis-12/17/2019 MEDICATIONS: Ancef 2 gm IV; The antibiotic was administered within  an appropriate time interval prior to skin puncture. ANESTHESIA/SEDATION: Moderate (conscious) sedation was employed during this procedure. A total of Versed 2 mg and Fentanyl 100 mcg was administered intravenously. Moderate Sedation Time: 22 minutes. The patient's level of consciousness and vital signs were monitored continuously by radiology nursing throughout the procedure under my direct supervision. CONTRAST:  None FLUOROSCOPY TIME:  16 seconds (4 mGy) COMPLICATIONS: None immediate. PROCEDURE: The procedure, risks, benefits, and alternatives were explained to the  patient. Questions regarding the procedure were encouraged and answered. The patient understands and consents to the procedure. The right neck and chest were prepped with chlorhexidine in a sterile fashion, and a sterile drape was applied covering the operative field. Maximum barrier sterile technique with sterile gowns and gloves were used for the procedure. A timeout was performed prior to the initiation of the procedure. Local anesthesia was provided with 1% lidocaine with epinephrine. After creating a small venotomy incision, a micropuncture kit was utilized to access the internal jugular vein. Real-time ultrasound guidance was utilized for vascular access including the acquisition of a permanent ultrasound image documenting patency of the accessed vessel. The microwire was utilized to measure appropriate catheter length. A subcutaneous port pocket was then created along the upper chest wall utilizing a combination of sharp and blunt dissection. The pocket was irrigated with sterile saline. A single lumen "Slim" sized power injectable port was chosen for placement. The 8 Fr catheter was tunneled from the port pocket site to the venotomy incision. The port was placed in the pocket. The external catheter was trimmed to appropriate length. At the venotomy, an 8 Fr peel-away sheath was placed over a guidewire under fluoroscopic guidance. The catheter was then placed through the sheath and the sheath was removed. Final catheter positioning was confirmed and documented with a fluoroscopic spot radiograph. The port was accessed with a Huber needle, aspirated and flushed with heparinized saline. The venotomy site was closed with an interrupted 4-0 Vicryl suture. The port pocket incision was closed with interrupted 2-0 Vicryl suture. The skin was opposed with a running subcuticular 4-0 Vicryl suture. Dermabond and Steri-strips were applied to both incisions. Dressings were applied. The patient tolerated the procedure  well without immediate post procedural complication. FINDINGS: After catheter placement, the tip lies within the superior cavoatrial junction. The catheter aspirates and flushes normally and is ready for immediate use. IMPRESSION: Successful placement of a right internal jugular approach power injectable Port-A-Cath. The catheter is ready for immediate use. Electronically Signed   By: Sandi Mariscal M.D.   On: 12/22/2019 08:29        Scheduled Meds: . Chlorhexidine Gluconate Cloth  6 each Topical Daily  . cholestyramine light  4 g Oral Q2000  . enoxaparin (LOVENOX) injection  40 mg Subcutaneous Daily  . levothyroxine  75 mcg Oral QAC breakfast  . loratadine  10 mg Oral Daily  . megestrol  160 mg Oral Daily  . metoprolol tartrate  12.5 mg Oral BID  . pantoprazole  40 mg Oral BID  . vitamin B-12  1,000 mcg Oral Daily   Continuous Infusions:    LOS: 8 days    Time spent: 35 mins.More than 50% of that time was spent in counseling and/or coordination of care.      Shelly Coss, MD [facility] P11/17/2021, 1:00 PM

## 2019-12-23 NOTE — Progress Notes (Signed)
Physical Therapy Treatment Patient Details Name: Katie [name] MRN: [id number] DOB: 07-11-1940 Today's Date: 12/23/2019    History of Present Illness Katie [name] is a 79 y.o. female with medical history significant for endometrial cancer  in remission, hypothyroidism, GERD, osteoporosis who presented to [facility] ED du e to gradually worsening generalized weakness, poor oral intake and dysphagia  of 2 weeks duration,  unintentional weight loss. S/P thoracentesis.    PT Comments    The patient is eager to ambulate. Patient ambulated with 3 seated rest breaks. Total of 330' using 2 L O2. SPO@ remained > 91%. Noted SPO2 increases to 95% while ambulating.   Plans for Rehab  At DC. Continue PT for progressive mobility.  Follow Up Recommendations  SNF     Equipment Recommendations  None recommended by PT    Recommendations for Other Services       Precautions / Restrictions Precautions Precaution Comments: monitor sats , on 2 L Restrictions Weight Bearing Restrictions: No    Mobility  Bed Mobility   Bed Mobility: Supine to Sit              Transfers Overall transfer level: Needs assistance Equipment used: Rolling walker (2 wheeled) Transfers: Sit to/from Stand Sit to Stand: Supervision            Ambulation/Gait Ambulation/Gait assistance: Min guard Gait Distance (Feet): 80 Feet (then 80 then100 then 70) Assistive device: Rolling walker (2 wheeled) Gait Pattern/deviations: Step-through pattern Gait velocity: decreased   General Gait Details: seated rest breaks x 3 for the duration of the distance   Stairs             Wheelchair Mobility    Modified Rankin (Stroke Patients Only)       Balance   Sitting-balance support: Feet supported Sitting balance-Leahy Scale: Good     Standing balance support: During functional activity;Single extremity supported Standing balance-Leahy Scale: Fair Standing balance comment: reliant on UE support (SPC  or rollator)                            Cognition  [facility]                                            Exercises      General Comments        Pertinent Vitals/Pain Pain Assessment: No/denies pain    Home Living                      Prior Function            PT Goals (current goals can now be found in the care plan section) Progress towards PT goals: Progressing toward goals    Frequency    Min 2X/week      PT Plan Current plan remains appropriate;Frequency needs to be updated    Co-evaluation PT/OT/SLP Co-Evaluation/Treatment: Yes Reason for Co-Treatment: For patient/therapist safety PT goals addressed during session: Mobility/safety with mobility        AM-PAC PT "6 Clicks" Mobility   Outcome Measure  Help needed turning from your back to your side while in a flat bed without using bedrails?: None Help needed moving from lying on your back to sitting on the side of a flat bed without using bedrails?: None Help needed moving to and from  a bed to a chair (including a wheelchair)?: A Little Help needed standing up from a chair using your arms (e.g., wheelchair or bedside chair)?: A Little Help needed to walk in hospital room?: A Little Help needed climbing 3-5 steps with a railing? : A Lot 6 Click Score: 19    End of Session Equipment Utilized During Treatment: Oxygen Activity Tolerance: Patient tolerated treatment well Patient left: in chair;with call bell/phone within reach;with family/visitor present Nurse Communication: Mobility status PT Visit Diagnosis: History of falling (Z91.81);Difficulty in walking, not elsewhere classified (R26.2)     Time: 2956-2130 PT Time Calculation (min) (ACUTE ONLY): 24 min  Charges:  $Gait Training: 8-22 mins                     [place] Pager [phone] Office [phone]    Claretha Cooper 12/23/2019, 2:08 PM

## 2019-12-23 NOTE — Procedures (Signed)
PROCEDURE SUMMARY:  Successful US guided right thoracentesis. Yielded 400 mL of blood-tinged fluid. Pt tolerated procedure well. No immediate complications.  Unable to remove all fluid due to presence of adhesions.   Specimen was not sent for labs. CXR ordered.  EBL < 5 mL  Docia Barrier PA-C 12/23/2019 3:39 PM

## 2019-12-23 NOTE — Anesthesia Postprocedure Evaluation (Signed)
Anesthesia Post Note  Patient: Penni Homans  Procedure(s) Performed: ENDOBRONCHIAL ULTRASOUND (Bilateral ) BRONCHIAL BRUSHINGS BRONCHIAL BIOPSIES VIDEO BRONCHOSCOPY     Patient location during evaluation: PACU Anesthesia Type: General Level of consciousness: awake and alert Pain management: pain level controlled Vital Signs Assessment: post-procedure vital signs reviewed and stable Respiratory status: spontaneous breathing, nonlabored ventilation, respiratory function stable and patient connected to nasal cannula oxygen Cardiovascular status: blood pressure returned to baseline and stable Postop Assessment: no apparent nausea or vomiting Anesthetic complications: no   No complications documented.  Last Vitals:  Vitals:   12/22/19 2009 12/23/19 0421  BP: 123/70 (!) 160/89  Pulse: 98 82  Resp: 18 15  Temp: 36.7 C 36.6 C  SpO2: 95% 90%    Last Pain:  Vitals:   12/23/19 0421  TempSrc: Oral  PainSc:                  Toni Hoffmeister

## 2019-12-23 NOTE — Telephone Encounter (Signed)
Call placed to [facility] to obtain records from Dr. Minta Balsam office per order of Dr. Marin Olp.  Per Kennyth Lose in Medical Records, no records are available for patient. Dr. Marin Olp notified.

## 2019-12-24 ENCOUNTER — Encounter: Payer: Self-pay | Admitting: *Deleted

## 2019-12-24 DIAGNOSIS — C541 Malignant neoplasm of endometrium: Secondary | ICD-10-CM | POA: Diagnosis present

## 2019-12-24 DIAGNOSIS — M6281 Muscle weakness (generalized): Secondary | ICD-10-CM | POA: Diagnosis present

## 2019-12-24 DIAGNOSIS — I10 Essential (primary) hypertension: Secondary | ICD-10-CM | POA: Diagnosis present

## 2019-12-24 DIAGNOSIS — Z7401 Bed confinement status: Secondary | ICD-10-CM | POA: Diagnosis not present

## 2019-12-24 DIAGNOSIS — J9621 Acute and chronic respiratory failure with hypoxia: Secondary | ICD-10-CM | POA: Diagnosis present

## 2019-12-24 DIAGNOSIS — E039 Hypothyroidism, unspecified: Secondary | ICD-10-CM | POA: Diagnosis present

## 2019-12-24 DIAGNOSIS — R1312 Dysphagia, oropharyngeal phase: Secondary | ICD-10-CM | POA: Diagnosis present

## 2019-12-24 DIAGNOSIS — R531 Weakness: Secondary | ICD-10-CM | POA: Diagnosis not present

## 2019-12-24 DIAGNOSIS — M2550 Pain in unspecified joint: Secondary | ICD-10-CM | POA: Diagnosis not present

## 2019-12-24 DIAGNOSIS — Q782 Osteopetrosis: Secondary | ICD-10-CM | POA: Diagnosis not present

## 2019-12-24 DIAGNOSIS — R52 Pain, unspecified: Secondary | ICD-10-CM | POA: Diagnosis not present

## 2019-12-24 DIAGNOSIS — D649 Anemia, unspecified: Secondary | ICD-10-CM | POA: Diagnosis present

## 2019-12-24 DIAGNOSIS — R5381 Other malaise: Secondary | ICD-10-CM | POA: Diagnosis not present

## 2019-12-24 MED ORDER — PANTOPRAZOLE SODIUM 40 MG PO TBEC
40.0000 mg | DELAYED_RELEASE_TABLET | Freq: Two times a day (BID) | ORAL | Status: DC
Start: 2019-12-24 — End: 2020-01-08

## 2019-12-24 MED ORDER — MEGESTROL ACETATE 40 MG PO TABS: 160.0000 mg | ORAL_TABLET | Freq: Every day | ORAL | Status: DC

## 2019-12-24 MED ORDER — HEPARIN SOD (PORK) LOCK FLUSH 100 UNIT/ML IV SOLN
500.0000 [IU] | INTRAVENOUS | Status: DC | PRN
  Filled 2019-12-24: qty 5

## 2019-12-24 MED ORDER — DEXTROMETHORPHAN HBR 15 MG/5ML PO SYRP: 5.0000 mL | ORAL_SOLUTION | Freq: Four times a day (QID) | ORAL | 0 refills | Status: DC | PRN

## 2019-12-24 MED ORDER — METOPROLOL TARTRATE 25 MG PO TABS
12.5000 mg | ORAL_TABLET | Freq: Two times a day (BID) | ORAL | Status: DC
Start: 2019-12-24 — End: 2020-02-15

## 2019-12-24 NOTE — Progress Notes (Signed)
Attempted to call report to [facility] x2. No answer. Will attempt again.

## 2019-12-24 NOTE — Progress Notes (Signed)
Katie [name] is doing well.  She had a busy day yesterday with the thoracentesis.  40 cc of fluid was removed from the right lung.  She is still hungry.  She is having no problems with dysphagia or odynophagia.  We are awaiting the path from the bronchoscopy that she had done.  Hopefully, that will be out today.  We try to get records from her oncologist have been [place].  Unfortunately has been such a long time that she had seen him that his records were no longer available.  She has had no bleeding.  She is walking a little bit.  She still needs to be evaluated for rehab.  She really wants to go to rehab before she goes home.  She has had no problems with pain wise.  There is still a little bit of discomfort where she has the Port-A-Cath placed.  She has had a little bit of a cough.  There is no wheezing.  There is no productive mucus.  All of her vital signs are stable.  Oxygen saturation is 96%.  Blood pressure is 123/72.  Her lungs sound clear on the left side.  She has some decrease on the right side but good air movement.  Cardiac exam regular rate and rhythm.  Abdomen is soft.  Extremities show some trace edema in her lower legs.  For right now, I cannot think of any additional testing that needs to be done for her.  We just need to wait the path from the bronchoscopy.  I will send this off for molecular analysis.  If we have confirmation of recurrent endometrial cancer, we probably will treat her with chemotherapy and possibly throwing immunotherapy and/or antiangiogenic therapy.  I very much appreciate the wonderful care that she is getting from all the staff up on [address].  Lattie Haw, MD  Psalm 73:26

## 2019-12-24 NOTE — Progress Notes (Signed)
Report given at [place] to [place].

## 2019-12-24 NOTE — Discharge Summary (Signed)
Physician Discharge Summary  Katie [name] MVH:[id number] DOB: 1940-03-04 DOA: 12/14/2019  PCP: [name], NP  Admit date: 12/14/2019 Discharge date: 12/24/2019  Admitted From: Home Disposition:  Home  Discharge Condition:Stable CODE STATUS:DNR Diet recommendation:  Regular    Brief/Interim Summary: Patient is 79 year old female with history of endometrial cancer diagnosed about 10 years ago, status post extensive surgery/radiation in 2011, currently cancer remission, hypothyroidism, GERD, osteoporosis who presented to the [facility] emergency department for the evaluation of generalized weakness, poor oral intake, dysphagia.  She also reported unintentional weight loss, intermittent diarrhea, nonproductive cough.  On presentation, she was found to have large right-sided pleural effusion and was hypoxic.  She underwent thoracentesis.  CT chest/abdomen/pelvis showed right lower lobe mass extending into the right hilum.  Oncology recommended bronchoscopy biopsy for tissue specimen. 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    She underwent thoracentesis on the right side twice.  Today she is medically stable for discharge to skilled nursing facility.  Following problems were addressed during hospitalization:  Right lung mass with malignant effusion/endometrial cancer: CT imaging on presentation showed right lower lobe mass extending into the right hilum, moderate pleural effusion.She underwent   endobronchial ultrasound to obtain additional tissue for molecular studies.  Pathology showed adenocarcinoma Patient has endometrial  cancer diagnosed about 10 years ago.  She is status post extensive surgery/radiation 2011.  Cancer was in remission until now. Oncology following here. Pleural fluid Gram stain did not show any organisms, culture did not show any growth.  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 IR  put a Port-A-Cath for chemotherapy plan as  an outpatient.  Acute hypoxic respiratory failure: Most likely secondary to pleural effusion.  She presented with hypoxia, tachypnea.  Currently needing 2 L oxygen/min for maintenance of saturation.  She underwent ultrasound-guided left-sided thoracentesis twice.  She complains of some dry cough.  Dysphagia: Patient presented with poor oral intake, difficulty swallowing.  Underwent EGD.  Source of dysphagia not identified.  Currently on PPI.  She may need outpatient esophageal manometry.  Dysphagia suspected to be from metastatic cancer in the lung causing compression to the esophagus.  Currently she is tolerating diet.  Hypertension: Currently blood pressure is stable.On lopressor  Bilateral lower extremity edema: Does not tolerate diuretics due to hypotension.  Currently she does not have significant edema.  Normocytic anemia: Associated with malignancy.  Currently hemoglobin is stable  History of cholecystectomy/loose stools: On Cholestyramine.  Follows with GI as an outpatient  Hypothyroidism: Continue Synthyroid  Osteoporosis: On Fosamax as an outpatient  Generalized weakness: PT/OT consulted and recommended skilled nursing facility   Discharge Diagnoses:  Principal Problem:   Generalized weakness Active Problems:   Acquired hypothyroidism   Chronic diarrhea   Essential hypertension   Dysphagia   Volume depletion   Gastric polyp   Endometrial cancer (HCC)   Pleural effusion, malignant   Goals of care, counseling/discussion    Discharge Instructions  Discharge Instructions    Diet - low sodium heart healthy   Complete by: As directed    Diet general   Complete by: As directed    Discharge instructions   Complete by: As directed    1)Please take prescribed medications as instructed 2)Follow up with oncology as an outpatient.   Increase activity slowly   Complete by: As directed    No wound care   Complete by: As directed      Allergies as of  12/24/2019      Reactions  Sulfa Antibiotics Rash   Gabapentin Other (See Comments)   Depressed    Lyrica [pregabalin] Other (See Comments)   unknown   Paxil [paroxetine]    unknown      Medication List    STOP taking these medications   esomeprazole 20 MG capsule Commonly known as: [place] these medications   acetaminophen 500 MG tablet Commonly known as: TYLENOL Take 1,000 mg by mouth daily. At bedtime.   alendronate 70 MG tablet Commonly known as: FOSAMAX TAKE 1 TABLET BY MOUTH ONCE A WEEK, WITH WATER. DO NOT LIE DOWN FOR AT LEAST 30 MINUTES AND UNTIL AFTER FIRST MEAL OF THE DAY What changed: See the new instructions.   Alphagan P 0.1 % Soln Generic drug: brimonidine Place 1 drop into both eyes 2 (two) times daily. Can not take generic   CALCIUM-VITAMIN D PO Take 600 mg by mouth daily.   cholecalciferol 25 MCG (1000 UNIT) tablet Commonly known as: VITAMIN D Take 3,000 Units by mouth daily.   cholestyramine 4 g packet Commonly known as: QUESTRAN Take 4 g by mouth at bedtime.   dextromethorphan 15 MG/5ML syrup Take 5 mLs (15 mg total) by mouth 4 (four) times daily as needed for cough.   levothyroxine 75 MCG tablet Commonly known as: SYNTHROID TAKE 1 TABLET(75 MCG) BY MOUTH DAILY What changed: See the new instructions.   loratadine 10 MG tablet Commonly known as: CLARITIN Take 10 mg by mouth daily.   Lumigan 0.01 % Soln Generic drug: bimatoprost Place 1 drop into both eyes at bedtime. Can not take Generic   megestrol 40 MG tablet Commonly known as: MEGACE Take 4 tablets (160 mg total) by mouth daily. Start taking on: December 25, 2019   metoprolol tartrate 25 MG tablet Commonly known as: LOPRESSOR Take 0.5 tablets (12.5 mg total) by mouth 2 (two) times daily.   pantoprazole 40 MG tablet Commonly known as: PROTONIX Take 1 tablet (40 mg total) by mouth 2 (two) times daily.   SYSTANE ULTRA OP Apply 1 drop to eye daily as needed. Both  Eyes. For dry eyes.   vitamin B-12 1000 MCG tablet Commonly known as: CYANOCOBALAMIN Take 1,000 mcg by mouth daily.       Follow-up Information    [facility] Follow up.   Specialty: Home Health Services Contact information: [place] [address] [phone]              Allergies  Allergen Reactions  . Sulfa Antibiotics Rash  . Gabapentin Other (See Comments)    Depressed   . Lyrica [Pregabalin] Other (See Comments)    unknown  . Paxil [Paroxetine]     unknown    Consultations:  GI, oncology, pulmonology   Procedures/Studies: DG Chest 1 View  Result Date: 12/16/2019 CLINICAL DATA:  Status post right thoracentesis. EXAM: CHEST  1 VIEW COMPARISON:  December 15, 2019. FINDINGS: The heart size and mediastinal contours are within normal limits. Right pleural effusion is significantly smaller status post thoracentesis. No pneumothorax is noted. Left lung is clear. The visualized skeletal structures are unremarkable. IMPRESSION: Right pleural effusion is significantly smaller status post thoracentesis. No pneumothorax is noted. Electronically Signed   By: Marijo Conception M.D.   On: 12/16/2019 09:47   DG Chest 2 View  Result Date: 12/21/2019 CLINICAL DATA:  Shortness of breath, history of pleural effusion. EXAM: CHEST - 2 VIEW COMPARISON:  12/16/2019 and CT 12/17/2019 FINDINGS: Examination demonstrates  slight interval worsening of a moderate size right pleural effusion likely with associated right basilar atelectasis. Mild patchy density over the right mid to upper lung as seen on recent chest CT probably related to atelectasis or nodular pleural thickening. Cardiomediastinal silhouette and remainder of the exam is unchanged. IMPRESSION: 1. Slight interval worsening of a moderate size right pleural effusion likely with associated right basilar atelectasis. 2. Patchy density over the right mid to upper lung as seen on recent chest CT likely  atelectasis or nodular pleural thickening. Right lower lobe mass as seen on CT not well visualized radiographically. Electronically Signed   By: Marin Olp M.D.   On: 12/21/2019 10:02   CT CHEST ABDOMEN PELVIS W CONTRAST  Result Date: 12/17/2019 CLINICAL DATA:  79 year old female with unintentional weight loss. History of endometrial cancer. EXAM: CT CHEST, ABDOMEN, AND PELVIS WITH CONTRAST TECHNIQUE: Multidetector CT imaging of the chest, abdomen and pelvis was performed following the standard protocol during bolus administration of intravenous contrast. CONTRAST:  151m OMNIPAQUE IOHEXOL 300 MG/ML  SOLN COMPARISON:  CT abdomen pelvis dated 07/11/2017. FINDINGS: CT CHEST FINDINGS Cardiovascular: There is no cardiomegaly or pericardial effusion. There is 3 vessel coronary vascular calcification. There is calcification of the mitral annulus. There is moderate atherosclerotic calcification of the thoracic aorta. No aneurysmal dilatation or dissection. The origins of the great vessels of the aortic arch appear patent as visualized. Mediastinum/Nodes: Right hilar mass extending into the subcarinal region measuring 4.1 x 2.3 cm in greatest axial dimension and 6 cm in craniocaudal length. Several subcentimeter but mildly rounded lymph node noted in the prevascular space. The esophagus is grossly unremarkable. Lungs/Pleura: There is a masslike consolidation in the superior segment of the right lower lobe which is contiguous with the right hilar mass. The portion of the mass in the right lower lobe measures 5.2 x 2.0 cm in greatest axial dimensions (49/6). There is high-grade narrowing of the right middle and right lower lobe bronchi secondary to mass effect and compression or infiltration of the mass. There are small clusters of ground-glass opacity in the right upper lobe as well as right lower lobe. A 2.4 x 1.8 cm focal density in the right lower lobe (coronal 76/4) likely extension of tumor versus pneumonia.  There is a moderate right pleural effusion with partial compressive atelectasis of the right lower lobe. There is nodular and irregular thickening of the right upper lobe pleural surface consistent with pleural metastasis. The left lung is clear. There is no pneumothorax. Musculoskeletal: Degenerative changes of the spine. No acute osseous pathology. CT ABDOMEN PELVIS FINDINGS No intra-abdominal free air. Trace free fluid in the pelvis. Hepatobiliary: The liver is unremarkable. No intrahepatic biliary ductal dilatation. Cholecystectomy. Pancreas: Unremarkable. No pancreatic ductal dilatation or surrounding inflammatory changes. Spleen: Normal in size without focal abnormality. Adrenals/Urinary Tract: The adrenal glands unremarkable. There is no hydronephrosis on either side. There is symmetric enhancement and excretion of contrast by both kidneys. There is a 1 cm right renal interpolar cyst. The visualized ureters and urinary bladder appear unremarkable. Stomach/Bowel: There is sigmoid diverticulosis as well as diverticula of the ascending colon. Mild pericolonic stranding adjacent to the ascending colonic diverticula may be related to general mesenteric edema or mild diverticulitis. Clinical correlation is recommended. No abscess or perforation. There is a 3 cm duodenal diverticulum at the head of the pancreas as well as a 2 cm distal duodenal diverticula. There is no bowel obstruction. Vascular/Lymphatic: Advanced aortoiliac atherosclerotic disease. The IVC is unremarkable. No  portal venous gas. There is no adenopathy. There is a 5 mm gastrohepatic lymph node. Reproductive: Hysterectomy. No adnexal masses. Other: Mild subcutaneous edema. There is streak artifact caused by left hip hardware limiting evaluation of the pelvic structures. Musculoskeletal: Osteopenia with degenerative changes of the spine and scoliosis. No acute osseous pathology. Left femoral screw. IMPRESSION: 1. Right lower lobe mass extending into  the right hilum and subcarinal region most consistent with malignancy. There is high-grade narrowing of the right middle and right lower lobe bronchi secondary to mass effect and compression or infiltration of the mass. 2. Moderate right pleural effusion with partial compressive atelectasis of the right lower lobe. 3. Right upper lobe pleural thickening, likely metastasis. 4. Colonic diverticulosis. Mild pericolonic stranding adjacent to the ascending colonic diverticula may be related to general mesenteric edema or mild diverticulitis. Clinical correlation is recommended. No abscess or perforation. 5. Aortic Atherosclerosis (ICD10-I70.0). Electronically Signed   By: Anner Crete M.D.   On: 12/17/2019 19:35   DG Chest Port 1 View  Result Date: 12/23/2019 CLINICAL DATA:  79 year old female status post thoracentesis. EXAM: PORTABLE CHEST 1 VIEW COMPARISON:  Chest radiograph dated 12/22/2019 FINDINGS: Right-sided Port-A-Cath in similar position. Slight interval improvement in right lung densities and aeration of the right lung. Slight interval decrease in the right pleural effusion. No pneumothorax identified. Stable cardiomediastinal silhouette. No acute osseous pathology. IMPRESSION: Decreased right pleural effusion and improved aeration of the right lung. No pneumothorax. Electronically Signed   By: Anner Crete M.D.   On: 12/23/2019 16:16   DG CHEST PORT 1 VIEW  Result Date: 12/22/2019 CLINICAL DATA:  Status post bronchoscopy. EXAM: PORTABLE CHEST 1 VIEW COMPARISON:  Yesterday. FINDINGS: Interval right jugular porta catheter with its tip in the upper right atrium. No pneumothorax. Normal sized heart. Tortuous and partially calcified thoracic aorta. Moderately large right pleural effusion, mildly increased. Air oval mild right basilar atelectasis. Diffuse osteopenia. Right shoulder degenerative changes and fixation anchors. IMPRESSION: 1. No pneumothorax following bronchoscopy. 2. Moderately large  right pleural effusion, mildly increased. 3. Mild right basilar atelectasis. Electronically Signed   By: Claudie Revering M.D.   On: 12/22/2019 11:50   DG CHEST PORT 1 VIEW  Result Date: 12/15/2019 CLINICAL DATA:  Inability to eat, possible stricture, lack of intake, history of endometrial cancer EXAM: PORTABLE CHEST 1 VIEW COMPARISON:  Radiograph 06/27/2019 FINDINGS: New moderate to large right pleural effusion with fluid tracking across the lung apex as well. Adjacent opacity likely reflecting some passive atelectatic changes though underlying airspace disease is difficult to fully exclude. Left lung is predominantly clear. Right cardiac margin is largely obscured by opacity. Visible cardiomediastinal contours are unremarkable with a calcified aorta. The osseous structures appear diffusely demineralized which may limit detection of small or nondisplaced fractures. Degenerative changes are present in the imaged spine and shoulders. High-riding appearance of the humeral heads bilaterally compatible with sequela of rotator cuff insufficiency with evidence of prior right shoulder rotator cuff repair. IMPRESSION: 1. New moderate to large right pleural effusion with fluid tracking across the lung apex. Adjacent opacity likely reflecting some passive atelectatic changes though other underlying processes are difficult to exclude. 2.  Aortic Atherosclerosis (ICD10-I70.0). Electronically Signed   By: Lovena Le M.D.   On: 12/15/2019 00:34   ECHOCARDIOGRAM COMPLETE  Result Date: 12/22/2019    ECHOCARDIOGRAM REPORT   Patient Name:   KIYA ENO Date of Exam: 12/22/2019 Medical Rec #:  [id number]         Height:  60.0 in Accession #:    [id number]        Weight:       133.0 lb Date of Birth:  06/25/1940         BSA:          1.569 m Patient Age:    34 years          BP:           179/78 mmHg Patient Gender: F                 HR:           72 bpm. Exam Location:  Inpatient Procedure: 2D Echo, 3D Echo, Cardiac  Doppler, Color Doppler and Strain Analysis Indications:    Z51.11 Encounter for antineoplastic chemotheraphy  History:        Patient has no prior history of Echocardiogram examinations,                 most recent 06/28/2019. Cancer. Pleural Effusion. GERD.                 Hypothyroidism.  Sonographer:    Jonelle Sidle Dance Referring Phys: [place]  1. Left ventricular ejection fraction, by estimation, is 60%. The left ventricle has normal function. The left ventricle has no regional wall motion abnormalities. There is mild left ventricular hypertrophy. Left ventricular diastolic parameters are consistent with Grade I diastolic dysfunction (impaired relaxation). The average left ventricular global longitudinal strain is -13.8 %. The global longitudinal strain is abnormal. GLS has suboptimal tracking, but in a similar location to prior study, and no significant change as compared to 06/28/19.  2. Right ventricular systolic function is normal. The right ventricular size is normal. Tricuspid regurgitation signal is inadequate for assessing PA pressure.  3. The mitral valve is normal in structure. Mild mitral valve regurgitation. No evidence of mitral stenosis. Moderate mitral annular calcification.  4. The aortic valve is tricuspid. There is mild calcification of the aortic valve. Aortic valve regurgitation is not visualized. No aortic stenosis is present.  5. The inferior vena cava is normal in size with greater than 50% respiratory variability, suggesting right atrial pressure of 3 mmHg. FINDINGS  Left Ventricle: Left ventricular ejection fraction, by estimation, is 60%. The left ventricle has normal function. The left ventricle has no regional wall motion abnormalities. The average left ventricular global longitudinal strain is -13.8 %. The global longitudinal strain is abnormal. The left ventricular internal cavity size was normal in size. There is mild left ventricular hypertrophy. Left  ventricular diastolic parameters are consistent with Grade I diastolic dysfunction (impaired relaxation). Right Ventricle: The right ventricular size is normal. No increase in right ventricular wall thickness. Right ventricular systolic function is normal. Tricuspid regurgitation signal is inadequate for assessing PA pressure. Left Atrium: Left atrial size was normal in size. Right Atrium: Right atrial size was normal in size. Pericardium: There is no evidence of pericardial effusion. Mitral Valve: The mitral valve is normal in structure. Moderate mitral annular calcification. Mild mitral valve regurgitation. No evidence of mitral valve stenosis. Tricuspid Valve: The tricuspid valve is normal in structure. Tricuspid valve regurgitation is not demonstrated. No evidence of tricuspid stenosis. Aortic Valve: The aortic valve is tricuspid. There is mild calcification of the aortic valve. Aortic valve regurgitation is not visualized. No aortic stenosis is present. Pulmonic Valve: The pulmonic valve was grossly normal. Pulmonic valve regurgitation is not visualized. No evidence of pulmonic stenosis. Aorta: The  aortic root is normal in size and structure. Venous: The inferior vena cava is normal in size with greater than 50% respiratory variability, suggesting right atrial pressure of 3 mmHg. IAS/Shunts: No atrial level shunt detected by color flow Doppler.  LEFT VENTRICLE PLAX 2D LVIDd:         3.90 cm  Diastology LVIDs:         3.00 cm  LV e' medial:    0.05 cm/s LV PW:         1.00 cm  LV E/e' medial:  17.5 LV IVS:        1.00 cm  LV e' lateral:   0.05 cm/s LVOT diam:     1.80 cm  LV E/e' lateral: 16.4 LV SV:         43 LV SV Index:   27       2D Longitudinal Strain LVOT Area:     2.54 cm 2D Strain GLS Avg:     -13.8 %  RIGHT VENTRICLE             IVC RV Basal diam:  2.40 cm     IVC diam: 1.00 cm RV S prime:     10.90 cm/s TAPSE (M-mode): 1.5 cm LEFT ATRIUM             Index       RIGHT ATRIUM           Index LA diam:         3.70 cm 2.36 cm/m  RA Area:     10.50 cm LA Vol (A2C):   30.6 ml 19.50 ml/m RA Volume:   22.80 ml  14.53 ml/m LA Vol (A4C):   15.8 ml 10.07 ml/m LA Biplane Vol: 22.1 ml 14.08 ml/m  AORTIC VALVE LVOT Vmax:   83.60 cm/s LVOT Vmean:  59.100 cm/s LVOT VTI:    0.168 m  AORTA Ao Root diam: 3.10 cm Ao Asc diam:  3.00 cm MITRAL VALVE MV Area (PHT): 2.34 cm     SHUNTS MV Area VTI:   1.37 cm     Systemic VTI:  0.17 m MV VTI:        0.31 m       Systemic Diam: 1.80 cm MV Decel Time: 324 msec MV E velocity: 0.84 cm/s MV A velocity: 132.00 cm/s MV E/A ratio:  0.01 Cherlynn Kaiser MD Electronically signed by Cherlynn Kaiser MD Signature Date/Time: 12/22/2019/4:39:22 PM    Final    IR IMAGING GUIDED PORT INSERTION  Result Date: 12/22/2019 INDICATION: History of recurrent metastatic endometrial cancer. In need durable intravenous access for chemotherapy administration. EXAM: IMPLANTED PORT A CATH PLACEMENT WITH ULTRASOUND AND FLUOROSCOPIC GUIDANCE COMPARISON:  CT the chest, abdomen pelvis-12/17/2019 MEDICATIONS: Ancef 2 gm IV; The antibiotic was administered within an appropriate time interval prior to skin puncture. ANESTHESIA/SEDATION: Moderate (conscious) sedation was employed during this procedure. A total of Versed 2 mg and Fentanyl 100 mcg was administered intravenously. Moderate Sedation Time: 22 minutes. The patient's level of consciousness and vital signs were monitored continuously by radiology nursing throughout the procedure under my direct supervision. CONTRAST:  None FLUOROSCOPY TIME:  16 seconds (4 mGy) COMPLICATIONS: None immediate. PROCEDURE: The procedure, risks, benefits, and alternatives were explained to the patient. Questions regarding the procedure were encouraged and answered. The patient understands and consents to the procedure. The right neck and chest were prepped with chlorhexidine in a sterile fashion, and a sterile drape was applied covering the operative  field. Maximum barrier  sterile technique with sterile gowns and gloves were used for the procedure. A timeout was performed prior to the initiation of the procedure. Local anesthesia was provided with 1% lidocaine with epinephrine. After creating a small venotomy incision, a micropuncture kit was utilized to access the internal jugular vein. Real-time ultrasound guidance was utilized for vascular access including the acquisition of a permanent ultrasound image documenting patency of the accessed vessel. The microwire was utilized to measure appropriate catheter length. A subcutaneous port pocket was then created along the upper chest wall utilizing a combination of sharp and blunt dissection. The pocket was irrigated with sterile saline. A single lumen "Slim" sized power injectable port was chosen for placement. The 8 Fr catheter was tunneled from the port pocket site to the venotomy incision. The port was placed in the pocket. The external catheter was trimmed to appropriate length. At the venotomy, an 8 Fr peel-away sheath was placed over a guidewire under fluoroscopic guidance. The catheter was then placed through the sheath and the sheath was removed. Final catheter positioning was confirmed and documented with a fluoroscopic spot radiograph. The port was accessed with a Huber needle, aspirated and flushed with heparinized saline. The venotomy site was closed with an interrupted 4-0 Vicryl suture. The port pocket incision was closed with interrupted 2-0 Vicryl suture. The skin was opposed with a running subcuticular 4-0 Vicryl suture. Dermabond and Steri-strips were applied to both incisions. Dressings were applied. The patient tolerated the procedure well without immediate post procedural complication. FINDINGS: After catheter placement, the tip lies within the superior cavoatrial junction. The catheter aspirates and flushes normally and is ready for immediate use. IMPRESSION: Successful placement of a right internal jugular approach  power injectable Port-A-Cath. The catheter is ready for immediate use. Electronically Signed   By: Sandi Mariscal M.D.   On: 12/22/2019 08:29   US THORACENTESIS ASP PLEURAL SPACE W/IMG GUIDE  Result Date: 12/23/2019 INDICATION: Patient with history of endometrial cancer, presents with lung mass and pleural effusion. Request is made for therapeutic thoracentesis. EXAM: ULTRASOUND GUIDED THERAPEUTIC RIGHT THORACENTESIS MEDICATIONS: 10 mL 1% lidocaine COMPLICATIONS: None immediate. PROCEDURE: An ultrasound guided thoracentesis was thoroughly discussed with the patient and questions answered. The benefits, risks, alternatives and complications were also discussed. The patient understands and wishes to proceed with the procedure. Written consent was obtained. Ultrasound was performed to localize and mark an adequate pocket of fluid in the right chest. The area was then prepped and draped in the normal sterile fashion. 1% Lidocaine was used for local anesthesia. Under ultrasound guidance a 6 Fr Safe-T-Centesis catheter was introduced. Thoracentesis was performed. The catheter was removed and a dressing applied. FINDINGS: A total of approximately 400 mL of blood-tinged fluid was removed. Samples were sent to the laboratory as requested by the clinical team. Not all of the fluid was able to be removed due to the presence of suspect adhesions. IMPRESSION: Successful ultrasound guided right thoracentesis yielding 400 mL of pleural fluid. Read by: Brynda Greathouse PA-C Electronically Signed   By: Aletta Edouard M.D.   On: 12/23/2019 15:44   US THORACENTESIS ASP PLEURAL SPACE W/IMG GUIDE  Result Date: 12/16/2019 INDICATION: Patient with remote history of endometrial cancer, weakness, dysphagia, anemia, right pleural effusion. Request received for diagnostic and therapeutic right thoracentesis. EXAM: ULTRASOUND GUIDED DIAGNOSTIC AND THERAPEUTIC RIGHT THORACENTESIS MEDICATIONS: 1% lidocaine to skin and subcutaneous tissue  COMPLICATIONS: None immediate. PROCEDURE: An ultrasound guided thoracentesis was thoroughly discussed with the patient and  questions answered. The benefits, risks, alternatives and complications were also discussed. The patient understands and wishes to proceed with the procedure. Written consent was obtained. Ultrasound was performed to localize and mark an adequate pocket of fluid in the right chest. The area was then prepped and draped in the normal sterile fashion. 1% Lidocaine was used for local anesthesia. Under ultrasound guidance a 6 Fr Safe-T-Centesis catheter was introduced. Thoracentesis was performed. The catheter was removed and a dressing applied. FINDINGS: A total of approximately 1.7 liters of bloody fluid was removed. Samples were sent to the laboratory as requested by the clinical team. Primary MD notified of above. IMPRESSION: Successful ultrasound guided diagnostic and therapeutic right thoracentesis yielding 1.7 liters of pleural fluid. Read by: Rowe Robert, PA-C Electronically Signed   By: Sandi Mariscal M.D.   On: 12/16/2019 11:28       Subjective:  Patient seen and examined at the bedside this morning.  Hemodynamically stable for discharge to skilled nursing facility today. Discharge Exam: Vitals:   12/23/19 1937 12/24/19 0326  BP: 117/63 123/72  Pulse: 92 68  Resp: 16 16  Temp: 97.9 F (36.6 C) 97.9 F (36.6 C)  SpO2: 92% 96%   Vitals:   12/23/19 1520 12/23/19 1535 12/23/19 1937 12/24/19 0326  BP: 136/74 130/65 117/63 123/72  Pulse:   92 68  Resp:   16 16  Temp:   97.9 F (36.6 C) 97.9 F (36.6 C)  TempSrc:   Oral Oral  SpO2: 100% 100% 92% 96%  Weight:      Height:        General: Pt is alert, awake, not in acute distress Cardiovascular: RRR, S1/S2 +, no rubs, no gallops, Port-A-Cath on the right chest Respiratory: Mild diminished air sounds on the right side  abdominal: Soft, NT, ND, bowel sounds + Extremities: Bilateral lower extremity nonpitting edema,  no cyanosis    The results of significant diagnostics from this hospitalization (including imaging, microbiology, ancillary and laboratory) are listed below for reference.     Microbiology: Recent Results (from the past 240 hour(s))  Respiratory Panel by RT PCR (Flu A&B, Covid) - Nasopharyngeal Swab     Status: None   Collection Time: 12/14/19 10:39 PM   Specimen: Nasopharyngeal Swab  Result Value Ref Range Status   SARS Coronavirus 2 by RT PCR NEGATIVE NEGATIVE Final    Comment: (NOTE) SARS-CoV-2 target nucleic acids are NOT DETECTED.  The SARS-CoV-2 RNA is generally detectable in upper respiratoy specimens during the acute phase of infection. The lowest concentration of SARS-CoV-2 viral copies this assay can detect is 131 copies/mL. A negative result does not preclude SARS-Cov-2 infection and should not be used as the sole basis for treatment or other patient management decisions. A negative result may occur with  improper specimen collection/handling, submission of specimen other than nasopharyngeal swab, presence of viral mutation(s) within the areas targeted by this assay, and inadequate number of viral copies (<131 copies/mL). A negative result must be combined with clinical observations, patient history, and epidemiological information. The expected result is Negative.  Fact Sheet for Patients:  PinkCheek.be  Fact Sheet for Healthcare Providers:  GravelBags.it  This test is no t yet approved or cleared by the [place] FDA and  has been authorized for detection and/or diagnosis of SARS-CoV-2 by FDA under an Emergency Use Authorization (EUA). This EUA will remain  in effect (meaning this test can be used) for the duration of the COVID-19 declaration under Section 564(b)(1) of  the Act, 21 U.S.C. section 360bbb-3(b)(1), unless the authorization is terminated or revoked sooner.     Influenza A by PCR NEGATIVE  NEGATIVE Final   Influenza B by PCR NEGATIVE NEGATIVE Final    Comment: (NOTE) The Xpert Xpress SARS-CoV-2/FLU/RSV assay is intended as an aid in  the diagnosis of influenza from Nasopharyngeal swab specimens and  should not be used as a sole basis for treatment. Nasal washings and  aspirates are unacceptable for Xpert Xpress SARS-CoV-2/FLU/RSV  testing.  Fact Sheet for Patients: PinkCheek.be  Fact Sheet for Healthcare Providers: GravelBags.it  This test is not yet approved or cleared by the [place] FDA and  has been authorized for detection and/or diagnosis of SARS-CoV-2 by  FDA under an Emergency Use Authorization (EUA). This EUA will remain  in effect (meaning this test can be used) for the duration of the  Covid-19 declaration under Section 564(b)(1) of the Act, 21  U.S.C. section 360bbb-3(b)(1), unless the authorization is  terminated or revoked. Performed at [facility] [place] [address], [place], [place] [address]   Body fluid culture     Status: None   Collection Time: 12/16/19  9:37 AM   Specimen: PATH Cytology Misc. fluid; Pleural Fluid  Result Value Ref Range Status   Specimen Description   Final    PLEURAL Performed at [facility] [address], [place], [place] [address]    Special Requests   Final    NONE Performed at [facility], [place] [address], [place], [place] [address]    Gram Stain   Final    RARE WBC PRESENT, PREDOMINANTLY MONONUCLEAR NO ORGANISMS SEEN    Culture   Final    NO GROWTH Performed at [facility], [place] [address] [place],  [address]    Report Status 12/19/2019 FINAL  Final     Labs: BNP (last 3 results) No results for input(s): BNP in the last 8760 hours. Basic Metabolic Panel: Recent Labs  Lab 12/19/19 0746 12/20/19 0632 12/21/19 0516  NA 131* 133* 132*  K 3.7 3.6 4.3  CL 97* 99 99  CO2 _0 GLUCOSE 104* 99 95  BUN _1 CREATININE 0.74 0.64 0.62  CALCIUM 8.2* 8.4* 8.4*  MG 2.0 1.9 1.9   Liver Function Tests: No results for input(s): AST, ALT, ALKPHOS, BILITOT, PROT, ALBUMIN in the last 168 hours. No results for input(s): LIPASE, AMYLASE in the last 168 hours. No results for input(s): AMMONIA in the last 168 hours. CBC: Recent Labs  Lab 12/20/19 0632  WBC 5.6  NEUTROABS 3.4  HGB 10.4*  HCT 32.9*  MCV 89.6  PLT 435*   Cardiac Enzymes: No results for input(s): CKTOTAL, CKMB, CKMBINDEX, TROPONINI in the last 168 hours. BNP: Invalid input(s): POCBNP CBG: No results for input(s): GLUCAP in the last 168 hours. D-Dimer No results for input(s): DDIMER in the last 72 hours. Hgb A1c No results for input(s): HGBA1C in the last 72 hours. Lipid Profile No results for input(s): CHOL, HDL, LDLCALC, TRIG, CHOLHDL, LDLDIRECT in the last 72 hours. Thyroid function studies No results for input(s): TSH, T4TOTAL, T3FREE, THYROIDAB in the last 72 hours.  Invalid input(s): FREET3 Anemia work up No results for input(s): VITAMINB12, FOLATE, FERRITIN, TIBC, IRON, RETICCTPCT in the last 72 hours. Urinalysis    Component Value Date/Time   COLORURINE YELLOW 12/14/2019 1920   APPEARANCEUR HAZY (A) 12/14/2019 1920   LABSPEC 1.026 12/14/2019  1920   PHURINE 5.0 12/14/2019 1920   GLUCOSEU NEGATIVE 12/14/2019 1920   GLUCOSEU NEGATIVE 03/17/2018 [place] 12/14/2019 1920   BILIRUBINUR NEGATIVE 12/14/2019 1920   BILIRUBINUR Negative 11/19/2019 1413   KETONESUR 80 (A) 12/14/2019 1920   PROTEINUR 30 (A) 12/14/2019 1920   UROBILINOGEN negative (A) 11/19/2019 1413   UROBILINOGEN 0.2 03/17/2018 1149   NITRITE NEGATIVE 12/14/2019 1920   LEUKOCYTESUR TRACE (A) 12/14/2019 1920   Sepsis Labs Invalid input(s): PROCALCITONIN,  WBC,  LACTICIDVEN Microbiology Recent Results (from the past 240 hour(s))  Respiratory Panel by RT PCR (Flu A&B, Covid) - Nasopharyngeal Swab      Status: None   Collection Time: 12/14/19 10:39 PM   Specimen: Nasopharyngeal Swab  Result Value Ref Range Status   SARS Coronavirus 2 by RT PCR NEGATIVE NEGATIVE Final    Comment: (NOTE) SARS-CoV-2 target nucleic acids are NOT DETECTED.  The SARS-CoV-2 RNA is generally detectable in upper respiratoy specimens during the acute phase of infection. The lowest concentration of SARS-CoV-2 viral copies this assay can detect is 131 copies/mL. A negative result does not preclude SARS-Cov-2 infection and should not be used as the sole basis for treatment or other patient management decisions. A negative result may occur with  improper specimen collection/handling, submission of specimen other than nasopharyngeal swab, presence of viral mutation(s) within the areas targeted by this assay, and inadequate number of viral copies (<131 copies/mL). A negative result must be combined with clinical observations, patient history, and epidemiological information. The expected result is Negative.  Fact Sheet for Patients:  PinkCheek.be  Fact Sheet for Healthcare Providers:  GravelBags.it  This test is no t yet approved or cleared by the [place] FDA and  has been authorized for detection and/or diagnosis of SARS-CoV-2 by FDA under an Emergency Use Authorization (EUA). This EUA will remain  in effect (meaning this test can be used) for the duration of the COVID-19 declaration under Section 564(b)(1) of the Act, 21 U.S.C. section 360bbb-3(b)(1), unless the authorization is terminated or revoked sooner.     Influenza A by PCR NEGATIVE NEGATIVE Final   Influenza B by PCR NEGATIVE NEGATIVE Final    Comment: (NOTE) The Xpert Xpress SARS-CoV-2/FLU/RSV assay is intended as an aid in  the diagnosis of influenza from Nasopharyngeal swab specimens and  should not be used as a sole basis for treatment. Nasal washings and  aspirates are  unacceptable for Xpert Xpress SARS-CoV-2/FLU/RSV  testing.  Fact Sheet for Patients: PinkCheek.be  Fact Sheet for Healthcare Providers: GravelBags.it  This test is not yet approved or cleared by the [place] FDA and  has been authorized for detection and/or diagnosis of SARS-CoV-2 by  FDA under an Emergency Use Authorization (EUA). This EUA will remain  in effect (meaning this test can be used) for the duration of the  Covid-19 declaration under Section 564(b)(1) of the Act, 21  U.S.C. section 360bbb-3(b)(1), unless the authorization is  terminated or revoked. Performed at [facility], [place] [address], [place], [place] [address]   Body fluid culture     Status: None   Collection Time: 12/16/19  9:37 AM   Specimen: PATH Cytology Misc. fluid; Pleural Fluid  Result Value Ref Range Status   Specimen Description   Final    PLEURAL Performed at [place] [address] [place], [place] [address]    Special Requests   Final    NONE Performed at [facility],  [place] [address], [place], [place] [address]    Gram Stain   Final    RARE WBC PRESENT, PREDOMINANTLY MONONUCLEAR NO ORGANISMS SEEN    Culture   Final    NO GROWTH Performed at [facility], [place] [address] [place], [place] [address]    Report Status 12/19/2019 FINAL  Final    Please note: You were cared for by a hospitalist during your hospital stay. Once you are discharged, your primary care physician will handle any further medical issues. Please note that NO REFILLS for any discharge medications will be authorized once you are discharged, as it is imperative that you return to your primary care physician (or establish a relationship with a primary care physician if you do not have one) for your post hospital discharge needs so that they can reassess your need for medications and monitor your lab  values.    Time coordinating discharge: 40 minutes  SIGNED:   Shelly Coss, MD  [facility] 12/24/2019, 11:16 AM Pager [phone]  If 7PM-7AM, please contact night-coverage [email or web address] Password [email or web address]

## 2019-12-24 NOTE — Progress Notes (Signed)
Per Dr Marin Olp, request sent for oncotype MAP testing on specimen [id number] DOS 12/22/2019.

## 2019-12-24 NOTE — Progress Notes (Signed)
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 There is sufficient cellularity in the both cell blocks for additional  testing.   We will sign off at this point. Please call with any questions  Marshell Garfinkel MD [facility] Please see [email or web address] for pager details.  12/24/2019, 8:28 AM

## 2019-12-24 NOTE — TOC Transition Note (Signed)
Transition of Care [facility]) - CM/SW Discharge Note   Patient Details  Name: Katie [name] MRN: [id number] Date of Birth: [birth year]  [facility] CM/SW Contact:  Lynnell Catalan, RN Phone Number: 12/24/2019, 12:43 PM   Clinical Narrative:    SNF bed offers were given to both pt and daughter [name]. [facility] was chosen. She is going to [place] today rm # [phone]. RN to call [phone] for report.  [facility] scheduled to come at 1:30 for transport to facility. Yellow DNR on chart.     Final next level of care: Skilled Nursing Facility Barriers to Discharge: No Barriers Identified   Patient Goals and CMS Choice Patient states their goals for this hospitalization and ongoing recovery are:: to walk better CMS Medicare.gov Compare Post Acute Care list provided to:: Patient Represenative (must comment) Choice offered to / list presented to : Patient, [facility] POA / Guardian  Discharge Placement              Patient chooses bed at: Other - please specify in the comment section below: ([facility]) Patient to be transferred to facility by: [place] Name of family member notified: [name] Patient and family notified of of transfer: 12/24/19  Discharge Plan and Services   Discharge Planning Services: [place] Acute Care Choice: [place]

## 2019-12-24 NOTE — Care Management Important Message (Signed)
Important Message  Patient Details IM Letter given to the Patient Name: Katie [name] MRN: [id number] Date of Birth: [birth year]   Medicare Important Message Given:  Yes     Kerin Salen 12/24/2019, 10:16 AM

## 2019-12-25 ENCOUNTER — Encounter: Payer: Self-pay | Admitting: *Deleted

## 2019-12-25 ENCOUNTER — Telehealth: Payer: Self-pay

## 2019-12-25 NOTE — Telephone Encounter (Signed)
Yes she will have to have a face to face visit for these orders, hopefully the nursing facility can write discharge orders and provide this until she can get in to see [place]. There is appts today available with Amy or Dinah and a mychart (video visit) counts for face to face.

## 2019-12-25 NOTE — Progress Notes (Signed)
Reached out to [name] to introduce myself as the office RN Navigator and explain our new patient process. Reviewed the reason for their referral and scheduled their new patient appointment along with labs. Provided address and directions to the office including call back phone number. Reviewed with patient any concerns they may have or any possible barriers to attending their appointment.   Informed patient about my role as a navigator and that I will meet with them prior to their New Patient appointment and more fully discuss what services I can provide. At this time patient has no further questions or needs.   Oncology Nurse Navigator Documentation  Oncology Nurse Navigator Flowsheets 12/25/2019  Abnormal Finding Date 12/15/2019  Confirmed Diagnosis Date 12/22/2019  Navigator Follow Up Date: 12/30/2019  Navigator Follow Up Reason: New Patient Appointment  Navigator Location [facility]  Navigator Encounter Type Introductory Phone Call  Patient Visit Type MedOnc  Treatment Phase Pre-Tx/Tx Discussion  Barriers/Navigation Needs Coordination of Care;Education  Education Other  Interventions Coordination of Care;Education  Acuity Level 2-Minimal Needs (1-2 Barriers Identified)  Coordination of Care Appts  Education Method Verbal  Time Spent with Patient 32

## 2019-12-25 NOTE — Telephone Encounter (Signed)
Patient called requesting order for Home health, PT, OT speech therapy nad nursing assistance at home.Patient is in rehab and daughter is coming to pick her up to take her home because it tool 15 minutes for anyone to answer the call bell in the middle of the night lastnight when she needed to use the bathroom. Told patient she would need to make an appointment to have these orders placed and she requested that you call and speak with her daughter Norva Riffle who is also a Designer, jewellery.  [phone]. Please advise.

## 2019-12-25 NOTE — Telephone Encounter (Signed)
I called and spoke with [name] she stated that she had spoken with [name] (administrative staff [facility]) and she informed her it would be easier if she got order before leaving facility. [name] said the facility sent orders to [facility]. Message routed back to Sherrie Mustache, NP

## 2019-12-28 ENCOUNTER — Ambulatory Visit: Payer: Medicare Other

## 2019-12-28 ENCOUNTER — Other Ambulatory Visit: Payer: Self-pay | Admitting: *Deleted

## 2019-12-28 DIAGNOSIS — J90 Pleural effusion, not elsewhere classified: Secondary | ICD-10-CM

## 2019-12-28 MED ORDER — MEGESTROL ACETATE 40 MG PO TABS: 160.0000 mg | ORAL_TABLET | Freq: Every day | ORAL | 1 refills | Status: DC

## 2019-12-30 ENCOUNTER — Other Ambulatory Visit: Payer: Self-pay | Admitting: *Deleted

## 2019-12-30 ENCOUNTER — Inpatient Hospital Stay (HOSPITAL_BASED_OUTPATIENT_CLINIC_OR_DEPARTMENT_OTHER): Payer: Medicare Other | Admitting: Hematology & Oncology

## 2019-12-30 ENCOUNTER — Inpatient Hospital Stay: Payer: Medicare Other | Attending: Hematology & Oncology

## 2019-12-30 ENCOUNTER — Inpatient Hospital Stay: Payer: Medicare Other

## 2019-12-30 ENCOUNTER — Other Ambulatory Visit: Payer: Self-pay

## 2019-12-30 ENCOUNTER — Encounter: Payer: Self-pay | Admitting: Hematology & Oncology

## 2019-12-30 VITALS — BP 160/80 | HR 78 | Temp 97.7°F | Resp 20 | Ht 60.0 in | Wt 128.1 lb

## 2019-12-30 DIAGNOSIS — M7989 Other specified soft tissue disorders: Secondary | ICD-10-CM | POA: Insufficient documentation

## 2019-12-30 DIAGNOSIS — C541 Malignant neoplasm of endometrium: Secondary | ICD-10-CM | POA: Insufficient documentation

## 2019-12-30 DIAGNOSIS — J910 Malignant pleural effusion: Secondary | ICD-10-CM | POA: Insufficient documentation

## 2019-12-30 DIAGNOSIS — Z79899 Other long term (current) drug therapy: Secondary | ICD-10-CM | POA: Insufficient documentation

## 2019-12-30 DIAGNOSIS — C7800 Secondary malignant neoplasm of unspecified lung: Secondary | ICD-10-CM | POA: Diagnosis not present

## 2019-12-30 DIAGNOSIS — Z95828 Presence of other vascular implants and grafts: Secondary | ICD-10-CM

## 2019-12-30 LAB — IRON AND TIBC
Iron: 71 ug/dL (ref 41–142)
Saturation Ratios: 24 % (ref 21–57)
TIBC: 296 ug/dL (ref 236–444)
UIBC: 225 ug/dL (ref 120–384)

## 2019-12-30 LAB — CMP (CANCER CENTER ONLY)
ALT: 10 U/L (ref 0–44)
AST: 12 U/L — ABNORMAL LOW (ref 15–41)
Albumin: 3.4 g/dL — ABNORMAL LOW (ref 3.5–5.0)
Alkaline Phosphatase: 34 U/L — ABNORMAL LOW (ref 38–126)
Anion gap: 8 (ref 5–15)
BUN: 12 mg/dL (ref 8–23)
CO2: 22 mmol/L (ref 22–32)
Calcium: 9.6 mg/dL (ref 8.9–10.3)
Chloride: 98 mmol/L (ref 98–111)
Creatinine: 0.59 mg/dL (ref 0.44–1.00)
GFR, Estimated: >60 mL/min (ref >60)
Glucose, Bld: 97 mg/dL (ref 70–99)
Potassium: 3.8 mmol/L (ref 3.5–5.1)
Sodium: 128 mmol/L — ABNORMAL LOW (ref 135–145)
Total Bilirubin: 0.3 mg/dL (ref 0.3–1.2)
Total Protein: 6.6 g/dL (ref 6.5–8.1)

## 2019-12-30 LAB — CBC WITH DIFFERENTIAL (CANCER CENTER ONLY)
Abs Immature Granulocytes: 0.12 10*3/uL — ABNORMAL HIGH (ref 0.00–0.07)
Basophils Absolute: 0 10*3/uL (ref 0.0–0.1)
Basophils Relative: 1 %
Eosinophils Absolute: 0.3 10*3/uL (ref 0.0–0.5)
Eosinophils Relative: 4 %
HCT: 33.9 % — ABNORMAL LOW (ref 36.0–46.0)
Hemoglobin: 10.8 g/dL — ABNORMAL LOW (ref 12.0–15.0)
Immature Granulocytes: 2 %
Lymphocytes Relative: 12 %
Lymphs Abs: 0.9 10*3/uL (ref 0.7–4.0)
MCH: 28.2 pg (ref 26.0–34.0)
MCHC: 31.9 g/dL (ref 30.0–36.0)
MCV: 88.5 fL (ref 80.0–100.0)
Monocytes Absolute: 0.8 10*3/uL (ref 0.1–1.0)
Monocytes Relative: 11 %
Neutro Abs: 5.2 10*3/uL (ref 1.7–7.7)
Neutrophils Relative %: 70 %
Platelet Count: 368 10*3/uL (ref 150–400)
RBC: 3.83 MIL/uL — ABNORMAL LOW (ref 3.87–5.11)
RDW: 14.2 % (ref 11.5–15.5)
WBC Count: 7.4 10*3/uL (ref 4.0–10.5)
nRBC: 0 % (ref 0.0–0.2)

## 2019-12-30 LAB — RETICULOCYTES
Immature Retic Fract: 14.3 % (ref 2.3–15.9)
RBC.: 3.86 MIL/uL — ABNORMAL LOW (ref 3.87–5.11)
Retic Count, Absolute: 104.6 10*3/uL (ref 19.0–186.0)
Retic Ct Pct: 2.7 % (ref 0.4–3.1)

## 2019-12-30 LAB — LACTATE DEHYDROGENASE: LDH: 173 U/L (ref 98–192)

## 2019-12-30 LAB — PREALBUMIN: Prealbumin: 18.3 mg/dL (ref 18–38)

## 2019-12-30 MED ORDER — LIDOCAINE-PRILOCAINE 2.5-2.5 % EX CREA: 1.0000 "application " | TOPICAL_CREAM | CUTANEOUS | 0 refills | Status: DC | PRN

## 2019-12-30 MED ORDER — HEPARIN SOD (PORK) LOCK FLUSH 100 UNIT/ML IV SOLN
500.0000 [IU] | Freq: Once | INTRAVENOUS | Status: AC
  Administered 2019-12-30: 500 [IU] via INTRAVENOUS
  Filled 2019-12-30: qty 5

## 2019-12-30 MED ORDER — SODIUM CHLORIDE 0.9% FLUSH
10.0000 mL | Freq: Once | INTRAVENOUS | Status: AC
  Administered 2019-12-30: 10 mL via INTRAVENOUS
  Filled 2019-12-30: qty 10

## 2019-12-31 NOTE — Progress Notes (Signed)
Hematology and Oncology Follow Up Visit  Katie [name] [id number] 1940/11/21 79 y.o. 12/31/2019   Principle Diagnosis:   Metastatic endometrial cancer-pulmonary metastasis and malignant right pleural effusion  Current Therapy:    Megace 160 mg p.o. daily     Interim History:  Katie [name] is back for her first office visit.  I saw her in consultation at [facility].  She came in with a right pleural effusion.  She had a lung mass and nodules.  Ultimately she was found to have metastatic endometrial cancer.  She has a history of endometrial cancer 11 years ago up in [place].  She says she was treated with radiation and chemotherapy.  We will try to get records from [place].  Hopefully, we will be able to get something.  She had a second thoracentesis in the hospital before she was discharged.  She currently is at home.  Her daughter was with her.  Her daughter is a Designer, jewellery so she really can help out.  We did send off the specimen for molecular markers.  Maybe, we will find that there is a targeted therapy we can use or even immunotherapy.  Have her on Megace right now.  I think this is helpful.  The tumor is estrogen positive.  She is eating better.  I am sure the Megace is probably helping this.  She has a little bit of swelling in the legs.  Again, I am sure the Megace is probably part of this.  She has had no problems with nausea or vomiting.  Typically, when the pleural fluid comes back, she has a hard time swallowing.  She has had no bleeding.  There has been no diarrhea.  She has had no headache.  Currently, I would say her performance status is by ECOG 2.  Medications:  Current Outpatient Medications:  .  acetaminophen (TYLENOL) 500 MG tablet, Take 1,000 mg by mouth daily. At bedtime. , Disp: , Rfl:  .  bimatoprost (LUMIGAN) 0.01 % SOLN, Place 1 drop into both eyes at bedtime. Can not take Generic, Disp: , Rfl:  .  brimonidine (ALPHAGAN P) 0.1  % SOLN, Place 1 drop into both eyes 2 (two) times daily. Can not take generic, Disp: , Rfl:  .  cholecalciferol (VITAMIN D) 25 MCG (1000 UNIT) tablet, Take 3,000 Units by mouth daily. , Disp: , Rfl:  .  cholestyramine (QUESTRAN) 4 g packet, Take 4 g by mouth at bedtime., Disp: , Rfl:  .  levothyroxine (SYNTHROID) 75 MCG tablet, TAKE 1 TABLET(75 MCG) BY MOUTH DAILY (Patient taking differently: Take 75 mcg by mouth daily before breakfast. ), Disp: 90 tablet, Rfl: 3 .  loratadine (CLARITIN) 10 MG tablet, Take 10 mg by mouth daily., Disp: , Rfl:  .  megestrol (MEGACE) 40 MG tablet, Take 4 tablets (160 mg total) by mouth daily., Disp: 120 tablet, Rfl: 1 .  metoprolol tartrate (LOPRESSOR) 25 MG tablet, Take 0.5 tablets (12.5 mg total) by mouth 2 (two) times daily., Disp: , Rfl:  .  vitamin B-12 (CYANOCOBALAMIN) 1000 MCG tablet, Take 1,000 mcg by mouth daily., Disp: , Rfl:  .  alendronate (FOSAMAX) 70 MG tablet, TAKE 1 TABLET BY MOUTH ONCE A WEEK, WITH WATER. DO NOT LIE DOWN FOR AT LEAST 30 MINUTES AND UNTIL AFTER FIRST MEAL OF THE DAY (Patient taking differently: Take 70 mg by mouth once a week. Take on Sundays), Disp: 12 tablet, Rfl: 1 .  CALCIUM-VITAMIN D PO, Take 600  mg by mouth daily.  (Patient not taking: Reported on 12/30/2019), Disp: , Rfl:  .  dextromethorphan 15 MG/5ML syrup, Take 5 mLs (15 mg total) by mouth 4 (four) times daily as needed for cough., Disp: 120 mL, Rfl: 0 .  lidocaine-prilocaine (EMLA) cream, Apply 1 application topically as needed., Disp: 30 g, Rfl: 0 .  pantoprazole (PROTONIX) 40 MG tablet, Take 1 tablet (40 mg total) by mouth 2 (two) times daily., Disp: , Rfl:   Allergies:  Allergies  Allergen Reactions  . Sulfa Antibiotics Rash  . Gabapentin Other (See Comments)    Depressed   . Lyrica [Pregabalin] Other (See Comments)    unknown  . Paxil [Paroxetine]     unknown    Past Medical History, Surgical history, Social history, and Family History were reviewed and  updated.  Review of Systems: Review of Systems  Constitutional: Positive for fatigue and unexpected weight change.  HENT:   Positive for trouble swallowing.   Eyes: Negative.   Respiratory: Positive for chest tightness and shortness of breath.   Cardiovascular: Negative.   Gastrointestinal: Negative.   Endocrine: Negative.   Genitourinary: Negative.    Musculoskeletal: Negative.   Skin: Negative.   Neurological: Negative.   Hematological: Negative.   Psychiatric/Behavioral: Negative.     Physical Exam:  height is 5' (1.524 m) and weight is 128 lb 1.3 oz (58.1 kg). Her oral temperature is 97.7 F (36.5 C). Her blood pressure is 160/80 (abnormal) and her pulse is 78. Her respiration is 20 and oxygen saturation is 95%.   Wt Readings from Last 3 Encounters:  12/30/19 128 lb 1.3 oz (58.1 kg)  12/22/19 133 lb (60.3 kg)  12/14/19 133 lb (60.3 kg)    Physical Exam Vitals reviewed.  HENT:     Head: Normocephalic and atraumatic.  Eyes:     Pupils: Pupils are equal, round, and reactive to light.  Cardiovascular:     Rate and Rhythm: Normal rate and regular rhythm.     Heart sounds: Normal heart sounds.  Pulmonary:     Effort: Pulmonary effort is normal.     Breath sounds: Normal breath sounds.  Abdominal:     General: Bowel sounds are normal.     Palpations: Abdomen is soft.  Musculoskeletal:        General: No tenderness or deformity. Normal range of motion.     Cervical back: Normal range of motion.  Lymphadenopathy:     Cervical: No cervical adenopathy.  Skin:    General: Skin is warm and dry.     Findings: No erythema or rash.  Neurological:     Mental Status: She is alert and oriented to person, place, and time.  Psychiatric:        Behavior: Behavior normal.        Thought Content: Thought content normal.        Judgment: Judgment normal.      Lab Results  Component Value Date   WBC 7.4 12/30/2019   HGB 10.8 (L) 12/30/2019   HCT 33.9 (L) 12/30/2019   MCV  88.5 12/30/2019   PLT 368 12/30/2019     Chemistry      Component Value Date/Time   NA 128 (L) 12/30/2019 1050   K 3.8 12/30/2019 1050   CL 98 12/30/2019 1050   CO2 22 12/30/2019 1050   BUN 12 12/30/2019 1050   CREATININE 0.59 12/30/2019 1050   CREATININE 0.82 11/19/2019 1414      Component  Value Date/Time   CALCIUM 9.6 12/30/2019 1050   ALKPHOS 34 (L) 12/30/2019 1050   AST 12 (L) 12/30/2019 1050   ALT 10 12/30/2019 1050   BILITOT 0.3 12/30/2019 1050      Impression and Plan: Katie [name] is a very charming 79 year old white female.  She has a lot of spunk.  She really is motivated to try to get better.  She wants to be aggressive with therapy.  Hopefully, we can be aggressive with therapy.  I want to make sure that we have the right therapy for her however.  This is where the molecular studies come into play.  I suspect that we probably would just be stuck with using chemotherapy and possibly Avastin.  Again, we are dealing with quality of life issues.  Whenever we do, I does want her quality of life to be better.  Hopefully, we will get the molecular markers back next week.  We will then be able to get her back in and come up with a "game plan.".  I am just happy that she is home.  I know she is incredibly motivated.  I know she will do all she can.  We need to really get her nutritional state up.  Hopefully she will continue to eat well.  I will plan to get her back in once we get the molecular markers.  We will see we can use immunotherapy along with chemotherapy.   Volanda Napoleon, MD 11/25/20217:26 AM

## 2020-01-01 LAB — FERRITIN: Ferritin: 1882 ng/mL — ABNORMAL HIGH (ref 11–307)

## 2020-01-02 ENCOUNTER — Other Ambulatory Visit: Payer: Self-pay | Admitting: Nurse Practitioner

## 2020-01-02 DIAGNOSIS — F320 Major depressive disorder, single episode, mild: Secondary | ICD-10-CM

## 2020-01-02 DIAGNOSIS — R630 Anorexia: Secondary | ICD-10-CM

## 2020-01-04 ENCOUNTER — Encounter: Payer: Self-pay | Admitting: *Deleted

## 2020-01-04 NOTE — Progress Notes (Signed)
Oncology Nurse Navigator Documentation  Oncology Nurse Navigator Flowsheets 01/04/2020  Abnormal Finding Date -  Confirmed Diagnosis Date -  Navigator Follow Up Date: 01/06/2020  Navigator Follow Up Reason: Pathology  Navigator Location [facility]  Navigator Encounter Type Appt/Treatment Plan Review  Patient Visit Type MedOnc  Treatment Phase Pre-Tx/Tx Discussion  Barriers/Navigation Needs Coordination of Care;Education  Education -  Interventions None Required  Acuity Level 2-Minimal Needs (1-2 Barriers Identified)  Coordination of Care -  Education Method -  Support Groups/Services Friends and Family  Time Spent with Patient 15

## 2020-01-04 NOTE — Progress Notes (Signed)
ETA for OncoType MAP results 01/07/2020. Patient follow up will be based on these results.  Oncology Nurse Navigator Documentation  Oncology Nurse Navigator Flowsheets 01/04/2020  Abnormal Finding Date -  Confirmed Diagnosis Date -  Navigator Follow Up Date: 01/07/2020  Navigator Follow Up Reason: Pathology  Navigator Location [facility]  Navigator Encounter Type Appt/Treatment Plan Review  Patient Visit Type MedOnc  Treatment Phase Pre-Tx/Tx Discussion  Barriers/Navigation Needs Coordination of Care;Education  Education -  Interventions None Required  Acuity Level 2-Minimal Needs (1-2 Barriers Identified)  Coordination of Care -  Education Method -  Support Groups/Services Friends and Family  Time Spent with Patient 15

## 2020-01-08 ENCOUNTER — Telehealth: Payer: Self-pay

## 2020-01-08 ENCOUNTER — Encounter: Payer: Self-pay | Admitting: *Deleted

## 2020-01-08 ENCOUNTER — Other Ambulatory Visit: Payer: Self-pay

## 2020-01-08 ENCOUNTER — Encounter: Payer: Self-pay | Admitting: Nurse Practitioner

## 2020-01-08 ENCOUNTER — Ambulatory Visit (INDEPENDENT_AMBULATORY_CARE_PROVIDER_SITE_OTHER): Payer: Medicare Other | Admitting: Nurse Practitioner

## 2020-01-08 VITALS — BP 123/70 | HR 81 | Temp 97.9°F | Ht 60.0 in | Wt 130.8 lb

## 2020-01-08 DIAGNOSIS — J9601 Acute respiratory failure with hypoxia: Secondary | ICD-10-CM

## 2020-01-08 DIAGNOSIS — R1310 Dysphagia, unspecified: Secondary | ICD-10-CM

## 2020-01-08 DIAGNOSIS — E870 Hyperosmolality and hypernatremia: Secondary | ICD-10-CM

## 2020-01-08 DIAGNOSIS — C7801 Secondary malignant neoplasm of right lung: Secondary | ICD-10-CM

## 2020-01-08 DIAGNOSIS — R627 Adult failure to thrive: Secondary | ICD-10-CM | POA: Diagnosis not present

## 2020-01-08 DIAGNOSIS — I700 Atherosclerosis of aorta: Secondary | ICD-10-CM

## 2020-01-08 DIAGNOSIS — R600 Localized edema: Secondary | ICD-10-CM | POA: Diagnosis not present

## 2020-01-08 LAB — BASIC METABOLIC PANEL WITH GFR
BUN: 12 mg/dL (ref 7–25)
CO2: 24 mmol/L (ref 20–32)
Calcium: 9.3 mg/dL (ref 8.6–10.4)
Chloride: 102 mmol/L (ref 98–110)
Creat: 0.72 mg/dL (ref 0.60–0.88)
GFR, Est African American: 91 mL/min/{1.73_m2} (ref >=60)
GFR, Est Non African American: 79 mL/min/{1.73_m2} (ref >=60)
Glucose, Bld: 108 mg/dL (ref 65–139)
Potassium: 3.6 mmol/L (ref 3.5–5.3)
Sodium: 133 mmol/L — ABNORMAL LOW (ref 135–146)

## 2020-01-08 LAB — LIPID PANEL
Cholesterol: 151 mg/dL (ref <200)
HDL: 37 mg/dL — ABNORMAL LOW (ref >=50)
LDL Cholesterol (Calc): 84 mg/dL (calc)
Non-HDL Cholesterol (Calc): 114 mg/dL (calc) (ref <130)
Total CHOL/HDL Ratio: 4.1 (calc) (ref <5.0)
Triglycerides: 205 mg/dL — ABNORMAL HIGH (ref <150)

## 2020-01-08 NOTE — Progress Notes (Signed)
Careteam: Patient Care Team: [name], NP as PCP - General (Geriatric Medicine) Alda Berthold, DO as Consulting Physician (Neurology) Gaynelle Arabian, MD as Consulting Physician (Orthopedic Surgery) Felipa Furnace, DPM as Consulting Physician (Podiatry) Renne Crigler, MD as Referring Physician (Obstetrics and Gynecology) Jola Schmidt, MD as Consulting Physician (Ophthalmology) Cordelia Poche, RN as Oncology Nurse Navigator Volanda Napoleon, MD as Medical Oncologist (Oncology)  PLACE OF SERVICE:  [place]  Advanced Directive information    Allergies  Allergen Reactions  . Sulfa Antibiotics Rash  . Gabapentin Other (See Comments)    Depressed   . Lyrica [Pregabalin] Other (See Comments)    unknown  . Paxil [Paroxetine]     unknown    Chief Complaint  Patient presents with  . Acute Visit    Patient is requesting [facility] services for PT, OT,speech therapy and oxygen.Oncologist wants her to have oxygen at night.      HPI: Patient is a 79 y.o. female for hospital follow up.   Pt with history of endometrial cancer diagnosed about 10 years ago, status post extensive surgery/radiation in 2011 and was in cancer remission, hypothyroidism, GERD, osteoporosis who presented to the [facility] emergency department for the evaluation of generalized weakness, poor oral intake, dysphagia after been seen in office.She also reported unintentional weight loss, intermittent diarrhea, nonproductive cough. she was found to have large right-sided pleural effusion and was hypoxic. She underwent thoracentesis. CT chest/abdomen/pelvis showed right lower lobe mass extending into the right hilum. Oncology recommended bronchoscopy biopsy for tissue specimen. 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  She underwent thoracentesis on the right side twice. She was stable and discharged to SNF on 2L [place].   Pt went to rehab after hospitalization however she left  before discharge orders were completed due to not being satisfied with things going on. Took someone 50 mins to come in her room to help her. She was afraid when she was there as well.  Left the facility 2 weeks ago and has not had PT/OT  She got her portacath for treatment.   Noted to have Acute hypoxic respiratory failure which  likely secondary to pleural effusion. She presented with hypoxia, tachypnea to hospital required 2 L oxygen/min for maintenance of saturation. She underwent ultrasound-guided left-sided thoracentesis twice while in hospital.  Reports shortness of breath during the night- using O2 currently but needs an order for this to get O2 at home.  Shortness of breath better during the day O2 95-96% during the day. But ongoing at night.   Dyspnea- was followed by ST doing better after thoracentesis.   Weight is up- appetite and intake has improved. Taking megace per Dr Marin Olp   She is wanting to be very aggressive with treatment.    Review of Systems:  Review of Systems  Constitutional: Negative for chills, fever and weight loss.  HENT: Negative for tinnitus.   Respiratory: Positive for shortness of breath (worse qhs). Negative for cough and sputum production.   Cardiovascular: Negative for chest pain, palpitations and leg swelling.  Gastrointestinal: Negative for abdominal pain, constipation, diarrhea and heartburn.       No longer with diarrhea or constipation   Genitourinary: Negative for dysuria, frequency and urgency.  Musculoskeletal: Negative for back pain, falls, joint pain and myalgias.  Skin: Negative.   Neurological: Positive for weakness. Negative for dizziness and headaches.  Psychiatric/Behavioral: Negative for depression and memory loss. The patient does not have insomnia.  Past Medical History:  Diagnosis Date  . Arthritis   . Cancer [facility])    Endometrial  . Complication of anesthesia    very hard to wake up from Anesthesia- then when wakes up  feels like she is going to faint or have vomiting  . Endometrial cancer [facility])    Per [facility] New Patient Packet   . Endometrial cancer ([place]) 12/18/2019  . Family history of adverse reaction to anesthesia    daughter has a hard time waking up from Anesthesia  . GERD (gastroesophageal reflux disease)   . Glaucoma   . Goals of care, counseling/discussion 12/18/2019  . History of fainting 2021   Per [facility] Patient Packet   . Hypothyroidism   . 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 PONV (postoperative nausea and vomiting)   . Thyroid disease    Past Surgical History:  Procedure Laterality Date  . ABDOMINAL HYSTERECTOMY    . APPENDECTOMY  1958  . BIOPSY  12/16/2019   Procedure: BIOPSY;  Surgeon: Thornton Park, MD;  Location: [facility] ENDOSCOPY;  Service: Gastroenterology;;  . BREAST EXCISIONAL BIOPSY Right    benign, was a cyst  . BREAST SURGERY Left    lumpectomy in 1989  . BRONCHIAL BIOPSY  12/22/2019   Procedure: BRONCHIAL BIOPSIES;  Surgeon: Marshell Garfinkel, MD;  Location: [facility]OS[facility];  Service: Cardiopulmonary;;  . BRONCHIAL BRUSHINGS  12/22/2019   Procedure: BRONCHIAL BRUSHINGS;  Surgeon: Marshell Garfinkel, MD;  Location: [facility];  Service: Cardiopulmonary;;  . CATARACT EXTRACTION  04/29/2019  . CATARACT EXTRACTION Right 09/2019  . CHOLECYSTECTOMY    . ENDOBRONCHIAL ULTRASOUND Bilateral 12/22/2019   Procedure: ENDOBRONCHIAL ULTRASOUND;  Surgeon: Marshell Garfinkel, MD;  Location: [facility];  Service: Cardiopulmonary;  Laterality: Bilateral;  . ESOPHAGOGASTRODUODENOSCOPY (EGD) WITH PROPOFOL N/A 12/16/2019   Procedure: ESOPHAGOGASTRODUODENOSCOPY (EGD) WITH PROPOFOL;  Surgeon: Thornton Park, MD;  Location: [facility];  Service: Gastroenterology;  Laterality: N/A;  likely with dilation  . FEMUR SURGERY  06/2013   Dr.Suarez   . IR IMAGING GUIDED PORT INSERTION   12/21/2019  . ROTATOR CUFF REPAIR  2003   Per [place] [facility] Patient Packet, Dr. Gwenlyn Perking   . TOTAL KNEE ARTHROPLASTY Right 01/05/2019   Procedure: TOTAL KNEE ARTHROPLASTY;  Surgeon: Gaynelle Arabian, MD;  Location: [facility] ORS;  Service: Orthopedics;  Laterality: Right;  59min  . VIDEO BRONCHOSCOPY  12/22/2019   Procedure: VIDEO BRONCHOSCOPY;  Surgeon: Marshell Garfinkel, MD;  Location: [facility];  Service: Cardiopulmonary;;   Social History:   reports that she has never smoked. She has never used smokeless tobacco. She reports that she does not drink alcohol and does not use drugs.  Family History  Problem Relation Age of Onset  . Arthritis Mother   . Osteoporosis Mother   . Heart disease Father   . Hearing loss Maternal Grandmother   . Hearing loss Maternal Grandfather   . Cancer Paternal Grandfather   . Cancer Sister     Medications: Patient's Medications  New Prescriptions   No medications on file  Previous Medications   ACETAMINOPHEN (TYLENOL) 500 MG TABLET    Take 1,000 mg by mouth daily. At bedtime.    BIMATOPROST (LUMIGAN) 0.01 % SOLN    Place 1 drop into both eyes at bedtime. Can not take Generic   BRIMONIDINE (ALPHAGAN P) 0.1 % SOLN    Place 1 drop into both eyes 2 (two) times  daily. Can not take generic   CALCIUM-VITAMIN D PO    Take 600 mg by mouth daily.    CHOLECALCIFEROL (VITAMIN D) 25 MCG (1000 UNIT) TABLET    Take 3,000 Units by mouth daily.    CHOLESTYRAMINE (QUESTRAN) 4 G PACKET    Take 4 g by mouth at bedtime.   LEVOTHYROXINE (SYNTHROID) 75 MCG TABLET    TAKE 1 TABLET(75 MCG) BY MOUTH DAILY   LIDOCAINE-PRILOCAINE (EMLA) CREAM    Apply 1 application topically as needed.   LORATADINE (CLARITIN) 10 MG TABLET    Take 10 mg by mouth daily.   MEGESTROL (MEGACE) 40 MG TABLET    Take 4 tablets (160 mg total) by mouth daily.   METOPROLOL TARTRATE (LOPRESSOR) 25 MG TABLET    Take 0.5 tablets (12.5 mg total) by mouth 2 (two) times daily.   RHOPRESSA 0.02 % SOLN    Place 1 drop into  the right eye daily.   VITAMIN B-12 (CYANOCOBALAMIN) 1000 MCG TABLET    Take 1,000 mcg by mouth daily.  Modified Medications   No medications on file  Discontinued Medications   ALENDRONATE (FOSAMAX) 70 MG TABLET    TAKE 1 TABLET BY MOUTH ONCE A WEEK, WITH WATER. DO NOT LIE DOWN FOR AT LEAST 30 MINUTES AND UNTIL AFTER FIRST MEAL OF THE DAY   DEXTROMETHORPHAN 15 MG/5ML SYRUP    Take 5 mLs (15 mg total) by mouth 4 (four) times daily as needed for cough.   PANTOPRAZOLE (PROTONIX) 40 MG TABLET    Take 1 tablet (40 mg total) by mouth 2 (two) times daily.    Physical Exam:  Vitals:   01/08/20 1105  BP: 123/70  Pulse: 81  Temp: 97.9 F (36.6 C)  TempSrc: Temporal  SpO2: 98%  Weight: 130 lb 12.8 oz (59.3 kg)  Height: 5' (1.524 m)   Body mass index is 25.55 kg/m. Wt Readings from Last 3 Encounters:  01/08/20 130 lb 12.8 oz (59.3 kg)  12/30/19 128 lb 1.3 oz (58.1 kg)  12/22/19 133 lb (60.3 kg)    Physical Exam Constitutional:      General: She is not in acute distress.    Appearance: She is well-developed. She is not diaphoretic.  HENT:     Head: Normocephalic and atraumatic.     Mouth/Throat:     Pharynx: No oropharyngeal exudate.  Eyes:     Conjunctiva/sclera: Conjunctivae normal.     Pupils: Pupils are equal, round, and reactive to light.  Cardiovascular:     Rate and Rhythm: Normal rate and regular rhythm.     Heart sounds: Normal heart sounds.  Pulmonary:     Effort: Pulmonary effort is normal.     Breath sounds: Wheezing (upper front lobes) present.  Abdominal:     General: Bowel sounds are normal.     Palpations: Abdomen is soft.  Musculoskeletal:        General: No tenderness.     Cervical back: Normal range of motion and neck supple.  Skin:    General: Skin is warm and dry.  Neurological:     Mental Status: She is alert and oriented to person, place, and time.     Labs reviewed: Basic Metabolic Panel: Recent Labs    12/16/19 0553 12/17/19 0527  12/19/19 0746 12/19/19 0746 12/20/19 0632 12/21/19 0516 12/30/19 1050  NA 128*   < > 131*   < > 133* 132* 128*  K 3.6   < > 3.7   < >  3.6 4.3 3.8  CL 98   < > 97*   < > 99 99 98  CO2 22   < > 24   < > 26 26 22   GLUCOSE 89   < > 104*   < > 99 95 97  BUN 11   < > 9   < > 11 11 12   CREATININE 0.61   < > 0.74   < > 0.64 0.62 0.59  CALCIUM 8.1*   < > 8.2*   < > 8.4* 8.4* 9.6  MG 1.9   < > 2.0  --  1.9 1.9  --   PHOS 2.5  --   --   --   --   --   --   TSH 1.835  --   --   --   --   --   --    < > = values in this interval not displayed.   Liver Function Tests: Recent Labs    12/14/19 1910 12/16/19 0553 12/30/19 1050  AST 16 14* 12*  ALT 13 12 10   ALKPHOS 49 39 34*  BILITOT 0.9 0.6 0.3  PROT 7.3 6.2* 6.6  ALBUMIN 3.6 2.7* 3.4*   Recent Labs    12/14/19 1910  LIPASE 24   No results for input(s): AMMONIA in the last 8760 hours. CBC: Recent Labs    12/17/19 0527 12/20/19 0632 12/30/19 1050  WBC 6.9 5.6 7.4  NEUTROABS 5.3 3.4 5.2  HGB 10.3* 10.4* 10.8*  HCT 32.1* 32.9* 33.9*  MCV 89.2 89.6 88.5  PLT 450* 435* 368   Lipid Panel: No results for input(s): CHOL, HDL, LDLCALC, TRIG, CHOLHDL, LDLDIRECT in the last 8760 hours. TSH: Recent Labs    12/16/19 0553  TSH 1.835   A1C: No results found for: HGBA1C   Assessment/Plan 1. Malignant neoplasm metastatic to right lung [facility]) -plans to be very aggressive with treatment. Followed by Dr Marin Olp oncologist at this time - Ambulatory referral to [place]  2. Acute respiratory failure with hypoxia (HCC) -requiring O2 at bedtime due to increase shortness of breath and in hospital and rehab had desaturations. She left AMA but had some O2 at home yet needs order for ongoing oxygen. Forms completed at this time.   - Ambulatory referral to Home Health  3. Dysphagia, unspecified type -has improved since she had pleural fluid drained.  - Ambulatory referral to [place]  4. Failure to thrive in adult With positive  weight gain, eating better now that she is out of the hospital and currently on megace per oncology.  - Ambulatory referral to Home Health  5. Hypernatremia Noted during hospitalization, will follow up - [place] GFR  6. Leg edema -ongoing, does not wish to wear compression hose. Encouraged elevation to LE with increase in nutrition.   7. Aortic atherosclerosis ([place]) -unable to tolerate ASA, reports she eats a poor high fat diet. Education provided about this. Will check lipids to get baseline due to atherosclerosis noted on imaging.  - Lipid Panel  Next appt: 3 months.  Carlos American. [place], [facility] [phone]

## 2020-01-08 NOTE — Telephone Encounter (Signed)
Yes, I placed orders during encounter

## 2020-01-08 NOTE — Telephone Encounter (Signed)
I called Amy back and authorized verbal order

## 2020-01-08 NOTE — Progress Notes (Signed)
Called for update on oncotype MAP results. The first block was unsuccessful with testing. They have reached back out for an additional tissue block which they will attempt testing on. If successful results should be received next week.  Dr Marin Olp notified.  Oncology Nurse Navigator Documentation  Oncology Nurse Navigator Flowsheets 01/08/2020  Abnormal Finding Date -  Confirmed Diagnosis Date -  Navigator Follow Up Date: 01/14/2020  Navigator Follow Up Reason: Pathology  Navigator Location [facility]  Navigator Encounter Type Pathology Review  Patient Visit Type MedOnc  Treatment Phase Pre-Tx/Tx Discussion  Barriers/Navigation Needs Coordination of Care;Education  Education -  Interventions Other  Acuity Level 2-Minimal Needs (1-2 Barriers Identified)  Coordination of Care -  Education Method -  Support Groups/Services Friends and Family  Time Spent with Patient 30

## 2020-01-08 NOTE — Patient Instructions (Signed)
Cancel follow up- reschedule for 3 month

## 2020-01-08 NOTE — Telephone Encounter (Signed)
Message left on clinical intake voicemail:   Amy, nurse with [facility] called requesting verbal orders for PT and OT. [facility] has spoken with the patient and her daughter whom have expressed interest in receiving these services from them.   Patient was seen in office today by [name], NP . Message will be sent to confirm ok to give orders

## 2020-01-09 DIAGNOSIS — I700 Atherosclerosis of aorta: Secondary | ICD-10-CM | POA: Diagnosis not present

## 2020-01-09 DIAGNOSIS — Z9981 Dependence on supplemental oxygen: Secondary | ICD-10-CM | POA: Diagnosis not present

## 2020-01-09 DIAGNOSIS — R1310 Dysphagia, unspecified: Secondary | ICD-10-CM | POA: Diagnosis not present

## 2020-01-09 DIAGNOSIS — J9601 Acute respiratory failure with hypoxia: Secondary | ICD-10-CM | POA: Diagnosis not present

## 2020-01-09 DIAGNOSIS — Z8542 Personal history of malignant neoplasm of other parts of uterus: Secondary | ICD-10-CM

## 2020-01-09 DIAGNOSIS — R627 Adult failure to thrive: Secondary | ICD-10-CM | POA: Diagnosis not present

## 2020-01-09 DIAGNOSIS — G629 Polyneuropathy, unspecified: Secondary | ICD-10-CM | POA: Diagnosis not present

## 2020-01-09 DIAGNOSIS — Z95818 Presence of other cardiac implants and grafts: Secondary | ICD-10-CM | POA: Diagnosis not present

## 2020-01-09 DIAGNOSIS — C7801 Secondary malignant neoplasm of right lung: Secondary | ICD-10-CM | POA: Diagnosis not present

## 2020-01-09 DIAGNOSIS — M810 Age-related osteoporosis without current pathological fracture: Secondary | ICD-10-CM | POA: Diagnosis not present

## 2020-01-13 DIAGNOSIS — J9601 Acute respiratory failure with hypoxia: Secondary | ICD-10-CM | POA: Diagnosis not present

## 2020-01-13 DIAGNOSIS — R1310 Dysphagia, unspecified: Secondary | ICD-10-CM | POA: Diagnosis not present

## 2020-01-13 DIAGNOSIS — C7801 Secondary malignant neoplasm of right lung: Secondary | ICD-10-CM | POA: Diagnosis not present

## 2020-01-13 DIAGNOSIS — Z8542 Personal history of malignant neoplasm of other parts of uterus: Secondary | ICD-10-CM | POA: Diagnosis not present

## 2020-01-13 DIAGNOSIS — R627 Adult failure to thrive: Secondary | ICD-10-CM | POA: Diagnosis not present

## 2020-01-13 DIAGNOSIS — I700 Atherosclerosis of aorta: Secondary | ICD-10-CM | POA: Diagnosis not present

## 2020-01-14 ENCOUNTER — Telehealth: Payer: Self-pay | Admitting: *Deleted

## 2020-01-14 NOTE — Telephone Encounter (Signed)
Will with [facility] called requesting verbal orders for OT 2x1wk, 1x2wk,2x1wk Verbal orders given.

## 2020-01-15 DIAGNOSIS — I700 Atherosclerosis of aorta: Secondary | ICD-10-CM | POA: Diagnosis not present

## 2020-01-15 DIAGNOSIS — R1310 Dysphagia, unspecified: Secondary | ICD-10-CM | POA: Diagnosis not present

## 2020-01-15 DIAGNOSIS — J9601 Acute respiratory failure with hypoxia: Secondary | ICD-10-CM | POA: Diagnosis not present

## 2020-01-15 DIAGNOSIS — C7801 Secondary malignant neoplasm of right lung: Secondary | ICD-10-CM | POA: Diagnosis not present

## 2020-01-15 DIAGNOSIS — R627 Adult failure to thrive: Secondary | ICD-10-CM | POA: Diagnosis not present

## 2020-01-15 DIAGNOSIS — Z8542 Personal history of malignant neoplasm of other parts of uterus: Secondary | ICD-10-CM | POA: Diagnosis not present

## 2020-01-18 DIAGNOSIS — R1310 Dysphagia, unspecified: Secondary | ICD-10-CM | POA: Diagnosis not present

## 2020-01-18 DIAGNOSIS — C7801 Secondary malignant neoplasm of right lung: Secondary | ICD-10-CM | POA: Diagnosis not present

## 2020-01-18 DIAGNOSIS — J9601 Acute respiratory failure with hypoxia: Secondary | ICD-10-CM | POA: Diagnosis not present

## 2020-01-18 DIAGNOSIS — R627 Adult failure to thrive: Secondary | ICD-10-CM | POA: Diagnosis not present

## 2020-01-18 DIAGNOSIS — I700 Atherosclerosis of aorta: Secondary | ICD-10-CM | POA: Diagnosis not present

## 2020-01-18 DIAGNOSIS — Z8542 Personal history of malignant neoplasm of other parts of uterus: Secondary | ICD-10-CM | POA: Diagnosis not present

## 2020-01-19 ENCOUNTER — Ambulatory Visit: Payer: Medicare Other | Admitting: Pulmonary Disease

## 2020-01-19 DIAGNOSIS — J9601 Acute respiratory failure with hypoxia: Secondary | ICD-10-CM | POA: Diagnosis not present

## 2020-01-19 DIAGNOSIS — C7801 Secondary malignant neoplasm of right lung: Secondary | ICD-10-CM | POA: Diagnosis not present

## 2020-01-19 DIAGNOSIS — Z8542 Personal history of malignant neoplasm of other parts of uterus: Secondary | ICD-10-CM | POA: Diagnosis not present

## 2020-01-19 DIAGNOSIS — R627 Adult failure to thrive: Secondary | ICD-10-CM | POA: Diagnosis not present

## 2020-01-19 DIAGNOSIS — I700 Atherosclerosis of aorta: Secondary | ICD-10-CM | POA: Diagnosis not present

## 2020-01-19 DIAGNOSIS — R1310 Dysphagia, unspecified: Secondary | ICD-10-CM | POA: Diagnosis not present

## 2020-01-20 ENCOUNTER — Ambulatory Visit: Payer: Medicare Other

## 2020-01-20 ENCOUNTER — Inpatient Hospital Stay: Payer: Medicare Other

## 2020-01-20 ENCOUNTER — Encounter: Payer: Self-pay | Admitting: *Deleted

## 2020-01-20 ENCOUNTER — Other Ambulatory Visit: Payer: Self-pay

## 2020-01-20 ENCOUNTER — Inpatient Hospital Stay: Payer: Medicare Other | Attending: Hematology & Oncology | Admitting: Hematology & Oncology

## 2020-01-20 ENCOUNTER — Ambulatory Visit (HOSPITAL_BASED_OUTPATIENT_CLINIC_OR_DEPARTMENT_OTHER)
Admission: RE | Admit: 2020-01-20 | Discharge: 2020-01-20 | Disposition: A | Discharge status: Home / Self Care | Payer: Medicare Other | Source: Ambulatory Visit | Attending: Hematology & Oncology | Admitting: Hematology & Oncology

## 2020-01-20 VITALS — BP 155/78 | HR 80 | Temp 97.5°F | Resp 20 | Wt 132.6 lb

## 2020-01-20 DIAGNOSIS — Z5111 Encounter for antineoplastic chemotherapy: Secondary | ICD-10-CM | POA: Insufficient documentation

## 2020-01-20 DIAGNOSIS — C541 Malignant neoplasm of endometrium: Secondary | ICD-10-CM

## 2020-01-20 DIAGNOSIS — J90 Pleural effusion, not elsewhere classified: Secondary | ICD-10-CM | POA: Insufficient documentation

## 2020-01-20 DIAGNOSIS — Z95828 Presence of other vascular implants and grafts: Secondary | ICD-10-CM

## 2020-01-20 DIAGNOSIS — Z5189 Encounter for other specified aftercare: Secondary | ICD-10-CM | POA: Insufficient documentation

## 2020-01-20 DIAGNOSIS — C7800 Secondary malignant neoplasm of unspecified lung: Secondary | ICD-10-CM | POA: Insufficient documentation

## 2020-01-20 DIAGNOSIS — R0602 Shortness of breath: Secondary | ICD-10-CM | POA: Diagnosis not present

## 2020-01-20 DIAGNOSIS — G629 Polyneuropathy, unspecified: Secondary | ICD-10-CM | POA: Insufficient documentation

## 2020-01-20 DIAGNOSIS — Z79899 Other long term (current) drug therapy: Secondary | ICD-10-CM | POA: Insufficient documentation

## 2020-01-20 LAB — CBC WITH DIFFERENTIAL (CANCER CENTER ONLY)
Abs Immature Granulocytes: 0.03 10*3/uL (ref 0.00–0.07)
Basophils Absolute: 0 10*3/uL (ref 0.0–0.1)
Basophils Relative: 1 %
Eosinophils Absolute: 0.3 10*3/uL (ref 0.0–0.5)
Eosinophils Relative: 5 %
HCT: 34.3 % — ABNORMAL LOW (ref 36.0–46.0)
Hemoglobin: 11 g/dL — ABNORMAL LOW (ref 12.0–15.0)
Immature Granulocytes: 1 %
Lymphocytes Relative: 19 %
Lymphs Abs: 1.2 10*3/uL (ref 0.7–4.0)
MCH: 28.8 pg (ref 26.0–34.0)
MCHC: 32.1 g/dL (ref 30.0–36.0)
MCV: 89.8 fL (ref 80.0–100.0)
Monocytes Absolute: 0.9 10*3/uL (ref 0.1–1.0)
Monocytes Relative: 14 %
Neutro Abs: 3.9 10*3/uL (ref 1.7–7.7)
Neutrophils Relative %: 60 %
Platelet Count: 312 10*3/uL (ref 150–400)
RBC: 3.82 MIL/uL — ABNORMAL LOW (ref 3.87–5.11)
RDW: 16.3 % — ABNORMAL HIGH (ref 11.5–15.5)
WBC Count: 6.4 10*3/uL (ref 4.0–10.5)
nRBC: 0 % (ref 0.0–0.2)

## 2020-01-20 LAB — CMP (CANCER CENTER ONLY)
ALT: 17 U/L (ref 0–44)
AST: 14 U/L — ABNORMAL LOW (ref 15–41)
Albumin: 3.7 g/dL (ref 3.5–5.0)
Alkaline Phosphatase: 32 U/L — ABNORMAL LOW (ref 38–126)
Anion gap: 7 (ref 5–15)
BUN: 17 mg/dL (ref 8–23)
CO2: 21 mmol/L — ABNORMAL LOW (ref 22–32)
Calcium: 10.2 mg/dL (ref 8.9–10.3)
Chloride: 102 mmol/L (ref 98–111)
Creatinine: 0.81 mg/dL (ref 0.44–1.00)
GFR, Estimated: >60 mL/min (ref >60)
Glucose, Bld: 96 mg/dL (ref 70–99)
Potassium: 4 mmol/L (ref 3.5–5.1)
Sodium: 130 mmol/L — ABNORMAL LOW (ref 135–145)
Total Bilirubin: 0.3 mg/dL (ref 0.3–1.2)
Total Protein: 6.6 g/dL (ref 6.5–8.1)

## 2020-01-20 MED ORDER — DEXAMETHASONE 4 MG PO TABS: 8.0000 mg | ORAL_TABLET | Freq: Two times a day (BID) | ORAL | 1 refills | Status: DC

## 2020-01-20 MED ORDER — SODIUM CHLORIDE 0.9% FLUSH
10.0000 mL | Freq: Once | INTRAVENOUS | Status: AC
  Administered 2020-01-20: 15:00:00 10 mL via INTRAVENOUS
  Filled 2020-01-20: qty 10

## 2020-01-20 MED ORDER — HEPARIN SOD (PORK) LOCK FLUSH 100 UNIT/ML IV SOLN
500.0000 [IU] | Freq: Once | INTRAVENOUS | Status: AC
  Administered 2020-01-20: 15:00:00 500 [IU] via INTRAVENOUS
  Filled 2020-01-20: qty 5

## 2020-01-20 NOTE — Progress Notes (Signed)
Pt discharged in no apparent distress. Pt left ambulatory with daughter. Pt aware of discharge instructions and verbalized understanding and had no further questions.

## 2020-01-20 NOTE — Progress Notes (Signed)
START OFF PATHWAY REGIMEN - Uterine   [id number]:Carboplatin + Docetaxel (6/75) every 21 days:   A cycle is every 21 days:     Docetaxel      Carboplatin   **Always confirm dose/schedule in your pharmacy ordering system**  Patient Characteristics: Endometrioid, Newly Diagnosed (Clinical Staging), Nonsurgical Candidate, Stage III-IV Histology: Endometrioid Therapeutic Status: Newly Diagnosed (Clinical Staging) AJCC T Category: cT4 Surgical Candidacy: Nonsurgical Candidate AJCC N Category: cN1 AJCC 8 Stage Grouping: IVB AJCC M Category: pM1  Intent of Therapy: Non-Curative / Palliative Intent, Discussed with Patient

## 2020-01-20 NOTE — Progress Notes (Signed)
Navigator was out of the office during this patient's initial [place] appointment. As such, this was my first face to face with patient.   Initial RN Navigator Patient Visit  Name: Katie [name] Diagnosis: Endometrial Cancer  Met with patient prior to their visit with MD. Hanley Seamen patient "Your Patient Navigator" handout which explains my role, areas in which I am able to help, and all the contact information for myself and the office. Also gave patient MD and Navigator business card. Reviewed with patient the general overview of expected course after initial diagnosis and time frame for all steps to be completed.  New patient packet given to patient which includes: orientation to office and staff; campus directory; education on My Chart and Advance Directives; and patient centered education on endometrial cancer.   Patient has completed work up for diagnosis. Port has been placed. Molecular testing was attempted but unsuccessful. Today, [place] liquid biopsy will be sent. She is scheduled for chemo education on 01/26/20. Once treatment plan is entered she will be scheduled to begin next week.   Patient understands all follow up procedures and expectations. They have my number to reach out for any further clarification or additional needs.   Oncology Nurse Navigator Documentation  Oncology Nurse Navigator Flowsheets 01/20/2020  Abnormal Finding Date -  Confirmed Diagnosis Date -  Navigator Follow Up Date: 01/26/2020  Navigator Follow Up Reason: Engineer, petroleum  Navigator Encounter Type Follow-up Appt  Patient Visit Type MedOnc  Treatment Phase Pre-Tx/Tx Discussion  Barriers/Navigation Needs Coordination of Care;Education  Education -  Interventions Coordination of Care;Education;Psycho-Social Support  Acuity Level 2-Minimal Needs (1-2 Barriers Identified)  Coordination of Care Appts  Education Method Verbal;Written  Support Groups/Services Friends and  Family  Time Spent with Patient 76

## 2020-01-20 NOTE — Progress Notes (Signed)
Hematology and Oncology Follow Up Visit  Alaria Oconnor [id number] 1940/12/18 79 y.o. 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 daily  Carboplatin/Taxotere/Avastin -- start cycle #1 on 01/27/2020     Interim History:  Ms. Bieker is back for her follow-up.  Unfortunately, we got the molecular analysis back from her biopsies.  There was not enough material for molecular analysis.  This I really hate to see happen.  As such, we will try a liquid biopsy to see if we cannot find any actionable mutations that way.  I really need to see if this tumor is HER-2 positive or if there is a high MSI/deficient MMR.  She is little more short of breath.  She had a chest x-ray today.  Results are still pending.  Her appetite is doing pretty well.  She is having no problems with dysphagia or odynophagia.  She is eating well.  She is having no change in bowel or bladder habits.  We really need to get going with chemotherapy.  She currently is on Megace because the tumor is estrogen positive.  Have I think that it would be worthwhile to try her on carboplatinum/Taxotere/Avastin.  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  The neuropathy could have been from past chemotherapy which she had 11 years ago.  I have to believe that she had Taxol 11 years ago.  We are trying to get records from her doctors up in [place] state.  She has had no headache.  She has had no mouth sores.  She has not noted any palpable lymph nodes.  Currently, I would say her performance status is ECOG 1.      Medications:  Current Outpatient Medications:  .  acetaminophen (TYLENOL) 500 MG tablet, Take 1,000 mg by mouth daily. At bedtime., Disp: , Rfl:  .  bimatoprost (LUMIGAN) 0.01 % SOLN, Place 1 drop into both eyes at bedtime. Can not take Generic, Disp: , Rfl:  .  brimonidine (ALPHAGAN P)  0.1 % SOLN, Place 1 drop into both eyes 2 (two) times daily. Can not take generic, Disp: , Rfl:  .  CALCIUM-VITAMIN D PO, Take 600 mg by mouth daily. , Disp: , Rfl:  .  cholecalciferol (VITAMIN D) 25 MCG (1000 UNIT) tablet, Take 3,000 Units by mouth daily. , Disp: , Rfl:  .  cholestyramine (QUESTRAN) 4 g packet, Take 4 g by mouth at bedtime., Disp: , Rfl:  .  levothyroxine (SYNTHROID) 75 MCG tablet, TAKE 1 TABLET(75 MCG) BY MOUTH DAILY (Patient taking differently: Take 75 mcg by mouth daily before breakfast.), Disp: 90 tablet, Rfl: 3 .  lidocaine-prilocaine (EMLA) cream, Apply 1 application topically as needed., Disp: 30 g, Rfl: 0 .  loratadine (CLARITIN) 10 MG tablet, Take 10 mg by mouth daily., Disp: , Rfl:  .  megestrol (MEGACE) 40 MG tablet, Take 4 tablets (160 mg total) by mouth daily., Disp: 120 tablet, Rfl: 1 .  metoprolol tartrate (LOPRESSOR) 25 MG tablet, Take 0.5 tablets (12.5 mg total) by mouth 2 (two) times daily., Disp: , Rfl:  .  RHOPRESSA 0.02 % SOLN, Place 1 drop into the right eye daily., Disp: , Rfl:  .  vitamin B-12 (CYANOCOBALAMIN) 1000 MCG tablet, Take 1,000 mcg by mouth daily., Disp: , Rfl:   Allergies:  Allergies  Allergen Reactions  . Sulfa Antibiotics Rash  . Gabapentin Other (See Comments)  Depressed   . Lyrica [Pregabalin] Other (See Comments)    unknown  . Paxil [Paroxetine]     unknown    Past Medical History, Surgical history, Social history, and Family History were reviewed and updated.  Review of Systems: Review of Systems  Constitutional: Positive for fatigue and unexpected weight change.  HENT:   Positive for trouble swallowing.   Eyes: Negative.   Respiratory: Positive for chest tightness and shortness of breath.   Cardiovascular: Negative.   Gastrointestinal: Negative.   Endocrine: Negative.   Genitourinary: Negative.    Musculoskeletal: Negative.   Skin: Negative.   Neurological: Negative.   Hematological: Negative.    Psychiatric/Behavioral: Negative.     Physical Exam:  weight is 132 lb 9.6 oz (60.1 kg). Her oral temperature is 97.5 F (36.4 C) (abnormal). Her blood pressure is 155/78 (abnormal) and her pulse is 80. Her respiration is 20 and oxygen saturation is 97%.   Wt Readings from Last 3 Encounters:  01/20/20 132 lb 9.6 oz (60.1 kg)  01/08/20 130 lb 12.8 oz (59.3 kg)  12/30/19 128 lb 1.3 oz (58.1 kg)    Physical Exam Vitals reviewed.  HENT:     Head: Normocephalic and atraumatic.  Eyes:     Pupils: Pupils are equal, round, and reactive to light.  Cardiovascular:     Rate and Rhythm: Normal rate and regular rhythm.     Heart sounds: Normal heart sounds.  Pulmonary:     Effort: Pulmonary effort is normal.     Breath sounds: Normal breath sounds.  Abdominal:     General: Bowel sounds are normal.     Palpations: Abdomen is soft.  Musculoskeletal:        General: No tenderness or deformity. Normal range of motion.     Cervical back: Normal range of motion.  Lymphadenopathy:     Cervical: No cervical adenopathy.  Skin:    General: Skin is warm and dry.     Findings: No erythema or rash.  Neurological:     Mental Status: She is alert and oriented to person, place, and time.  Psychiatric:        Behavior: Behavior normal.        Thought Content: Thought content normal.        Judgment: Judgment normal.      Lab Results  Component Value Date   WBC 6.4 01/20/2020   HGB 11.0 (L) 01/20/2020   HCT 34.3 (L) 01/20/2020   MCV 89.8 01/20/2020   PLT 312 01/20/2020     Chemistry      Component Value Date/Time   NA 130 (L) 01/20/2020 1437   K 4.0 01/20/2020 1437   CL 102 01/20/2020 1437   CO2 21 (L) 01/20/2020 1437   BUN 17 01/20/2020 1437   CREATININE 0.81 01/20/2020 1437   CREATININE 0.72 01/08/2020 1151      Component Value Date/Time   CALCIUM 10.2 01/20/2020 1437   ALKPHOS 32 (L) 01/20/2020 1437   AST 14 (L) 01/20/2020 1437   ALT 17 01/20/2020 1437   BILITOT 0.3  01/20/2020 1437      Impression and Plan:   Ms. Engelmann is a very charming 79 year old white female.  She has a lot of spunk.  She really is motivated to try to get better.  She wants to be aggressive with therapy.  Hopefully, we can be aggressive with therapy.    We are little bit "hamstrung" by the fact that we cannot get enough  material for molecular analysis.  Again, may be our liquid biopsies will help [place] out.  I really think that the combination of chemotherapy with Avastin would be a good idea.  I think she could tolerate this.  I talked to them about treatment.  I went over some of the side effects.  They understand really well that this is a situation that we can treat but cannot cure.  Hopefully, she will respond to treatment since she really has had no treatment for 11 years.  We will plan to start her first cycle of treatment next week.  She really wants to get started.  I think this would be reasonable.  I will plan to see her back for the start of her second cycle of treatment.  We will repeat a scan after her second cycle.   Of note, her CA 125 has been elevated.  We could definitely use this as a marker for response.    Volanda Napoleon, MD 12/15/20215:23 PM

## 2020-01-20 NOTE — Patient Instructions (Signed)
Tunneled Central Venous Catheter Flushing Guide  It is important to flush your tunneled central venous catheter each time you use it, both before and after you use it. Flushing your catheter will help prevent it from clogging. What are the risks? Risks may include:  Infection.  Air getting into the catheter and bloodstream. Supplies needed:  A clean pair of gloves.  A disinfecting wipe. Use an alcohol wipe, chlorhexidine wipe, or iodine wipe as told by your health care provider.  A 10 mL syringe that has been prefilled with saline solution.  An empty 10 mL syringe, if a substance called heparin was injected into your catheter. How to flush your catheter When you flush your catheter, make sure you follow any specific instructions from your health care provider or the manufacturer. These are general guidelines. Flushing your catheter before use If there is heparin in your catheter: 1. Wash your hands with soap and water. 2. Put on gloves. 3. Scrub the injection cap for a minimum of 15 seconds with a disinfecting wipe. 4. Unclamp the catheter. 5. Attach the empty syringe to the injection cap. 6. Pull the syringe plunger back and withdraw 10 mL of blood. 7. Place the syringe into an appropriate waste container. 8. Scrub the injection cap for 15 seconds with a disinfecting wipe. 9. Attach the prefilled syringe to the injection cap. 10. Flush the catheter by pushing the plunger forward until all the liquid from the syringe is in the catheter. 11. Remove the syringe from the injection cap. 12. Clamp the catheter. If there is no heparin in your catheter: 1. Wash your hands with soap and water. 2. Put on gloves. 3. Scrub the injection cap for 15 seconds with a disinfecting wipe. 4. Unclamp the catheter. 5. Attach the prefilled syringe to the injection cap. 6. Flush the catheter by pushing the plunger forward until 5 mL of the liquid from the syringe is in the catheter. 7. Pull back on  the syringe until you see blood in the catheter. 8. If you have been asked to collect any blood, follow your health care provider's instructions. Otherwise, flush the catheter with the rest of the solution from the syringe. 9. Remove the syringe from the injection cap. 10. Clamp the catheter.  Flushing your catheter after use 1. Wash your hands with soap and water. 2. Put on gloves. 3. Scrub the injection cap for 15 seconds with a disinfecting wipe. 4. Unclamp the catheter. 5. Attach the prefilled syringe to the injection cap. 6. Flush the catheter by pushing the plunger forward until all of the liquid from the syringe is in the catheter. 7. Remove the syringe from the injection cap. 8. Clamp the catheter. Problems and solutions  If blood cannot be completely cleared from the injection cap, you may need to have the injection cap replaced.  If the catheter is difficult to flush, use the pulsing method. The pulsing method involves pushing only a few milliliters of solution into the catheter at a time and pausing between pushes.  If you do not see blood in the catheter when you pull back on the syringe, change your body position, such as by raising your arms above your head. Take a deep breath and cough. Then, pull back on the syringe. If you still do not see blood, flush the catheter with a small amount of solution. Then, change positions again and take a breath or cough. Pull back on the syringe again. If you still do not see   blood, finish flushing the catheter and contact your health care provider. Do not use your catheter until your health care provider says it is okay. General tips  Have someone help you flush your catheter, if possible.  Do not force fluid through your catheter.  Do not use a syringe that is larger or smaller than 10 mL. Using a smaller syringe can make the catheter burst.  Do not use your catheter without flushing it first if it has heparin in it. Contact a health  care provider if:  You cannot see any blood in the catheter when you flush it before using it.  Your catheter is difficult to flush. Get help right away if:  You cannot flush the catheter.  The catheter leaks when you flush it or when there is fluid in it.  There are cracks or breaks in the catheter. Summary  It is important to flush your tunneled central venous catheter each time you use it, both before and after you use it.  Scrub the injection cap for 15 seconds with a disinfecting wipe before and after you flush it.  When you flush your catheter, make sure you follow any specific instructions from your health care provider or the manufacturer.  Get help right away if you cannot flush the catheter. This information is not intended to replace advice given to you by your health care provider. Make sure you discuss any questions you have with your health care provider. Document Revised: 10/17/2018 Document Reviewed: 04/09/2018 Elsevier Patient Education  2020 Elsevier Inc.  

## 2020-01-21 ENCOUNTER — Encounter: Payer: Self-pay | Admitting: *Deleted

## 2020-01-21 DIAGNOSIS — Z8542 Personal history of malignant neoplasm of other parts of uterus: Secondary | ICD-10-CM | POA: Diagnosis not present

## 2020-01-21 DIAGNOSIS — R627 Adult failure to thrive: Secondary | ICD-10-CM | POA: Diagnosis not present

## 2020-01-21 DIAGNOSIS — J9601 Acute respiratory failure with hypoxia: Secondary | ICD-10-CM | POA: Diagnosis not present

## 2020-01-21 DIAGNOSIS — I700 Atherosclerosis of aorta: Secondary | ICD-10-CM | POA: Diagnosis not present

## 2020-01-21 DIAGNOSIS — C7801 Secondary malignant neoplasm of right lung: Secondary | ICD-10-CM | POA: Diagnosis not present

## 2020-01-21 DIAGNOSIS — R1310 Dysphagia, unspecified: Secondary | ICD-10-CM | POA: Diagnosis not present

## 2020-01-21 NOTE — Progress Notes (Signed)
Treatment plan entered and doesn't require PA. Scheduled to begin next week. Called and reviewed all appointments with patient including date, time and location. Reviewed basic chemo education in regards to length of visit and expectations for Wednesday. Also reviewed injection appointment. She is scheduled for chemo education on Tuesday. At this time she has no further questions but knows to call the office with any questions or concerns.   Oncology Nurse Navigator Documentation  Oncology Nurse Navigator Flowsheets 01/21/2020  Abnormal Finding Date -  Confirmed Diagnosis Date -  Navigator Follow Up Date: 01/27/2020  Navigator Follow Up Reason: Chemotherapy  Navigator Location [facility]  Navigator Encounter Type Follow-up Appt  Patient Visit Type MedOnc  Treatment Phase Pre-Tx/Tx Discussion  Barriers/Navigation Needs Coordination of International aid/development worker for Upcoming Surgery/ Treatment  Interventions Coordination of Care;Education;Psycho-Social Support  Acuity Level 2-Minimal Needs (1-2 Barriers Identified)  Coordination of Care Appts  Education Method Verbal  Support Groups/Services Friends and Family  Time Spent with Patient 30

## 2020-01-22 DIAGNOSIS — C7801 Secondary malignant neoplasm of right lung: Secondary | ICD-10-CM | POA: Diagnosis not present

## 2020-01-22 DIAGNOSIS — R1310 Dysphagia, unspecified: Secondary | ICD-10-CM | POA: Diagnosis not present

## 2020-01-22 DIAGNOSIS — J9601 Acute respiratory failure with hypoxia: Secondary | ICD-10-CM | POA: Diagnosis not present

## 2020-01-22 DIAGNOSIS — I700 Atherosclerosis of aorta: Secondary | ICD-10-CM | POA: Diagnosis not present

## 2020-01-22 DIAGNOSIS — R627 Adult failure to thrive: Secondary | ICD-10-CM | POA: Diagnosis not present

## 2020-01-22 DIAGNOSIS — Z8542 Personal history of malignant neoplasm of other parts of uterus: Secondary | ICD-10-CM | POA: Diagnosis not present

## 2020-01-25 ENCOUNTER — Other Ambulatory Visit: Payer: Self-pay | Admitting: *Deleted

## 2020-01-25 DIAGNOSIS — C541 Malignant neoplasm of endometrium: Secondary | ICD-10-CM

## 2020-01-25 MED ORDER — DEXAMETHASONE 4 MG PO TABS: 8.0000 mg | ORAL_TABLET | Freq: Two times a day (BID) | ORAL | 1 refills | Status: DC

## 2020-01-25 MED ORDER — ONDANSETRON HCL 8 MG PO TABS: 8.0000 mg | ORAL_TABLET | Freq: Two times a day (BID) | ORAL | 1 refills | Status: AC | PRN

## 2020-01-25 MED ORDER — LIDOCAINE-PRILOCAINE 2.5-2.5 % EX CREA: TOPICAL_CREAM | CUTANEOUS | 3 refills | Status: DC

## 2020-01-25 MED ORDER — PROCHLORPERAZINE MALEATE 10 MG PO TABS: 10.0000 mg | ORAL_TABLET | Freq: Four times a day (QID) | ORAL | 1 refills | Status: AC | PRN

## 2020-01-26 ENCOUNTER — Encounter: Payer: Self-pay | Admitting: *Deleted

## 2020-01-26 ENCOUNTER — Other Ambulatory Visit: Payer: Self-pay

## 2020-01-26 ENCOUNTER — Inpatient Hospital Stay: Payer: Medicare Other

## 2020-01-26 NOTE — Progress Notes (Signed)
Patient in chemotherapy education class with  Daughter [name].   Discussed side effects of Taxotere, Carboplatin, Avastin  which include but are not limited to myelosuppression, decreased appetite, fatigue, fever, allergic or infusional reaction, mucositis, cardiac toxicity, cough, SOB, altered taste, nausea and vomiting, diarrhea, constipation, elevated LFTs myalgia and arthralgias, hair loss or thinning, rash, skin dryness, nail changes, peripheral neuropathy, discolored urine, delayed wound healing, mental changes (Chemo brain), increased risk of infections, weight loss.  Reviewed infusion room and office policy and procedure and phone numbers 24 hours x 7 days a week.   Reviewed when to call the office with any concerns or problems.  Scientist, clinical (histocompatibility and immunogenetics) given.  Discussed portacath insertion and EMLA cream administration.  Antiemetic protocol and chemotherapy schedule reviewed. Patient verbalized understanding of chemotherapy indications and possible side effects.  Teachback done

## 2020-01-27 ENCOUNTER — Other Ambulatory Visit: Payer: Medicare Other

## 2020-01-27 ENCOUNTER — Inpatient Hospital Stay: Payer: Medicare Other

## 2020-01-27 ENCOUNTER — Encounter: Payer: Self-pay | Admitting: *Deleted

## 2020-01-27 ENCOUNTER — Other Ambulatory Visit: Payer: Self-pay | Admitting: Hematology & Oncology

## 2020-01-27 VITALS — BP 163/78 | HR 89 | Temp 98.2°F | Resp 17

## 2020-01-27 DIAGNOSIS — G629 Polyneuropathy, unspecified: Secondary | ICD-10-CM | POA: Diagnosis not present

## 2020-01-27 DIAGNOSIS — C541 Malignant neoplasm of endometrium: Secondary | ICD-10-CM | POA: Diagnosis not present

## 2020-01-27 DIAGNOSIS — J90 Pleural effusion, not elsewhere classified: Secondary | ICD-10-CM | POA: Diagnosis not present

## 2020-01-27 DIAGNOSIS — J910 Malignant pleural effusion: Secondary | ICD-10-CM

## 2020-01-27 DIAGNOSIS — Z5111 Encounter for antineoplastic chemotherapy: Secondary | ICD-10-CM | POA: Diagnosis not present

## 2020-01-27 DIAGNOSIS — C7800 Secondary malignant neoplasm of unspecified lung: Secondary | ICD-10-CM | POA: Diagnosis not present

## 2020-01-27 DIAGNOSIS — Z5189 Encounter for other specified aftercare: Secondary | ICD-10-CM | POA: Diagnosis not present

## 2020-01-27 LAB — CMP (CANCER CENTER ONLY)
ALT: 10 U/L (ref 0–44)
AST: 11 U/L — ABNORMAL LOW (ref 15–41)
Albumin: 3.8 g/dL (ref 3.5–5.0)
Alkaline Phosphatase: 28 U/L — ABNORMAL LOW (ref 38–126)
Anion gap: 11 (ref 5–15)
BUN: 15 mg/dL (ref 8–23)
CO2: 19 mmol/L — ABNORMAL LOW (ref 22–32)
Calcium: 9.8 mg/dL (ref 8.9–10.3)
Chloride: 100 mmol/L (ref 98–111)
Creatinine: 0.79 mg/dL (ref 0.44–1.00)
GFR, Estimated: >60 mL/min (ref >60)
Glucose, Bld: 221 mg/dL — ABNORMAL HIGH (ref 70–99)
Potassium: 3.8 mmol/L (ref 3.5–5.1)
Sodium: 130 mmol/L — ABNORMAL LOW (ref 135–145)
Total Bilirubin: 0.4 mg/dL (ref 0.3–1.2)
Total Protein: 6.9 g/dL (ref 6.5–8.1)

## 2020-01-27 LAB — CBC WITH DIFFERENTIAL (CANCER CENTER ONLY)
Abs Immature Granulocytes: 0.03 10*3/uL (ref 0.00–0.07)
Basophils Absolute: 0 10*3/uL (ref 0.0–0.1)
Basophils Relative: 0 %
Eosinophils Absolute: 0 10*3/uL (ref 0.0–0.5)
Eosinophils Relative: 0 %
HCT: 33.7 % — ABNORMAL LOW (ref 36.0–46.0)
Hemoglobin: 10.9 g/dL — ABNORMAL LOW (ref 12.0–15.0)
Immature Granulocytes: 0 %
Lymphocytes Relative: 10 %
Lymphs Abs: 0.8 10*3/uL (ref 0.7–4.0)
MCH: 29 pg (ref 26.0–34.0)
MCHC: 32.3 g/dL (ref 30.0–36.0)
MCV: 89.6 fL (ref 80.0–100.0)
Monocytes Absolute: 0.3 10*3/uL (ref 0.1–1.0)
Monocytes Relative: 3 %
Neutro Abs: 6.7 10*3/uL (ref 1.7–7.7)
Neutrophils Relative %: 87 %
Platelet Count: 343 10*3/uL (ref 150–400)
RBC: 3.76 MIL/uL — ABNORMAL LOW (ref 3.87–5.11)
RDW: 16.2 % — ABNORMAL HIGH (ref 11.5–15.5)
WBC Count: 7.8 10*3/uL (ref 4.0–10.5)
nRBC: 0 % (ref 0.0–0.2)

## 2020-01-27 MED ORDER — SODIUM CHLORIDE 0.9 % IV SOLN
Freq: Once | INTRAVENOUS | Status: AC
  Filled 2020-01-27: qty 250

## 2020-01-27 MED ORDER — SODIUM CHLORIDE 0.9 % IV SOLN
150.0000 mg | Freq: Once | INTRAVENOUS | Status: AC
  Administered 2020-01-27: 10:00:00 150 mg via INTRAVENOUS
  Filled 2020-01-27: qty 150

## 2020-01-27 MED ORDER — SODIUM CHLORIDE 0.9 % IV SOLN
15.0000 mg/kg | Freq: Once | INTRAVENOUS | Status: AC
  Administered 2020-01-27: 10:00:00 900 mg via INTRAVENOUS
  Filled 2020-01-27: qty 32

## 2020-01-27 MED ORDER — PALONOSETRON HCL INJECTION 0.25 MG/5ML
0.2500 mg | Freq: Once | INTRAVENOUS | Status: AC
  Administered 2020-01-27: 09:00:00 0.25 mg via INTRAVENOUS

## 2020-01-27 MED ORDER — SODIUM CHLORIDE 0.9 % IV SOLN
10.0000 mg | Freq: Once | INTRAVENOUS | Status: AC
  Administered 2020-01-27: 09:00:00 10 mg via INTRAVENOUS
  Filled 2020-01-27: qty 10

## 2020-01-27 MED ORDER — HEPARIN SOD (PORK) LOCK FLUSH 100 UNIT/ML IV SOLN
500.0000 [IU] | Freq: Once | INTRAVENOUS | Status: AC | PRN
  Administered 2020-01-27: 13:00:00 500 [IU]
  Filled 2020-01-27: qty 5

## 2020-01-27 MED ORDER — SODIUM CHLORIDE 0.9 % IV SOLN
67.5000 mg/m2 | Freq: Once | INTRAVENOUS | Status: AC
  Administered 2020-01-27: 11:00:00 110 mg via INTRAVENOUS
  Filled 2020-01-27: qty 11

## 2020-01-27 MED ORDER — PALONOSETRON HCL INJECTION 0.25 MG/5ML
INTRAVENOUS | Status: AC
  Filled 2020-01-27: qty 5

## 2020-01-27 MED ORDER — CARBOPLATIN CHEMO INJECTION 450 MG/45ML
334.5000 mg | Freq: Once | INTRAVENOUS | Status: AC
  Administered 2020-01-27: 13:00:00 330 mg via INTRAVENOUS
  Filled 2020-01-27: qty 33

## 2020-01-27 MED ORDER — SODIUM CHLORIDE 0.9% FLUSH
10.0000 mL | INTRAVENOUS | Status: DC | PRN
  Administered 2020-01-27: 13:00:00 10 mL
  Filled 2020-01-27: qty 10

## 2020-01-27 NOTE — Progress Notes (Signed)
Oncology Nurse Navigator Documentation  Oncology Nurse Navigator Flowsheets 01/27/2020  Abnormal Finding Date -  Confirmed Diagnosis Date -  Phase of Treatment Chemo  Chemotherapy Actual Start Date: 01/27/2020  Navigator Follow Up Date: 01/28/2020  Navigator Follow Up Reason: Other:  Navigator Location [facility]  Navigator Encounter Type Treatment  Treatment Initiated Date 01/27/2020  Patient Visit Type MedOnc  Treatment Phase First Chemo Tx  Barriers/Navigation Needs Coordination of Care;Education  Education Other  Interventions Psycho-Social Support  Acuity Level 2-Minimal Needs (1-2 Barriers Identified)  Coordination of Care -  Education Method Verbal  Support Groups/Services Friends and Family  Time Spent with Patient 30

## 2020-01-27 NOTE — Patient Instructions (Signed)
[facility] Discharge Instructions for Patients Receiving Chemotherapy  Today you received the following chemotherapy agents Taxotere, Carboplatin and Bevacizumab-xxxx  To help prevent nausea and vomiting after your treatment, we encourage you to take your nausea medication as prescribed by MD. **DO NOT TAKE ZOFRAN FOR 3 DAYS AFTER CHEMOTHERAPY**   If you develop nausea and vomiting that is not controlled by your nausea medication, call the clinic.   BELOW ARE SYMPTOMS THAT SHOULD BE REPORTED IMMEDIATELY:  *FEVER GREATER THAN 100.5 F  *CHILLS WITH OR WITHOUT FEVER  NAUSEA AND VOMITING THAT IS NOT CONTROLLED WITH YOUR NAUSEA MEDICATION  *UNUSUAL SHORTNESS OF BREATH  *UNUSUAL BRUISING OR BLEEDING  TENDERNESS IN MOUTH AND THROAT WITH OR WITHOUT PRESENCE OF ULCERS  *URINARY PROBLEMS  *BOWEL PROBLEMS  UNUSUAL RASH Items with * indicate a potential emergency and should be followed up as soon as possible.  Feel free to call the clinic should you have any questions or concerns. The clinic phone number is [phone].  Please show the Katie [name] at check-in to the Emergency Department and triage nurse.

## 2020-01-27 NOTE — Patient Instructions (Signed)

## 2020-01-28 ENCOUNTER — Other Ambulatory Visit: Payer: Self-pay

## 2020-01-28 ENCOUNTER — Encounter: Payer: Self-pay | Admitting: *Deleted

## 2020-01-28 ENCOUNTER — Inpatient Hospital Stay: Payer: Medicare Other

## 2020-01-28 VITALS — BP 158/84 | HR 89 | Temp 98.1°F | Resp 18

## 2020-01-28 DIAGNOSIS — Z5189 Encounter for other specified aftercare: Secondary | ICD-10-CM | POA: Diagnosis not present

## 2020-01-28 DIAGNOSIS — G629 Polyneuropathy, unspecified: Secondary | ICD-10-CM | POA: Diagnosis not present

## 2020-01-28 DIAGNOSIS — J90 Pleural effusion, not elsewhere classified: Secondary | ICD-10-CM | POA: Diagnosis not present

## 2020-01-28 DIAGNOSIS — C7800 Secondary malignant neoplasm of unspecified lung: Secondary | ICD-10-CM | POA: Diagnosis not present

## 2020-01-28 DIAGNOSIS — C541 Malignant neoplasm of endometrium: Secondary | ICD-10-CM | POA: Diagnosis not present

## 2020-01-28 DIAGNOSIS — Z5111 Encounter for antineoplastic chemotherapy: Secondary | ICD-10-CM | POA: Diagnosis not present

## 2020-01-28 MED ORDER — PEGFILGRASTIM-JMDB 6 MG/0.6ML ~~LOC~~ SOSY
6.0000 mg | PREFILLED_SYRINGE | Freq: Once | SUBCUTANEOUS | Status: AC
  Administered 2020-01-28: 16:00:00 6 mg via SUBCUTANEOUS

## 2020-01-28 NOTE — Progress Notes (Signed)
Reviewed pt symptoms with Dr. Marin Olp and no new orders received. Pt advised to do mouth rinses and to monitor herself for symptoms of a cold. Pt aware to call with any fevers or concerns. Pt verbalized understanding and had no further questions.  Pt discharged in stable condition.

## 2020-01-28 NOTE — Progress Notes (Signed)
Spoke with patient after her first treatment yesterday. She states she is doing well. She slept well last night after a poor nights sleep the night before. She is eating and drinking normally. No n/v. She had a normal BM today. She has all her prn medication at home to use as needed. Reviewed prn medication, and the on-call service for after hours needs.   She does state when she woke up this morning she was hoarse. Her voice is audibly hoarse over the phone. She also occasionally coughs to clear her throat, but she denies congestion. She will be coming into the office this afternoon for her injection. We will assess this symptom then. Message sent to her nurse for this afternoon.  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

## 2020-01-28 NOTE — Patient Instructions (Signed)

## 2020-01-28 NOTE — Progress Notes (Signed)
Patient informed Nurse navigator this morning that she had hoarseness start after her infusion yesterday. Patient in for her injection appt today, she is afebrile, denies any other symptoms. States she doesn't think it is from her chronic cough. Discussed with pharmacist, it is a possible reaction of avastin, RN/pharamcist will discuss with Katie Griffes, NP for further advise moving forward.

## 2020-01-29 DIAGNOSIS — C541 Malignant neoplasm of endometrium: Secondary | ICD-10-CM | POA: Diagnosis not present

## 2020-02-01 ENCOUNTER — Telehealth: Payer: Self-pay | Admitting: *Deleted

## 2020-02-01 ENCOUNTER — Other Ambulatory Visit: Payer: Self-pay | Admitting: Hematology & Oncology

## 2020-02-01 ENCOUNTER — Other Ambulatory Visit: Payer: Self-pay | Admitting: *Deleted

## 2020-02-01 DIAGNOSIS — C541 Malignant neoplasm of endometrium: Secondary | ICD-10-CM

## 2020-02-01 DIAGNOSIS — K1379 Other lesions of oral mucosa: Secondary | ICD-10-CM

## 2020-02-01 MED ORDER — FAMCICLOVIR 500 MG PO TABS: 500.0000 mg | ORAL_TABLET | Freq: Every day | ORAL | 3 refills | Status: AC

## 2020-02-01 MED ORDER — MAGIC MOUTHWASH W/LIDOCAINE: 5.0000 mL | Freq: Four times a day (QID) | ORAL | 2 refills | Status: DC | PRN

## 2020-02-01 MED ORDER — DOCUSATE SODIUM 100 MG PO CAPS: 100.0000 mg | ORAL_CAPSULE | Freq: Every day | ORAL | 0 refills | Status: AC

## 2020-02-01 NOTE — Telephone Encounter (Signed)
Returned [employer] phone call regarding her mom. She stated,"Mom had a scratchy throat yesterday, and today it is sore. No fever. Temperature is 98.2. Has mouth pain and a sore/blister on the left side of her tongue. There are no white patches in her mouth. Also, she has not had a bowel movement since last Thursday. She got chemotherapy on Tuesday, December 22nd (Taxotere, Carboplatin, and Avastin). Bobbi gave her a dose of Miralax and prune juice in her coffee this morning. Per Dr. Marin Olp, I called in [place], Famvir and Magic Mouthwash. Bobbi voiced understanding of conversation.

## 2020-02-02 ENCOUNTER — Other Ambulatory Visit: Payer: Self-pay | Admitting: Hematology & Oncology

## 2020-02-03 ENCOUNTER — Ambulatory Visit: Payer: Medicare Other | Admitting: Nurse Practitioner

## 2020-02-06 ENCOUNTER — Other Ambulatory Visit: Payer: Self-pay

## 2020-02-06 ENCOUNTER — Inpatient Hospital Stay (HOSPITAL_COMMUNITY)
Admission: EM | Admit: 2020-02-06 | Discharge: 2020-02-09 | DRG: 153 | Disposition: A | Discharge status: Home / Self Care | Payer: Medicare Other | Attending: Family Medicine | Admitting: Family Medicine

## 2020-02-06 ENCOUNTER — Emergency Department (HOSPITAL_COMMUNITY): Payer: Medicare Other

## 2020-02-06 ENCOUNTER — Encounter (HOSPITAL_COMMUNITY): Payer: Self-pay | Admitting: Emergency Medicine

## 2020-02-06 DIAGNOSIS — R509 Fever, unspecified: Secondary | ICD-10-CM | POA: Diagnosis not present

## 2020-02-06 DIAGNOSIS — Z20822 Contact with and (suspected) exposure to covid-19: Secondary | ICD-10-CM | POA: Diagnosis present

## 2020-02-06 DIAGNOSIS — Z66 Do not resuscitate: Secondary | ICD-10-CM | POA: Diagnosis present

## 2020-02-06 DIAGNOSIS — C7800 Secondary malignant neoplasm of unspecified lung: Secondary | ICD-10-CM | POA: Diagnosis present

## 2020-02-06 DIAGNOSIS — H409 Unspecified glaucoma: Secondary | ICD-10-CM | POA: Diagnosis present

## 2020-02-06 DIAGNOSIS — E222 Syndrome of inappropriate secretion of antidiuretic hormone: Secondary | ICD-10-CM | POA: Diagnosis present

## 2020-02-06 DIAGNOSIS — R600 Localized edema: Secondary | ICD-10-CM | POA: Diagnosis present

## 2020-02-06 DIAGNOSIS — J040 Acute laryngitis: Secondary | ICD-10-CM | POA: Diagnosis not present

## 2020-02-06 DIAGNOSIS — Z8542 Personal history of malignant neoplasm of other parts of uterus: Secondary | ICD-10-CM

## 2020-02-06 DIAGNOSIS — E876 Hypokalemia: Secondary | ICD-10-CM | POA: Diagnosis present

## 2020-02-06 DIAGNOSIS — I10 Essential (primary) hypertension: Secondary | ICD-10-CM | POA: Diagnosis present

## 2020-02-06 DIAGNOSIS — A419 Sepsis, unspecified organism: Secondary | ICD-10-CM | POA: Diagnosis present

## 2020-02-06 DIAGNOSIS — G629 Polyneuropathy, unspecified: Secondary | ICD-10-CM | POA: Diagnosis present

## 2020-02-06 DIAGNOSIS — J90 Pleural effusion, not elsewhere classified: Secondary | ICD-10-CM | POA: Diagnosis not present

## 2020-02-06 DIAGNOSIS — Z809 Family history of malignant neoplasm, unspecified: Secondary | ICD-10-CM

## 2020-02-06 DIAGNOSIS — Z79899 Other long term (current) drug therapy: Secondary | ICD-10-CM

## 2020-02-06 DIAGNOSIS — R197 Diarrhea, unspecified: Secondary | ICD-10-CM | POA: Diagnosis not present

## 2020-02-06 DIAGNOSIS — R6510 Systemic inflammatory response syndrome (SIRS) of non-infectious origin without acute organ dysfunction: Secondary | ICD-10-CM | POA: Diagnosis not present

## 2020-02-06 DIAGNOSIS — J9811 Atelectasis: Secondary | ICD-10-CM | POA: Diagnosis not present

## 2020-02-06 DIAGNOSIS — C541 Malignant neoplasm of endometrium: Secondary | ICD-10-CM | POA: Diagnosis present

## 2020-02-06 DIAGNOSIS — E860 Dehydration: Secondary | ICD-10-CM | POA: Diagnosis present

## 2020-02-06 DIAGNOSIS — D72829 Elevated white blood cell count, unspecified: Secondary | ICD-10-CM | POA: Diagnosis not present

## 2020-02-06 DIAGNOSIS — E039 Hypothyroidism, unspecified: Secondary | ICD-10-CM | POA: Diagnosis present

## 2020-02-06 DIAGNOSIS — Z8249 Family history of ischemic heart disease and other diseases of the circulatory system: Secondary | ICD-10-CM

## 2020-02-06 DIAGNOSIS — Z79890 Hormone replacement therapy (postmenopausal): Secondary | ICD-10-CM

## 2020-02-06 DIAGNOSIS — Z8261 Family history of arthritis: Secondary | ICD-10-CM

## 2020-02-06 DIAGNOSIS — K219 Gastro-esophageal reflux disease without esophagitis: Secondary | ICD-10-CM | POA: Diagnosis present

## 2020-02-06 DIAGNOSIS — K529 Noninfective gastroenteritis and colitis, unspecified: Secondary | ICD-10-CM | POA: Diagnosis present

## 2020-02-06 LAB — COMPREHENSIVE METABOLIC PANEL
ALT: 16 U/L (ref 0–44)
AST: 19 U/L (ref 15–41)
Albumin: 2.9 g/dL — ABNORMAL LOW (ref 3.5–5.0)
Alkaline Phosphatase: 71 U/L (ref 38–126)
Anion gap: 11 (ref 5–15)
BUN: 10 mg/dL (ref 8–23)
CO2: 21 mmol/L — ABNORMAL LOW (ref 22–32)
Calcium: 8.9 mg/dL (ref 8.9–10.3)
Chloride: 97 mmol/L — ABNORMAL LOW (ref 98–111)
Creatinine, Ser: 0.56 mg/dL (ref 0.44–1.00)
GFR, Estimated: >60 mL/min (ref >60)
Glucose, Bld: 126 mg/dL — ABNORMAL HIGH (ref 70–99)
Potassium: 3.6 mmol/L (ref 3.5–5.1)
Sodium: 129 mmol/L — ABNORMAL LOW (ref 135–145)
Total Bilirubin: 0.5 mg/dL (ref 0.3–1.2)
Total Protein: 6.5 g/dL (ref 6.5–8.1)

## 2020-02-06 LAB — LIPASE, BLOOD: Lipase: 30 U/L (ref 11–51)

## 2020-02-06 LAB — CBC WITH DIFFERENTIAL/PLATELET
Abs Immature Granulocytes: 1.31 10*3/uL — ABNORMAL HIGH (ref 0.00–0.07)
Basophils Absolute: 0.1 10*3/uL (ref 0.0–0.1)
Basophils Relative: 1 %
Eosinophils Absolute: 0 10*3/uL (ref 0.0–0.5)
Eosinophils Relative: 0 %
HCT: 33.5 % — ABNORMAL LOW (ref 36.0–46.0)
Hemoglobin: 10.9 g/dL — ABNORMAL LOW (ref 12.0–15.0)
Immature Granulocytes: 7 %
Lymphocytes Relative: 5 %
Lymphs Abs: 1 10*3/uL (ref 0.7–4.0)
MCH: 29.1 pg (ref 26.0–34.0)
MCHC: 32.5 g/dL (ref 30.0–36.0)
MCV: 89.3 fL (ref 80.0–100.0)
Monocytes Absolute: 1.2 10*3/uL — ABNORMAL HIGH (ref 0.1–1.0)
Monocytes Relative: 6 %
Neutro Abs: 15.8 10*3/uL — ABNORMAL HIGH (ref 1.7–7.7)
Neutrophils Relative %: 81 %
Platelets: 176 10*3/uL (ref 150–400)
RBC: 3.75 MIL/uL — ABNORMAL LOW (ref 3.87–5.11)
RDW: 17.2 % — ABNORMAL HIGH (ref 11.5–15.5)
WBC: 19.4 10*3/uL — ABNORMAL HIGH (ref 4.0–10.5)
nRBC: 0.1 % (ref 0.0–0.2)

## 2020-02-06 LAB — URINALYSIS, ROUTINE W REFLEX MICROSCOPIC
Bilirubin Urine: NEGATIVE
Glucose, UA: NEGATIVE mg/dL
Hgb urine dipstick: NEGATIVE
Ketones, ur: NEGATIVE mg/dL
Leukocytes,Ua: NEGATIVE
Nitrite: NEGATIVE
Protein, ur: 30 mg/dL — AB
Specific Gravity, Urine: 1.024 (ref 1.005–1.030)
pH: 5 (ref 5.0–8.0)

## 2020-02-06 LAB — RESP PANEL BY RT-PCR (FLU A&B, COVID) ARPGX2
Influenza A by PCR: NEGATIVE
Influenza B by PCR: NEGATIVE
SARS Coronavirus 2 by RT PCR: NEGATIVE

## 2020-02-06 LAB — LACTIC ACID, PLASMA: Lactic Acid, Venous: 1.2 mmol/L (ref 0.5–1.9)

## 2020-02-06 MED ORDER — CHOLESTYRAMINE 4 G PO PACK
4.0000 g | PACK | Freq: Every day | ORAL | Status: DC
  Filled 2020-02-06 (×2): qty 1

## 2020-02-06 MED ORDER — VITAMIN B-12 1000 MCG PO TABS
1000.0000 ug | ORAL_TABLET | Freq: Every day | ORAL | Status: DC
  Administered 2020-02-07 – 2020-02-09 (×3): 1000 ug via ORAL
  Filled 2020-02-06 (×3): qty 1

## 2020-02-06 MED ORDER — ACETAMINOPHEN 325 MG PO TABS
650.0000 mg | ORAL_TABLET | Freq: Four times a day (QID) | ORAL | Status: DC | PRN
  Administered 2020-02-08: 650 mg via ORAL
  Filled 2020-02-06: qty 2

## 2020-02-06 MED ORDER — ENOXAPARIN SODIUM 40 MG/0.4ML ~~LOC~~ SOLN
40.0000 mg | SUBCUTANEOUS | Status: DC
  Administered 2020-02-07 – 2020-02-08 (×2): 40 mg via SUBCUTANEOUS
  Filled 2020-02-06 (×3): qty 0.4

## 2020-02-06 MED ORDER — ACETAMINOPHEN 325 MG PO TABS
650.0000 mg | ORAL_TABLET | Freq: Once | ORAL | Status: AC
  Administered 2020-02-06: 650 mg via ORAL
  Filled 2020-02-06: qty 2

## 2020-02-06 MED ORDER — SODIUM CHLORIDE 0.9 % IV SOLN
2.0000 g | Freq: Two times a day (BID) | INTRAVENOUS | Status: DC
  Administered 2020-02-07 – 2020-02-08 (×3): 2 g via INTRAVENOUS
  Filled 2020-02-06 (×4): qty 2

## 2020-02-06 MED ORDER — LEVOTHYROXINE SODIUM 50 MCG PO TABS
75.0000 ug | ORAL_TABLET | Freq: Every day | ORAL | Status: DC
  Administered 2020-02-07 – 2020-02-09 (×3): 75 ug via ORAL
  Filled 2020-02-06 (×3): qty 1

## 2020-02-06 MED ORDER — SODIUM CHLORIDE 0.9 % IV SOLN
2.0000 g | Freq: Two times a day (BID) | INTRAVENOUS | Status: DC
  Administered 2020-02-06: 2 g via INTRAVENOUS
  Filled 2020-02-06: qty 2

## 2020-02-06 MED ORDER — SODIUM CHLORIDE 0.9 % IV SOLN: Freq: Once | INTRAVENOUS | Status: AC

## 2020-02-06 MED ORDER — FAMCICLOVIR 500 MG PO TABS
500.0000 mg | ORAL_TABLET | Freq: Every day | ORAL | Status: DC
  Administered 2020-02-07 – 2020-02-09 (×3): 500 mg via ORAL
  Filled 2020-02-06 (×3): qty 1

## 2020-02-06 MED ORDER — PREDNISONE 50 MG PO TABS: 50.0000 mg | ORAL_TABLET | Freq: Once | ORAL | Status: DC

## 2020-02-06 MED ORDER — LACTATED RINGERS IV BOLUS
1000.0000 mL | Freq: Once | INTRAVENOUS | Status: AC
  Administered 2020-02-06: 1000 mL via INTRAVENOUS

## 2020-02-06 MED ORDER — PREDNISONE 20 MG PO TABS
40.0000 mg | ORAL_TABLET | Freq: Once | ORAL | Status: AC
  Administered 2020-02-06: 40 mg via ORAL
  Filled 2020-02-06: qty 2

## 2020-02-06 MED ORDER — MAGIC MOUTHWASH W/LIDOCAINE
5.0000 mL | Freq: Four times a day (QID) | ORAL | Status: DC
  Administered 2020-02-06 – 2020-02-07 (×5): 5 mL via ORAL
  Filled 2020-02-06 (×7): qty 5

## 2020-02-06 MED ORDER — BRIMONIDINE TARTRATE 0.15 % OP SOLN
1.0000 [drp] | Freq: Two times a day (BID) | OPHTHALMIC | Status: DC
  Administered 2020-02-06 – 2020-02-07 (×2): 1 [drp] via OPHTHALMIC
  Filled 2020-02-06: qty 5

## 2020-02-06 MED ORDER — MEGESTROL ACETATE 40 MG PO TABS
160.0000 mg | ORAL_TABLET | Freq: Every day | ORAL | Status: DC
  Administered 2020-02-07 – 2020-02-09 (×3): 160 mg via ORAL
  Filled 2020-02-06 (×3): qty 4

## 2020-02-06 MED ORDER — METOPROLOL TARTRATE 25 MG PO TABS
12.5000 mg | ORAL_TABLET | Freq: Two times a day (BID) | ORAL | Status: DC
  Administered 2020-02-06 – 2020-02-09 (×5): 12.5 mg via ORAL
  Filled 2020-02-06 (×6): qty 1

## 2020-02-06 MED ORDER — ALBUTEROL SULFATE (2.5 MG/3ML) 0.083% IN NEBU: 2.5000 mg | INHALATION_SOLUTION | RESPIRATORY_TRACT | Status: DC | PRN

## 2020-02-06 MED ORDER — PANTOPRAZOLE SODIUM 40 MG PO TBEC
40.0000 mg | DELAYED_RELEASE_TABLET | Freq: Every day | ORAL | Status: DC
  Administered 2020-02-07 – 2020-02-09 (×3): 40 mg via ORAL
  Filled 2020-02-06 (×3): qty 1

## 2020-02-06 MED ORDER — LATANOPROST 0.005 % OP SOLN
1.0000 [drp] | Freq: Every day | OPHTHALMIC | Status: DC
  Administered 2020-02-06 – 2020-02-07 (×2): 1 [drp] via OPHTHALMIC
  Filled 2020-02-06: qty 2.5

## 2020-02-06 MED ORDER — ONDANSETRON HCL 4 MG PO TABS: 4.0000 mg | ORAL_TABLET | Freq: Four times a day (QID) | ORAL | Status: DC | PRN

## 2020-02-06 MED ORDER — SODIUM CHLORIDE 0.9 % IV SOLN
INTRAVENOUS | Status: DC
  Administered 2020-02-07 – 2020-02-08 (×2): 1000 mL via INTRAVENOUS

## 2020-02-06 MED ORDER — ACETAMINOPHEN 650 MG RE SUPP: 650.0000 mg | Freq: Four times a day (QID) | RECTAL | Status: DC | PRN

## 2020-02-06 MED ORDER — ONDANSETRON HCL 4 MG/2ML IJ SOLN: 4.0000 mg | Freq: Four times a day (QID) | INTRAMUSCULAR | Status: DC | PRN

## 2020-02-06 MED ORDER — SODIUM CHLORIDE 0.9 % IV SOLN
2.0000 g | Freq: Once | INTRAVENOUS | Status: AC
  Administered 2020-02-06: 2 g via INTRAVENOUS
  Filled 2020-02-06: qty 2

## 2020-02-06 NOTE — ED Provider Notes (Signed)
[place] DEPT Provider Note   CSN: [id number] Arrival date & time: 02/06/20  1629     History Chief Complaint  Patient presents with  . Emesis  . Nausea  . Chills    Katie [name] is a 80 y.o. female.  Presents to ER with concern for fever, chills, diarrhea.  History of endometrial cancer, currently on chemotherapy.  Chemo was given on December 22.  Had some suspected side effects from chemo but initially was doing okay.  Was constipated then developed diarrhea over the past few days.  Had chills and then had fever up to 100.7 this morning.  Multiple episodes.  No blood.  Watery.  No associated abdominal pain.  Has had cough as well.  Nonproductive.  No shortness of breath.  HPI     Past Medical History:  Diagnosis Date  . Arthritis   . Cancer [facility])    Endometrial  . Complication of anesthesia    very hard to wake up from Anesthesia- then when wakes up feels like she is going to faint or have vomiting  . Endometrial cancer [facility])    Per [facility] New Patient Packet   . Endometrial cancer ([place]) 12/18/2019  . Family history of adverse reaction to anesthesia    daughter has a hard time waking up from Anesthesia  . GERD (gastroesophageal reflux disease)   . Glaucoma   . Goals of care, counseling/discussion 12/18/2019  . History of fainting 2021   Per [place] Patient Packet   . Hypothyroidism   . 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 PONV (postoperative nausea and vomiting)   . Thyroid disease     Patient Active Problem List   Diagnosis Date Noted  . Fever, unknown origin 02/06/2020  . Aortic atherosclerosis ([place]) 01/08/2020  . Endometrial cancer ([place]) 12/18/2019  . 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 Goals of care, counseling/discussion 12/18/2019  . Gastric polyp   . Dysphagia 12/15/2019  . Volume depletion 12/15/2019  .  Generalized weakness 12/14/2019  . PVD (peripheral vascular disease) ([place]) 11/05/2019  . Syncope and collapse 06/27/2019  . Reactive airway disease 06/15/2019  . Muscle cramps 06/15/2019  . Essential hypertension 06/15/2019  . Elevated BP without diagnosis of hypertension 05/29/2019  . Stress and adjustment reaction 05/29/2019  . Iron deficiency 02/23/2019  . B12 deficiency 02/23/2019  . Hospital discharge follow-up 02/20/2019  . Anemia 02/20/2019  . 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 Vitamin D deficiency 09/15/2018  . Pain due to onychomycosis of toenails of both feet 08/05/2018  . Osteoporosis without current pathological fracture 03/17/2018  . Healthcare maintenance 03/17/2018  . Elevated LDL cholesterol level 03/17/2018  . History of total hip arthroplasty, left 01/23/2018  . Cellulitis of right upper extremity 09/26/2017  . Left flank pain 08/26/2017  . OA (osteoarthritis) of knee 06/03/2017  . Loose stools 05/30/2017  . Chronic diarrhea 05/30/2017  . 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 Osteoarthritis of right knee 05/27/2017  . Labyrinthitis 04/18/2017  . Need for assistance due to unsteady gait 04/18/2017  . Age-related osteoporosis without current pathological fracture 01/23/2017  . Acquired hypothyroidism 01/23/2017    Past Surgical History:  Procedure Laterality Date  . ABDOMINAL HYSTERECTOMY    . APPENDECTOMY  1958  . BIOPSY  12/16/2019   Procedure: BIOPSY;  Surgeon: Thornton Park, MD;  Location: [facility] ENDOSCOPY;  Service: Gastroenterology;;  .  BREAST EXCISIONAL BIOPSY Right    benign, was a cyst  . BREAST SURGERY Left    lumpectomy in 1989  . BRONCHIAL BIOPSY  12/22/2019   Procedure: BRONCHIAL BIOPSIES;  Surgeon: Marshell Garfinkel, MD;  Location: [facility] ENDOSCOPY;  Service: Cardiopulmonary;;  . BRONCHIAL BRUSHINGS  12/22/2019   Procedure: BRONCHIAL BRUSHINGS;  Surgeon: Marshell Garfinkel, MD;  Location: [facility] ENDOS[facility];  Service: Cardiopulmonary;;   . CATARACT EXTRACTION  04/29/2019  . CATARACT EXTRACTION Right 09/2019  . CHOLECYSTECTOMY    . ENDOBRONCHIAL ULTRASOUND Bilateral 12/22/2019   Procedure: ENDOBRONCHIAL ULTRASOUND;  Surgeon: Marshell Garfinkel, MD;  Location: [facility];  Service: Cardiopulmonary;  Laterality: Bilateral;  . ESOPHAGOGASTRODUODENOSCOPY (EGD) WITH PROPOFOL N/A 12/16/2019   Procedure: ESOPHAGOGASTRODUODENOSCOPY (EGD) WITH PROPOFOL;  Surgeon: Thornton Park, MD;  Location: [facility];  Service: Gastroenterology;  Laterality: N/A;  likely with dilation  . FEMUR SURGERY  06/2013   Dr.Suarez   . IR IMAGING GUIDED PORT INSERTION  12/21/2019  . ROTATOR CUFF REPAIR  2003   Per [place] Patient Packet, Dr. Gwenlyn Perking   . TOTAL KNEE ARTHROPLASTY Right 01/05/2019   Procedure: TOTAL KNEE ARTHROPLASTY;  Surgeon: Gaynelle Arabian, MD;  Location: [facility] ORS;  Service: Orthopedics;  Laterality: Right;  45min  . VIDEO BRONCHOSCOPY  12/22/2019   Procedure: VIDEO BRONCHOSCOPY;  Surgeon: Marshell Garfinkel, MD;  Location: [facility];  Service: Cardiopulmonary;;     OB History    Gravida  2   Para  2   Term  2   Preterm      AB      Living  2     SAB      IAB      Ectopic      Multiple      Live Births  2           Family History  Problem Relation Age of Onset  . Arthritis Mother   . Osteoporosis Mother   . Heart disease Father   . Hearing loss Maternal Grandmother   . Hearing loss Maternal Grandfather   . Cancer Paternal Grandfather   . Cancer Sister     Social History   Tobacco Use  . Smoking status: Never Smoker  . Smokeless tobacco: Never Used  Vaping Use  . Vaping Use: Never used  Substance Use Topics  . Alcohol use: No  . Drug use: No    Home Medications Prior to Admission medications   Medication Sig Start Date End Date Taking? Authorizing Provider  acetaminophen (TYLENOL) 500 MG tablet Take 1,000 mg by mouth daily. At bedtime.   Yes [provider]  bimatoprost (LUMIGAN)  0.01 % SOLN Place 1 drop into both eyes at bedtime. Can not take Generic   Yes [provider]  brimonidine (ALPHAGAN P) 0.1 % SOLN Place 1 drop into both eyes 2 (two) times daily. Can not take generic   Yes [provider]  cholecalciferol (VITAMIN D) 25 MCG (1000 UNIT) tablet Take 3,000 Units by mouth daily.    Yes [provider]  cholestyramine (QUESTRAN) 4 g packet Take 4 g by mouth as directed. Take 1 packet at bedtime but can Take daily as needed for constipation   Yes [provider]  docusate sodium (COLACE) 100 MG capsule Take 1 capsule (100 mg total) by mouth daily. Patient taking differently: Take 100 mg by mouth daily as needed for mild constipation. 02/01/20  Yes Volanda Napoleon, MD  esomeprazole (NEXIUM) 20 MG capsule Take 20  mg by mouth daily at 12 noon.   Yes [provider]  famciclovir (FAMVIR) 500 MG tablet Take 1 tablet (500 mg total) by mouth daily. 02/01/20  Yes Volanda Napoleon, MD  levothyroxine (SYNTHROID) 75 MCG tablet TAKE 1 TABLET(75 MCG) BY MOUTH DAILY Patient taking differently: Take 75 mcg by mouth daily before breakfast. 03/09/19  Yes Libby Maw, MD  loratadine (CLARITIN) 10 MG tablet Take 10 mg by mouth daily.   Yes [provider]  magic mouthwash w/lidocaine SOLN Take 5 mLs by mouth 4 (four) times daily as needed for mouth pain. 02/01/20  Yes Volanda Napoleon, MD  megestrol (MEGACE) 40 MG tablet Take 4 tablets (160 mg total) by mouth daily. 12/28/19  Yes Volanda Napoleon, MD  metoprolol tartrate (LOPRESSOR) 25 MG tablet Take 0.5 tablets (12.5 mg total) by mouth 2 (two) times daily. 12/24/19  Yes Shelly Coss, MD  ondansetron (ZOFRAN) 8 MG tablet Take 1 tablet (8 mg total) by mouth 2 (two) times daily as needed for refractory nausea / vomiting. Start on day 3 after chemo. 01/25/20  Yes Volanda Napoleon, MD  prochlorperazine (COMPAZINE) 10 MG tablet Take 1 tablet (10 mg total) by mouth every 6 (six)  hours as needed (Nausea or vomiting). 01/25/20  Yes Ennever, Rudell Cobb, MD  vitamin B-12 (CYANOCOBALAMIN) 1000 MCG tablet Take 1,000 mcg by mouth daily.   Yes [provider]  dexamethasone (DECADRON) 4 MG tablet Take 2 tablets (8 mg total) by mouth 2 (two) times daily. Take decadron  Twice daily the day before taxotere then daily x 3 days after carboplatin  chemo 01/25/20   Volanda Napoleon, MD    Allergies    Sulfa antibiotics, Gabapentin, Lyrica [pregabalin], and Paxil [paroxetine]  Review of Systems   Review of Systems  Constitutional: Positive for chills, fatigue and fever.  HENT: Negative for ear pain and sore throat.   Eyes: Negative for pain and visual disturbance.  Respiratory: Positive for cough and shortness of breath.   Cardiovascular: Negative for chest pain and palpitations.  Gastrointestinal: Positive for diarrhea and nausea. Negative for abdominal pain and vomiting.  Genitourinary: Negative for dysuria and hematuria.  Musculoskeletal: Negative for arthralgias and back pain.  Skin: Negative for color change and rash.  Neurological: Negative for seizures and syncope.  All other systems reviewed and are negative.   Physical Exam Updated Vital Signs BP (!) 148/97 (BP Location: Left Arm)   Pulse 73   Temp 98 F (36.7 C) (Oral)   Resp 16   Wt 58.7 kg   SpO2 96%   BMI 25.27 kg/m   Physical Exam Vitals and nursing note reviewed.  Constitutional:      General: She is not in acute distress.    Appearance: She is well-developed and well-nourished.     Comments: Chronically ill appearing but no acute distress  HENT:     Head: Normocephalic and atraumatic.  Eyes:     Conjunctiva/sclera: Conjunctivae normal.  Cardiovascular:     Rate and Rhythm: Normal rate and regular rhythm.     Heart sounds: No murmur heard.   Pulmonary:     Effort: Pulmonary effort is normal. No respiratory distress.     Breath sounds: Normal breath sounds.  Abdominal:      Palpations: Abdomen is soft.     Tenderness: There is no abdominal tenderness.  Musculoskeletal:        General: No edema.     Cervical back:  Neck supple.  Skin:    General: Skin is warm and dry.     Capillary Refill: Capillary refill takes less than 2 seconds.  Neurological:     General: No focal deficit present.     Mental Status: She is alert.  Psychiatric:        Mood and Affect: Mood and affect and mood normal.        Behavior: Behavior normal.     ED Results / Procedures / Treatments   Labs (all labs ordered are listed, but only abnormal results are displayed) Labs Reviewed  CBC WITH DIFFERENTIAL/PLATELET - Abnormal; Notable for the following components:      Result Value   WBC 19.4 (*)    RBC 3.75 (*)    Hemoglobin 10.9 (*)    HCT 33.5 (*)    RDW 17.2 (*)    Neutro Abs 15.8 (*)    Monocytes Absolute 1.2 (*)    Abs Immature Granulocytes 1.31 (*)    All other components within normal limits  COMPREHENSIVE METABOLIC PANEL - Abnormal; Notable for the following components:   Sodium 129 (*)    Chloride 97 (*)    CO2 21 (*)    Glucose, Bld 126 (*)    Albumin 2.9 (*)    All other components within normal limits  URINALYSIS, ROUTINE W REFLEX MICROSCOPIC - Abnormal; Notable for the following components:   APPearance HAZY (*)    Protein, ur 30 (*)    Bacteria, UA RARE (*)    All other components within normal limits  RESP PANEL BY RT-PCR (FLU A&B, COVID) ARPGX2  CULTURE, BLOOD (ROUTINE X 2)  CULTURE, BLOOD (ROUTINE X 2)  URINE CULTURE  GASTROINTESTINAL PANEL BY PCR, STOOL (REPLACES STOOL CULTURE)  C DIFFICILE (CDIFF) QUICK SCRN (NO PCR REFLEX)  LACTIC ACID, PLASMA  LIPASE, BLOOD  COMPREHENSIVE METABOLIC PANEL  CBC    EKG None  Radiology DG Chest Portable 1 View  Result Date: 02/06/2020 CLINICAL DATA:  Fever EXAM: PORTABLE CHEST 1 VIEW COMPARISON:  01/20/2020 FINDINGS: Right Port-A-Cath remains in place, unchanged. Small right pleural effusion and right base  atelectasis or scarring, stable. Left lung clear. Heart is normal size. IMPRESSION: Small right pleural effusion and right base atelectasis, stable. No acute findings. Electronically Signed   By: Rolm Baptise M.D.   On: 02/06/2020 17:53    Procedures Procedures (including critical care time)  Medications Ordered in ED Medications  latanoprost (XALATAN) 0.005 % ophthalmic solution 1 drop (1 drop Both Eyes Given 02/06/20 2259)  brimonidine (ALPHAGAN) 0.15 % ophthalmic solution 1 drop (1 drop Both Eyes Given 02/06/20 2259)  cholestyramine (QUESTRAN) packet 4 g (4 g Oral Not Given 02/06/20 2301)  pantoprazole (PROTONIX) EC tablet 40 mg (has no administration in time range)  famciclovir [facility]) tablet 500 mg (has no administration in time range)  levothyroxine (SYNTHROID) tablet 75 mcg (has no administration in time range)  magic mouthwash w/lidocaine (has no administration in time range)  metoprolol tartrate (LOPRESSOR) tablet 12.5 mg (has no administration in time range)  vitamin B-12 (CYANOCOBALAMIN) tablet 1,000 mcg (has no administration in time range)  megestrol (MEGACE) tablet 160 mg (has no administration in time range)  enoxaparin (LOVENOX) injection 40 mg (has no administration in time range)  acetaminophen (TYLENOL) tablet 650 mg (has no administration in time range)    Or  acetaminophen (TYLENOL) suppository 650 mg (has no administration in time range)  ondansetron (ZOFRAN) tablet 4 mg (has no administration in  time range)    Or  ondansetron (ZOFRAN) injection 4 mg (has no administration in time range)  albuterol (PROVENTIL) (2.5 MG/3ML) 0.083% nebulizer solution 2.5 mg (has no administration in time range)  0.9 %  sodium chloride infusion ( Intravenous Duplicate 02/11/08 9604)  predniSONE (DELTASONE) tablet 40 mg (has no administration in time range)  ceFEPIme (MAXIPIME) 2 g in sodium chloride 0.9 % 100 mL IVPB (has no administration in time range)  ceFEPIme (MAXIPIME) 2 g in sodium  chloride 0.9 % 100 mL IVPB (0 g Intravenous Stopped 02/06/20 1909)  acetaminophen (TYLENOL) tablet 650 mg (650 mg Oral Given 02/06/20 1746)  lactated ringers bolus 1,000 mL (0 mLs Intravenous Stopped 02/06/20 2016)  0.9 %  sodium chloride infusion ( Intravenous Infusion Verify 02/06/20 2109)    ED Course  I have reviewed the triage vital signs and the nursing notes.  Pertinent labs & imaging results that were available during my care of the patient were reviewed by me and considered in my medical decision making (see chart for details).    MDM Rules/Calculators/A&P                         80 year old lady with history of metastatic endometrial cancer presented to ER with concern for fever, chills, diarrhea.  On exam, patient not in acute distress, noted low-grade fever.  Given recent chemotherapy, concern for possible neutropenic fever, obtain broad work-up and provided dose of empiric antibiotics.  Work-up notable for negative covid, negative UA, CXR negative for pneumonia.  Her labs were noted for leukocytosis, likely secondary to Neupogen but also could be related to current infection. Sent stool studies including c diff but unable to provide sample so far.  BMP with mild hyponatremia, mildly low bicarbonate.  Suspect dehydration.  Reviewed case with on-call oncology, Dr. Annamaria Boots.  She recommended observation admission given age, fever on chemo.  Discussed case with hospitalist, Dr. Posey Pronto will admit.  Final Clinical Impression(s) / ED Diagnoses Final diagnoses:  Fever, unspecified fever cause  Leukocytosis, unspecified type  Endometrial cancer [facility])    Rx / DC Orders ED Discharge Orders    None       Lucrezia Starch, MD 02/06/20 2308

## 2020-02-06 NOTE — Progress Notes (Signed)
Pharmacy Antibiotic Note  Katie [name] is a 80 y.o. female presented on 02/06/2020  with concern for fever, chills, diarrhea.  History of endometrial cancer, currently on chemotherapy  Pharmacy has been consulted to dose cefepime for sepsis. CrCl ~ 62mls/min  Plan: Cefepime 2gm IV q12h Follow renal function, cultures and clinical course  Weight: 58.7 kg (129 lb 6.6 oz)  Temp (24hrs), Avg:99.3 F (37.4 C), Min:98 F (36.7 C), Max:100.6 F (38.1 C)  Recent Labs  Lab 02/06/20 1810  WBC 19.4*  CREATININE 0.56  LATICACIDVEN 1.2    Estimated Creatinine Clearance: 44.2 mL/min (by C-G formula based on SCr of 0.56 mg/dL).    Allergies  Allergen Reactions  . Sulfa Antibiotics Rash  . Gabapentin Other (See Comments)    Depressed   . Lyrica [Pregabalin] Other (See Comments)    unknown  . Paxil [Paroxetine]     unknown      Thank you for allowing pharmacy to be a part of this patient's care.  Dolly Rias RPh 02/06/2020, 10:18 PM

## 2020-02-06 NOTE — H&P (Signed)
[facility] History and Physical   Patient: Katie [name] TIW:[id number]   PCP: [name], NP DOB: 04-Aug-1940   DOA: 02/06/2020   DOS: 02/06/2020   DOS: the patient was seen and examined on 02/06/2020  Patient coming from: The patient is coming from Home  Chief Complaint: Fever and hoarseness of voice  HPI: [name] is a 80 y.o. female with Past medical history of endometrial cancer with lung metastasis, malignant pleural effusion, GERD, hypothyroidism, neuropathy. Patient presents with complaints of cough and shortness of breath. Patient was given chemotherapy on December 22.  Also received Neulasta after that. Started to have some diarrhea over last few days. Had some fever and chills starting today as well as losing her voice with dry cough. Decided to come to the hospital today. No nausea no vomiting the time of my evaluation.  Denies any abdominal pain. Denies any headache. She tells me that she is losing her voice and this is not her normal voice. She noticed that since last few days. Shows reports that she has swelling of her legs which she has noticed lately and he has been told that this is secondary to her current cancer.  ED Course: Given IV cefepime.  EDP discussed with oncology and recommended medical admission.   Review of Systems: as mentioned in the history of present illness.  All other systems reviewed and are negative.  Past Medical History:  Diagnosis Date   Arthritis    Cancer [facility])    Endometrial   Complication of anesthesia    very hard to wake up from Anesthesia- then when wakes up feels like she is going to faint or have vomiting   Endometrial cancer [facility])    Per [facility] New Patient Packet    Endometrial cancer ([place]) 12/18/2019   Family history of adverse reaction to anesthesia    daughter has a hard time waking up from Anesthesia   GERD (gastroesophageal reflux disease)    Glaucoma    Goals of care, counseling/discussion  12/18/2019   History of fainting 2021   Per [facility] New Patient Packet    Hypothyroidism    Neuromuscular disorder ([place])    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 -endometrial cancer   Pleural effusion, malignant 12/18/2019   PONV (postoperative nausea and vomiting)    Thyroid disease    Past Surgical History:  Procedure Laterality Date   [place]   BIOPSY  12/16/2019   Procedure: BIOPSY;  Surgeon: Thornton Park, MD;  Location: [facility];  Service: Gastroenterology;;   BREAST EXCISIONAL BIOPSY Right    benign, was a cyst   BREAST SURGERY Left    lumpectomy in [place]  12/22/2019   Procedure: [place]  Surgeon: Marshell Garfinkel, MD;  Location: [facility] END[facility];  Service: Cardiopulmonary;;   BRONCHIAL BRUSHINGS  12/22/2019   Procedure: BRONCHIAL BRUSHINGS;  Surgeon: Marshell Garfinkel, MD;  [facility];  Service: Cardiopulmonary;;   CATARACT EXTRACTION  04/29/2019   CATARACT EXTRACTION Right 09/2019   CHOLECYSTECTOMY     ENDOBRONCHIAL ULTRASOUND Bilateral 12/22/2019   Procedure: ENDOBRONCHIAL ULTRASOUND;  Surgeon: Marshell Garfinkel, MD;  [facility];  Service: Cardiopulmonary;  Laterality: Bilateral;   ESOPHAGOGASTRODUODENOSCOPY (EGD) WITH PROPOFOL N/A 12/16/2019   Procedure: ESOPHAGOGASTRODUODENOSCOPY (EGD) WITH PROPOFOL;  Surgeon: Thornton Park, MD;  [facility];  Service: Gastroenterology;  Laterality: N/A;  likely with dilation   FEMUR SURGERY  06/2013   Dr.Suarez  IR IMAGING GUIDED PORT INSERTION  12/21/2019   ROTATOR CUFF REPAIR  2003   Per [place] New Patient Packet, Dr. Gwenlyn Perking    TOTAL KNEE ARTHROPLASTY Right 01/05/2019   Procedure: TOTAL KNEE ARTHROPLASTY;  Surgeon: Gaynelle Arabian, MD;  Location: [facility] ORS;  Service: Orthopedics;  Laterality: Right;  63min   VIDEO BRONCHOSCOPY  12/22/2019   Procedure: VIDEO BRONCHOSCOPY;  Surgeon: Marshell Garfinkel, MD;   Location[facility];  Service: Cardiopulmonary;;   Social History:  reports that she has never smoked. She has never used smokeless tobacco. She reports that she does not drink alcohol and does not use drugs.  Allergies  Allergen Reactions   Sulfa Antibiotics Rash   Gabapentin Other (See Comments)    Depressed    Lyrica [Pregabalin] Other (See Comments)    unknown   Paxil [Paroxetine]     unknown   Family history reviewed and not pertinent Family History  Problem Relation Age of Onset   Arthritis Mother    Osteoporosis Mother    Heart disease Father    Hearing loss Maternal Grandmother    Hearing loss Maternal Grandfather    Cancer Paternal Grandfather    Cancer Sister    Prior to Admission medications   Medication Sig Start Date End Date Taking? Authorizing Provider  acetaminophen (TYLENOL) 500 MG tablet Take 1,000 mg by mouth daily. At bedtime.   Yes [provider]  bimatoprost (LUMIGAN) 0.01 % SOLN Place 1 drop into both eyes at bedtime. Can not take Generic   Yes [provider]  brimonidine (ALPHAGAN P) 0.1 % SOLN Place 1 drop into both eyes 2 (two) times daily. Can not take generic   Yes [provider]  cholecalciferol (VITAMIN D) 25 MCG (1000 UNIT) tablet Take 3,000 Units by mouth daily.    Yes [provider]  cholestyramine (QUESTRAN) 4 g packet Take 4 g by mouth as directed. Take 1 packet at bedtime but can Take daily as needed for constipation   Yes [provider]  docusate sodium (COLACE) 100 MG capsule Take 1 capsule (100 mg total) by mouth daily. Patient taking differently: Take 100 mg by mouth daily as needed for mild constipation. 02/01/20  Yes Volanda Napoleon, MD  esomeprazole (NEXIUM) 20 MG capsule Take 20 mg by mouth daily at 12 noon.   Yes [provider]  famciclovir (FAMVIR) 500 MG tablet Take 1 tablet (500 mg total) by mouth daily. 02/01/20  Yes Volanda Napoleon, MD  levothyroxine  (SYNTHROID) 75 MCG tablet TAKE 1 TABLET(75 MCG) BY MOUTH DAILY Patient taking differently: Take 75 mcg by mouth daily before breakfast. 03/09/19  Yes Libby Maw, MD  loratadine (CLARITIN) 10 MG tablet Take 10 mg by mouth daily.   Yes [provider]  magic mouthwash w/lidocaine SOLN Take 5 mLs by mouth 4 (four) times daily as needed for mouth pain. 02/01/20  Yes Volanda Napoleon, MD  megestrol (MEGACE) 40 MG tablet Take 4 tablets (160 mg total) by mouth daily. 12/28/19  Yes Volanda Napoleon, MD  metoprolol tartrate (LOPRESSOR) 25 MG tablet Take 0.5 tablets (12.5 mg total) by mouth 2 (two) times daily. 12/24/19  Yes Shelly Coss, MD  ondansetron (ZOFRAN) 8 MG tablet Take 1 tablet (8 mg total) by mouth 2 (two) times daily as needed for refractory nausea / vomiting. Start on day 3 after chemo. 01/25/20  Yes Volanda Napoleon, MD  prochlorperazine (COMPAZINE) 10 MG tablet Take 1 tablet (10 mg  total) by mouth every 6 (six) hours as needed (Nausea or vomiting). 01/25/20  Yes Ennever, Rudell Cobb, MD  vitamin B-12 (CYANOCOBALAMIN) 1000 MCG tablet Take 1,000 mcg by mouth daily.   Yes [provider]  dexamethasone (DECADRON) 4 MG tablet Take 2 tablets (8 mg total) by mouth 2 (two) times daily. Take decadron  Twice daily the day before taxotere then daily x 3 days after carboplatin  chemo 01/25/20   Volanda Napoleon, MD    Physical Exam: Vitals:   02/06/20 1945 02/06/20 2000 02/06/20 2100 02/06/20 2131  BP:  (!) 154/102 (!) 157/93 (!) 148/97  Pulse: 88 85 84 73  Resp: (!) 29 (!) 28 20 16   Temp:    98 F (36.7 C)  TempSrc:    Oral  SpO2: 96% 96% 96% 96%  Weight:    58.7 kg    General: alert and oriented to time, place, and person. Appear in mild distress, affect appropriate Eyes: PERRL, Conjunctiva normal ENT: Oral Mucosa Clear, moist  Neck: no JVD, no Abnormal Mass Or lumps Cardiovascular: S1 and S2 Present, no Murmur, peripheral pulses symmetrical Respiratory:  increased respiratory effort, Bilateral Air entry equal and Decreased, no of accessory muscle use, right Crackles, right expiratory wheezes Abdomen: Bowel Sound present, Soft and no tenderness, no hernia Skin: no rashes  Extremities: bilateral  Pedal edema, no calf tenderness Neurologic: without any new focal findings Gait not checked due to patient safety concerns  Data Reviewed: I have personally reviewed and interpreted labs, imaging as discussed below.  CBC: Recent Labs  Lab 02/06/20 1810  WBC 19.4*  NEUTROABS 15.8*  HGB 10.9*  HCT 33.5*  MCV 89.3  PLT 425   Basic Metabolic Panel: Recent Labs  Lab 02/06/20 1810  NA 129*  K 3.6  CL 97*  CO2 21*  GLUCOSE 126*  BUN 10  CREATININE 0.56  CALCIUM 8.9   GFR: Estimated Creatinine Clearance: 44.2 mL/min (by C-G formula based on SCr of 0.56 mg/dL). Liver Function Tests: Recent Labs  Lab 02/06/20 1810  AST 19  ALT 16  ALKPHOS 71  BILITOT 0.5  PROT 6.5  ALBUMIN 2.9*   Recent Labs  Lab 02/06/20 1810  LIPASE 30   No results for input(s): AMMONIA in the last 168 hours. Coagulation Profile: No results for input(s): INR, PROTIME in the last 168 hours. Cardiac Enzymes: No results for input(s): CKTOTAL, CKMB, CKMBINDEX, TROPONINI in the last 168 hours. BNP (last 3 results) No results for input(s): PROBNP in the last 8760 hours. HbA1C: No results for input(s): HGBA1C in the last 72 hours. CBG: No results for input(s): GLUCAP in the last 168 hours. Lipid Profile: No results for input(s): CHOL, HDL, LDLCALC, TRIG, CHOLHDL, LDLDIRECT in the last 72 hours. Thyroid Function Tests: No results for input(s): TSH, T4TOTAL, FREET4, T3FREE, THYROIDAB in the last 72 hours. Anemia Panel: No results for input(s): VITAMINB12, FOLATE, FERRITIN, TIBC, IRON, RETICCTPCT in the last 72 hours. Urine analysis:    Component Value Date/Time   COLORURINE YELLOW 02/06/2020 1724   APPEARANCEUR HAZY (A) 02/06/2020 1724   LABSPEC 1.024  02/06/2020 1724   PHURINE 5.0 02/06/2020 1724   GLUCOSEU NEGATIVE 02/06/2020 1724   GLUCOSEU NEGATIVE 03/17/2018 1149   HGBUR NEGATIVE 02/06/2020 [place] 02/06/2020 1724   BILIRUBINUR Negative 11/19/2019 1413   KETONESUR NEGATIVE 02/06/2020 1724   PROTEINUR 30 (A) 02/06/2020 1724   UROBILINOGEN negative (A) 11/19/2019 1413   UROBILINOGEN 0.2 03/17/2018 1149  NITRITE NEGATIVE 02/06/2020 1724   LEUKOCYTESUR NEGATIVE 02/06/2020 1724    Radiological Exams on Admission: DG Chest Portable 1 View  Result Date: 02/06/2020 CLINICAL DATA:  Fever EXAM: PORTABLE CHEST 1 VIEW COMPARISON:  01/20/2020 FINDINGS: Right Port-A-Cath remains in place, unchanged. Small right pleural effusion and right base atelectasis or scarring, stable. Left lung clear. Heart is normal size. IMPRESSION: Small right pleural effusion and right base atelectasis, stable. No acute findings. Electronically Signed   By: Rolm Baptise M.D.   On: 02/06/2020 17:53   I reviewed all nursing notes, pharmacy notes, vitals, pertinent old records.  Assessment/Plan 1.  laryngitis SIRS Possibly viral. Meeting SIRS criteria on admission. Patient was started on IV cefepime given her chemotherapy status we will continue it for now protection follow-up on cultures. We will give 1 dose of steroids. Continue Magic mouthwash with lidocaine.  2.  Endometrial cancer with lung metastasis Chronic bilateral expiratory wheezes. Continue inhalers. Management per oncology outpatient.  3.  Bilateral edema. Check Doppler.  4.  Glaucoma. Continue eyedrops.  5.  Chronic diarrhea  patient is on Questran. C diff PCR has been ordered in the ER by EDP. Monitor results. Continue Questran.  6.  Hypothyroidism  continue Synthroid.  7.  Essential hypertension  continue home regimen.  Nutrition: Cardiac diet DVT Prophylaxis: Subcutaneous Heparin   Advance goals of care discussion: DNR   Consults: none  EDP discussed  with oncology.  Family Communication: no family was present at bedside, at the time of interview.   Disposition:  From: Home Likely will need Home on discharge.   Author: Berle Mull, MD [facility] 02/06/2020 11:59 PM   To reach On-call, see care teams to locate the attending and reach out to them via [email or web address]. If 7PM-7AM, please contact night-coverage If you still have difficulty reaching the attending provider, please page the [facility] (Director on Call) for [facility] on amion for assistance.

## 2020-02-06 NOTE — ED Notes (Signed)
ED TO INPATIENT HANDOFF REPORT  Name/Age/Gender Katie [name] 80 y.o. female  Code Status Code Status History    Date Active Date Inactive Code Status Order ID Comments User Context   12/14/2019 2347 12/24/2019 1941 DNR [id number]  Kayleen Memos, DO ED   06/28/2019 0008 06/29/2019 2031 DNR [id number]  Shirley, [place], DO Inpatient   01/05/2019 1025 01/06/2019 2042 Full Code [id number]  Gaynelle Arabian, MD Inpatient   Advance Care Planning Activity    Questions for Most Recent Historical Code Status (Order [id number])    Question Answer   In the event of cardiac or respiratory ARREST Do not call a "code blue"   In the event of cardiac or respiratory ARREST Do not perform Intubation, CPR, defibrillation or ACLS   In the event of cardiac or respiratory ARREST Use medication by any route, position, wound care, and other measures to relive pain and suffering. May use oxygen, suction and manual treatment of airway obstruction as needed for comfort.      Home/SNF/Other Home  Chief Complaint Fever, unknown origin [R50.9]  Level of Care/Admitting Diagnosis ED Disposition    ED Disposition Condition Comment   Admit  Hospital Area: [place] [[id number]]  Level of Care: Med-Surg [16]  Covid Evaluation: Confirmed COVID Negative  Diagnosis: Fever, unknown origin [998338]  Admitting Physician: Lavina Hamman [[id number]]  Attending Physician: Lavina Hamman [[id number]]       Medical History Past Medical History:  Diagnosis Date  . Arthritis   . Cancer [facility])    Endometrial  . Complication of anesthesia    very hard to wake up from Anesthesia- then when wakes up feels like she is going to faint or have vomiting  . Endometrial cancer [facility])    Per [facility] New Patient Packet   . Endometrial cancer ([place]) 12/18/2019  . Family history of adverse reaction to anesthesia    daughter has a hard time waking up from Anesthesia  . GERD (gastroesophageal reflux disease)   . Glaucoma    . Goals of care, counseling/discussion 12/18/2019  . History of fainting 2021   Per [facility] Patient Packet   . Hypothyroidism   . 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 PONV (postoperative nausea and vomiting)   . Thyroid disease     Allergies Allergies  Allergen Reactions  . Sulfa Antibiotics Rash  . Gabapentin Other (See Comments)    Depressed   . Lyrica [Pregabalin] Other (See Comments)    unknown  . Paxil [Paroxetine]     unknown    IV Location/Drains/Wounds Patient Lines/Drains/Airways Status    Active Line/Drains/Airways    Name Placement date Placement time Site Days   Implanted Port 12/21/19 Right Chest 12/21/19  1705  Chest  47   Airway 8.5 mm 12/22/19  1127  -- 46   Incision (Closed) 01/05/19 Knee Right 01/05/19  0750  -- 397          Labs/Imaging Results for orders placed or performed during the hospital encounter of 02/06/20 (from the past 48 hour(s))  Urinalysis, Routine w reflex microscopic Urine, Clean Catch     Status: Abnormal   Collection Time: 02/06/20  5:24 PM  Result Value Ref Range   Color, Urine YELLOW YELLOW   APPearance HAZY (A) CLEAR   Specific Gravity, Urine 1.024 1.005 - 1.030   pH 5.0 5.0 - 8.0  Glucose, UA NEGATIVE NEGATIVE mg/dL   Hgb urine dipstick NEGATIVE NEGATIVE   Bilirubin Urine NEGATIVE NEGATIVE   Ketones, ur NEGATIVE NEGATIVE mg/dL   Protein, ur 30 (A) NEGATIVE mg/dL   Nitrite NEGATIVE NEGATIVE   Leukocytes,Ua NEGATIVE NEGATIVE   RBC / HPF 0-5 0 - 5 RBC/hpf   WBC, UA 0-5 0 - 5 WBC/hpf   Bacteria, UA RARE (A) NONE SEEN   Squamous Epithelial / LPF 0-5 0 - 5   Mucus PRESENT     Comment: Performed at [facility], [place] [address] [place], [place] [address]  CBC with Differential     Status: Abnormal   Collection Time: 02/06/20  6:10 PM  Result Value Ref Range   WBC 19.4 (H) 4.0 - 10.5 K/uL   RBC  3.75 (L) 3.87 - 5.11 MIL/uL   Hemoglobin 10.9 (L) 12.0 - 15.0 g/dL   HCT 33.5 (L) 36.0 - 46.0 %   MCV 89.3 80.0 - 100.0 fL   MCH 29.1 26.0 - 34.0 pg   MCHC 32.5 30.0 - 36.0 g/dL   RDW 17.2 (H) 11.5 - 15.5 %   Platelets 176 150 - 400 K/uL   nRBC 0.1 0.0 - 0.2 %   Neutrophils Relative % 81 %   Neutro Abs 15.8 (H) 1.7 - 7.7 K/uL   Lymphocytes Relative 5 %   Lymphs Abs 1.0 0.7 - 4.0 K/uL   Monocytes Relative 6 %   Monocytes Absolute 1.2 (H) 0.1 - 1.0 K/uL   Eosinophils Relative 0 %   Eosinophils Absolute 0.0 0.0 - 0.5 K/uL   Basophils Relative 1 %   Basophils Absolute 0.1 0.0 - 0.1 K/uL   Immature Granulocytes 7 %   Abs Immature Granulocytes 1.31 (H) 0.00 - 0.07 K/uL   Polychromasia PRESENT     Comment: Performed at [facility], [place] [address] [place], [place] [address]  Comprehensive metabolic panel     Status: Abnormal   Collection Time: 02/06/20  6:10 PM  Result Value Ref Range   Sodium 129 (L) 135 - 145 mmol/L   Potassium 3.6 3.5 - 5.1 mmol/L   Chloride 97 (L) 98 - 111 mmol/L   CO2 21 (L) 22 - 32 mmol/L   Glucose, Bld 126 (H) 70 - 99 mg/dL    Comment: Glucose reference range applies only to samples taken after fasting for at least 8 hours.   BUN 10 8 - 23 mg/dL   Creatinine, Ser 0.56 0.44 - 1.00 mg/dL   Calcium 8.9 8.9 - 10.3 mg/dL   Total Protein 6.5 6.5 - 8.1 g/dL   Albumin 2.9 (L) 3.5 - 5.0 g/dL   AST 19 15 - 41 U/L   ALT 16 0 - 44 U/L   Alkaline Phosphatase 71 38 - 126 U/L   Total Bilirubin 0.5 0.3 - 1.2 mg/dL   GFR, Estimated >60 >60 mL/min    Comment: (NOTE) Calculated using the CKD-EPI Creatinine Equation (2021)    Anion gap 11 5 - 15    Comment: Performed at [facility], [place] [address] [place], [place] [address]  Lactic acid, plasma     Status: None   Collection Time: 02/06/20  6:10 PM  Result Value Ref Range   Lactic Acid, Venous 1.2 0.5 - 1.9 mmol/L    Comment: Performed at [facility], [place]  [address], [place], [place] [address]  Lipase, blood     Status: None   Collection  Time: 02/06/20  6:10 PM  Result Value Ref Range   Lipase 30 11 - 51 U/L    Comment: Performed at [facility], [place] [address], [place], [place] [address]  Resp Panel by RT-PCR (Flu A&B, Covid) Nasopharyngeal Swab     Status: None   Collection Time: 02/06/20  6:35 PM   Specimen: Nasopharyngeal Swab; Nasopharyngeal(NP) swabs in vial transport medium  Result Value Ref Range   SARS Coronavirus 2 by RT PCR NEGATIVE NEGATIVE    Comment: (NOTE) SARS-CoV-2 target nucleic acids are NOT DETECTED.  The SARS-CoV-2 RNA is generally detectable in upper respiratory specimens during the acute phase of infection. The lowest concentration of SARS-CoV-2 viral copies this assay can detect is 138 copies/mL. A negative result does not preclude SARS-Cov-2 infection and should not be used as the sole basis for treatment or other patient management decisions. A negative result may occur with  improper specimen collection/handling, submission of specimen other than nasopharyngeal swab, presence of viral mutation(s) within the areas targeted by this assay, and inadequate number of viral copies(<138 copies/mL). A negative result must be combined with clinical observations, patient history, and epidemiological information. The expected result is Negative.  Fact Sheet for Patients:  EntrepreneurPulse.com.au  Fact Sheet for Healthcare Providers:  IncredibleEmployment.be  This test is no t yet approved or cleared by the [place] FDA and  has been authorized for detection and/or diagnosis of SARS-CoV-2 by FDA under an Emergency Use Authorization (EUA). This EUA will remain  in effect (meaning this test can be used) for the duration of the COVID-19 declaration under Section 564(b)(1) of the Act, 21 U.S.C.section 360bbb-3(b)(1), unless the authorization is terminated  or  revoked sooner.       Influenza A by PCR NEGATIVE NEGATIVE   Influenza B by PCR NEGATIVE NEGATIVE    Comment: (NOTE) The Xpert Xpress SARS-CoV-2/FLU/RSV plus assay is intended as an aid in the diagnosis of influenza from Nasopharyngeal swab specimens and should not be used as a sole basis for treatment. Nasal washings and aspirates are unacceptable for Xpert Xpress SARS-CoV-2/FLU/RSV testing.  Fact Sheet for Patients: EntrepreneurPulse.com.au  Fact Sheet for Healthcare Providers: IncredibleEmployment.be  This test is not yet approved or cleared by the [place] FDA and has been authorized for detection and/or diagnosis of SARS-CoV-2 by FDA under an Emergency Use Authorization (EUA). This EUA will remain in effect (meaning this test can be used) for the duration of the COVID-19 declaration under Section 564(b)(1) of the Act, 21 U.S.C. section 360bbb-3(b)(1), unless the authorization is terminated or revoked.  Performed at [facility], [place] [address], [place],  [address]    DG Chest Portable 1 View  Result Date: 02/06/2020 CLINICAL DATA:  Fever EXAM: PORTABLE CHEST 1 VIEW COMPARISON:  01/20/2020 FINDINGS: Right Port-A-Cath remains in place, unchanged. Small right pleural effusion and right base atelectasis or scarring, stable. Left lung clear. Heart is normal size. IMPRESSION: Small right pleural effusion and right base atelectasis, stable. No acute findings. Electronically Signed   By: Rolm Baptise M.D.   On: 02/06/2020 17:53    Pending Labs Unresulted Labs (From admission, onward)          Start     Ordered   02/06/20 1755  Gastrointestinal Panel by PCR , Stool  (Gastrointestinal Panel by PCR, Stool                                                                                                                                                     *  Does Not include CLOSTRIDIUM DIFFICILE testing.**If CDIFF testing  is needed, select the C Difficile Quick Screen (NO PCR Reflex) order below)  Once,   STAT        02/06/20 1754   02/06/20 1755  C Difficile Quick Screen (NO PCR Reflex)  (Gastrointestinal Panel by PCR, Stool                                                                                                                                                     *Does Not include CLOSTRIDIUM DIFFICILE testing.**If CDIFF testing is needed, select the C Difficile Quick Screen (NO PCR Reflex) order below)  Once, for 24 hours,   STAT       References:    CDiff Information Tool   02/06/20 1754   02/06/20 1724  Urine culture  ONCE - STAT,   STAT        02/06/20 1723   02/06/20 1722  Blood culture (routine x 2)  BLOOD CULTURE X 2,   STAT      02/06/20 1722          Vitals/Pain Today's Vitals   02/06/20 1945 02/06/20 2000 02/06/20 2016 02/06/20 2100  BP:  (!) 154/102  (!) 157/93  Pulse: 88 85  84  Resp: (!) 29 (!) 28  20  Temp:      TempSrc:      SpO2: 96% 96%  96%  PainSc:   0-No pain     Isolation Precautions No active isolations  Medications Medications  ceFEPIme (MAXIPIME) 2 g in sodium chloride 0.9 % 100 mL IVPB (0 g Intravenous Stopped 02/06/20 1909)  acetaminophen (TYLENOL) tablet 650 mg (650 mg Oral Given 02/06/20 1746)  lactated ringers bolus 1,000 mL (0 mLs Intravenous Stopped 02/06/20 2016)  0.9 %  sodium chloride infusion ( Intravenous Infusion Verify 02/06/20 2109)    Mobility walks

## 2020-02-06 NOTE — ED Triage Notes (Signed)
Pt arriving from home with chills, fever, N/V. Pt had chemo on the 22nd and symptoms began shortly after. Denies any pain at this time.

## 2020-02-07 ENCOUNTER — Observation Stay (HOSPITAL_COMMUNITY): Payer: Medicare Other

## 2020-02-07 DIAGNOSIS — R509 Fever, unspecified: Secondary | ICD-10-CM | POA: Diagnosis not present

## 2020-02-07 DIAGNOSIS — R52 Pain, unspecified: Secondary | ICD-10-CM

## 2020-02-07 DIAGNOSIS — M7989 Other specified soft tissue disorders: Secondary | ICD-10-CM | POA: Diagnosis not present

## 2020-02-07 LAB — C DIFFICILE (CDIFF) QUICK SCRN (NO PCR REFLEX)

## 2020-02-07 LAB — COMPREHENSIVE METABOLIC PANEL
ALT: 14 U/L (ref 0–44)
AST: 18 U/L (ref 15–41)
Albumin: 2.7 g/dL — ABNORMAL LOW (ref 3.5–5.0)
Alkaline Phosphatase: 66 U/L (ref 38–126)
Anion gap: 10 (ref 5–15)
BUN: 8 mg/dL (ref 8–23)
CO2: 21 mmol/L — ABNORMAL LOW (ref 22–32)
Calcium: 8.4 mg/dL — ABNORMAL LOW (ref 8.9–10.3)
Chloride: 100 mmol/L (ref 98–111)
Creatinine, Ser: 0.61 mg/dL (ref 0.44–1.00)
GFR, Estimated: >60 mL/min (ref >60)
Glucose, Bld: 110 mg/dL — ABNORMAL HIGH (ref 70–99)
Potassium: 3.8 mmol/L (ref 3.5–5.1)
Sodium: 131 mmol/L — ABNORMAL LOW (ref 135–145)
Total Bilirubin: 0.7 mg/dL (ref 0.3–1.2)
Total Protein: 5.8 g/dL — ABNORMAL LOW (ref 6.5–8.1)

## 2020-02-07 LAB — CBC
HCT: 32.4 % — ABNORMAL LOW (ref 36.0–46.0)
Hemoglobin: 10.6 g/dL — ABNORMAL LOW (ref 12.0–15.0)
MCH: 29.7 pg (ref 26.0–34.0)
MCHC: 32.7 g/dL (ref 30.0–36.0)
MCV: 90.8 fL (ref 80.0–100.0)
Platelets: 167 10*3/uL (ref 150–400)
RBC: 3.57 MIL/uL — ABNORMAL LOW (ref 3.87–5.11)
RDW: 17.2 % — ABNORMAL HIGH (ref 11.5–15.5)
WBC: 17.7 10*3/uL — ABNORMAL HIGH (ref 4.0–10.5)
nRBC: 0 % (ref 0.0–0.2)

## 2020-02-07 MED ORDER — CHLORHEXIDINE GLUCONATE CLOTH 2 % EX PADS: 6.0000 | MEDICATED_PAD | Freq: Every day | CUTANEOUS | Status: DC

## 2020-02-07 MED ORDER — CHOLESTYRAMINE LIGHT 4 G PO PACK
4.0000 g | PACK | ORAL | Status: DC
  Filled 2020-02-07 (×2): qty 1

## 2020-02-07 NOTE — Progress Notes (Signed)
Pt received from ED, oriented to room 1617 and unit. Pt. Reports familiarity with unit. Pt. Alert and oriented, MEWS green, see flowsheet for assessment. Denies any pain. Advised of MD orders. IVF infusing through right chest port per MD order. Pt refused placement of PIV per MD order. Call bell placed within reach. Advised to call for assistance, bed alarm initiated. No distress noted. Will continue to monitor pt. Closely. Neomia Dear RN

## 2020-02-07 NOTE — Plan of Care (Signed)
Care plan initiated. Kjones RN 

## 2020-02-07 NOTE — Progress Notes (Signed)
Bilateral lower extremity venous study completed.      Please see CV Proc for preliminary results.   Ac Colan, RVT  

## 2020-02-07 NOTE — Progress Notes (Signed)
PROGRESS NOTE   Katie [name]  IPJ:[id number] DOB: Aug 31, 1940 DOA: 02/06/2020 PCP: [name], NP  Brief Narrative:  80 year old white female with endometrial CA status post surgery XRT 2011 Hypothyroid reflux osteoporosis, chronic diarrhea from prior cholecystectomy?  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  Extrinsic compression to esophagus  Had chemotherapy 12/22-developed diarrhea constipation mild low-grade fevers 100.7 sore throat in addition Found to have viral laryngitis with SIRS criterion given 1 dose of steroids kept on because of lower extremity setting this was worked up   [place]:   Active Problems:   Fever, unknown origin   1. SIRS criteria met-received Neulasta recently 2. Endometrial CA status post pleural effusion tapped 12/26/2019 and lung mets with chemo a. Differential is either viral infection or side effects from chemotherapy b. Received Neulasta 12/23 which may account for elevated white count c. Can continue prior to admission famciclovir 500, continue Megace 160 d. Feels better overall no high-grade fevers no further diarrhea at this juncture and she has chronic diarrhea at baseline e. Repeat labs in the a.m., continue saline 75 cc/H, continue cefepime for now with quick taper if no bacterial source 3. Hyponatremia secondary to volume losses poor solute intake a. Continue saline as above and recheck  4. Bilateral lower extremity edema a. Ultrasound pending-- low suspicion 5. Chronic diarrhea a. Continue Questran no diarrhea today 6. Hypothyroid a. Continue levothyroxine 75 daily 7. HTN a. Continue metoprolol 12.5 twice daily  DVT prophylaxis: Lovenox Code Status: DNR Family Communication: Discussed with [name] patient's daughter [phone] who is a pediatric nurse practitioner Disposition:   Status is: Observation  The patient remains OBS appropriate and will d/c before 2 midnights.  Dispo: The patient is from: Home              Anticipated d/c is to: Home              Anticipated d/c date is: 2 days              Patient currently is not medically stable to d/c.       Consultants:   None  Procedures: None  Antimicrobials: Cefepime   Subjective: Awake alert coherent no diarrhea no chills no rigors no current nausea vomiting Has chronic lower extremity edema/pain  Objective: Vitals:   02/06/20 2100 02/06/20 2131 02/07/20 0100 02/07/20 0608  BP: (!) 157/93 (!) 148/97 (!) 162/95 (!) 151/81  Pulse: 84 73 76 82  Resp: $Remo'20 16 16 16  'vEfME$ Temp:  98 F (36.7 C) 98.9 F (37.2 C) 98.1 F (36.7 C)  TempSrc:  Oral Oral Oral  SpO2: 96% 96% 97% 95%  Weight:  58.7 kg      Intake/Output Summary (Last 24 hours) at 02/07/2020 4097 Last data filed at 02/07/2020 0620 Gross per 24 hour  Intake 1673.6 ml  Output 750 ml  Net 923.6 ml   Filed Weights   02/06/20 2131  Weight: 58.7 kg    Examination:  Awake coherent alert no distress EOMI NCAT no focal deficit Chest clear no added sound rales rhonchi Abdomen soft no rebound no guarding Swollen lower extremities with no hyperesthesia Neck soft supple poor dentition on the right lower molars cannot appreciate significant swelling  Data Reviewed: I have personally reviewed following labs and imaging studies Sodium 129-->131 BUNs/creatinine 10/0.5-->8/0.6 White count 19-(717 Hemoglobin 10 Platelet 167  Blood culture on  1/1 no growth to date Urine culture 1/1 no growth to date  Radiology Studies: DG Chest Portable 1 View  Result Date: 02/06/2020 CLINICAL DATA:  Fever EXAM: PORTABLE CHEST 1 VIEW COMPARISON:  01/20/2020 FINDINGS: Right Port-A-Cath remains in place, unchanged. Small right pleural effusion and right base atelectasis or scarring, stable. Left lung clear. Heart is normal size. IMPRESSION: Small right pleural  effusion and right base atelectasis, stable. No acute findings. Electronically Signed   By: Rolm Baptise M.D.   On: 02/06/2020 17:53     Scheduled Meds: . brimonidine  1 drop Both Eyes BID  . Chlorhexidine Gluconate Cloth  6 each Topical Daily  . cholestyramine  4 g Oral QHS  . enoxaparin (LOVENOX) injection  40 mg Subcutaneous Q24H  . famciclovir  500 mg Oral Daily  . latanoprost  1 drop Both Eyes QHS  . levothyroxine  75 mcg Oral Q0600  . magic mouthwash w/lidocaine  5 mL Oral QID  . megestrol  160 mg Oral Daily  . metoprolol tartrate  12.5 mg Oral BID  . pantoprazole  40 mg Oral Daily  . vitamin B-12  1,000 mcg Oral Daily   Continuous Infusions: . [COMPLETED] sodium chloride 75 mL/hr at 02/06/20 2109  . sodium chloride    . ceFEPime (MAXIPIME) IV 2 g (02/07/20 0549)     LOS: 0 days    Time spent: [place], MD [facility] To contact the attending provider between 7A-7P or the covering provider during after hours 7P-7A, please log into the web site [email or web address] and access using universal [place] password for that web site. If you do not have the password, please call the hospital operator.  02/07/2020, 9:02 AM

## 2020-02-08 DIAGNOSIS — Z66 Do not resuscitate: Secondary | ICD-10-CM | POA: Diagnosis present

## 2020-02-08 DIAGNOSIS — E876 Hypokalemia: Secondary | ICD-10-CM | POA: Diagnosis present

## 2020-02-08 DIAGNOSIS — Z79890 Hormone replacement therapy (postmenopausal): Secondary | ICD-10-CM | POA: Diagnosis not present

## 2020-02-08 DIAGNOSIS — A419 Sepsis, unspecified organism: Secondary | ICD-10-CM | POA: Diagnosis present

## 2020-02-08 DIAGNOSIS — J040 Acute laryngitis: Secondary | ICD-10-CM | POA: Diagnosis present

## 2020-02-08 DIAGNOSIS — E222 Syndrome of inappropriate secretion of antidiuretic hormone: Secondary | ICD-10-CM | POA: Diagnosis present

## 2020-02-08 DIAGNOSIS — Z8542 Personal history of malignant neoplasm of other parts of uterus: Secondary | ICD-10-CM | POA: Diagnosis not present

## 2020-02-08 DIAGNOSIS — E860 Dehydration: Secondary | ICD-10-CM | POA: Diagnosis present

## 2020-02-08 DIAGNOSIS — Z79899 Other long term (current) drug therapy: Secondary | ICD-10-CM | POA: Diagnosis not present

## 2020-02-08 DIAGNOSIS — G629 Polyneuropathy, unspecified: Secondary | ICD-10-CM | POA: Diagnosis present

## 2020-02-08 DIAGNOSIS — H409 Unspecified glaucoma: Secondary | ICD-10-CM | POA: Diagnosis present

## 2020-02-08 DIAGNOSIS — K529 Noninfective gastroenteritis and colitis, unspecified: Secondary | ICD-10-CM | POA: Diagnosis present

## 2020-02-08 DIAGNOSIS — C7800 Secondary malignant neoplasm of unspecified lung: Secondary | ICD-10-CM | POA: Diagnosis present

## 2020-02-08 DIAGNOSIS — Z809 Family history of malignant neoplasm, unspecified: Secondary | ICD-10-CM | POA: Diagnosis not present

## 2020-02-08 DIAGNOSIS — R509 Fever, unspecified: Secondary | ICD-10-CM | POA: Diagnosis not present

## 2020-02-08 DIAGNOSIS — Z8261 Family history of arthritis: Secondary | ICD-10-CM | POA: Diagnosis not present

## 2020-02-08 DIAGNOSIS — K219 Gastro-esophageal reflux disease without esophagitis: Secondary | ICD-10-CM | POA: Diagnosis present

## 2020-02-08 DIAGNOSIS — R6510 Systemic inflammatory response syndrome (SIRS) of non-infectious origin without acute organ dysfunction: Secondary | ICD-10-CM | POA: Diagnosis present

## 2020-02-08 DIAGNOSIS — Z20822 Contact with and (suspected) exposure to covid-19: Secondary | ICD-10-CM | POA: Diagnosis present

## 2020-02-08 DIAGNOSIS — C541 Malignant neoplasm of endometrium: Secondary | ICD-10-CM | POA: Diagnosis present

## 2020-02-08 DIAGNOSIS — R600 Localized edema: Secondary | ICD-10-CM | POA: Diagnosis present

## 2020-02-08 DIAGNOSIS — E039 Hypothyroidism, unspecified: Secondary | ICD-10-CM | POA: Diagnosis present

## 2020-02-08 DIAGNOSIS — Z8249 Family history of ischemic heart disease and other diseases of the circulatory system: Secondary | ICD-10-CM | POA: Diagnosis not present

## 2020-02-08 DIAGNOSIS — I10 Essential (primary) hypertension: Secondary | ICD-10-CM | POA: Diagnosis present

## 2020-02-08 LAB — URINE CULTURE: Culture: 50000 — AB

## 2020-02-08 LAB — CBC WITH DIFFERENTIAL/PLATELET
Abs Immature Granulocytes: 1.52 10*3/uL — ABNORMAL HIGH (ref 0.00–0.07)
Basophils Absolute: 0.1 10*3/uL (ref 0.0–0.1)
Basophils Relative: 1 %
Eosinophils Absolute: 0 10*3/uL (ref 0.0–0.5)
Eosinophils Relative: 0 %
HCT: 28.2 % — ABNORMAL LOW (ref 36.0–46.0)
Hemoglobin: 9.2 g/dL — ABNORMAL LOW (ref 12.0–15.0)
Immature Granulocytes: 8 %
Lymphocytes Relative: 7 %
Lymphs Abs: 1.3 10*3/uL (ref 0.7–4.0)
MCH: 29.8 pg (ref 26.0–34.0)
MCHC: 32.6 g/dL (ref 30.0–36.0)
MCV: 91.3 fL (ref 80.0–100.0)
Monocytes Absolute: 0.8 10*3/uL (ref 0.1–1.0)
Monocytes Relative: 4 %
Neutro Abs: 15.5 10*3/uL — ABNORMAL HIGH (ref 1.7–7.7)
Neutrophils Relative %: 80 %
Platelets: 157 10*3/uL (ref 150–400)
RBC: 3.09 MIL/uL — ABNORMAL LOW (ref 3.87–5.11)
RDW: 17.3 % — ABNORMAL HIGH (ref 11.5–15.5)
WBC: 19.3 10*3/uL — ABNORMAL HIGH (ref 4.0–10.5)
nRBC: 0.3 % — ABNORMAL HIGH (ref 0.0–0.2)

## 2020-02-08 LAB — GASTROINTESTINAL PANEL BY PCR, STOOL (REPLACES STOOL CULTURE)

## 2020-02-08 LAB — COMPREHENSIVE METABOLIC PANEL
ALT: 13 U/L (ref 0–44)
AST: 16 U/L (ref 15–41)
Albumin: 2.3 g/dL — ABNORMAL LOW (ref 3.5–5.0)
Alkaline Phosphatase: 56 U/L (ref 38–126)
Anion gap: 8 (ref 5–15)
BUN: 15 mg/dL (ref 8–23)
CO2: 22 mmol/L (ref 22–32)
Calcium: 8 mg/dL — ABNORMAL LOW (ref 8.9–10.3)
Chloride: 103 mmol/L (ref 98–111)
Creatinine, Ser: 0.68 mg/dL (ref 0.44–1.00)
GFR, Estimated: >60 mL/min (ref >60)
Glucose, Bld: 95 mg/dL (ref 70–99)
Potassium: 3.3 mmol/L — ABNORMAL LOW (ref 3.5–5.1)
Sodium: 133 mmol/L — ABNORMAL LOW (ref 135–145)
Total Bilirubin: 0.2 mg/dL — ABNORMAL LOW (ref 0.3–1.2)
Total Protein: 5.2 g/dL — ABNORMAL LOW (ref 6.5–8.1)

## 2020-02-08 MED ORDER — POTASSIUM CHLORIDE CRYS ER 20 MEQ PO TBCR
40.0000 meq | EXTENDED_RELEASE_TABLET | Freq: Every day | ORAL | Status: DC
  Administered 2020-02-08 – 2020-02-09 (×2): 40 meq via ORAL
  Filled 2020-02-08 (×2): qty 2

## 2020-02-08 MED ORDER — MAGIC MOUTHWASH
5.0000 mL | Freq: Four times a day (QID) | ORAL | Status: DC
  Administered 2020-02-08 – 2020-02-09 (×4): 5 mL via ORAL
  Filled 2020-02-08 (×6): qty 5

## 2020-02-08 NOTE — Progress Notes (Signed)
PROGRESS NOTE   Katie [name]  VHQ:[id number] DOB: Nov 05, 1940 DOA: 02/06/2020 PCP: [name], NP  Brief Narrative:   80 year old white female with endometrial CA status post surgery XRT 2011 Hypothyroid reflux osteoporosis, chronic diarrhea from prior cholecystectomy?  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  Extrinsic compression to esophagus  Had chemotherapy 12/22-developed diarrhea constipation mild low-grade fevers 100.7 sore throat in addition Found to have viral laryngitis with SIRS criterion given 1 dose of steroids kept on because of lower extremity setting this was worked up   [place]:   Active Problems:   Fever, unknown origin   1. SIRS criteria met-received Neulasta recently 2. Endometrial CA status post pleural effusion tapped 12/26/2019 and lung mets with chemo a. Differential  viral infection or side effects from chemotherapy b. Received Neulasta 12/23 which may account for elevated white count c. continue famciclovir 500, continue Megace 160 d. Repeat labs in the a.m., continue saline 75 cc/H [patient drinking but may not be keeping up enough with fluids therefore continue],  e. continue cefepime, blood culture X 2 - to date, urine culture result pending f. Discontinue C. difficile and neutropenic precautions as has neither 3. Hyponatremia secondary to volume losses poor solute intake a. Continue saline--mild azotemia today so continue 4. Hypokalemia a. Replace with potassium K. Dur twice daily 5. Bilateral lower extremity edema a. Ultrasound bilateral lower extremities negative for DVT 6. Chronic diarrhea a. Continue Questran no diarrhea today b. C. difficile is negative 7. Hypothyroid a. Continue levothyroxine 75 daily 8. HTN a. Continue metoprolol 12.5 twice daily  DVT prophylaxis: Lovenox Code  Status: DNR Family Communication: Discussed with and updated [name] patient's daughter [phone] on 1/2 and updated We did not reach her on the phone today Disposition:  Status is: Observation  The patient remains OBS appropriate and will d/c before 2 midnights.  Dispo: The patient is from: Home              Anticipated d/c is to: Home              Anticipated d/c date is: 2 days              Patient currently is not medically stable to d/c.       Consultants:   None  Procedures: None  Antimicrobials: Cefepime   Subjective:  Feels weak and uncomfortable Otherwise no fever no chills no dysuria no nausea no vomiting Does not like Magic mouthwash with lidocaine therefore changed formulation to exclude that  Objective: Vitals:   02/07/20 2225 02/07/20 2227 02/07/20 2343 02/08/20 0537  BP: (!) 170/90 (!) 170/89 (!) 167/95 (!) 154/83  Pulse: 86 86 91 83  Resp:  17  17  Temp:  97.6 F (36.4 C)  98 F (36.7 C)  TempSrc:  Oral  Oral  SpO2:    94%  Weight:        Intake/Output Summary (Last 24 hours) at 02/08/2020 1033 Last data filed at 02/08/2020 1022 Gross per 24 hour  Intake 1307.91 ml  Output 1350 ml  Net -42.09 ml   Filed Weights   02/06/20 2131  Weight: 58.7 kg    Examination:  coherent EOMI --somewhat frail, right side of tongue has at tongue bite on it Chest clear no added sound rales rhonchi Abdomen soft no rebound no guarding Swollen lower extremities with no hyperesthesia Neck  soft supple poor dentition on the right lower molars cannot appreciate significant swelling  Data Reviewed: I have personally reviewed following labs and imaging studies  Sodium 129-->131-->133 Potassium down to 3.3 BUNs/creatinine 10/0.5-->8/0.6-->15/0.6 White count 19-->17-->19.3 Hemoglobin 9.2 Platelet 157 Blood culture on 1/1 no growth to date Urine culture 1/1 recultured  Radiology Studies: DG Chest Portable 1 View  Result Date: 02/06/2020 CLINICAL DATA:  Fever  EXAM: PORTABLE CHEST 1 VIEW COMPARISON:  01/20/2020 FINDINGS: Right Port-A-Cath remains in place, unchanged. Small right pleural effusion and right base atelectasis or scarring, stable. Left lung clear. Heart is normal size. IMPRESSION: Small right pleural effusion and right base atelectasis, stable. No acute findings. Electronically Signed   By: Rolm Baptise M.D.   On: 02/06/2020 17:53   VAS [place] LOWER EXTREMITY VENOUS (DVT)  Result Date: 02/07/2020  Lower Venous DVT Study Indications: Swelling, and Pain.  Comparison Study: No previous Performing Technologist: Vonzell Schlatter RVT  Examination Guidelines: A complete evaluation includes B-mode imaging, spectral Doppler, color Doppler, and power Doppler as needed of all accessible portions of each vessel. Bilateral testing is considered an integral part of a complete examination. Limited examinations for reoccurring indications may be performed as noted. The reflux portion of the exam is performed with the patient in reverse Trendelenburg.  +---------+---------------+---------+-----------+----------+--------------+ RIGHT    CompressibilityPhasicitySpontaneityPropertiesThrombus Aging +---------+---------------+---------+-----------+----------+--------------+ CFV      Full           Yes      Yes                                 +---------+---------------+---------+-----------+----------+--------------+ SFJ      Full                                                        +---------+---------------+---------+-----------+----------+--------------+ FV Prox  Full                                                        +---------+---------------+---------+-----------+----------+--------------+ FV Mid   Full                                                        +---------+---------------+---------+-----------+----------+--------------+ FV DistalFull                                                         +---------+---------------+---------+-----------+----------+--------------+ PFV      Full                                                        +---------+---------------+---------+-----------+----------+--------------+ POP      Full  Yes      Yes                                 +---------+---------------+---------+-----------+----------+--------------+ PTV      Full                                                        +---------+---------------+---------+-----------+----------+--------------+ PERO     Full                                                        +---------+---------------+---------+-----------+----------+--------------+   +---------+---------------+---------+-----------+----------+--------------+ LEFT     CompressibilityPhasicitySpontaneityPropertiesThrombus Aging +---------+---------------+---------+-----------+----------+--------------+ CFV      Full           Yes      Yes                                 +---------+---------------+---------+-----------+----------+--------------+ SFJ      Full                                                        +---------+---------------+---------+-----------+----------+--------------+ FV Prox  Full                                                        +---------+---------------+---------+-----------+----------+--------------+ FV Mid   Full                                                        +---------+---------------+---------+-----------+----------+--------------+ FV DistalFull                                                        +---------+---------------+---------+-----------+----------+--------------+ PFV      Full                                                        +---------+---------------+---------+-----------+----------+--------------+ POP      Full           Yes      Yes                                  +---------+---------------+---------+-----------+----------+--------------+ PTV  Full                                                        +---------+---------------+---------+-----------+----------+--------------+ PERO     Full                                                        +---------+---------------+---------+-----------+----------+--------------+     Summary: RIGHT: - There is no evidence of deep vein thrombosis in the lower extremity.  - No cystic structure found in the popliteal fossa.  LEFT: - There is no evidence of deep vein thrombosis in the lower extremity.  - A cystic structure is found in the popliteal fossa.  *See table(s) above for measurements and observations.    Preliminary      Scheduled Meds: . brimonidine  1 drop Both Eyes BID  . Chlorhexidine Gluconate Cloth  6 each Topical Daily  . cholestyramine light  4 g Oral Q24H  . enoxaparin (LOVENOX) injection  40 mg Subcutaneous Q24H  . famciclovir  500 mg Oral Daily  . latanoprost  1 drop Both Eyes QHS  . levothyroxine  75 mcg Oral Q0600  . magic mouthwash w/lidocaine  5 mL Oral QID  . megestrol  160 mg Oral Daily  . metoprolol tartrate  12.5 mg Oral BID  . pantoprazole  40 mg Oral Daily  . potassium chloride  40 mEq Oral Daily  . vitamin B-12  1,000 mcg Oral Daily   Continuous Infusions: . sodium chloride 1,000 mL (02/07/20 1132)  . ceFEPime (MAXIPIME) IV 2 g (02/08/20 0602)     LOS: 0 days    Time spent: 30  Nita Sells, MD [facility] To contact the attending provider between 7A-7P or the covering provider during after hours 7P-7A, please log into the web site [email or web address] and access using universal [place] password for that web site. If you do not have the password, please call the hospital operator.  02/08/2020, 10:33 AM

## 2020-02-09 DIAGNOSIS — R509 Fever, unspecified: Secondary | ICD-10-CM | POA: Diagnosis not present

## 2020-02-09 LAB — COMPREHENSIVE METABOLIC PANEL
ALT: 13 U/L (ref 0–44)
AST: 16 U/L (ref 15–41)
Albumin: 2.3 g/dL — ABNORMAL LOW (ref 3.5–5.0)
Alkaline Phosphatase: 55 U/L (ref 38–126)
Anion gap: 7 (ref 5–15)
BUN: 8 mg/dL (ref 8–23)
CO2: 22 mmol/L (ref 22–32)
Calcium: 8 mg/dL — ABNORMAL LOW (ref 8.9–10.3)
Chloride: 102 mmol/L (ref 98–111)
Creatinine, Ser: 0.65 mg/dL (ref 0.44–1.00)
GFR, Estimated: >60 mL/min (ref >60)
Glucose, Bld: 91 mg/dL (ref 70–99)
Potassium: 3.6 mmol/L (ref 3.5–5.1)
Sodium: 131 mmol/L — ABNORMAL LOW (ref 135–145)
Total Bilirubin: 0.3 mg/dL (ref 0.3–1.2)
Total Protein: 5.3 g/dL — ABNORMAL LOW (ref 6.5–8.1)

## 2020-02-09 LAB — CBC WITH DIFFERENTIAL/PLATELET
Abs Immature Granulocytes: 1.31 10*3/uL — ABNORMAL HIGH (ref 0.00–0.07)
Basophils Absolute: 0.1 10*3/uL (ref 0.0–0.1)
Basophils Relative: 1 %
Eosinophils Absolute: 0 10*3/uL (ref 0.0–0.5)
Eosinophils Relative: 0 %
HCT: 30 % — ABNORMAL LOW (ref 36.0–46.0)
Hemoglobin: 9.7 g/dL — ABNORMAL LOW (ref 12.0–15.0)
Immature Granulocytes: 7 %
Lymphocytes Relative: 7 %
Lymphs Abs: 1.3 10*3/uL (ref 0.7–4.0)
MCH: 29.2 pg (ref 26.0–34.0)
MCHC: 32.3 g/dL (ref 30.0–36.0)
MCV: 90.4 fL (ref 80.0–100.0)
Monocytes Absolute: 0.9 10*3/uL (ref 0.1–1.0)
Monocytes Relative: 5 %
Neutro Abs: 14 10*3/uL — ABNORMAL HIGH (ref 1.7–7.7)
Neutrophils Relative %: 80 %
Platelets: 158 10*3/uL (ref 150–400)
RBC: 3.32 MIL/uL — ABNORMAL LOW (ref 3.87–5.11)
RDW: 17.4 % — ABNORMAL HIGH (ref 11.5–15.5)
WBC: 17.6 10*3/uL — ABNORMAL HIGH (ref 4.0–10.5)
nRBC: 0.3 % — ABNORMAL HIGH (ref 0.0–0.2)

## 2020-02-09 MED ORDER — MAGIC MOUTHWASH: 5.0000 mL | Freq: Four times a day (QID) | ORAL | 0 refills | Status: AC

## 2020-02-09 MED ORDER — HEPARIN SOD (PORK) LOCK FLUSH 100 UNIT/ML IV SOLN
500.0000 [IU] | INTRAVENOUS | Status: DC | PRN
  Filled 2020-02-09: qty 5

## 2020-02-09 NOTE — Discharge Summary (Signed)
Physician Discharge Summary  Katie [name] DUK:[id number] DOB: [birth year] DOA: 02/06/2020  PCP: [name], NP  Admit date: 02/06/2020 Discharge date: 02/09/2020  Time spent: 26 minutes  Recommendations for Outpatient Follow-up:  1. Recommend CBC Chem-12 and further management in the outpatient setting for hydration etc. 2. Outpatient further follow-up with Dr. Marin Olp who will be CCed on this note as she has chemotherapy scheduled for 1/12 and may need dosage adjustment  Discharge Diagnoses:  Active Problems:   Fever, unknown origin   Sepsis [facility])   Discharge Condition: Improved  Diet recommendation: Heart healthy  Filed Weights   02/06/20 2131  Weight: 58.7 kg    History of present illness:  80 year old white female with endometrial CA status post surgery XRT 2011 Hypothyroid reflux osteoporosis, chronic diarrhea from prior cholecystectomy?  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  Extrinsic compression to esophagus  Had chemotherapy 12/22-developed diarrhea constipation mild low-grade fevers 100.7 sore throat in addition Found to have viral laryngitis with SIRS criterion given 1 dose of steroids kept on because of lower extremity setting this was worked up  Hospital Course:    1. SIRS criteria met-received Neulasta recently 2. Endometrial CA status post pleural effusion tapped 12/26/2019 and lung mets with chemo a. Differential  viral infection or side effects from chemotherapy b. Received Neulasta 12/23 which may account for elevated white count c. continue famciclovir 500, continue Megace 160 d. Lab stabilized during hospital stay e. Antibiotics were discontinued as negative blood cultures negative urine culture f. Discontinue C. difficile and neutropenic precautions as has neither 3. Hyponatremia secondary to volume losses  poor solute intake a.  was given IV saline in addition to potassium replacement b. Stabilized 4. Hypokalemia a. Replace with potassium K. Dur twice daily discontinue replacement on discharge 5. Bilateral lower extremity edema a. Ultrasound bilateral lower extremities negative for DVT 6. Chronic diarrhea a. Continue Questran no diarrhea today b. C. difficile is negative 7. Hypothyroid a. Continue levothyroxine 75 daily 8. HTN a. Continue metoprolol 12.5 twice daily   Consultations:  None  Discharge Exam: Vitals:   02/08/20 2110 02/09/20 0505  BP: (!) 159/103 (!) 159/95  Pulse: 79 79  Resp: 16 18  Temp: 98.8 F (37.1 C) 98.4 F (36.9 C)  SpO2: 94% 94%    General: Awake coherent no distress neck soft supple no submandibular lymphadenopathy-mouth examined yesterday with slight mucosal damage to the right side of tongue No thrush Cardiovascular: S1-S2 no murmur, right-sided port Respiratory: Clear no added sound Abdomen soft nontender no rebound no guarding Neurologically intact but does have pain lower extremities both extremities are swollen which is baseline for her  Discharge Instructions   Discharge Instructions    Diet - low sodium heart healthy   Complete by: As directed    Discharge instructions   Complete by: As directed    You were found to have possibly side effect from her chemotherapy and although we initially thought you might of had infection all of the data obtained during this hospital he suggested that it is not an infection We recommend that you continue the Magic mouthwash for your symptoms-I would also recommend over-the-counter Orajel for the site of your tongue which pains when you eat  please try to eat a more if you can as you did have a little bit of low potassium and low sodium this hospital stay You  should have labs done in the outpatient setting prior to your further management with Dr. Marin Olp who I will CC on this note Best of luck    Increase activity slowly   Complete by: As directed      Allergies as of 02/09/2020      Reactions   Sulfa Antibiotics Rash   Gabapentin Other (See Comments)   Depressed    Lyrica [pregabalin] Other (See Comments)   unknown   Paxil [paroxetine]    unknown      Medication List    STOP taking these medications   dexamethasone 4 MG tablet Commonly known as: DECADRON   magic mouthwash w/lidocaine Soln     TAKE these medications   acetaminophen 500 MG tablet Commonly known as: TYLENOL Take 1,000 mg by mouth daily. At bedtime.   bimatoprost 0.01 % Soln Commonly known as: LUMIGAN Place 1 drop into both eyes at bedtime. Can not take Generic   brimonidine 0.1 % Soln Commonly known as: ALPHAGAN P Place 1 drop into both eyes 2 (two) times daily. Can not take generic   cholecalciferol 25 MCG (1000 UNIT) tablet Commonly known as: VITAMIN D Take 3,000 Units by mouth daily.   cholestyramine 4 g packet Commonly known as: QUESTRAN Take 4 g by mouth as directed. Take 1 packet at bedtime but can Take daily as needed for constipation   docusate sodium 100 MG capsule Commonly known as: COLACE Take 1 capsule (100 mg total) by mouth daily. What changed:   when to take this  reasons to take this   esomeprazole 20 MG capsule Commonly known as: NEXIUM Take 20 mg by mouth daily at 12 noon.   famciclovir 500 MG tablet Commonly known as: FAMVIR Take 1 tablet (500 mg total) by mouth daily.   levothyroxine 75 MCG tablet Commonly known as: SYNTHROID TAKE 1 TABLET(75 MCG) BY MOUTH DAILY What changed: See the new instructions.   loratadine 10 MG tablet Commonly known as: CLARITIN Take 10 mg by mouth daily.   magic mouthwash Soln Take 5 mLs by mouth 4 (four) times daily.   megestrol 40 MG tablet Commonly known as: MEGACE Take 4 tablets (160 mg total) by mouth daily.   metoprolol tartrate 25 MG tablet Commonly known as: LOPRESSOR Take 0.5 tablets (12.5 mg total) by mouth 2  (two) times daily.   ondansetron 8 MG tablet Commonly known as: Zofran Take 1 tablet (8 mg total) by mouth 2 (two) times daily as needed for refractory nausea / vomiting. Start on day 3 after chemo.   prochlorperazine 10 MG tablet Commonly known as: COMPAZINE Take 1 tablet (10 mg total) by mouth every 6 (six) hours as needed (Nausea or vomiting).   vitamin B-12 1000 MCG tablet Commonly known as: CYANOCOBALAMIN Take 1,000 mcg by mouth daily.      Allergies  Allergen Reactions  . Sulfa Antibiotics Rash  . Gabapentin Other (See Comments)    Depressed   . Lyrica [Pregabalin] Other (See Comments)    unknown  . Paxil [Paroxetine]     unknown      The results of significant diagnostics from this hospitalization (including imaging, microbiology, ancillary and laboratory) are listed below for reference.    Significant Diagnostic Studies: DG Chest 2 View  Result Date: 01/21/2020 CLINICAL DATA:  Shortness of breath. Recurrent right pleural effusion. History of metastatic endometrial carcinoma. EXAM: CHEST - 2 VIEW COMPARISON:  December 23, 2019. FINDINGS: The heart size and mediastinal contours are within  normal limits. Left lung is clear. No pneumothorax is noted. Right internal jugular Port-A-Cath is unchanged. Stable right pleural effusion is noted with associated right basilar atelectasis or scarring. The visualized skeletal structures are unremarkable. IMPRESSION: Stable right pleural effusion with associated right basilar atelectasis or scarring. Electronically Signed   By: Marijo Conception M.D.   On: 01/21/2020 09:36   DG Chest Portable 1 View  Result Date: 02/06/2020 CLINICAL DATA:  Fever EXAM: PORTABLE CHEST 1 VIEW COMPARISON:  01/20/2020 FINDINGS: Right Port-A-Cath remains in place, unchanged. Small right pleural effusion and right base atelectasis or scarring, stable. Left lung clear. Heart is normal size. IMPRESSION: Small right pleural effusion and right base atelectasis,  stable. No acute findings. Electronically Signed   By: Rolm Baptise M.D.   On: 02/06/2020 17:53   VAS [place] LOWER EXTREMITY VENOUS (DVT)  Result Date: 02/08/2020  Lower Venous DVT Study Indications: Swelling, and Pain.  Comparison Study: No previous Performing Technologist: Vonzell Schlatter RVT  Examination Guidelines: A complete evaluation includes B-mode imaging, spectral Doppler, color Doppler, and power Doppler as needed of all accessible portions of each vessel. Bilateral testing is considered an integral part of a complete examination. Limited examinations for reoccurring indications may be performed as noted. The reflux portion of the exam is performed with the patient in reverse Trendelenburg.  +---------+---------------+---------+-----------+----------+--------------+ RIGHT    CompressibilityPhasicitySpontaneityPropertiesThrombus Aging +---------+---------------+---------+-----------+----------+--------------+ CFV      Full           Yes      Yes                                 +---------+---------------+---------+-----------+----------+--------------+ SFJ      Full                                                        +---------+---------------+---------+-----------+----------+--------------+ FV Prox  Full                                                        +---------+---------------+---------+-----------+----------+--------------+ FV Mid   Full                                                        +---------+---------------+---------+-----------+----------+--------------+ FV DistalFull                                                        +---------+---------------+---------+-----------+----------+--------------+ PFV      Full                                                        +---------+---------------+---------+-----------+----------+--------------+  POP      Full           Yes      Yes                                  +---------+---------------+---------+-----------+----------+--------------+ PTV      Full                                                        +---------+---------------+---------+-----------+----------+--------------+ PERO     Full                                                        +---------+---------------+---------+-----------+----------+--------------+   +---------+---------------+---------+-----------+----------+--------------+ LEFT     CompressibilityPhasicitySpontaneityPropertiesThrombus Aging +---------+---------------+---------+-----------+----------+--------------+ CFV      Full           Yes      Yes                                 +---------+---------------+---------+-----------+----------+--------------+ SFJ      Full                                                        +---------+---------------+---------+-----------+----------+--------------+ FV Prox  Full                                                        +---------+---------------+---------+-----------+----------+--------------+ FV Mid   Full                                                        +---------+---------------+---------+-----------+----------+--------------+ FV DistalFull                                                        +---------+---------------+---------+-----------+----------+--------------+ PFV      Full                                                        +---------+---------------+---------+-----------+----------+--------------+ POP      Full           Yes      Yes                                 +---------+---------------+---------+-----------+----------+--------------+  PTV      Full                                                        +---------+---------------+---------+-----------+----------+--------------+ PERO     Full                                                         +---------+---------------+---------+-----------+----------+--------------+     Summary: RIGHT: - There is no evidence of deep vein thrombosis in the lower extremity.  - No cystic structure found in the popliteal fossa.  LEFT: - There is no evidence of deep vein thrombosis in the lower extremity.  - A cystic structure is found in the popliteal fossa.  *See table(s) above for measurements and observations. Electronically signed by Ruta Hinds MD on 02/08/2020 at 12:13:14 PM.    Final     Microbiology: Recent Results (from the past 240 hour(s))  Urine culture     Status: Abnormal   Collection Time: 02/06/20  5:24 PM   Specimen: Urine, Clean Catch  Result Value Ref Range Status   Specimen Description   Final    URINE, CLEAN CATCH Performed at [facility] [address], [place], [place] [address]    Special Requests   Final    NONE Performed at [facility], [place] [address] [place], [place] [address]    Culture (A)  Final    50,000 COLONIES/mL VIRIDANS STREPTOCOCCUS Standardized susceptibility testing for this organism is not available. Performed at [facility], [place] [address] [place], [place] [address]    Report Status 02/08/2020 FINAL  Final  Gastrointestinal Panel by PCR , Stool     Status: None   Collection Time: 02/06/20  5:55 PM   Specimen: Stool  Result Value Ref Range Status   Campylobacter species NOT DETECTED NOT DETECTED Final   Plesimonas shigelloides NOT DETECTED NOT DETECTED Final   Salmonella species NOT DETECTED NOT DETECTED Final   Yersinia enterocolitica NOT DETECTED NOT DETECTED Final   Vibrio species NOT DETECTED NOT DETECTED Final   Vibrio cholerae NOT DETECTED NOT DETECTED Final   Enteroaggregative E coli (EAEC) NOT DETECTED NOT DETECTED Final   Enteropathogenic E coli (EPEC) NOT DETECTED NOT DETECTED Final   Enterotoxigenic E coli (ETEC) NOT DETECTED NOT DETECTED Final   Shiga like toxin producing E coli (STEC)  NOT DETECTED NOT DETECTED Final   Shigella/Enteroinvasive E coli (EIEC) NOT DETECTED NOT DETECTED Final   Cryptosporidium NOT DETECTED NOT DETECTED Final   Cyclospora cayetanensis NOT DETECTED NOT DETECTED Final   Entamoeba histolytica NOT DETECTED NOT DETECTED Final   Giardia lamblia NOT DETECTED NOT DETECTED Final   Adenovirus F40/41 NOT DETECTED NOT DETECTED Final   Astrovirus NOT DETECTED NOT DETECTED Final   Norovirus GI/GII NOT DETECTED NOT DETECTED Final   Rotavirus A NOT DETECTED NOT DETECTED Final   Sapovirus (I, II, IV, and V) NOT DETECTED NOT DETECTED Final    Comment: Performed at [facility], [place] [place], [place] [address]  C Difficile Quick Screen (NO PCR Reflex)     Status: Abnormal  Collection Time: 02/06/20  5:55 PM   Specimen: STOOL  Result Value Ref Range Status   C Diff antigen (A) NEGATIVE Final    FORMED STOOL SPECIMEN SUBMITTED.  DOES NOT MEET TESTING CRITERIA, ORDER CREDITED   C Diff toxin (A) NEGATIVE Final    FORMED STOOL SPECIMEN SUBMITTED.  DOES NOT MEET TESTING CRITERIA, ORDER CREDITED   C Diff interpretation   Final    FORMED STOOL SPECIMEN SUBMITTED.  DOES NOT MEET TESTING CRITERIA, ORDER CREDITED    Comment: Performed at [facility] [address], [place], [place] [address]  Blood culture (routine x 2)     Status: None (Preliminary result)   Collection Time: 02/06/20  6:10 PM   Specimen: BLOOD  Result Value Ref Range Status   Specimen Description   Final    BLOOD PORTA CATH Performed at [facility] [address], [place], [place] [address]    Special Requests   Final    BOTTLES DRAWN AEROBIC AND ANAEROBIC Blood Culture results may not be optimal due to an inadequate volume of blood received in culture bottles Performed at [facility] [address] [place], [place] [address]    Culture   Final    NO GROWTH 3 DAYS Performed at [facility], [place]  [address] [place], [place] [address]    Report Status PENDING  Incomplete  Resp Panel by RT-PCR (Flu A&B, Covid) Nasopharyngeal Swab     Status: None   Collection Time: 02/06/20  6:35 PM   Specimen: Nasopharyngeal Swab; Nasopharyngeal(NP) swabs in vial transport medium  Result Value Ref Range Status   SARS Coronavirus 2 by RT PCR NEGATIVE NEGATIVE Final    Comment: (NOTE) SARS-CoV-2 target nucleic acids are NOT DETECTED.  The SARS-CoV-2 RNA is generally detectable in upper respiratory specimens during the acute phase of infection. The lowest concentration of SARS-CoV-2 viral copies this assay can detect is 138 copies/mL. A negative result does not preclude SARS-Cov-2 infection and should not be used as the sole basis for treatment or other patient management decisions. A negative result may occur with  improper specimen collection/handling, submission of specimen other than nasopharyngeal swab, presence of viral mutation(s) within the areas targeted by this assay, and inadequate number of viral copies(<138 copies/mL). A negative result must be combined with clinical observations, patient history, and epidemiological information. The expected result is Negative.  Fact Sheet for Patients:  EntrepreneurPulse.com.au  Fact Sheet for Healthcare Providers:  IncredibleEmployment.be  This test is no t yet approved or cleared by the [place] FDA and  has been authorized for detection and/or diagnosis of SARS-CoV-2 by FDA under an Emergency Use Authorization (EUA). This EUA will remain  in effect (meaning this test can be used) for the duration of the COVID-19 declaration under Section 564(b)(1) of the Act, 21 U.S.C.section 360bbb-3(b)(1), unless the authorization is terminated  or revoked sooner.       Influenza A by PCR NEGATIVE NEGATIVE Final   Influenza B by PCR NEGATIVE NEGATIVE Final    Comment: (NOTE) The Xpert Xpress SARS-CoV-2/FLU/RSV plus  assay is intended as an aid in the diagnosis of influenza from Nasopharyngeal swab specimens and should not be used as a sole basis for treatment. Nasal washings and aspirates are unacceptable for Xpert Xpress SARS-CoV-2/FLU/RSV testing.  Fact Sheet for Patients: EntrepreneurPulse.com.au  Fact Sheet for Healthcare Providers: IncredibleEmployment.be  This test is not yet approved or cleared by the [place] FDA  and has been authorized for detection and/or diagnosis of SARS-CoV-2 by FDA under an Emergency Use Authorization (EUA). This EUA will remain in effect (meaning this test can be used) for the duration of the COVID-19 declaration under Section 564(b)(1) of the Act, 21 U.S.C. section 360bbb-3(b)(1), unless the authorization is terminated or revoked.  Performed at [facility], [place] [address], [place], [place] [address]   Blood culture (routine x 2)     Status: None (Preliminary result)   Collection Time: 02/06/20  7:10 PM   Specimen: BLOOD  Result Value Ref Range Status   Specimen Description   Final    BLOOD LEFT ARM Performed at [facility] [address] [place], [place] [address]    Special Requests   Final    BOTTLES DRAWN AEROBIC AND ANAEROBIC Blood Culture results may not be optimal due to an inadequate volume of blood received in culture bottles Performed at [facility] [address], [place], [place] [address]    Culture   Final    NO GROWTH 3 DAYS Performed at [facility], [place] [address] [place], [place] [address]    Report Status PENDING  Incomplete     Labs: Basic Metabolic Panel: Recent Labs  Lab 02/06/20 1810 02/07/20 0500 02/08/20 0516 02/09/20 0411  NA 129* 131* 133* 131*  K 3.6 3.8 3.3* 3.6  CL 97* 100 103 102  CO2 21* 21* 22 22  GLUCOSE 126* 110* 95 91  BUN _0 CREATININE 0.56 0.61 0.68 0.65  CALCIUM 8.9 8.4* 8.0* 8.0*    Liver Function Tests: Recent Labs  Lab 02/06/20 1810 02/07/20 0500 02/08/20 0516 02/09/20 0411  AST _1 ALT _2 ALKPHOS 71 66 56 55  BILITOT 0.5 0.7 0.2* 0.3  PROT 6.5 5.8* 5.2* 5.3*  ALBUMIN 2.9* 2.7* 2.3* 2.3*   Recent Labs  Lab 02/06/20 1810  LIPASE 30   No results for input(s): AMMONIA in the last 168 hours. CBC: Recent Labs  Lab 02/06/20 1810 02/07/20 0500 02/08/20 0516 02/09/20 0411  WBC 19.4* 17.7* 19.3* 17.6*  NEUTROABS 15.8*  --  15.5* 14.0*  HGB 10.9* 10.6* 9.2* 9.7*  HCT 33.5* 32.4* 28.2* 30.0*  MCV 89.3 90.8 91.3 90.4  PLT 176 167 157 158   Cardiac Enzymes: No results for input(s): CKTOTAL, CKMB, CKMBINDEX, TROPONINI in the last 168 hours. BNP: BNP (last 3 results) No results for input(s): BNP in the last 8760 hours.  ProBNP (last 3 results) No results for input(s): PROBNP in the last 8760 hours.  CBG: No results for input(s): GLUCAP in the last 168 hours.     Signed:  Nita Sells MD   [facility] 02/09/2020, 9:34 AM

## 2020-02-10 ENCOUNTER — Ambulatory Visit: Payer: Medicare Other | Admitting: Podiatry

## 2020-02-10 ENCOUNTER — Telehealth: Payer: Self-pay | Admitting: *Deleted

## 2020-02-10 NOTE — Telephone Encounter (Signed)
Transition Care Management Follow-Up Telephone Call   Date discharged and where:02/09/2020 [place]  How have you been since you were released from the hospital? Fine. A lot Better  Any patient concerns? No  Items Reviewed:   Meds: Yes  Allergies:Yes  Dietary Changes Reviewed:Yes  Functional Questionnaire:  Independent-I Dependent-D  ADLs:I   Dressing- I    Eating-I   Maintaining continence-I   Transferring-I with assistance [place]   Transportation-D has daughter and an aid   Meal Prep-D   Managing Meds- I  Confirmed importance and Date/Time of follow-up visits scheduled:Patient stated that she does not need a follow up appointment at this time. Stated that she is going through Chemo treatments. Stated that she will discuss this with her daughter.    Confirmed with patient if condition worsens to call PCP or go to the Emergency Dept. Patient was given office number and encouraged to call back with questions or concerns: Yes

## 2020-02-11 LAB — CULTURE, BLOOD (ROUTINE X 2)
Culture: NO GROWTH
Culture: NO GROWTH

## 2020-02-15 ENCOUNTER — Encounter: Payer: Self-pay | Admitting: Nurse Practitioner

## 2020-02-15 ENCOUNTER — Encounter: Payer: Self-pay | Admitting: Hematology & Oncology

## 2020-02-15 ENCOUNTER — Other Ambulatory Visit: Payer: Self-pay | Admitting: *Deleted

## 2020-02-15 DIAGNOSIS — J90 Pleural effusion, not elsewhere classified: Secondary | ICD-10-CM

## 2020-02-15 MED ORDER — METOPROLOL TARTRATE 25 MG PO TABS: 12.5000 mg | ORAL_TABLET | Freq: Two times a day (BID) | ORAL | 1 refills | Status: DC

## 2020-02-15 MED ORDER — MEGESTROL ACETATE 40 MG PO TABS: 160.0000 mg | ORAL_TABLET | Freq: Every day | ORAL | 1 refills | Status: AC

## 2020-02-15 MED ORDER — METOPROLOL TARTRATE 25 MG PO TABS: 12.5000 mg | ORAL_TABLET | Freq: Two times a day (BID) | ORAL | 1 refills | Status: AC

## 2020-02-17 ENCOUNTER — Encounter: Payer: Self-pay | Admitting: *Deleted

## 2020-02-17 ENCOUNTER — Inpatient Hospital Stay: Payer: Medicare Other | Attending: Hematology & Oncology

## 2020-02-17 ENCOUNTER — Telehealth: Payer: Self-pay

## 2020-02-17 ENCOUNTER — Telehealth: Payer: Self-pay | Admitting: Hematology & Oncology

## 2020-02-17 ENCOUNTER — Inpatient Hospital Stay: Payer: Medicare Other

## 2020-02-17 ENCOUNTER — Inpatient Hospital Stay (HOSPITAL_BASED_OUTPATIENT_CLINIC_OR_DEPARTMENT_OTHER): Payer: Medicare Other | Admitting: Hematology & Oncology

## 2020-02-17 ENCOUNTER — Encounter: Payer: Self-pay | Admitting: Hematology & Oncology

## 2020-02-17 ENCOUNTER — Other Ambulatory Visit: Payer: Self-pay

## 2020-02-17 VITALS — BP 146/76 | HR 79 | Temp 98.0°F | Resp 18 | Wt 125.6 lb

## 2020-02-17 VITALS — BP 130/61 | HR 71 | Temp 97.7°F | Resp 17

## 2020-02-17 DIAGNOSIS — Z5111 Encounter for antineoplastic chemotherapy: Secondary | ICD-10-CM | POA: Diagnosis not present

## 2020-02-17 DIAGNOSIS — C541 Malignant neoplasm of endometrium: Secondary | ICD-10-CM

## 2020-02-17 DIAGNOSIS — C7800 Secondary malignant neoplasm of unspecified lung: Secondary | ICD-10-CM | POA: Insufficient documentation

## 2020-02-17 DIAGNOSIS — J910 Malignant pleural effusion: Secondary | ICD-10-CM

## 2020-02-17 DIAGNOSIS — Z79899 Other long term (current) drug therapy: Secondary | ICD-10-CM | POA: Insufficient documentation

## 2020-02-17 DIAGNOSIS — Z5112 Encounter for antineoplastic immunotherapy: Secondary | ICD-10-CM | POA: Diagnosis not present

## 2020-02-17 DIAGNOSIS — Z5189 Encounter for other specified aftercare: Secondary | ICD-10-CM | POA: Insufficient documentation

## 2020-02-17 LAB — CMP (CANCER CENTER ONLY)
ALT: 10 U/L (ref 0–44)
AST: 13 U/L — ABNORMAL LOW (ref 15–41)
Albumin: 3.3 g/dL — ABNORMAL LOW (ref 3.5–5.0)
Alkaline Phosphatase: 46 U/L (ref 38–126)
Anion gap: 9 (ref 5–15)
BUN: 11 mg/dL (ref 8–23)
CO2: 24 mmol/L (ref 22–32)
Calcium: 9.4 mg/dL (ref 8.9–10.3)
Chloride: 95 mmol/L — ABNORMAL LOW (ref 98–111)
Creatinine: 0.62 mg/dL (ref 0.44–1.00)
GFR, Estimated: >60 mL/min (ref >60)
Glucose, Bld: 198 mg/dL — ABNORMAL HIGH (ref 70–99)
Potassium: 3.3 mmol/L — ABNORMAL LOW (ref 3.5–5.1)
Sodium: 128 mmol/L — ABNORMAL LOW (ref 135–145)
Total Bilirubin: 0.4 mg/dL (ref 0.3–1.2)
Total Protein: 6.4 g/dL — ABNORMAL LOW (ref 6.5–8.1)

## 2020-02-17 LAB — CBC WITH DIFFERENTIAL (CANCER CENTER ONLY)
Abs Immature Granulocytes: 0.07 10*3/uL (ref 0.00–0.07)
Basophils Absolute: 0 10*3/uL (ref 0.0–0.1)
Basophils Relative: 0 %
Eosinophils Absolute: 0.1 10*3/uL (ref 0.0–0.5)
Eosinophils Relative: 1 %
HCT: 30.5 % — ABNORMAL LOW (ref 36.0–46.0)
Hemoglobin: 10 g/dL — ABNORMAL LOW (ref 12.0–15.0)
Immature Granulocytes: 1 %
Lymphocytes Relative: 12 %
Lymphs Abs: 1.1 10*3/uL (ref 0.7–4.0)
MCH: 29.5 pg (ref 26.0–34.0)
MCHC: 32.8 g/dL (ref 30.0–36.0)
MCV: 90 fL (ref 80.0–100.0)
Monocytes Absolute: 0.9 10*3/uL (ref 0.1–1.0)
Monocytes Relative: 9 %
Neutro Abs: 7.2 10*3/uL (ref 1.7–7.7)
Neutrophils Relative %: 77 %
Platelet Count: 267 10*3/uL (ref 150–400)
RBC: 3.39 MIL/uL — ABNORMAL LOW (ref 3.87–5.11)
RDW: 16.7 % — ABNORMAL HIGH (ref 11.5–15.5)
WBC Count: 9.3 10*3/uL (ref 4.0–10.5)
nRBC: 0 % (ref 0.0–0.2)

## 2020-02-17 LAB — LACTATE DEHYDROGENASE: LDH: 181 U/L (ref 98–192)

## 2020-02-17 MED ORDER — SODIUM CHLORIDE 0.9 % IV SOLN
150.0000 mg | Freq: Once | INTRAVENOUS | Status: AC
  Administered 2020-02-17: 150 mg via INTRAVENOUS
  Filled 2020-02-17: qty 150

## 2020-02-17 MED ORDER — SODIUM CHLORIDE 0.9% FLUSH
10.0000 mL | INTRAVENOUS | Status: DC | PRN
  Administered 2020-02-17: 10 mL
  Filled 2020-02-17: qty 10

## 2020-02-17 MED ORDER — PALONOSETRON HCL INJECTION 0.25 MG/5ML
INTRAVENOUS | Status: AC
  Filled 2020-02-17: qty 5

## 2020-02-17 MED ORDER — SODIUM CHLORIDE 0.9 % IV SOLN
10.0000 mg | Freq: Once | INTRAVENOUS | Status: AC
  Administered 2020-02-17: 10 mg via INTRAVENOUS
  Filled 2020-02-17: qty 10

## 2020-02-17 MED ORDER — HEPARIN SOD (PORK) LOCK FLUSH 100 UNIT/ML IV SOLN
500.0000 [IU] | Freq: Once | INTRAVENOUS | Status: AC | PRN
  Administered 2020-02-17: 500 [IU]
  Filled 2020-02-17: qty 5

## 2020-02-17 MED ORDER — SODIUM CHLORIDE 0.9 % IV SOLN
Freq: Once | INTRAVENOUS | Status: AC
  Filled 2020-02-17: qty 250

## 2020-02-17 MED ORDER — SODIUM CHLORIDE 0.9 % IV SOLN
401.4000 mg | Freq: Once | INTRAVENOUS | Status: AC
Start: 2020-02-17 — End: 2020-02-17
  Administered 2020-02-17: 400 mg via INTRAVENOUS
  Filled 2020-02-17: qty 40

## 2020-02-17 MED ORDER — PALONOSETRON HCL INJECTION 0.25 MG/5ML
0.2500 mg | Freq: Once | INTRAVENOUS | Status: AC
  Administered 2020-02-17: 0.25 mg via INTRAVENOUS

## 2020-02-17 MED ORDER — SODIUM CHLORIDE 0.9 % IV SOLN
15.0000 mg/kg | Freq: Once | INTRAVENOUS | Status: AC
  Administered 2020-02-17: 900 mg via INTRAVENOUS
  Filled 2020-02-17: qty 32

## 2020-02-17 NOTE — Patient Instructions (Signed)
[facility] Discharge Instructions for Patients Receiving Chemotherapy  Today you received the following chemotherapy agents Carboplatin and Bevacizumab-xxxx  To help prevent nausea and vomiting after your treatment, we encourage you to take your nausea medication as prescribed by MD. **DO NOT TAKE ZOFRAN FOR 3 DAYS AFTER CHEMOTHERAPY**   If you develop nausea and vomiting that is not controlled by your nausea medication, call the clinic.   BELOW ARE SYMPTOMS THAT SHOULD BE REPORTED IMMEDIATELY:  *FEVER GREATER THAN 100.5 F  *CHILLS WITH OR WITHOUT FEVER  NAUSEA AND VOMITING THAT IS NOT CONTROLLED WITH YOUR NAUSEA MEDICATION  *UNUSUAL SHORTNESS OF BREATH  *UNUSUAL BRUISING OR BLEEDING  TENDERNESS IN MOUTH AND THROAT WITH OR WITHOUT PRESENCE OF ULCERS  *URINARY PROBLEMS  *BOWEL PROBLEMS  UNUSUAL RASH Items with * indicate a potential emergency and should be followed up as soon as possible.  Feel free to call the clinic should you have any questions or concerns. The clinic phone number is [phone].  Please show the [facility] at check-in to the Emergency Department and triage nurse.

## 2020-02-17 NOTE — Telephone Encounter (Signed)
Scheduled appt per 1/12 sch msg - left message with appt date and time

## 2020-02-17 NOTE — Patient Instructions (Signed)

## 2020-02-17 NOTE — Progress Notes (Signed)
Hematology and Oncology Follow Up Visit  Katie [name] [id number] [birth year] 80 y.o. 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 daily  Carboplatin/Taxotere/Avastin -- s/p cycle #1  -- start on 01/27/2020 --  taxotere omitted for cycle #2/3     Interim History:  Katie [name] is back for her follow-up.  Unfortunately, she really had a very tough time with chemotherapy with the first cycle.  I think was the Taxotere that caused the problems.  She was admitted to the hospital.  She had bad mouth sores.  She had bad diarrhea.  She tested negative for Clostridium.  She has had no issues with bleeding.  There is no problems with shortness of breath.  I do think that the protocol did help with respect to the malignancy.  However, given her maturity, I do not think that we can give the Taxotere right now.  I will hold the Taxotere for this cycle and the next cycle.  We did do a liquid biopsy on her.  Unfortunately, there was really nothing that came back that we could utilize for targeted therapy or immunotherapy.    Her weight is gone down.  Her appetite has gone down.  Again this is getting a little bit better now that the mouth sores are gone.  She did have some records from when she was treated 11 years ago.  She did get carboplatinum and Taxotere at that time.  I just think that the increase in her age now has affected her Taxotere metabolism.  She does say that the neuropathy might be a little bit better.  She has had no headache.  Overall, I would say performance status is ECOG 1.      Medications:  Current Outpatient Medications:  .  acetaminophen (TYLENOL) 500 MG tablet, Take 1,000 mg by mouth daily. At bedtime., Disp: , Rfl:  .  bimatoprost (LUMIGAN) 0.01 % SOLN, Place 1 drop into both eyes at bedtime. Can not take Generic,  Disp: , Rfl:  .  brimonidine (ALPHAGAN P) 0.1 % SOLN, Place 1 drop into both eyes 2 (two) times daily. Can not take generic, Disp: , Rfl:  .  cholecalciferol (VITAMIN D) 25 MCG (1000 UNIT) tablet, Take 3,000 Units by mouth daily. , Disp: , Rfl:  .  cholestyramine (QUESTRAN) 4 g packet, Take 4 g by mouth as directed. Take 1 packet at bedtime but can Take daily as needed for constipation, Disp: , Rfl:  .  esomeprazole (NEXIUM) 20 MG capsule, Take 20 mg by mouth daily at 12 noon., Disp: , Rfl:  .  levothyroxine (SYNTHROID) 75 MCG tablet, TAKE 1 TABLET(75 MCG) BY MOUTH DAILY (Patient taking differently: Take 75 mcg by mouth daily before breakfast.), Disp: 90 tablet, Rfl: 3 .  loratadine (CLARITIN) 10 MG tablet, Take 10 mg by mouth daily., Disp: , Rfl:  .  magic mouthwash SOLN, Take 5 mLs by mouth 4 (four) times daily., Disp: 15 mL, Rfl: 0 .  megestrol (MEGACE) 40 MG tablet, Take 4 tablets (160 mg total) by mouth daily., Disp: 120 tablet, Rfl: 1 .  metoprolol tartrate (LOPRESSOR) 25 MG tablet, Take 0.5 tablets (12.5 mg total) by mouth 2 (two) times daily., Disp: 90 tablet, Rfl: 1 .  ondansetron (ZOFRAN) 8 MG tablet, Take 1 tablet (8 mg total) by mouth 2 (two) times daily as needed for refractory nausea /  vomiting. Start on day 3 after chemo., Disp: 30 tablet, Rfl: 1 .  vitamin B-12 (CYANOCOBALAMIN) 1000 MCG tablet, Take 1,000 mcg by mouth daily., Disp: , Rfl:  .  docusate sodium (COLACE) 100 MG capsule, Take 1 capsule (100 mg total) by mouth daily. (Patient not taking: Reported on 02/17/2020), Disp: 10 capsule, Rfl: 0 .  famciclovir (FAMVIR) 500 MG tablet, Take 1 tablet (500 mg total) by mouth daily. (Patient not taking: Reported on 02/17/2020), Disp: 30 tablet, Rfl: 3 .  prochlorperazine (COMPAZINE) 10 MG tablet, Take 1 tablet (10 mg total) by mouth every 6 (six) hours as needed (Nausea or vomiting). (Patient not taking: Reported on 02/17/2020), Disp: 30 tablet, Rfl: 1  Allergies:  Allergies  Allergen  Reactions  . Sulfa Antibiotics Rash  . Gabapentin Other (See Comments)    Depressed   . Lyrica [Pregabalin] Other (See Comments)    unknown  . Paxil [Paroxetine]     unknown    Past Medical History, Surgical history, Social history, and Family History were reviewed and updated.  Review of Systems: Review of Systems  Constitutional: Positive for fatigue and unexpected weight change.  HENT:   Positive for trouble swallowing.   Eyes: Negative.   Respiratory: Positive for chest tightness and shortness of breath.   Cardiovascular: Negative.   Gastrointestinal: Negative.   Endocrine: Negative.   Genitourinary: Negative.    Musculoskeletal: Negative.   Skin: Negative.   Neurological: Negative.   Hematological: Negative.   Psychiatric/Behavioral: Negative.     Physical Exam:  weight is 125 lb 9.6 oz (57 kg). Her oral temperature is 98 F (36.7 C). Her blood pressure is 146/76 (abnormal) and her pulse is 79. Her respiration is 18 and oxygen saturation is 100%.   Wt Readings from Last 3 Encounters:  02/17/20 125 lb 9.6 oz (57 kg)  02/17/20 128 lb 6.4 oz (58.2 kg)  02/06/20 129 lb 6.6 oz (58.7 kg)    Physical Exam Vitals reviewed.  HENT:     Head: Normocephalic and atraumatic.  Eyes:     Pupils: Pupils are equal, round, and reactive to light.  Cardiovascular:     Rate and Rhythm: Normal rate and regular rhythm.     Heart sounds: Normal heart sounds.  Pulmonary:     Effort: Pulmonary effort is normal.     Breath sounds: Normal breath sounds.  Abdominal:     General: Bowel sounds are normal.     Palpations: Abdomen is soft.  Musculoskeletal:        General: No tenderness or deformity. Normal range of motion.     Cervical back: Normal range of motion.  Lymphadenopathy:     Cervical: No cervical adenopathy.  Skin:    General: Skin is warm and dry.     Findings: No erythema or rash.  Neurological:     Mental Status: She is alert and oriented to person, place, and time.   Psychiatric:        Behavior: Behavior normal.        Thought Content: Thought content normal.        Judgment: Judgment normal.      Lab Results  Component Value Date   WBC 9.3 02/17/2020   HGB 10.0 (L) 02/17/2020   HCT 30.5 (L) 02/17/2020   MCV 90.0 02/17/2020   PLT 267 02/17/2020     Chemistry      Component Value Date/Time   NA 128 (L) 02/17/2020 0838   K 3.3 (L)  02/17/2020 0838   CL 95 (L) 02/17/2020 0838   CO2 24 02/17/2020 0838   BUN 11 02/17/2020 0838   CREATININE 0.62 02/17/2020 0838   CREATININE 0.72 01/08/2020 1151      Component Value Date/Time   CALCIUM 9.4 02/17/2020 0838   ALKPHOS 46 02/17/2020 0838   AST 13 (L) 02/17/2020 0838   ALT 10 02/17/2020 0838   BILITOT 0.4 02/17/2020 0838      Impression and Plan:   Katie [name] is a very charming 80 year old white female.  She has recurrent uterine cancer.  This is metastatic with most of the disease with her lungs.  She had her first cycle of chemotherapy.  Again, we will hold the Taxotere this cycle and the third cycle.  I will repeat her scans after the third cycle.  I have to believe that everything will be improved.    It will be interesting to see what the CA 125 will be now.  Previously, it was 426.    I will still give her Neulasta after this cycle of treatment.  We will plan to get her back to see [place] in 3 weeks for her third cycle.   Volanda Napoleon, MD 1/12/20229:45 AM

## 2020-02-17 NOTE — Telephone Encounter (Signed)
appts made per 02/17/20 los and pt will rec sch in tx/avs    aom

## 2020-02-17 NOTE — Progress Notes (Signed)
Patient has lost weight since her last treatment and hospitalization. I will place a referral to the dietician.   Oncology Nurse Navigator Documentation  Oncology Nurse Navigator Flowsheets 02/17/2020  Abnormal Finding Date -  Confirmed Diagnosis Date -  Phase of Treatment -  Chemotherapy Actual Start Date: -  Navigator Follow Up Date: 03/08/2020  Navigator Follow Up Reason: Follow-up Appointment;Chemotherapy  Navigator Location [facility]  Navigator Encounter Type Treatment;Appt/Treatment Plan Review  Telephone -  Treatment Initiated Date -  Patient Visit Type MedOnc  Treatment Phase Active Tx  Barriers/Navigation Needs Coordination of Care;Education  Education -  Interventions Psycho-Social Support;Referrals  Acuity Level 2-Minimal Needs (1-2 Barriers Identified)  Referrals Nutrition/dietician  Coordination of Care -  Education Method -  Support Groups/Services Friends and Family  Time Spent with Patient 30

## 2020-02-18 ENCOUNTER — Encounter: Payer: Self-pay | Admitting: *Deleted

## 2020-02-18 ENCOUNTER — Inpatient Hospital Stay: Payer: Medicare Other

## 2020-02-18 VITALS — BP 166/76 | HR 85 | Temp 97.7°F | Resp 19

## 2020-02-18 DIAGNOSIS — Z5189 Encounter for other specified aftercare: Secondary | ICD-10-CM | POA: Diagnosis not present

## 2020-02-18 DIAGNOSIS — C541 Malignant neoplasm of endometrium: Secondary | ICD-10-CM

## 2020-02-18 DIAGNOSIS — J910 Malignant pleural effusion: Secondary | ICD-10-CM | POA: Diagnosis not present

## 2020-02-18 DIAGNOSIS — Z5112 Encounter for antineoplastic immunotherapy: Secondary | ICD-10-CM | POA: Diagnosis not present

## 2020-02-18 DIAGNOSIS — Z5111 Encounter for antineoplastic chemotherapy: Secondary | ICD-10-CM | POA: Diagnosis not present

## 2020-02-18 DIAGNOSIS — C7800 Secondary malignant neoplasm of unspecified lung: Secondary | ICD-10-CM | POA: Diagnosis not present

## 2020-02-18 LAB — CA 125: Cancer Antigen (CA) 125: 59.5 U/mL — ABNORMAL HIGH (ref 0.0–38.1)

## 2020-02-18 MED ORDER — PEGFILGRASTIM-JMDB 6 MG/0.6ML ~~LOC~~ SOSY
6.0000 mg | PREFILLED_SYRINGE | Freq: Once | SUBCUTANEOUS | Status: AC
  Administered 2020-02-18: 6 mg via SUBCUTANEOUS

## 2020-02-18 MED ORDER — PEGFILGRASTIM-JMDB 6 MG/0.6ML ~~LOC~~ SOSY
PREFILLED_SYRINGE | SUBCUTANEOUS | Status: AC
  Filled 2020-02-18: qty 0.6

## 2020-02-18 NOTE — Patient Instructions (Signed)
Pegfilgrastim injection What is this medicine? PEGFILGRASTIM (PEG fil gra stim) is a long-acting granulocyte colony-stimulating factor that stimulates the growth of neutrophils, a type of white blood cell important in the body's fight against infection. It is used to reduce the incidence of fever and infection in patients with certain types of cancer who are receiving chemotherapy that affects the bone marrow, and to increase survival after being exposed to high doses of radiation. This medicine may be used for other purposes; ask your health care provider or pharmacist if you have questions. COMMON BRAND NAME(S): Fulphila, Neulasta, Nyvepria, UDENYCA, Ziextenzo What should I tell my health care provider before I take this medicine? They need to know if you have any of these conditions:  kidney disease  latex allergy  ongoing radiation therapy  sickle cell disease  skin reactions to acrylic adhesives (On-Body Injector only)  an unusual or allergic reaction to pegfilgrastim, filgrastim, other medicines, foods, dyes, or preservatives  pregnant or trying to get pregnant  breast-feeding How should I use this medicine? This medicine is for injection under the skin. If you get this medicine at home, you will be taught how to prepare and give the pre-filled syringe or how to use the On-body Injector. Refer to the patient Instructions for Use for detailed instructions. Use exactly as directed. Tell your healthcare provider immediately if you suspect that the On-body Injector may not have performed as intended or if you suspect the use of the On-body Injector resulted in a missed or partial dose. It is important that you put your used needles and syringes in a special sharps container. Do not put them in a trash can. If you do not have a sharps container, call your pharmacist or healthcare provider to get one. Talk to your pediatrician regarding the use of this medicine in children. While this drug  may be prescribed for selected conditions, precautions do apply. Overdosage: If you think you have taken too much of this medicine contact a poison control center or emergency room at once. NOTE: This medicine is only for you. Do not share this medicine with others. What if I miss a dose? It is important not to miss your dose. Call your doctor or health care professional if you miss your dose. If you miss a dose due to an On-body Injector failure or leakage, a new dose should be administered as soon as possible using a single prefilled syringe for manual use. What may interact with this medicine? Interactions have not been studied. This list may not describe all possible interactions. Give your health care provider a list of all the medicines, herbs, non-prescription drugs, or dietary supplements you use. Also tell them if you smoke, drink alcohol, or use illegal drugs. Some items may interact with your medicine. What should I watch for while using this medicine? Your condition will be monitored carefully while you are receiving this medicine. You may need blood work done while you are taking this medicine. Talk to your health care provider about your risk of cancer. You may be more at risk for certain types of cancer if you take this medicine. If you are going to need a MRI, CT scan, or other procedure, tell your doctor that you are using this medicine (On-Body Injector only). What side effects may I notice from receiving this medicine? Side effects that you should report to your doctor or health care professional as soon as possible:  allergic reactions (skin rash, itching or hives, swelling of   the face, lips, or tongue)  back pain  dizziness  fever  pain, redness, or irritation at site where injected  pinpoint red spots on the skin  red or dark-brown urine  shortness of breath or breathing problems  stomach or side pain, or pain at the shoulder  swelling  tiredness  trouble  passing urine or change in the amount of urine  unusual bruising or bleeding Side effects that usually do not require medical attention (report to your doctor or health care professional if they continue or are bothersome):  bone pain  muscle pain This list may not describe all possible side effects. Call your doctor for medical advice about side effects. You may report side effects to FDA at 1-800-FDA-1088. Where should I keep my medicine? Keep out of the reach of children. If you are using this medicine at home, you will be instructed on how to store it. Throw away any unused medicine after the expiration date on the label. NOTE: This sheet is a summary. It may not cover all possible information. If you have questions about this medicine, talk to your doctor, pharmacist, or health care provider.  2021 Elsevier/Gold Standard (2019-02-13 13:20:51)  

## 2020-02-19 DIAGNOSIS — R404 Transient alteration of awareness: Secondary | ICD-10-CM | POA: Diagnosis not present

## 2020-02-19 DIAGNOSIS — R059 Cough, unspecified: Secondary | ICD-10-CM | POA: Diagnosis not present

## 2020-02-22 ENCOUNTER — Encounter: Payer: Self-pay | Admitting: *Deleted

## 2020-02-22 NOTE — Progress Notes (Signed)
Received a message from the patient's daughter, Tammi Klippel. She states patient passed away suddenly on 2020/02/27. She would like a call back.  Called and spoke to [place]. Patient had a good few days after treatment and had very little complaints. She was doing well until Friday afternoon, when she began to cough, and vomited a large volume of bright red blood. Bobbi, a PA, stated it appeared that patient had a rapid arterial bleed and within second of vomiting passed away. Patient was a DNR.   She received a voice mail from Dr Marin Olp with his condolences, but she requests he call her again so that she may speak to him personally. That message was given to Dr Antonieta Pert desk RN.   Oncology Nurse Navigator Documentation  Oncology Nurse Navigator Flowsheets 02/22/2020  Abnormal Finding Date -  Confirmed Diagnosis Date -  Phase of Treatment -  Chemotherapy Actual Start Date: -  Navigator Follow Up Date: -  Navigator Follow Up Reason: -  Navigation Complete Date: 02/22/2020  Post Navigation: Continue to Follow Patient? No  Reason Not Navigating Patient: Airline pilot Encounter Type Telephone  Telephone Incoming Call;Patient Update  Treatment Initiated Date -  Patient Visit Type MedOnc  Treatment Phase Active Tx  Barriers/Navigation Needs Coordination of Care;Education  Education -  Interventions Psycho-Social Support  Acuity Level 2-Minimal Needs (1-2 Barriers Identified)  Referrals -  Coordination of Care -  Education Method -  Support Groups/Services Friends and Family  Time Spent with Patient 16

## 2020-02-23 ENCOUNTER — Other Ambulatory Visit: Payer: Self-pay | Admitting: Gastroenterology

## 2020-02-23 ENCOUNTER — Other Ambulatory Visit: Payer: Self-pay | Admitting: Family Medicine

## 2020-02-23 DIAGNOSIS — E039 Hypothyroidism, unspecified: Secondary | ICD-10-CM

## 2020-02-25 ENCOUNTER — Encounter: Payer: 59 | Admitting: Nutrition

## 2020-03-08 ENCOUNTER — Other Ambulatory Visit: Payer: 59

## 2020-03-08 ENCOUNTER — Ambulatory Visit: Payer: 59

## 2020-03-08 ENCOUNTER — Ambulatory Visit: Payer: 59 | Admitting: Hematology & Oncology

## 2020-03-08 DIAGNOSIS — 419620001 Death: Secondary | SNOMED CT | POA: Diagnosis not present

## 2020-03-08 DEATH — deceased

## 2020-03-09 ENCOUNTER — Ambulatory Visit: Payer: 59

## 2020-04-07 ENCOUNTER — Ambulatory Visit: Payer: Medicare Other | Admitting: Nurse Practitioner

## 2020-04-08 ENCOUNTER — Ambulatory Visit: Payer: Medicare Other | Admitting: Nurse Practitioner

## 2022-08-03 IMAGING — DX DG CHEST 1V PORT
1 series · 1 of 1 positions shown · non-contrast
Comparison: Radiograph 06/27/2019

CLINICAL DATA: Inability to eat, possible stricture, lack of
intake, history of endometrial cancer

EXAM:
PORTABLE CHEST 1 VIEW

[chest ap]
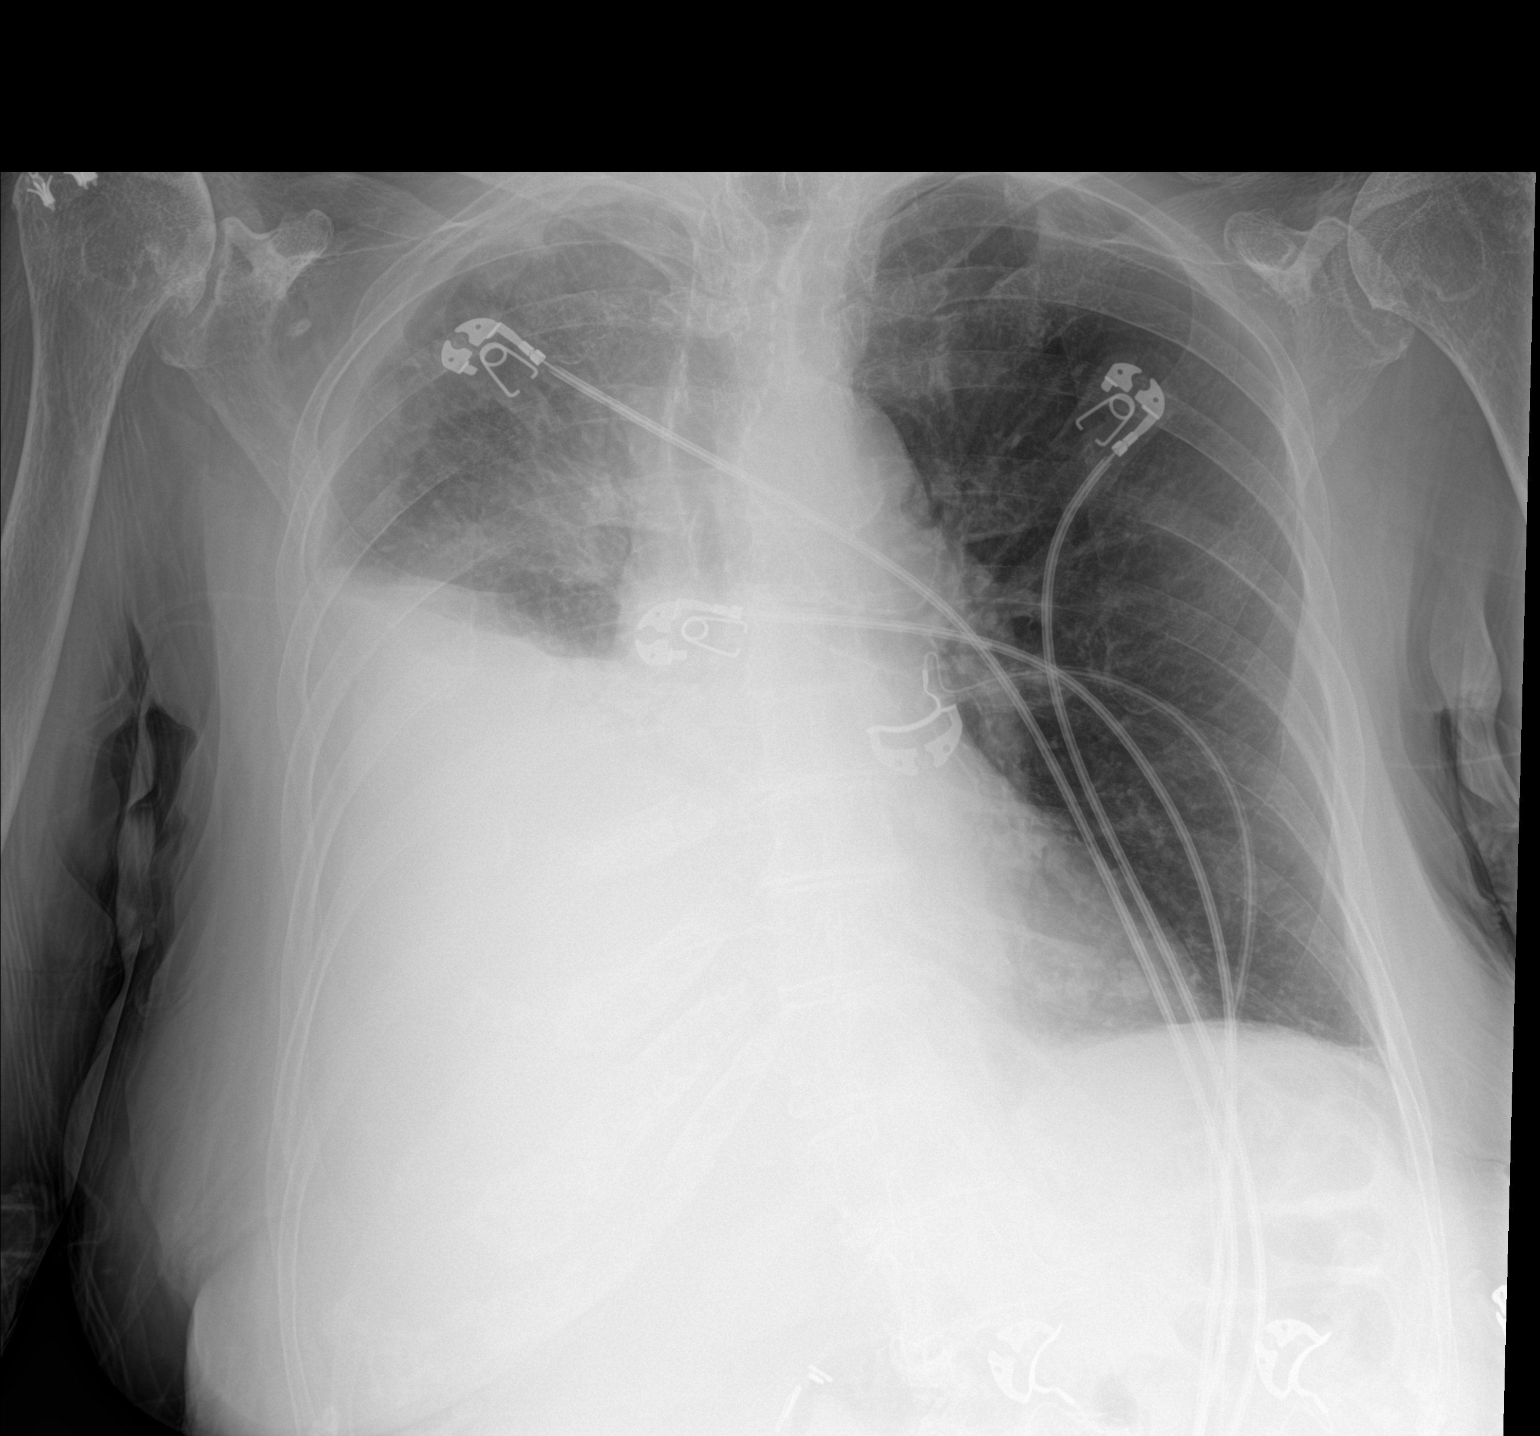

[1 of 1 positions shown; findings below may reference images not displayed]

FINDINGS: New moderate to large right pleural effusion with fluid tracking
across the lung apex as well. Adjacent opacity likely reflecting
some passive atelectatic changes though underlying airspace disease
is difficult to fully exclude. Left lung is predominantly clear.
Right cardiac margin is largely obscured by opacity. Visible
cardiomediastinal contours are unremarkable with a calcified aorta.
The osseous structures appear diffusely demineralized which may
limit detection of small or nondisplaced fractures. Degenerative
changes are present in the imaged spine and shoulders. High-riding
appearance of the humeral heads bilaterally compatible with sequela
of rotator cuff insufficiency with evidence of prior right shoulder
rotator cuff repair.
IMPRESSION: 1. New moderate to large right pleural effusion with fluid tracking
across the lung apex. Adjacent opacity likely reflecting some
passive atelectatic changes though other underlying processes are
difficult to exclude.
2.  Aortic Atherosclerosis ([id number]).

## 2022-08-04 IMAGING — US US THORACENTESIS ASP PLEURAL SPACE W/IMG GUIDE
1 series · 3 of 3 positions shown · non-contrast
Comparison: none

INDICATION: Patient with remote history of endometrial cancer, weakness,
dysphagia, anemia, right pleural effusion. Request received for
diagnostic and therapeutic right thoracentesis.

[Series 1: us thoracentesis asp pleural space w/img guide · 3 of 3 slices shown]
[im 1/3]
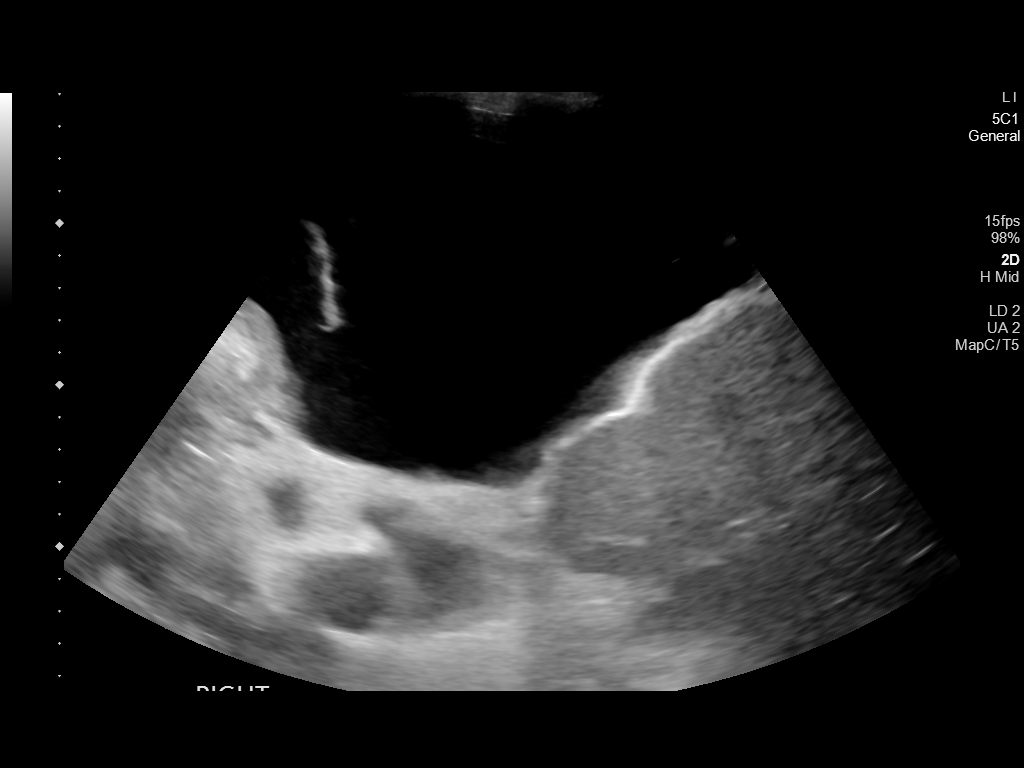
[im 2/3]
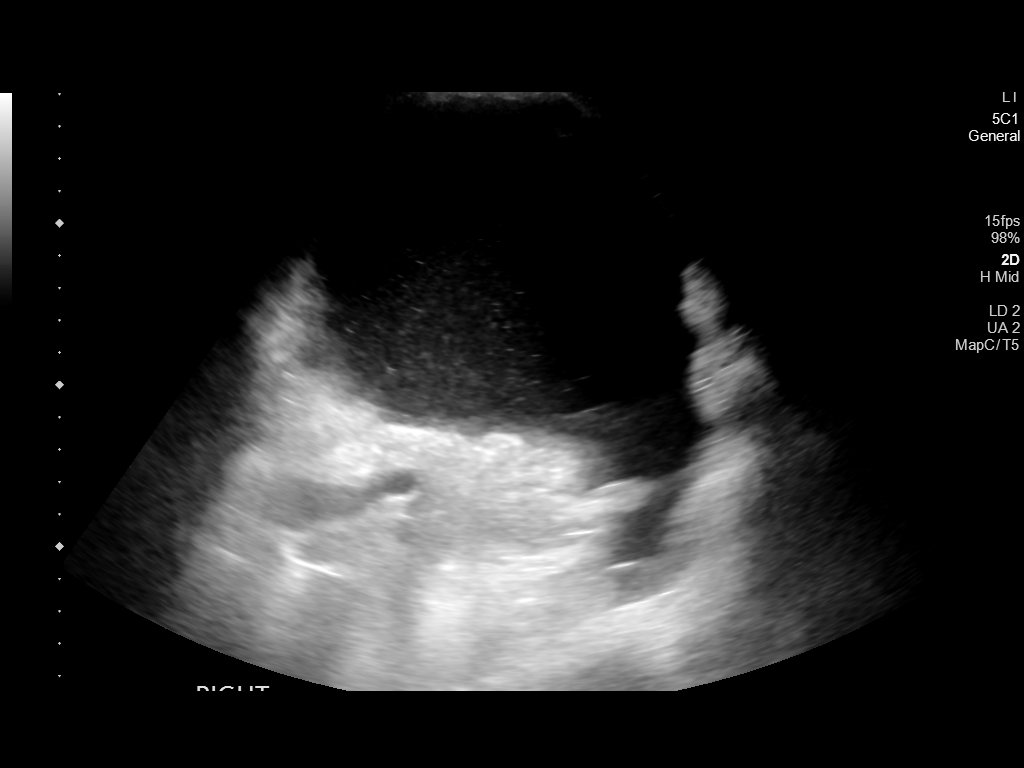
[im 3/3]
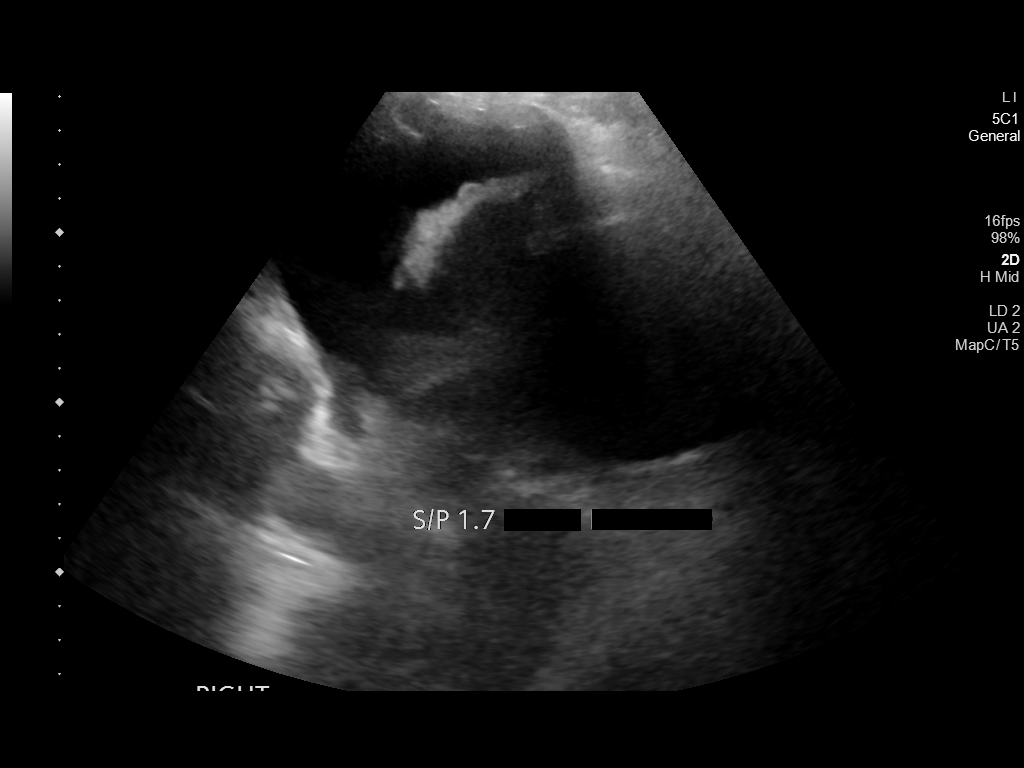

[3 of 3 positions shown; findings below may reference images not displayed]

EXAM:
ULTRASOUND GUIDED DIAGNOSTIC AND THERAPEUTIC RIGHT THORACENTESIS

MEDICATIONS:
1% lidocaine to skin and subcutaneous tissue

COMPLICATIONS:
None immediate.

PROCEDURE:
An ultrasound guided thoracentesis was thoroughly discussed with the
patient and questions answered. The benefits, risks, alternatives
and complications were also discussed. The patient understands and
wishes to proceed with the procedure. Written consent was obtained.

Ultrasound was performed to localize and mark an adequate pocket of
fluid in the right chest. The area was then prepped and draped in
the normal sterile fashion. 1% Lidocaine was used for local
anesthesia. Under ultrasound guidance a 6 Fr Safe-T-Centesis
catheter was introduced. Thoracentesis was performed. The catheter
was removed and a dressing applied.
FINDINGS: A total of approximately 1.7 liters of bloody fluid was removed.
Samples were sent to the laboratory as requested by the clinical
team. Primary THAIS notified of above.
IMPRESSION: Successful ultrasound guided diagnostic and therapeutic right
thoracentesis yielding 1.7 liters of pleural fluid.

## 2022-08-10 IMAGING — DX DG CHEST 1V PORT
1 series · 1 of 1 positions shown · non-contrast
Comparison: Yesterday.

CLINICAL DATA: Status post bronchoscopy.

EXAM:
PORTABLE CHEST 1 VIEW

[chest ap]
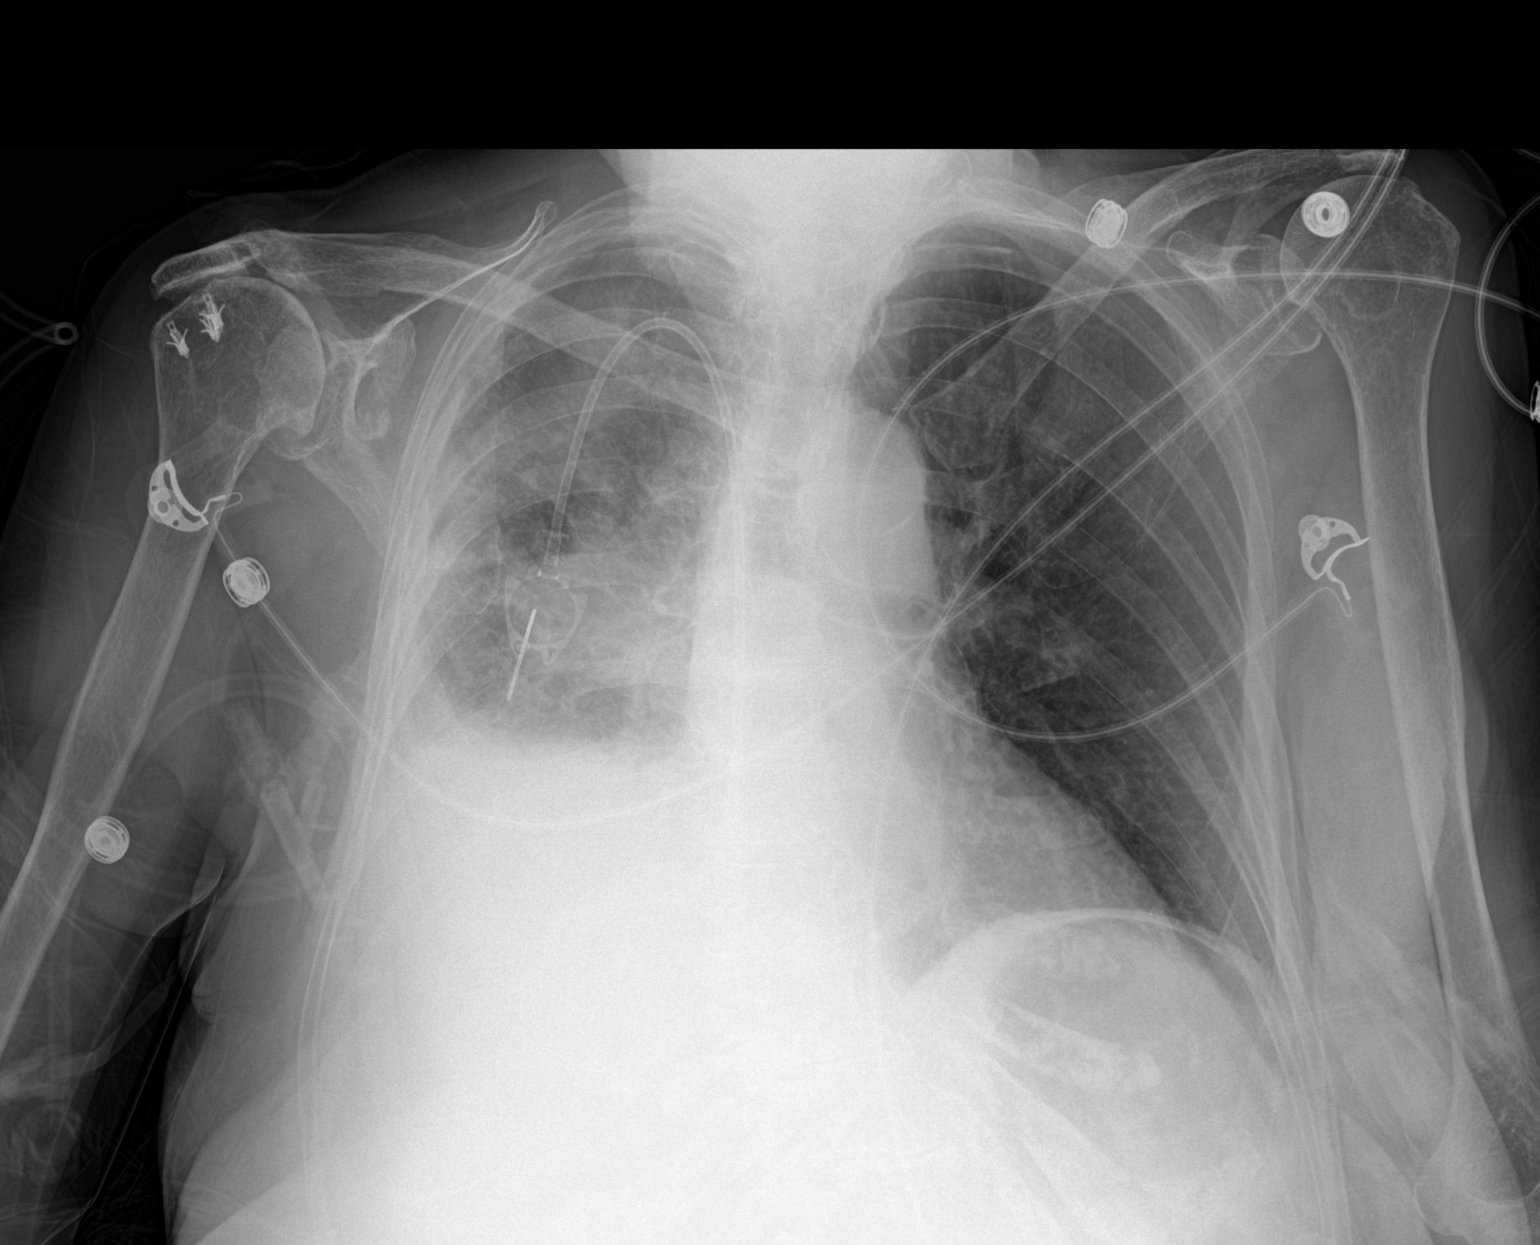

[1 of 1 positions shown; findings below may reference images not displayed]

FINDINGS: Interval right jugular porta catheter with its tip in the upper
right atrium. No pneumothorax. Normal sized heart. Tortuous and
partially calcified thoracic aorta. Moderately large right pleural
effusion, mildly increased. Air oval mild right basilar atelectasis.
Diffuse osteopenia. Right shoulder degenerative changes and fixation
anchors.
IMPRESSION: 1. No pneumothorax following bronchoscopy.
2. Moderately large right pleural effusion, mildly increased.
3. Mild right basilar atelectasis.

## 2022-08-11 IMAGING — US US THORACENTESIS ASP PLEURAL SPACE W/IMG GUIDE
1 series · 5 of 5 positions shown · non-contrast
Comparison: none

INDICATION: Patient with history of endometrial cancer, presents with lung mass
and pleural effusion. Request is made for therapeutic thoracentesis.

[Series 1: us thoracentesis asp pleural space w/img guide · 5 of 5 slices shown]
[im 1/5]
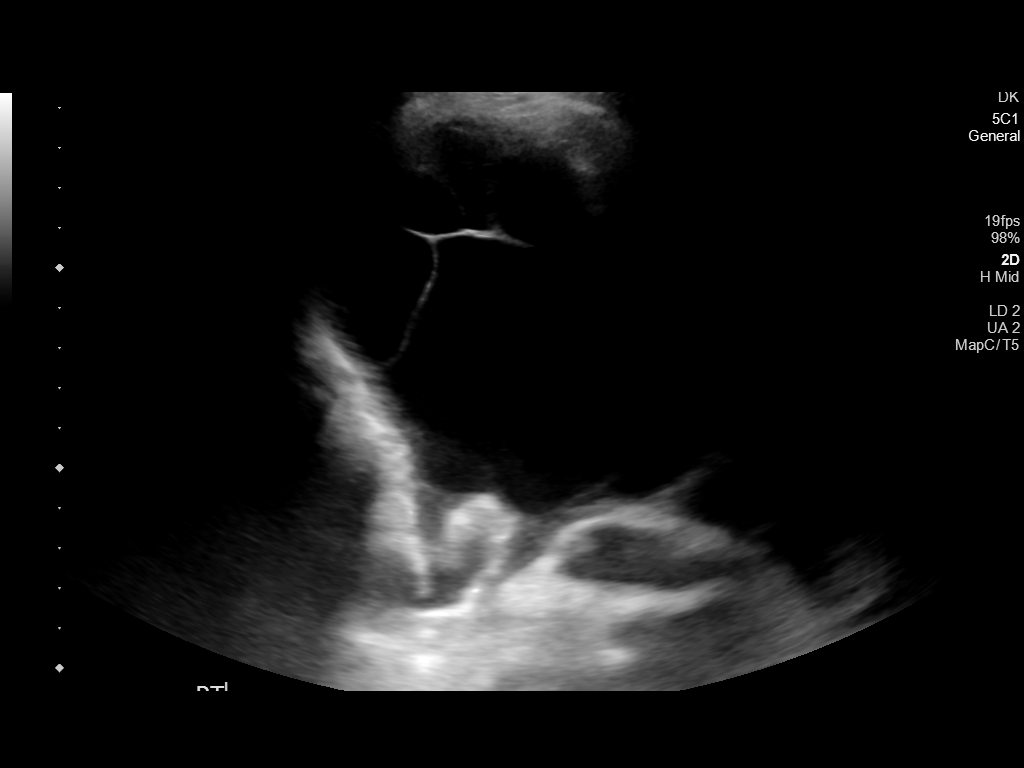
[im 2/5]
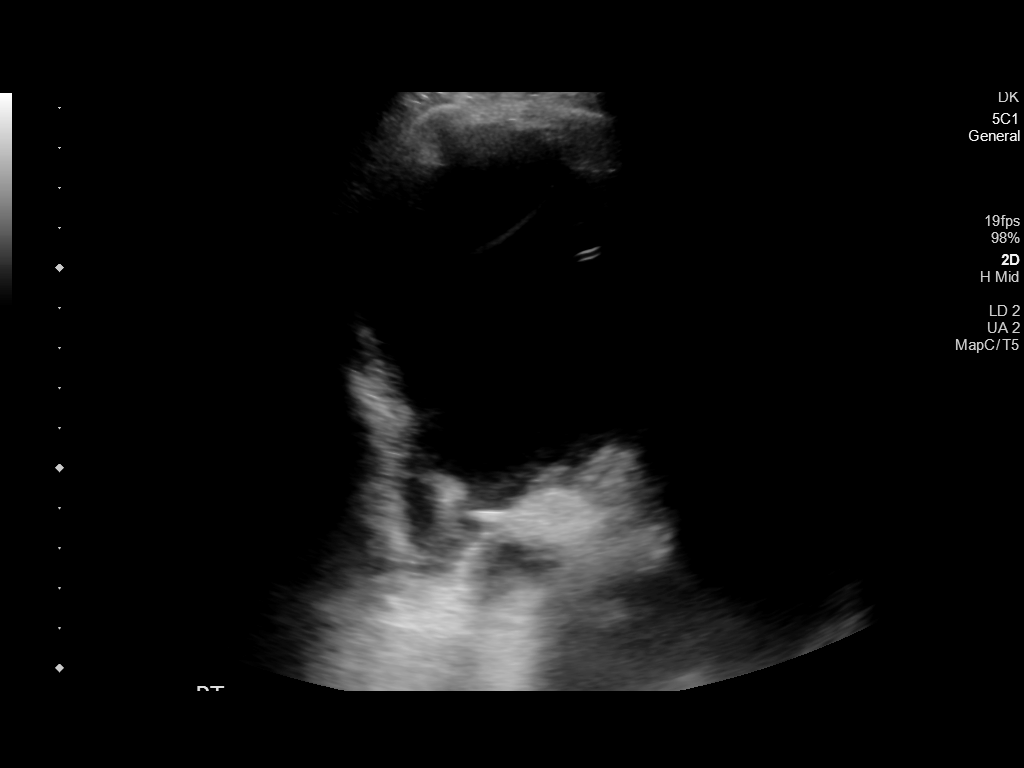
[im 3/5]
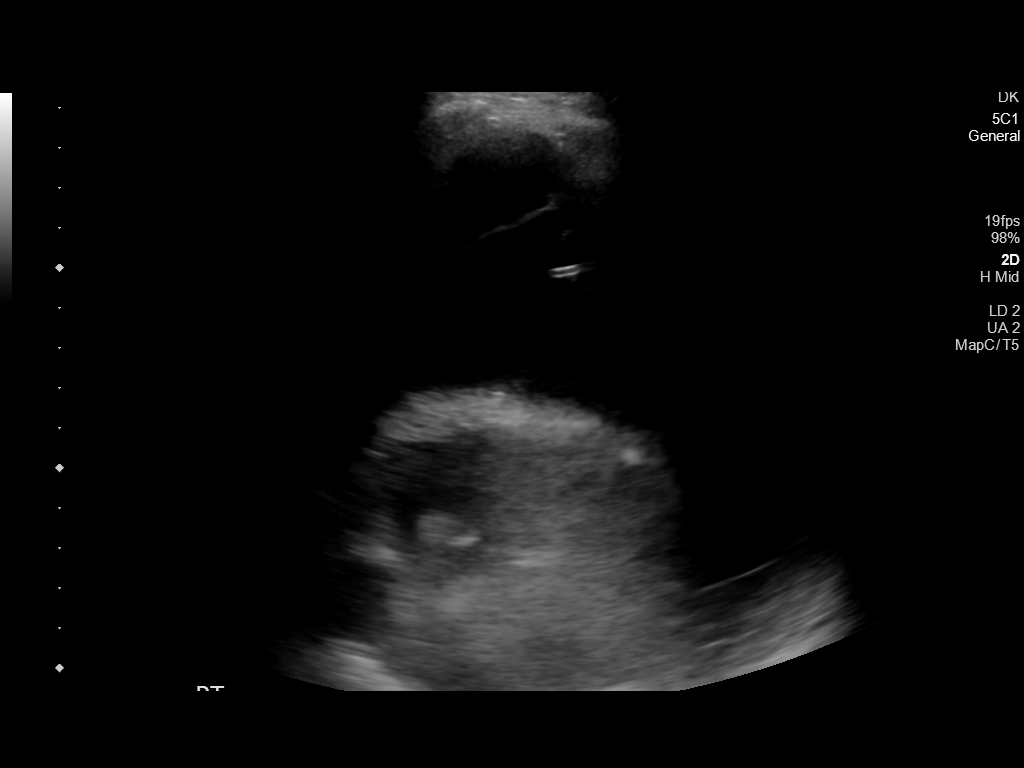
[im 4/5]
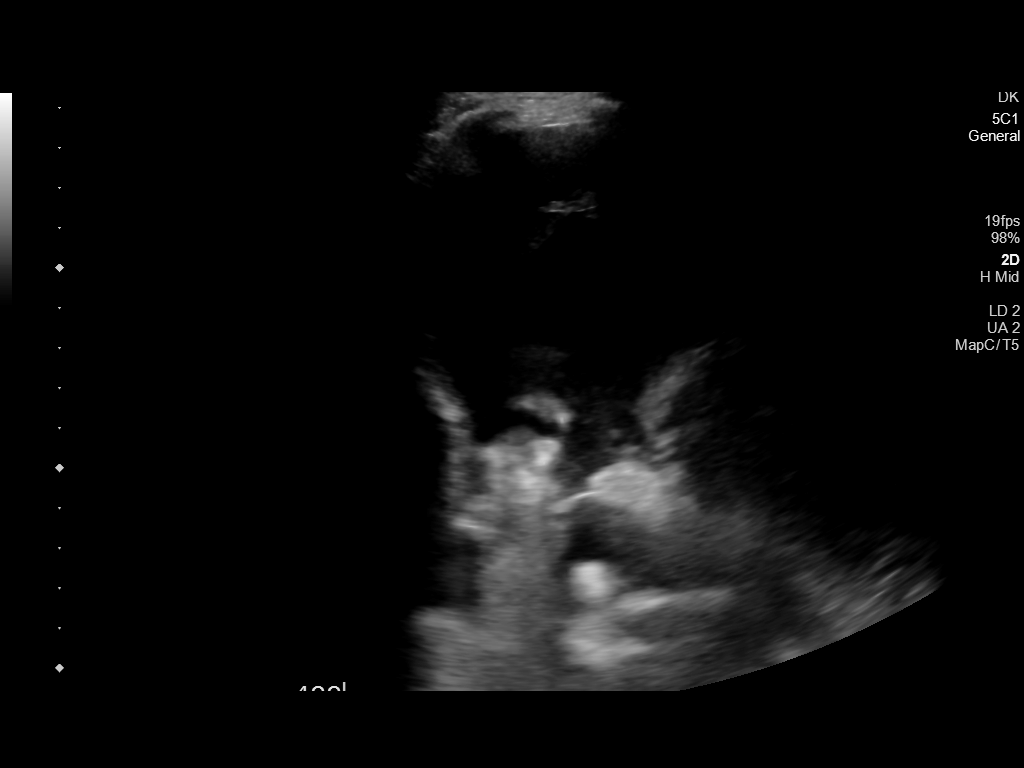
[im 5/5]
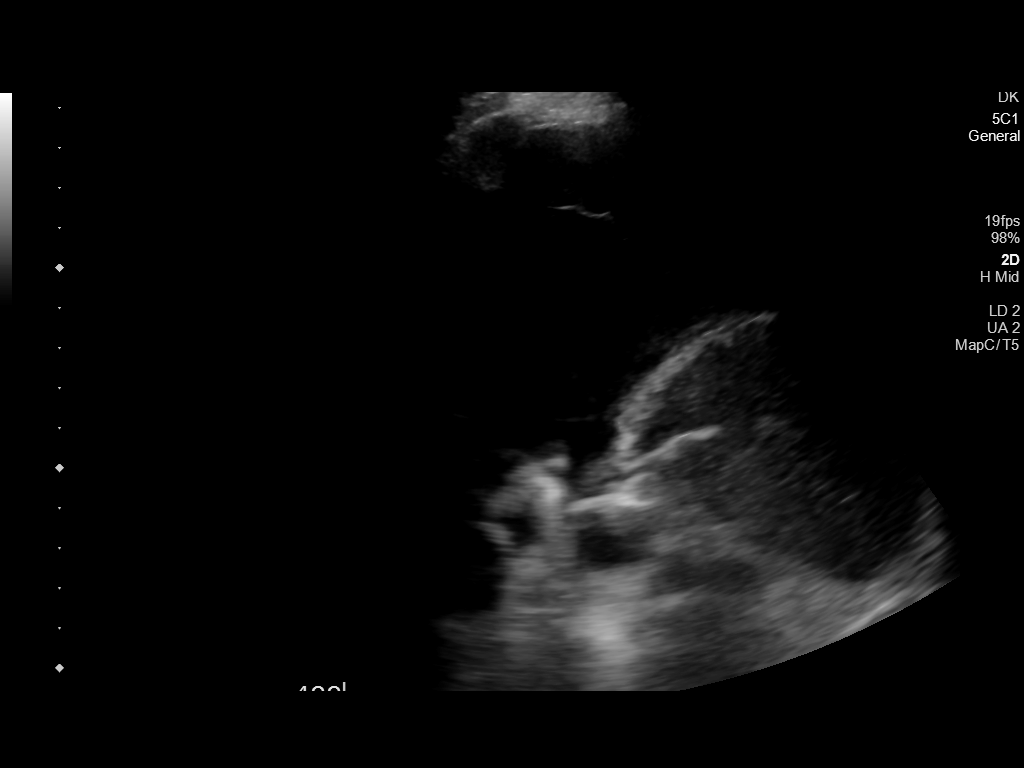

[5 of 5 positions shown; findings below may reference images not displayed]

EXAM:
ULTRASOUND GUIDED THERAPEUTIC RIGHT THORACENTESIS

MEDICATIONS:
10 mL 1% lidocaine

COMPLICATIONS:
None immediate.

PROCEDURE:
An ultrasound guided thoracentesis was thoroughly discussed with the
patient and questions answered. The benefits, risks, alternatives
and complications were also discussed. The patient understands and
wishes to proceed with the procedure. Written consent was obtained.

Ultrasound was performed to localize and mark an adequate pocket of
fluid in the right chest. The area was then prepped and draped in
the normal sterile fashion. 1% Lidocaine was used for local
anesthesia. Under ultrasound guidance a 6 Fr Safe-T-Centesis
catheter was introduced. Thoracentesis was performed. The catheter
was removed and a dressing applied.
FINDINGS: A total of approximately 400 mL of blood-tinged fluid was removed.
Samples were sent to the laboratory as requested by the clinical
team. Not all of the fluid was able to be removed due to the
presence of suspect adhesions.
IMPRESSION: Successful ultrasound guided right thoracentesis yielding 400 mL of
pleural fluid.
